# Patient Record
Sex: Male | Born: 1935 | Race: Black or African American | Hispanic: No | Marital: Married | Smoking: Former smoker
Health system: Southern US, Community
[De-identification: ages and names within clinical notes are randomized; demographics above are authoritative.]

## PROBLEM LIST (undated history)

## (undated) DIAGNOSIS — K649 Unspecified hemorrhoids: Secondary | ICD-10-CM

## (undated) DIAGNOSIS — Z8679 Personal history of other diseases of the circulatory system: Secondary | ICD-10-CM

## (undated) DIAGNOSIS — I10 Essential (primary) hypertension: Secondary | ICD-10-CM

## (undated) DIAGNOSIS — I480 Paroxysmal atrial fibrillation: Secondary | ICD-10-CM

## (undated) DIAGNOSIS — I499 Cardiac arrhythmia, unspecified: Secondary | ICD-10-CM

## (undated) DIAGNOSIS — N189 Chronic kidney disease, unspecified: Secondary | ICD-10-CM

## (undated) DIAGNOSIS — K219 Gastro-esophageal reflux disease without esophagitis: Secondary | ICD-10-CM

## (undated) DIAGNOSIS — E785 Hyperlipidemia, unspecified: Secondary | ICD-10-CM

## (undated) DIAGNOSIS — N400 Enlarged prostate without lower urinary tract symptoms: Secondary | ICD-10-CM

## (undated) DIAGNOSIS — F0390 Unspecified dementia without behavioral disturbance: Secondary | ICD-10-CM

## (undated) HISTORY — DX: Unspecified dementia, unspecified severity, without behavioral disturbance, psychotic disturbance, mood disturbance, and anxiety: F03.90

## (undated) HISTORY — DX: Paroxysmal atrial fibrillation: I48.0

## (undated) HISTORY — PX: SPINE SURGERY: SHX786

## (undated) HISTORY — PX: CERVICAL SPINE SURGERY: SHX589

## (undated) HISTORY — DX: Gastro-esophageal reflux disease without esophagitis: K21.9

## (undated) HISTORY — PX: BACK SURGERY: SHX140

## (undated) HISTORY — DX: Unspecified hemorrhoids: K64.9

## (undated) HISTORY — DX: Benign prostatic hyperplasia without lower urinary tract symptoms: N40.0

## (undated) HISTORY — DX: Hyperlipidemia, unspecified: E78.5

---

## 2001-09-17 ENCOUNTER — Encounter: Admission: RE | Admit: 2001-09-17 | Discharge: 2001-09-17 | Payer: Self-pay | Admitting: Neurosurgery

## 2001-09-17 ENCOUNTER — Encounter: Payer: Self-pay | Admitting: Neurosurgery

## 2008-02-25 ENCOUNTER — Ambulatory Visit (HOSPITAL_BASED_OUTPATIENT_CLINIC_OR_DEPARTMENT_OTHER): Admission: RE | Admit: 2008-02-25 | Discharge: 2008-02-25 | Payer: Self-pay | Admitting: Internal Medicine

## 2009-04-14 ENCOUNTER — Encounter: Payer: Self-pay | Admitting: Internal Medicine

## 2009-04-29 ENCOUNTER — Ambulatory Visit: Payer: Self-pay | Admitting: Internal Medicine

## 2009-04-29 DIAGNOSIS — I10 Essential (primary) hypertension: Secondary | ICD-10-CM | POA: Insufficient documentation

## 2009-04-29 DIAGNOSIS — R071 Chest pain on breathing: Secondary | ICD-10-CM | POA: Insufficient documentation

## 2009-04-29 DIAGNOSIS — D72819 Decreased white blood cell count, unspecified: Secondary | ICD-10-CM | POA: Insufficient documentation

## 2009-04-29 DIAGNOSIS — I2510 Atherosclerotic heart disease of native coronary artery without angina pectoris: Secondary | ICD-10-CM | POA: Insufficient documentation

## 2009-04-29 DIAGNOSIS — Z87891 Personal history of nicotine dependence: Secondary | ICD-10-CM | POA: Insufficient documentation

## 2009-04-29 LAB — CONVERTED CEMR LAB
BUN: 10 mg/dL (ref 6–23)
Basophils Absolute: 0 10*3/uL (ref 0.0–0.1)
Basophils Relative: 0.9 % (ref 0.0–3.0)
CO2: 30 meq/L (ref 19–32)
Calcium: 9 mg/dL (ref 8.4–10.5)
Chloride: 106 meq/L (ref 96–112)
Creatinine, Ser: 1.3 mg/dL (ref 0.4–1.5)
Eosinophils Absolute: 0 10*3/uL (ref 0.0–0.7)
Eosinophils Relative: 0.7 % (ref 0.0–5.0)
GFR calc non Af Amer: 67.73 mL/min (ref >60)
Glucose, Bld: 99 mg/dL (ref 70–99)
HCT: 38.9 % — ABNORMAL LOW (ref 39.0–52.0)
Hemoglobin: 13.5 g/dL (ref 13.0–17.0)
Lymphocytes Relative: 31.6 % (ref 12.0–46.0)
Lymphs Abs: 1.2 10*3/uL (ref 0.7–4.0)
MCHC: 34.7 g/dL (ref 30.0–36.0)
MCV: 93.4 fL (ref 78.0–100.0)
Monocytes Absolute: 0.3 10*3/uL (ref 0.1–1.0)
Monocytes Relative: 7.3 % (ref 3.0–12.0)
Neutro Abs: 2.2 10*3/uL (ref 1.4–7.7)
Neutrophils Relative %: 59.5 % (ref 43.0–77.0)
Platelets: 186 10*3/uL (ref 150.0–400.0)
Potassium: 4.1 meq/L (ref 3.5–5.1)
RBC: 4.17 M/uL — ABNORMAL LOW (ref 4.22–5.81)
RDW: 13.5 % (ref 11.5–14.6)
Sodium: 143 meq/L (ref 135–145)
WBC: 3.7 10*3/uL — ABNORMAL LOW (ref 4.5–10.5)

## 2009-04-30 ENCOUNTER — Ambulatory Visit: Payer: Self-pay | Admitting: Radiology

## 2009-04-30 ENCOUNTER — Ambulatory Visit (HOSPITAL_BASED_OUTPATIENT_CLINIC_OR_DEPARTMENT_OTHER): Admission: RE | Admit: 2009-04-30 | Discharge: 2009-04-30 | Payer: Self-pay | Admitting: Internal Medicine

## 2009-05-04 ENCOUNTER — Telehealth: Payer: Self-pay | Admitting: Internal Medicine

## 2009-05-05 ENCOUNTER — Ambulatory Visit: Payer: Self-pay | Admitting: Pulmonary Disease

## 2009-05-12 ENCOUNTER — Ambulatory Visit: Payer: Self-pay | Admitting: Internal Medicine

## 2009-06-29 ENCOUNTER — Telehealth: Payer: Self-pay | Admitting: Pulmonary Disease

## 2010-02-24 ENCOUNTER — Ambulatory Visit: Payer: Self-pay | Admitting: Radiology

## 2010-02-24 ENCOUNTER — Ambulatory Visit (HOSPITAL_BASED_OUTPATIENT_CLINIC_OR_DEPARTMENT_OTHER)
Admission: RE | Admit: 2010-02-24 | Discharge: 2010-02-24 | Payer: Self-pay | Source: Home / Self Care | Admitting: Internal Medicine

## 2010-06-22 NOTE — Miscellaneous (Signed)
Summary: Orders Update pft charges  Clinical Lists Changes  Orders: Added new Service order of Carbon Monoxide diffusing w/capacity ([id number]) - Signed Added new Service order of Lung Volumes (94240) - Signed Added new Service order of Spirometry (Pre & Post) (94060) - Signed 

## 2010-06-22 NOTE — Assessment & Plan Note (Signed)
Summary: eval for pleurisy/apc   Visit Type:  Initial Consult Copy to:  Dr. Vista Lawman Primary Provider/Referring Provider:  Dr. Benito Mccreedy  CC:  Pulmonary Consult for pleurisy.  Pt c/o pain on left side of chest and in center of chest x 3 weeks. Wayne Fuller  History of Present Illness: IOV 04/29/2009: Ex 50 pack smoker. c/o Chest pain. Started 3 weeks ago. Insidious onset.  New chest pain.  Left side. Infr-axillary, precordial and radiates to sternum and can move to Rt side. On and off pain with frequent 'surges'. At its worst it is is 9 of 10 in severity.  Burning in nature. Insidious onset. Relieved by NSAID etodolac. Worsened by no specific factor. Can occur at rest. Not related to exertion. Reports associated  thick occassional mucus for  1 year. Denies associated nausea, vomit, diarrhea, syncope, hemoptysis, edema, dyspnea. Also, denies cough other than rare occassional cough and wheeze that is present for unspecified time. Saw Dr Bernadette Hoit 04/14/2009 and 04/21/2009. Per review of outside records Troponin normal, patient refussed admission, WC 3.3K but other tests normal. Also, Echart review shows that he had  non contrast CT chest in Oct 2009 for rt pleuritic chest pain; this was normal. Patient does not recollect this.  NOTE: significant discrepancy between his past hx and wife's recollection of his past hx. Unclear who is accurate.   Current Medications (verified): 1)  Metoprolol Tartrate 100 Mg Tabs (Metoprolol Tartrate) .... Take 1 Tablet By Mouth Two Times A Day 2)  Hydrochlorothiazide 25 Mg Tabs (Hydrochlorothiazide) .... Take 1 Tablet By Mouth Once A Day 3)  Gabapentin 100 Mg Caps (Gabapentin) .... Take 1 Tablet By Mouth Once A Day 4)  Flomax 0.4 Mg Caps (Tamsulosin Hcl) .... Take 1 Tablet By Mouth Once A Day 5)  Aspir-Low 81 Mg Tbec (Aspirin) .... Take 1 Tablet By Mouth Once A Day 6)  Prevacid 30 Mg Cpdr (Lansoprazole) .... Take 1 Tablet By Mouth Once A Day 7)  Folic Acid Q000111Q Mcg  Tabs (Folic Acid) .... Take 1 Tablet By Mouth Once A Day 8)  Ketoprofen 75 Mg Caps (Ketoprofen) .... Take 1 Tablet By Mouth Three Times A Day 9)  Etodolac 400 Mg Tabs (Etodolac) .... Take 1 Tablet By Mouth Two Times A Day 10)  Clindamycin Hcl 300 Mg Caps (Clindamycin Hcl) .... Take 2 Caps One Hour Before Dental Appt 11)  Tamsulosin Hcl 0.4 Mg Caps (Tamsulosin Hcl) .... Take 1 Tablet By Mouth Once A Day  Allergies (verified): No Known Drug Allergies  Past History:  Family History: Last updated: 04/29/2009 Motherhypertension, stroke Father-hypertension   Social History: Last updated: 04/29/2009 Starte dsmoking in 1944, quit in 1995 avg 1 ppd.  ETOH, stopped 20 + years ago Married Retired Wife aged 32 years. 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 Wife does not recollect stress test but patient says he has had 3 cath including [facility], [facility] #Hypertension #Degenerative Disc Disease > Chronic low back ache with antalgic gait #Dysphagia #Speech problems after neck surgery x 4 years ago #Cardiac Murmur # Hx of PE per patient at [facility] in 1990s.  Wife does not recollect this #baseline creat 1.26  Labs: 04/20/09 done at Dr. Vista Lawman office: Troponin I 0.03, WBC 3.3, HGB 13.5, MCV 90.1, Platelet 179, creatinine 1.26, total protein 6.8, albumin 4.4, calcium 9.3  Past Surgical History: NECK SURGERY X  4 surgeries on back per patient  Family History: Motherhypertension, stroke Father-hypertension   Social History: Starte dsmoking in 1944, quit in 1995 avg 1 ppd.  ETOH, stopped 20 + years ago Married Retired Wife aged 69 years. 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  The patient denies shortness of breath with activity, shortness of  breath at rest, productive cough, coughing up blood, irregular heartbeats, acid heartburn, indigestion, loss of appetite, weight change, abdominal pain, difficulty swallowing, sore throat, headaches, nasal congestion/difficulty breathing through nose, sneezing, itching, ear ache, anxiety, depression, hand/feet swelling, joint stiffness or pain, rash, change in color of mucus, and fever.         did not desaturate in office with walking  Vital Signs:  Patient profile:   77 year old male Height:      70 inches Weight:      183.50 pounds BMI:     26.42 O2 Sat:      97 % on Room air Temp:     98.8 degrees F oral Pulse rate:   62 / minute BP sitting:   138 / 70  (right arm) Cuff size:   regular  Vitals Entered By: Lyman Bing CMA (April 29, 2009 2:17 PM)  O2 Flow:  Room air  Serial Vital Signs/Assessments:  Comments: Ambulatory Pulse Oximetry  Resting; HR__70___    02 Sat__95___  Lap1 (185 feet)   HR__75__   02 Sat__98___ Lap2 (185 feet)   HR___78__   02 Sat__99___    Lap3 (185 feet)   HR___76_   02 Sat__99___  _x__Test Completed without Difficulty ___Test Stopped due to:   By: Morris Bing CMA   CC: Pulmonary Consult for pleurisy.  Pt c/o pain on left side of chest and in center of chest x 3 weeks.  Comments Medications reviewed with patient [place] Bing CMA  April 29, 2009 2:25 PM    Physical Exam  General:  well developed, well nourished, in no acute distress. thin.   Head:  normocephalic and atraumatic Eyes:  PERRLA/EOM intact; conjunctiva and sclera clear Ears:  TMs intact and clear with normal canals Nose:  no deformity, discharge, inflammation, or lesions Mouth:  nearly edentulous Neck:  no masses, thyromegaly, or abnormal cervical nodes. Scar on back of neck Chest Wall:  no deformities noted no chest wall tenderness Lungs:  clear bilaterally to auscultation and percussion Heart:  regular rate and rhythm, S1, S2 without murmurs, rubs,  gallops, or clicks Abdomen:  bowel sounds positive; abdomen soft and non-tender without masses, or organomegaly Msk:  no deformity or scoliosis noted with normal posture scar present back of neck Pulses:  pulses normal Extremities:  no clubbing, cyanosis, edema, or deformity noted Neurologic:  CN II-XII grossly intact with normal reflexes, coordination, muscle strength and tone Skin:  intact without lesions or rashes Cervical Nodes:  no significant adenopathy Axillary Nodes:  no significant adenopathy Psych:  alert and cooperative; normal mood and affect; normal attention span and concentration   CT of Chest  Procedure date:  02/25/2008  Findings:      Clinical Data: Persistent cough.  Recurrent right-sided pleuritic   pain.    CT CHEST WITHOUT CONTRAST    Technique:  Multidetector CT imaging of the chest was performed   following the standard protocol without IV contrast.    Comparison: None  Findings: Lung windows demonstrate  Minimal scar atelectasis in the   superior segment right lower lobe on image 32.  Similar findings in   the left lower lobe.    Soft tissue windows demonstrate Normal heart size without   pericardial or pleural effusion.  Small mediastinal lymph nodes. No   mediastinal or hilar adenopathy.    Limited abdominal imaging demonstrates scattered hypoattenuating   liver lesions which are likely cysts.  Mildly prominent porta   hepatis lymph nodes which are likely reactive.  lower cervical   spine fixation.    IMPRESSION:    1.  No acute process or explanation for patient's symptoms within   the chest.    Read By:  Areta Haber,  M.D.   Released By:  Areta Haber,  M.D.  Additional Information  Comments:      personally reviewed and agree  MISC. Report  Procedure date:  04/20/2008  Findings:      abs: 04/20/09 done at Dr. Vista Lawman office: Troponin I 0.03, WBC 3.3, HGB 13.5, MCV 90.1, Platelet 179, creatinine 1.26, total protein 6.8,  albumin 4.4, calcium 9.3  Impression & Recommendations:  Problem # 1:  CHEST PAIN, PLEURITIC (ICD-786.52) Assessment New Unclear cause. REports hx of prior PE (? true) in 1990s. Also, hx of Rt pleurisy in 2009 which he does not remember. Also, ex heavy smoker  Did not desaturate with exertion in office which is reassuring  PLAN Best to get CT angio chest to look for recurrent PE and lung mass Followoup at [facility] Dr Elsworth Soho which is closer to his house  His updated medication list for this problem includes:    Aspir-low 81 Mg Tbec (Aspirin) .Wayne Fuller... Take 1 tablet by mouth once a day    Ketoprofen 75 Mg Caps (Ketoprofen) .Wayne Fuller... Take 1 tablet by mouth three times a day    Etodolac 400 Mg Tabs (Etodolac) .Wayne Fuller... Take 1 tablet by mouth two times a day  Orders: Radiology Referral (Radiology) Consultation Level V [phone]) TLB-BMP (Basic Metabolic Panel-BMET) (99991111) TLB-CBC Platelet - w/Differential (85025-CBCD)  Problem # 2:  TOBACCO ABUSE, HX OF (ICD-V15.82) Assessment: New prior 50 pack smoker. Some chronic wheeze and cough. Likely copd  plan full pft followup Dr. Elsworth Soho Orders: Radiology Referral (Radiology) Consultation Level V [phone]) TLB-CBC Platelet - w/Differential (85025-CBCD) Pulmonary Referral (Pulmonary)  Problem # 3:  LEUKOPENIA, MILD (ICD-288.50) Assessment: New WC 3.3k on 04/21/2009 at PMD office  plan repeat  Medications Added to Medication List This Visit: 1)  Metoprolol Tartrate 100 Mg Tabs (Metoprolol tartrate) .... Take 1 tablet by mouth two times a day 2)  Hydrochlorothiazide 25 Mg Tabs (Hydrochlorothiazide) .... Take 1 tablet by mouth once a day 3)  Gabapentin 100 Mg Caps (Gabapentin) .... Take 1 tablet by mouth once a day 4)  Flomax 0.4 Mg Caps (Tamsulosin hcl) .... Take 1 tablet by mouth once a day 5)  Aspir-low 81 Mg Tbec (Aspirin) .... Take 1 tablet by mouth once a day 6)  Prevacid 30 Mg Cpdr (Lansoprazole) .... Take 1 tablet by mouth once  a day 7)  Folic Acid Q000111Q Mcg Tabs (Folic acid) .... Take 1 tablet by mouth once a day 8)  Ketoprofen 75 Mg Caps (Ketoprofen) .... Take 1 tablet by mouth three times a day 9)  Etodolac 400 Mg Tabs (Etodolac) .... Take 1 tablet by mouth two times a day 10)  Clindamycin Hcl 300 Mg Caps (Clindamycin hcl) .... Take 2 caps  one hour before dental appt 11)  Tamsulosin Hcl 0.4 Mg Caps (Tamsulosin hcl) .... Take 1 tablet by mouth once a day  Patient Instructions: 1)  Have CT angiogram chest  2)  Have full PFTs 3)  After this see DR. Elsworth Soho at [facility] - hopefully 05/05/2009

## 2010-06-22 NOTE — Procedures (Signed)
Summary: Spirometry [facility]  Spirometry [facility]   Imported By: Rise Patience 07/06/2009 17:06:34  _____________________________________________________________________  External Attachment:    Type:   Image     Comment:   External Document

## 2010-06-22 NOTE — Progress Notes (Signed)
Summary: PFTs  Phone Note Outgoing Call   Summary of Call: Rakesh, PFTs are normal in my oopiniuon. I am looking at it only now. It is from 12/21.  Iwill leave it in your inbox Initial call taken by: Brand Males MD,  June 29, 2009 4:53 PM

## 2010-06-22 NOTE — Assessment & Plan Note (Signed)
Summary: rov/ok per mr, jc/apc   Visit Type:  Follow-up Copy to:  Dr. Vista Lawman Primary Provider/Referring Provider:  Dr. Benito Mccreedy  CC:  Pt here for follow up and pt of MR. Pt c/o intermittent left side burning pain x 2 weeks.  History of Present Illness: 85/M , Ex 50 pack smoker. with atypical chest pain. Started mid Nov. Insidious onset.  Not related to exertion. Reports associated  thick occassional mucus for  1 year. c/o  rare occassional cough and wheeze that is present for unspecified time. Saw Dr Bernadette Hoit 04/14/2009 and 04/21/2009. Per review of outside records Troponin normal, patient refussed admission, WC 3.3K but other tests normal. Non contrast CT chest in Oct 2009 for rt pleuritic chest pain; this was normal.  May 05, 2009 10:05 PM  pain improved , less often now - non exertional. seems to originate in his back & radiate forward. no rash or vertebral fracture on radiological review CT angio reuslts reviewed -  neg  Current Medications (verified): 1)  Metoprolol Tartrate 100 Mg Tabs (Metoprolol Tartrate) .... Take 1 Tablet By Mouth Two Times A Day 2)  Hydrochlorothiazide 25 Mg Tabs (Hydrochlorothiazide) .... Take 1 Tablet By Mouth Once A Day 3)  Gabapentin 100 Mg Caps (Gabapentin) .... Take 1 Tablet By Mouth Once A Day 4)  Flomax 0.4 Mg Caps (Tamsulosin Hcl) .... Take 1 Tablet By Mouth Once A Day 5)  Aspir-Low 81 Mg Tbec (Aspirin) .... Take 1 Tablet By Mouth Once A Day 6)  Prevacid 30 Mg Cpdr (Lansoprazole) .... Take 1 Tablet By Mouth Once A Day 7)  Folic Acid Q000111Q Mcg Tabs (Folic Acid) .... Take 1 Tablet By Mouth Once A Day 8)  Ketoprofen 75 Mg Caps (Ketoprofen) .... Take 1 Tablet By Mouth Three Times A Day 9)  Etodolac 400 Mg Tabs (Etodolac) .... Take 1 Tablet By Mouth Two Times A Day 10)  Clindamycin Hcl 300 Mg Caps (Clindamycin Hcl) .... Take 2 Caps One Hour Before Dental Appt  Allergies (verified): No Known Drug Allergies  Past History:  Past Medical  History: Last updated: 04/29/2009 #Coronary Heart Disease > Wife says it is angina but never had mi. Wife does not recollect stress test but patient says he has had 3 cath including [facility] #Hypertension #Degenerative Disc Disease > Chronic low back ache with antalgic gait #Dysphagia #Speech problems after neck surgery x 4 years ago #Cardiac Murmur # Hx of PE per patient at [facility] in 1990s.  Wife does not recollect this #baseline creat 1.26  Labs: 04/20/09 done at Dr. Vista Lawman office: Troponin I 0.03, WBC 3.3, HGB 13.5, MCV 90.1, Platelet 179, creatinine 1.26, total protein 6.8, albumin 4.4, calcium 9.3  Social History: Last updated: 04/29/2009 Starte dsmoking in 1944, quit in 1995 avg 1 ppd.  ETOH, stopped 20 + years ago Married Retired Wife aged 57 years. 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    Vital Signs:  Patient profile:   77 year old male Height:      70 inches Weight:      183 pounds O2 Sat:      97 % on Room air Temp:  98.3 degrees F oral Pulse rate:   68 / minute BP sitting:   180 / 72  (left arm) Cuff size:   regular  Vitals Entered By: Iran Planas CMA (May 05, 2009 2:24 PM)  O2 Flow:  Room air CC: Pt here for follow up, pt of MR. Pt c/o intermittent left side burning pain x 2 weeks Comments Medications reviewed with patient Iran Planas [facility]  May 05, 2009 2:24 PM    Physical Exam  Additional Exam:  .   CT of Chest  Procedure date:  04/30/2009  Findings:      IMPRESSION:   1.  No  pulmonary emboli. 2.  The lungs are clear. 3.  No other acute or significant findings.  Impression & Recommendations:  Problem # 1:  CHEST PAIN, PLEURITIC (ICD-786.52)  Non pulmonary, non cardiac cause of chest pain. ?neuropathic  - increase neurontin, no rash Defer need for further cardiac evaluation to PMD His updated medication list for this problem includes:    Aspir-low 81 Mg Tbec (Aspirin) .Marland Kitchen... Take 1 tablet by mouth once a day    Ketoprofen 75 Mg Caps (Ketoprofen) .Marland Kitchen... Take 1 tablet by mouth three times a day    Etodolac 400 Mg Tabs (Etodolac) .Marland Kitchen... Take 1 tablet by mouth two times a day  Orders: Est. Patient Level III [id number]  Patient Instructions: 1)  Please schedule a follow-up appointment in 1 month. 2)  Breathing test on 12/21 has been arranged  3)  Increase neurontin three times a day  4)  Continue pain meds as needed  5)  Call us if rash appears on chest - may indicate shingles. 6)  Copy sent [id number]

## 2010-06-22 NOTE — Letter (Signed)
Summary: Dr.George Osei-Bonsu, M.D.  Colleen Can, M.D.   Imported By: Phillis Knack 05/01/2009 13:53:29  _____________________________________________________________________  External Attachment:    Type:   Image     Comment:   External Document

## 2010-06-22 NOTE — Progress Notes (Signed)
Summary: returned call  Phone Note Call from Patient Call back at Home Phone [phone]   Caller: Patient Call For: ramaswamy Summary of Call: pt returned call from jennifer/ results Initial call taken by: Cooper Render,  May 04, 2009 4:28 PM  Follow-up for Phone Call        pt advised of CT results and reminded of appt with Dr. Elsworth Soho today in [place]. pt aware.[place] Bing CMA  May 05, 2009 9:38 AM

## 2011-09-30 ENCOUNTER — Emergency Department (HOSPITAL_BASED_OUTPATIENT_CLINIC_OR_DEPARTMENT_OTHER)
Admission: EM | Admit: 2011-09-30 | Discharge: 2011-09-30 | Disposition: A | Discharge status: Home / Self Care | Payer: Medicare Other | Attending: Emergency Medicine | Admitting: Emergency Medicine

## 2011-09-30 ENCOUNTER — Emergency Department (INDEPENDENT_AMBULATORY_CARE_PROVIDER_SITE_OTHER): Payer: Medicare Other

## 2011-09-30 ENCOUNTER — Encounter (HOSPITAL_BASED_OUTPATIENT_CLINIC_OR_DEPARTMENT_OTHER): Payer: Self-pay | Admitting: *Deleted

## 2011-09-30 DIAGNOSIS — R410 Disorientation, unspecified: Secondary | ICD-10-CM

## 2011-09-30 DIAGNOSIS — F29 Unspecified psychosis not due to a substance or known physiological condition: Secondary | ICD-10-CM

## 2011-09-30 DIAGNOSIS — R079 Chest pain, unspecified: Secondary | ICD-10-CM | POA: Insufficient documentation

## 2011-09-30 DIAGNOSIS — I10 Essential (primary) hypertension: Secondary | ICD-10-CM | POA: Insufficient documentation

## 2011-09-30 DIAGNOSIS — R9431 Abnormal electrocardiogram [ECG] [EKG]: Secondary | ICD-10-CM

## 2011-09-30 DIAGNOSIS — G319 Degenerative disease of nervous system, unspecified: Secondary | ICD-10-CM | POA: Insufficient documentation

## 2011-09-30 DIAGNOSIS — Z77098 Contact with and (suspected) exposure to other hazardous, chiefly nonmedicinal, chemicals: Secondary | ICD-10-CM | POA: Insufficient documentation

## 2011-09-30 HISTORY — DX: Essential (primary) hypertension: I10

## 2011-09-30 LAB — COMPREHENSIVE METABOLIC PANEL
ALT: 9 U/L (ref 0–53)
AST: 18 U/L (ref 0–37)
Albumin: 3.7 g/dL (ref 3.5–5.2)
Alkaline Phosphatase: 80 U/L (ref 39–117)
BUN: 11 mg/dL (ref 6–23)
CO2: 26 mEq/L (ref 19–32)
Calcium: 8.8 mg/dL (ref 8.4–10.5)
Chloride: 105 mEq/L (ref 96–112)
Creatinine, Ser: 1.1 mg/dL (ref 0.50–1.35)
GFR calc Af Amer: 69 mL/min — ABNORMAL LOW (ref >90)
GFR calc non Af Amer: 59 mL/min — ABNORMAL LOW (ref >90)
Glucose, Bld: 89 mg/dL (ref 70–99)
Potassium: 3.5 mEq/L (ref 3.5–5.1)
Sodium: 141 mEq/L (ref 135–145)
Total Bilirubin: 0.3 mg/dL (ref 0.3–1.2)
Total Protein: 6.8 g/dL (ref 6.0–8.3)

## 2011-09-30 LAB — DIFFERENTIAL
Basophils Absolute: 0 10*3/uL (ref 0.0–0.1)
Basophils Relative: 1 % (ref 0–1)
Eosinophils Absolute: 0 10*3/uL (ref 0.0–0.7)
Eosinophils Relative: 1 % (ref 0–5)
Lymphocytes Relative: 43 % (ref 12–46)
Lymphs Abs: 1.8 10*3/uL (ref 0.7–4.0)
Monocytes Absolute: 0.4 10*3/uL (ref 0.1–1.0)
Monocytes Relative: 10 % (ref 3–12)
Neutro Abs: 1.9 10*3/uL (ref 1.7–7.7)
Neutrophils Relative %: 46 % (ref 43–77)

## 2011-09-30 LAB — URINALYSIS, ROUTINE W REFLEX MICROSCOPIC
Bilirubin Urine: NEGATIVE
Glucose, UA: NEGATIVE mg/dL
Hgb urine dipstick: NEGATIVE
Ketones, ur: NEGATIVE mg/dL
Leukocytes, UA: NEGATIVE
Nitrite: NEGATIVE
Protein, ur: NEGATIVE mg/dL
Specific Gravity, Urine: 1.008 (ref 1.005–1.030)
Urobilinogen, UA: 0.2 mg/dL (ref 0.0–1.0)
pH: 5.5 (ref 5.0–8.0)

## 2011-09-30 LAB — TROPONIN I: Troponin I: <0.3 ng/mL (ref <0.30)

## 2011-09-30 LAB — RAPID URINE DRUG SCREEN, HOSP PERFORMED
Amphetamines: NOT DETECTED
Barbiturates: NOT DETECTED
Benzodiazepines: NOT DETECTED
Cocaine: NOT DETECTED
Opiates: NOT DETECTED
Tetrahydrocannabinol: NOT DETECTED

## 2011-09-30 LAB — CBC
HCT: 38.3 % — ABNORMAL LOW (ref 39.0–52.0)
Hemoglobin: 13.5 g/dL (ref 13.0–17.0)
MCH: 32 pg (ref 26.0–34.0)
MCHC: 35.2 g/dL (ref 30.0–36.0)
MCV: 90.8 fL (ref 78.0–100.0)
Platelets: 184 10*3/uL (ref 150–400)
RBC: 4.22 MIL/uL (ref 4.22–5.81)
RDW: 13.1 % (ref 11.5–15.5)
WBC: 4.2 10*3/uL (ref 4.0–10.5)

## 2011-09-30 LAB — ETHANOL: Alcohol, Ethyl (B): <11 mg/dL (ref 0–11)

## 2011-09-30 NOTE — ED Notes (Signed)
States he thinks he has been inhaling vapors from his heating system for the past 6 months. His lungs hurt. Had a cxr in his MD's office 2 weeks ago but does not know the results. He drove himself here from his home in [place]. 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 Cardiac monitor NSR with PAC's. Pt is confused. He thinks the year is 38 but laughs when told it is 2013. States he knew that but has been in the habit of saying 1900 for more years than 2000.

## 2011-09-30 NOTE — ED Provider Notes (Signed)
History     CSN: [id number]  Arrival date & time 09/30/11  1651   First MD Initiated Contact with Patient 09/30/11 1658      Chief Complaint  Patient presents with  . Chemical Exposure    (Consider location/radiation/quality/duration/timing/severity/associated sxs/prior treatment) HPI Patient states patient states that he has been inhaling papers from his heating system for least 6 months. He states that his lungs have been hurting for this entire 6 months. He had chest x-Caelin Rayl in his 41 office last week but does not have the results. He is unable to say specifically why he came in to the emergency department today. He states he is generally weak. He is unable to specify anything further. He states that his wife is at home and if she is unable to cook for her herself. He states he was out running errands today and decide to come here. He denies any current pain. He denies any head injury or headache, current chest pain, current dyspnea, nausea vomiting or diarrhea.  Past Medical History  Diagnosis Date  . Hypertension     Past Surgical History  Procedure Date  . Back surgery     No family history on file.  History  Substance Use Topics  . Smoking status: Never Smoker   . Smokeless tobacco: Not on file  . Alcohol Use: No      Review of Systems  All other systems reviewed and are negative.    Allergies  Review of patient's allergies indicates no known allergies.  Home Medications   Current Outpatient Rx  Name Route Sig Dispense Refill  . ASPIRIN 81 MG PO TABS Oral Take 81 mg by mouth daily.    Marland Kitchen VITAMIN D 1000 UNITS PO TABS Oral Take 1,000 Units by mouth daily.      BP 150/75  Pulse 74  Temp(Src) 99 F (37.2 C) (Rectal)  SpO2 100%  Physical Exam  Nursing note and vitals reviewed. Constitutional: He appears well-developed and well-nourished.  HENT:  Head: Normocephalic and atraumatic.  Right Ear: External ear normal.  Left Ear: External ear  normal.  Mouth/Throat: Oropharynx is clear and moist.  Eyes: Conjunctivae are normal. Pupils are equal, round, and reactive to light.  Neck: Normal range of motion. Neck supple.  Cardiovascular: Normal rate, regular rhythm and normal heart sounds.   Pulmonary/Chest: Effort normal and breath sounds normal.  Abdominal: Soft. Bowel sounds are normal.  Musculoskeletal: Normal range of motion.  Neurological: He is alert.       Patient oriented to person and place but initially thinks year is 26 - patient seems slightly confused but is correctable.  Skin: Skin is warm and dry.  Psychiatric: He has a normal mood and affect.    ED Course  Procedures (including critical care time)  Labs Reviewed  CBC - Abnormal; Notable for the following:    HCT 38.3 (*)    All other components within normal limits  COMPREHENSIVE METABOLIC PANEL - Abnormal; Notable for the following:    GFR calc non Af Amer 59 (*)    GFR calc Af Amer 69 (*)    All other components within normal limits  DIFFERENTIAL  ETHANOL  URINE RAPID DRUG SCREEN (HOSP PERFORMED)  URINALYSIS, ROUTINE W REFLEX MICROSCOPIC  TROPONIN I   Dg Chest 2 View  09/30/2011  *RADIOLOGY REPORT*  Clinical Data: Chest pain.  CHEST - 2 VIEW  Comparison: CT chest 04/30/2009.  Findings: Lungs are clear.  Heart size is normal.  No pneumothorax or pleural effusion.  Postoperative change cervical spine noted.  IMPRESSION: No acute disease.  Original Report Authenticated By: Arvid Right. Luther Parody, M.D.   Ct Head Wo Contrast  09/30/2011  *RADIOLOGY REPORT*  Clinical Data: Confusion  CT HEAD WITHOUT CONTRAST  Technique:  Contiguous axial images were obtained from the base of the skull through the vertex without contrast.  Comparison: None  Findings: No skull fracture is noted.  Paranasal sinuses and mastoid air cells are unremarkable.  Mild cerebral atrophy.  No intracranial hemorrhage, mass effect or midline shift.  Mild periventricular and subcortical white  matter decreased attenuation probable due to chronic small vessel ischemic changes.  No acute infarction.  No mass lesion is noted on this unenhanced scan.  IMPRESSION: No acute intracranial abnormality.  Mild cerebral atrophy.  Mild periventricular and subcortical white matter decreased attenuation probable due to chronic small vessel ischemic changes.  Original Report Authenticated By: Lahoma Crocker, M.D.     No diagnosis found.  Results for orders placed during the hospital encounter of 09/30/11  CBC      Component Value Range   WBC 4.2  4.0 - 10.5 (K/uL)   RBC 4.22  4.22 - 5.81 (MIL/uL)   Hemoglobin 13.5  13.0 - 17.0 (g/dL)   HCT 38.3 (*) 39.0 - 52.0 (%)   MCV 90.8  78.0 - 100.0 (fL)   MCH 32.0  26.0 - 34.0 (pg)   MCHC 35.2  30.0 - 36.0 (g/dL)   RDW 13.1  11.5 - 15.5 (%)   Platelets 184  150 - 400 (K/uL)  DIFFERENTIAL      Component Value Range   Neutrophils Relative 46  43 - 77 (%)   Neutro Abs 1.9  1.7 - 7.7 (K/uL)   Lymphocytes Relative 43  12 - 46 (%)   Lymphs Abs 1.8  0.7 - 4.0 (K/uL)   Monocytes Relative 10  3 - 12 (%)   Monocytes Absolute 0.4  0.1 - 1.0 (K/uL)   Eosinophils Relative 1  0 - 5 (%)   Eosinophils Absolute 0.0  0.0 - 0.7 (K/uL)   Basophils Relative 1  0 - 1 (%)   Basophils Absolute 0.0  0.0 - 0.1 (K/uL)  COMPREHENSIVE METABOLIC PANEL      Component Value Range   Sodium 141  135 - 145 (mEq/L)   Potassium 3.5  3.5 - 5.1 (mEq/L)   Chloride 105  96 - 112 (mEq/L)   CO2 26  19 - 32 (mEq/L)   Glucose, Bld 89  70 - 99 (mg/dL)   BUN 11  6 - 23 (mg/dL)   Creatinine, Ser 1.10  0.50 - 1.35 (mg/dL)   Calcium 8.8  8.4 - 10.5 (mg/dL)   Total Protein 6.8  6.0 - 8.3 (g/dL)   Albumin 3.7  3.5 - 5.2 (g/dL)   AST 18  0 - 37 (U/L)   ALT 9  0 - 53 (U/L)   Alkaline Phosphatase 80  39 - 117 (U/L)   Total Bilirubin 0.3  0.3 - 1.2 (mg/dL)   GFR calc non Af Amer 59 (*) >90 (mL/min)   GFR calc Af Amer 69 (*) >90 (mL/min)  ETHANOL      Component Value Range   Alcohol, Ethyl  (B) <11  0 - 11 (mg/dL)  URINE RAPID DRUG SCREEN (HOSP PERFORMED)      Component Value Range   Opiates NONE DETECTED  NONE DETECTED    Cocaine NONE DETECTED  NONE DETECTED  Benzodiazepines NONE DETECTED  NONE DETECTED    Amphetamines NONE DETECTED  NONE DETECTED    Tetrahydrocannabinol NONE DETECTED  NONE DETECTED    Barbiturates NONE DETECTED  NONE DETECTED   URINALYSIS, ROUTINE W REFLEX MICROSCOPIC      Component Value Range   Color, Urine YELLOW  YELLOW    APPearance CLEAR  CLEAR    Specific Gravity, Urine 1.008  1.005 - 1.030    pH 5.5  5.0 - 8.0    Glucose, UA NEGATIVE  NEGATIVE (mg/dL)   Hgb urine dipstick NEGATIVE  NEGATIVE    Bilirubin Urine NEGATIVE  NEGATIVE    Ketones, ur NEGATIVE  NEGATIVE (mg/dL)   Protein, ur NEGATIVE  NEGATIVE (mg/dL)   Urobilinogen, UA 0.2  0.0 - 1.0 (mg/dL)   Nitrite NEGATIVE  NEGATIVE    Leukocytes, UA NEGATIVE  NEGATIVE   TROPONIN I      Component Value Range   Troponin I <0.30  <0.30 (ng/mL)     MDM   Date: 09/30/2011  Rate: 80  Rhythm: normal sinus rhythm  QRS Axis: normal  Intervals: normal  ST/T Wave abnormalities: nonspecific ST changes  Conduction Disutrbances:none  Narrative Interpretation:   Old EKG Reviewed: none available     Lab workup obtained shows no acute abnormalities CBC, cemented, a urinalysis, urine drug screen, or troponin. EKG shows some abnormalities but does not have elevated troponin and denies current chest pain. We called the patient's wife to get a next of kin phone number. She gave [place] his grandsons name Rushie Goltz and I discussed with him that we were somewhat concerned about the patient's confusion. He states that he lives close to the patient will be check on him this weekend. The patient is anxious to leave the department. I did not find any acute medical abnormalities that would require hospitalization. He has no focal neurological deficits. He has a primary care physician and is instructed to follow up  with him closely.     Shaune Pollack, MD 09/30/11 Einar Crow

## 2011-09-30 NOTE — ED Notes (Signed)
Dr Jeanell Sparrow spoke to pts grandson, Charlotte Crumb about his present condition. Pt will be discharged home.

## 2011-09-30 NOTE — Discharge Instructions (Signed)
Please followup with your doctor next week so that he can review these findings. Please return to the emergency department if you have any worsening of symptoms especially if you have any chest pain orders of breath.

## 2012-12-11 ENCOUNTER — Emergency Department (HOSPITAL_BASED_OUTPATIENT_CLINIC_OR_DEPARTMENT_OTHER): Payer: Medicare Other

## 2012-12-11 ENCOUNTER — Emergency Department (HOSPITAL_BASED_OUTPATIENT_CLINIC_OR_DEPARTMENT_OTHER)
Admission: EM | Admit: 2012-12-11 | Discharge: 2012-12-11 | Disposition: A | Discharge status: Home / Self Care | Payer: Medicare Other | Attending: Emergency Medicine | Admitting: Emergency Medicine

## 2012-12-11 ENCOUNTER — Encounter (HOSPITAL_BASED_OUTPATIENT_CLINIC_OR_DEPARTMENT_OTHER): Payer: Self-pay

## 2012-12-11 DIAGNOSIS — I10 Essential (primary) hypertension: Secondary | ICD-10-CM | POA: Insufficient documentation

## 2012-12-11 DIAGNOSIS — Y9389 Activity, other specified: Secondary | ICD-10-CM | POA: Insufficient documentation

## 2012-12-11 DIAGNOSIS — S0990XA Unspecified injury of head, initial encounter: Secondary | ICD-10-CM | POA: Insufficient documentation

## 2012-12-11 DIAGNOSIS — Y92009 Unspecified place in unspecified non-institutional (private) residence as the place of occurrence of the external cause: Secondary | ICD-10-CM | POA: Insufficient documentation

## 2012-12-11 DIAGNOSIS — Z79899 Other long term (current) drug therapy: Secondary | ICD-10-CM | POA: Insufficient documentation

## 2012-12-11 DIAGNOSIS — W19XXXA Unspecified fall, initial encounter: Secondary | ICD-10-CM

## 2012-12-11 DIAGNOSIS — S6990XA Unspecified injury of unspecified wrist, hand and finger(s), initial encounter: Secondary | ICD-10-CM | POA: Insufficient documentation

## 2012-12-11 DIAGNOSIS — Z7982 Long term (current) use of aspirin: Secondary | ICD-10-CM | POA: Insufficient documentation

## 2012-12-11 DIAGNOSIS — S59909A Unspecified injury of unspecified elbow, initial encounter: Secondary | ICD-10-CM | POA: Insufficient documentation

## 2012-12-11 DIAGNOSIS — M25521 Pain in right elbow: Secondary | ICD-10-CM

## 2012-12-11 DIAGNOSIS — W010XXA Fall on same level from slipping, tripping and stumbling without subsequent striking against object, initial encounter: Secondary | ICD-10-CM | POA: Insufficient documentation

## 2012-12-11 NOTE — ED Provider Notes (Signed)
History    CSN: [id number] Arrival date & time 12/11/12  1153  First MD Initiated Contact with Patient 12/11/12 1203     Chief Complaint  Patient presents with  . Fall  . Elbow Injury   (Consider location/radiation/quality/duration/timing/severity/associated sxs/prior Treatment) HPI Comments: Patient is an 79 yo M PMHx significant for HTN presenting to the ED for sharp right elbow pain with radiation to forearm that began this morning after patient tripped on his kitchen rug and fell hitting his elbow on the kitchen ground. The fall was unwitnessed. Patient states he bumped his head on the fall, but denies any LOC or headache. He rates his pain 8/10 without alleviating or aggravating factor. Family member endorses that patient is at baseline. Patient normally ambulates with a cane. Patient denies any CP, SOB, headache, abdominal pain prior to fall.   The history is provided by the patient and a relative.     Past Medical History  Diagnosis Date  . Hypertension    Past Surgical History  Procedure Laterality Date  . Back surgery    . Cervical spine surgery     No family history on file. History  Substance Use Topics  . Smoking status: Never Smoker   . Smokeless tobacco: Not on file  . Alcohol Use: No    Review of Systems  Constitutional: Negative for fever, chills and fatigue.  HENT: Negative for facial swelling, neck pain and neck stiffness.   Eyes: Negative.   Respiratory: Negative for cough and shortness of breath.   Cardiovascular: Negative for chest pain, palpitations and leg swelling.  Gastrointestinal: Negative for nausea, vomiting, abdominal pain and diarrhea.  Genitourinary: Negative.   Musculoskeletal: Positive for myalgias and arthralgias. Negative for joint swelling.  Skin: Negative.   Neurological: Negative for dizziness, syncope, light-headedness and headaches.    Allergies  Review of patient's allergies indicates no known allergies.  Home Medications    Current Outpatient Rx  Name  Route  Sig  Dispense  Refill  . aspirin 81 MG tablet   Oral   Take 81 mg by mouth daily.         . cholecalciferol (VITAMIN D) 1000 UNITS tablet   Oral   Take 1,000 Units by mouth daily.         . hydrochlorothiazide (HYDRODIURIL) 25 MG tablet   Oral   Take 25 mg by mouth daily.         . lansoprazole (PREVACID) 30 MG capsule   Oral   Take 30 mg by mouth daily.         . metoprolol (LOPRESSOR) 100 MG tablet   Oral   Take 100 mg by mouth 2 (two) times daily.         . Tamsulosin HCl (FLOMAX) 0.4 MG CAPS   Oral   Take 0.4 mg by mouth daily after supper.          BP 182/73  Pulse 72  Temp(Src) 97.9 F (36.6 C) (Oral)  Resp 22  Ht 5\' 10"  (1.778 m)  Wt 170 lb (77.111 kg)  BMI 24.39 kg/m2  SpO2 100% Physical Exam  Constitutional: He is oriented to person, place, and time. He appears well-developed and well-nourished. No distress.  HENT:  Head: Normocephalic and atraumatic.  Mouth/Throat: Oropharynx is clear and moist.  Eyes: Conjunctivae and EOM are normal. Pupils are equal, round, and reactive to light.  Neck: Normal range of motion. Neck supple.  Cardiovascular: Normal rate, regular rhythm, normal heart  sounds and intact distal pulses.   Pulmonary/Chest: Effort normal and breath sounds normal. No respiratory distress.  Abdominal: Soft. Bowel sounds are normal. There is no tenderness.  Musculoskeletal:       Right elbow: He exhibits normal range of motion, no deformity and no laceration. Tenderness found.       Right wrist: Normal.       Right hand: Normal.  Neurological: He is alert and oriented to person, place, and time. He has normal strength. No cranial nerve deficit or sensory deficit. Gait normal. GCS eye subscore is 4. GCS verbal subscore is 5. GCS motor subscore is 6.  Skin: Skin is warm and dry. He is not diaphoretic.  Psychiatric: He has a normal mood and affect.    ED Course  Procedures (including critical  care time) Labs Reviewed - No data to display Dg Elbow Complete Right  12/11/2012   *RADIOLOGY REPORT*  Clinical Data: History of recent fall complaining of posterior right-sided elbow pain.  RIGHT ELBOW - COMPLETE 3+ VIEW  Comparison: No priors.  Findings: Five views of the right elbow demonstrate soft tissue swelling posterior to the distal humerus.  No underlying acute displaced fracture is identified.  The elbow joint is located. There is extensive joint space narrowing, subchondral sclerosis, subchondral cyst formation and osteophyte formation, compatible with osteoarthritis.  A small spur extends off the posterior aspect of the olecranon process.  IMPRESSION: 1.  No acute radiographic abnormality of the bones of the right elbow. 2.  Soft tissue swelling posterior to the elbow. 3.  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   Original Report Authenticated By: Vinnie Langton, M.D.   Ct Head Wo Contrast  12/11/2012   *RADIOLOGY REPORT*  Clinical Data: Loss of balance and fall.  CT HEAD WITHOUT CONTRAST  Technique:  Contiguous axial images were obtained from the base of the skull through the vertex without contrast.  Comparison: 09/30/2011  Findings: No evidence for acute hemorrhage, mass lesion, midline shift, hydrocephalus or large infarct.  Again noted is low density in the periventricular and subcortical white matter, particularly in the parietal lobes.  There may be a focal area of encephalomalacia in the right occipital-parietal lobe which is unchanged.  There is mild atrophy.  No acute bony abnormality. Visualized sinuses are clear.  IMPRESSION: Stable head CT.  No acute intracranial abnormality.  Atrophy and evidence for chronic small vessel ischemic changes.   Original Report Authenticated By: Markus Daft, M.D.   1. Fall at home, initial encounter   2. Elbow pain, right     MDM  Pt presenting after mechanical fall. No neurofocal deficits on examination. CT  and X-ray reviewed. Imaging shows no fracture. Directed pt to ice injury, take acetaminophen or ibuprofen for pain, and to elevate and rest the injury when possible. Advised to follow up with his PCP in 1-2 days. Advised to f/u with orthopedist if elbow pain persists in 1 week. Patient d/w with Dr. Stark Jock, agrees with plan. Patient is agreeable to plan. Patient is stable at time of discharge.     [place], PA-C 12/11/12 1640

## 2012-12-11 NOTE — ED Notes (Signed)
Pt reports he lost his balance and fell.  Reports right elbow pain.

## 2012-12-12 NOTE — ED Provider Notes (Signed)
Medical screening examination/treatment/procedure(s) were performed by non-physician practitioner and as supervising physician I was immediately available for consultation/collaboration.  Veryl Speak, MD 12/12/12 [phone]

## 2013-07-25 ENCOUNTER — Emergency Department (HOSPITAL_BASED_OUTPATIENT_CLINIC_OR_DEPARTMENT_OTHER): Payer: Medicare Other

## 2013-07-25 ENCOUNTER — Emergency Department (HOSPITAL_BASED_OUTPATIENT_CLINIC_OR_DEPARTMENT_OTHER)
Admission: EM | Admit: 2013-07-25 | Discharge: 2013-07-25 | Disposition: A | Discharge status: Home / Self Care | Payer: Medicare Other | Attending: Emergency Medicine | Admitting: Emergency Medicine

## 2013-07-25 ENCOUNTER — Encounter (HOSPITAL_BASED_OUTPATIENT_CLINIC_OR_DEPARTMENT_OTHER): Payer: Self-pay | Admitting: Emergency Medicine

## 2013-07-25 DIAGNOSIS — G8929 Other chronic pain: Secondary | ICD-10-CM | POA: Insufficient documentation

## 2013-07-25 DIAGNOSIS — Z7982 Long term (current) use of aspirin: Secondary | ICD-10-CM | POA: Insufficient documentation

## 2013-07-25 DIAGNOSIS — Z9889 Other specified postprocedural states: Secondary | ICD-10-CM | POA: Insufficient documentation

## 2013-07-25 DIAGNOSIS — M545 Low back pain, unspecified: Secondary | ICD-10-CM | POA: Insufficient documentation

## 2013-07-25 DIAGNOSIS — M549 Dorsalgia, unspecified: Secondary | ICD-10-CM

## 2013-07-25 DIAGNOSIS — I10 Essential (primary) hypertension: Secondary | ICD-10-CM | POA: Insufficient documentation

## 2013-07-25 DIAGNOSIS — Z79899 Other long term (current) drug therapy: Secondary | ICD-10-CM | POA: Insufficient documentation

## 2013-07-25 MED ORDER — ACETAMINOPHEN 500 MG PO TABS
1000.0000 mg | ORAL_TABLET | Freq: Once | ORAL | Status: AC
  Administered 2013-07-25: 1000 mg via ORAL
  Filled 2013-07-25: qty 2

## 2013-07-25 MED ORDER — METOPROLOL TARTRATE 50 MG PO TABS
ORAL_TABLET | ORAL | Status: DC
Start: 2013-07-25 — End: 2013-07-25
  Filled 2013-07-25: qty 1

## 2013-07-25 MED ORDER — HYDROCHLOROTHIAZIDE 25 MG PO TABS
25.0000 mg | ORAL_TABLET | Freq: Once | ORAL | Status: AC
  Administered 2013-07-25: 25 mg via ORAL
  Filled 2013-07-25: qty 1

## 2013-07-25 MED ORDER — IBUPROFEN 400 MG PO TABS: 600.0000 mg | ORAL_TABLET | Freq: Once | ORAL | Status: DC

## 2013-07-25 MED ORDER — METOPROLOL TARTRATE 50 MG PO TABS
100.0000 mg | ORAL_TABLET | Freq: Once | ORAL | Status: AC
  Administered 2013-07-25: 100 mg via ORAL
  Filled 2013-07-25: qty 2

## 2013-07-25 NOTE — ED Notes (Signed)
Chronic back pain. Pt states has had this episode for three days. Denies injury.

## 2013-07-25 NOTE — ED Provider Notes (Signed)
CSN: [id number]     Arrival date & time 07/25/13  1539 History   First MD Initiated Contact with Patient 07/25/13 1546     Chief Complaint  Patient presents with  . Back Pain     (Consider location/radiation/quality/duration/timing/severity/associated sxs/prior Treatment) The history is provided by the patient.  Tiyon Roorda is a 80 y.o. male hx of HTN, previous back surgeries here with back pain. He has chronic back pain but has been off his meds for a long time because he states that they make him feel worse. States about 3 days ago he noticed possible slipped disc in his back and since then he had more pain. Denies any fall or trauma. Denies any numbness or tingling or trouble urinating. Denies any control walking or weakness. He also has not been taking his blood pressure medications but denies any headache. Also denies any abdominal pain or vomiting.     Past Medical History  Diagnosis Date  . Hypertension    Past Surgical History  Procedure Laterality Date  . Back surgery    . Cervical spine surgery     History reviewed. No pertinent family history. History  Substance Use Topics  . Smoking status: Never Smoker   . Smokeless tobacco: Not on file  . Alcohol Use: No    Review of Systems  Musculoskeletal: Positive for back pain.  All other systems reviewed and are negative.      Allergies  Review of patient's allergies indicates no known allergies.  Home Medications   Current Outpatient Rx  Name  Route  Sig  Dispense  Refill  . aspirin 81 MG tablet   Oral   Take 81 mg by mouth daily.         . cholecalciferol (VITAMIN D) 1000 UNITS tablet   Oral   Take 1,000 Units by mouth daily.         . hydrochlorothiazide (HYDRODIURIL) 25 MG tablet   Oral   Take 25 mg by mouth daily.         . lansoprazole (PREVACID) 30 MG capsule   Oral   Take 30 mg by mouth daily.         . metoprolol (LOPRESSOR) 100 MG tablet   Oral   Take 100 mg by mouth 2 (two) times  daily.         . Tamsulosin HCl (FLOMAX) 0.4 MG CAPS   Oral   Take 0.4 mg by mouth daily after supper.          BP 170/57  Pulse 64  Temp(Src) 98.2 F (36.8 C) (Oral)  Resp 20  Wt 160 lb (72.576 kg)  SpO2 99% Physical Exam  Nursing note and vitals reviewed. Constitutional: He is oriented to person, place, and time. He appears well-developed.  Slightly uncomfortable   HENT:  Head: Normocephalic.  Mouth/Throat: Oropharynx is clear and moist.  Eyes: Conjunctivae are normal. Pupils are equal, round, and reactive to light.  Neck: Normal range of motion. Neck supple.  Cardiovascular: Normal rate, regular rhythm and normal heart sounds.   Pulmonary/Chest: Effort normal and breath sounds normal. No respiratory distress. He has no wheezes. He has no rales.  Abdominal: Soft. Bowel sounds are normal. He exhibits no distension. There is no tenderness. There is no rebound and no guarding.  Musculoskeletal: Normal range of motion.  ? Lower lumbar tenderness, no obvious stepoff   Neurological: He is alert and oriented to person, place, and time. No cranial nerve deficit.  No saddle anesthesia. Nl gait, nl lower extremity strength   Skin: Skin is warm and dry.  Psychiatric: He has a normal mood and affect. His behavior is normal. Judgment and thought content normal.    ED Course  Procedures (including critical care time) Labs Review Labs Reviewed - No data to display Imaging Review Dg Lumbar Spine Complete  07/25/2013   CLINICAL DATA:  Chronic back pain.  EXAM: LUMBAR SPINE - COMPLETE 4+ VIEW  COMPARISON:  None.  FINDINGS: Mild dextroconvex lumbar scoliosis is present with the apex at L3. Multilevel degenerative disc disease is present. Laminectomy has been performed at L5. Partially visualized right dynamic hip screw. Lumbosacral transitional anatomy. L4-L5 predominant lumbar spondylosis. Vertebral body height is preserved. 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  IMPRESSION: No  acute osseous abnormality. Postoperative changes at L4-L5. Lumbosacral transitional anatomy.   Electronically Signed   By: Dereck Ligas M.D.   On: 07/25/2013 16:28     EKG Interpretation None      MDM   Final diagnoses:  None   Rickye Chai is a 80 y.o. male here with back pain, hypertension. I think likely worsening chronic pain. I doubt dissection or AAA. He refused narcotics and wants tylenol. Will also give his home BP meds and reassess.   5:17 PM Xray showed no fracture. Patient feels better. BP improved with BP meds. No abdominal tenderness or bruit. I think HTN is from uncompliance.     Wandra Arthurs, MD 07/25/13 [phone]

## 2013-07-25 NOTE — Discharge Instructions (Signed)
Take tylenol for pain.   You need to take your blood pressure medicines as prescribed.   Follow up with your doctor regarding your blood pressure and back pain.   Return to ER if you have severe back pain, weakness, numbness, headaches, vomiting.

## 2014-04-03 ENCOUNTER — Emergency Department (HOSPITAL_BASED_OUTPATIENT_CLINIC_OR_DEPARTMENT_OTHER): Payer: Medicare Other

## 2014-04-03 ENCOUNTER — Encounter (HOSPITAL_BASED_OUTPATIENT_CLINIC_OR_DEPARTMENT_OTHER): Payer: Self-pay | Admitting: *Deleted

## 2014-04-03 ENCOUNTER — Observation Stay (HOSPITAL_BASED_OUTPATIENT_CLINIC_OR_DEPARTMENT_OTHER)
Admission: EM | Admit: 2014-04-03 | Discharge: 2014-04-05 | Disposition: A | Discharge status: Home / Self Care | Payer: Medicare Other | Attending: Internal Medicine | Admitting: Internal Medicine

## 2014-04-03 DIAGNOSIS — I10 Essential (primary) hypertension: Secondary | ICD-10-CM | POA: Diagnosis present

## 2014-04-03 DIAGNOSIS — Z7982 Long term (current) use of aspirin: Secondary | ICD-10-CM | POA: Diagnosis not present

## 2014-04-03 DIAGNOSIS — J449 Chronic obstructive pulmonary disease, unspecified: Secondary | ICD-10-CM | POA: Diagnosis not present

## 2014-04-03 DIAGNOSIS — R079 Chest pain, unspecified: Secondary | ICD-10-CM | POA: Diagnosis not present

## 2014-04-03 DIAGNOSIS — R0602 Shortness of breath: Secondary | ICD-10-CM | POA: Insufficient documentation

## 2014-04-03 DIAGNOSIS — Z87891 Personal history of nicotine dependence: Secondary | ICD-10-CM | POA: Diagnosis not present

## 2014-04-03 DIAGNOSIS — N179 Acute kidney failure, unspecified: Secondary | ICD-10-CM | POA: Insufficient documentation

## 2014-04-03 DIAGNOSIS — J209 Acute bronchitis, unspecified: Secondary | ICD-10-CM | POA: Insufficient documentation

## 2014-04-03 DIAGNOSIS — Z79899 Other long term (current) drug therapy: Secondary | ICD-10-CM | POA: Diagnosis not present

## 2014-04-03 LAB — COMPREHENSIVE METABOLIC PANEL
ALT: 12 U/L (ref 0–53)
AST: 23 U/L (ref 0–37)
Albumin: 3.5 g/dL (ref 3.5–5.2)
Alkaline Phosphatase: 84 U/L (ref 39–117)
Anion gap: 11 (ref 5–15)
BUN: 9 mg/dL (ref 6–23)
CO2: 26 mEq/L (ref 19–32)
Calcium: 8.8 mg/dL (ref 8.4–10.5)
Chloride: 107 mEq/L (ref 96–112)
Creatinine, Ser: 1.4 mg/dL — ABNORMAL HIGH (ref 0.50–1.35)
GFR calc Af Amer: 50 mL/min — ABNORMAL LOW (ref >90)
GFR calc non Af Amer: 43 mL/min — ABNORMAL LOW (ref >90)
Glucose, Bld: 96 mg/dL (ref 70–99)
Potassium: 3.9 mEq/L (ref 3.7–5.3)
Sodium: 144 mEq/L (ref 137–147)
Total Bilirubin: 0.4 mg/dL (ref 0.3–1.2)
Total Protein: 6.6 g/dL (ref 6.0–8.3)

## 2014-04-03 LAB — CBC
HCT: 36.1 % — ABNORMAL LOW (ref 39.0–52.0)
Hemoglobin: 12.1 g/dL — ABNORMAL LOW (ref 13.0–17.0)
MCH: 31.6 pg (ref 26.0–34.0)
MCHC: 33.5 g/dL (ref 30.0–36.0)
MCV: 94.3 fL (ref 78.0–100.0)
Platelets: 196 10*3/uL (ref 150–400)
RBC: 3.83 MIL/uL — ABNORMAL LOW (ref 4.22–5.81)
RDW: 13.1 % (ref 11.5–15.5)
WBC: 4 10*3/uL (ref 4.0–10.5)

## 2014-04-03 LAB — TROPONIN I
Troponin I: <0.3 ng/mL (ref <0.30)
Troponin I: <0.3 ng/mL (ref <0.30)

## 2014-04-03 MED ORDER — NITROGLYCERIN 2 % TD OINT
1.0000 [in_us] | TOPICAL_OINTMENT | Freq: Four times a day (QID) | TRANSDERMAL | Status: DC
  Administered 2014-04-03 – 2014-04-05 (×7): 1 [in_us] via TOPICAL
  Filled 2014-04-03: qty 30
  Filled 2014-04-03: qty 1

## 2014-04-03 MED ORDER — ASPIRIN 81 MG PO CHEW
324.0000 mg | CHEWABLE_TABLET | Freq: Once | ORAL | Status: AC
  Administered 2014-04-03: 324 mg via ORAL
  Filled 2014-04-03: qty 4

## 2014-04-03 NOTE — ED Notes (Signed)
Pt c/o CP lasting 15 mins with SOB and dizziness while raking leaves today

## 2014-04-03 NOTE — ED Provider Notes (Signed)
CSN: [id number]     Arrival date & time 04/03/14  1641 History   First MD Initiated Contact with Patient 04/03/14 1846     This chart was scribed for Dorie Rank, MD by Forrestine Him, ED Scribe. This patient was seen in room MH05/MH05 and the patient's care was started 7:04 PM.   Chief Complaint  Patient presents with  . Chest Pain   Patient is a 80 y.o. male presenting with chest pain. The history is provided by the patient and a relative. No language interpreter was used.  Chest Pain Pain quality: pressure   Pain radiates to:  Does not radiate Pain radiates to the back: no   Pain severity:  Moderate Onset quality:  Sudden Duration:  15 minutes Timing:  Intermittent Progression:  Resolved Relieved by:  None tried Worsened by:  Deep breathing Ineffective treatments:  None tried Associated symptoms: shortness of breath   Associated symptoms: no cough and no fever     HPI Comments: Wayne Fuller is a 80 y.o. male with a PMHx of HTN who presents to the Emergency Department complaining of intermittent, moderate chest pain x 15 minutes prior to arrival while working with a leaf blower today. He describes pain as pressure and is exacerbated with deep breathing. He denies any radiating pain at time of discomfort. Pt also reports HA, SOB, and dizziness at onset of chest pain. However, Wayne Fuller states symptoms have now subsided. He denies any fever, chills, or cough. Pt admits to previous symptoms several years ago. No history of cardiology work up or related studies. No known allergies to medications. No other concerns this visit.  Past Medical History  Diagnosis Date  . Hypertension    Past Surgical History  Procedure Laterality Date  . Back surgery    . Cervical spine surgery     History reviewed. No pertinent family history. History  Substance Use Topics  . Smoking status: Never Smoker   . Smokeless tobacco: Not on file  . Alcohol Use: No    Review of Systems  Constitutional:  Negative for fever and chills.  Respiratory: Positive for shortness of breath. Negative for cough.   Cardiovascular: Positive for chest pain.  All other systems reviewed and are negative.     Allergies  Review of patient's allergies indicates no known allergies.  Home Medications   Prior to Admission medications   Medication Sig Start Date End Date Taking? Authorizing Provider  aspirin 81 MG tablet Take 81 mg by mouth daily.    Historical Provider, MD  cholecalciferol (VITAMIN D) 1000 UNITS tablet Take 1,000 Units by mouth daily.    Historical Provider, MD  hydrochlorothiazide (HYDRODIURIL) 25 MG tablet Take 25 mg by mouth daily.    Historical Provider, MD  lansoprazole (PREVACID) 30 MG capsule Take 30 mg by mouth daily.    Historical Provider, MD  metoprolol (LOPRESSOR) 100 MG tablet Take 100 mg by mouth 2 (two) times daily.    Historical Provider, MD  Tamsulosin HCl (FLOMAX) 0.4 MG CAPS Take 0.4 mg by mouth daily after supper.    Historical Provider, MD   Triage Vitals: BP 167/93 mmHg  Pulse 83  Temp(Src) 99.1 F (37.3 C) (Oral)  Resp 16  Wt 160 lb (72.576 kg)  SpO2 96%   Physical Exam  Constitutional: No distress.  Elderly  HENT:  Head: Normocephalic and atraumatic.  Right Ear: External ear normal.  Left Ear: External ear normal.  Eyes: Conjunctivae are normal. Right eye exhibits  no discharge. Left eye exhibits no discharge. No scleral icterus.  Neck: Neck supple. No tracheal deviation present.  Cardiovascular: Normal rate and intact distal pulses.  An irregular rhythm present.  Pulmonary/Chest: Effort normal and breath sounds normal. No stridor. No respiratory distress. He has no wheezes. He has no rales.  Abdominal: Soft. Bowel sounds are normal. He exhibits no distension. There is no tenderness. There is no rebound and no guarding.  Musculoskeletal: He exhibits no edema or tenderness.  Neurological: He is alert. He has normal strength. No cranial nerve deficit (no  facial droop, extraocular movements intact, no slurred speech) or sensory deficit. He exhibits normal muscle tone. He displays no seizure activity. Coordination normal.  Skin: Skin is warm and dry. No rash noted.  Psychiatric: He has a normal mood and affect.  Nursing note and vitals reviewed.   ED Course  Procedures (including critical care time)  DIAGNOSTIC STUDIES: Oxygen Saturation is 96% on RA, Adequate by my interpretation.    COORDINATION OF CARE: 7:09 PM- Will order troponin I, CBC, DG chest 2 view, CMP, and EKG. Will give dose of Nitroglycerin and Aspirin while in ED. Discussed treatment plan with pt at bedside and pt agreed to plan.     Labs Review Labs Reviewed  CBC - Abnormal; Notable for the following:    RBC 3.83 (*)    Hemoglobin 12.1 (*)    HCT 36.1 (*)    All other components within normal limits  COMPREHENSIVE METABOLIC PANEL - Abnormal; Notable for the following:    Creatinine, Ser 1.40 (*)    GFR calc non Af Amer 43 (*)    GFR calc Af Amer 50 (*)    All other components within normal limits  TROPONIN I    Imaging Review Dg Chest 2 View  04/03/2014   CLINICAL DATA:  Chest pain lasting 15 min with shortness of breath and dizziness.  EXAM: CHEST  2 VIEW  COMPARISON:  09/30/2011.  FINDINGS: Trachea is midline. Heart size normal. Lungs are hyperinflated but clear. No pleural fluid. Degenerative changes are seen in the spine.  IMPRESSION: Hyperinflation without acute finding.   Electronically Signed   By: Lorin Picket M.D.   On: 04/03/2014 17:40     EKG Interpretation   Date/Time:  Thursday April 03 2014 16:53:28 EST Ventricular Rate:  74 PR Interval:  200 QRS Duration: 102 QT Interval:  396 QTC Calculation: 439 R Axis:   -16 Text Interpretation:  Sinus rhythm with occasional Premature ventricular  complexes and Premature atrial complexes Incomplete right bundle branch  block Moderate voltage criteria for LVH, may be normal variant Nonspecific  T  wave abnormality , atnerior changes compared to previous tracing  Abnormal ECG Confirmed by KNAPP  MD-J, JON [id number]) on 04/03/2014 7:15:38  PM      MDM   Final diagnoses:  Chest pain, unspecified chest pain type    The patient presented to the emergency room with complaints of substernal chest pain after exerting himself while raking leaves today. Concerning for cardiac chest pain.   Initial enzymes are negative. There are some nonspecific EKG changes.  He is symptom-free in the emergency department. I recommend admission to the hospital for further treatment and evaluation.  I personally performed the services described in this documentation, which was scribed in my presence. The recorded information has been reviewed and is accurate.    Dorie Rank, MD 04/03/14 [phone]

## 2014-04-04 ENCOUNTER — Observation Stay (HOSPITAL_COMMUNITY): Payer: Medicare Other

## 2014-04-04 ENCOUNTER — Other Ambulatory Visit: Payer: Self-pay

## 2014-04-04 DIAGNOSIS — R079 Chest pain, unspecified: Secondary | ICD-10-CM

## 2014-04-04 DIAGNOSIS — I10 Essential (primary) hypertension: Secondary | ICD-10-CM | POA: Diagnosis not present

## 2014-04-04 DIAGNOSIS — N179 Acute kidney failure, unspecified: Secondary | ICD-10-CM | POA: Diagnosis not present

## 2014-04-04 DIAGNOSIS — R0602 Shortness of breath: Secondary | ICD-10-CM | POA: Diagnosis not present

## 2014-04-04 DIAGNOSIS — I359 Nonrheumatic aortic valve disorder, unspecified: Secondary | ICD-10-CM

## 2014-04-04 LAB — TROPONIN I
Troponin I: <0.3 ng/mL (ref <0.30)
Troponin I: <0.3 ng/mL (ref <0.30)
Troponin I: <0.3 ng/mL (ref <0.30)

## 2014-04-04 LAB — CBC
HCT: 34.1 % — ABNORMAL LOW (ref 39.0–52.0)
HCT: 33.7 % — ABNORMAL LOW (ref 39.0–52.0)
Hemoglobin: 11.6 g/dL — ABNORMAL LOW (ref 13.0–17.0)
Hemoglobin: 11.3 g/dL — ABNORMAL LOW (ref 13.0–17.0)
MCH: 31.1 pg (ref 26.0–34.0)
MCH: 30.8 pg (ref 26.0–34.0)
MCHC: 34 g/dL (ref 30.0–36.0)
MCHC: 33.5 g/dL (ref 30.0–36.0)
MCV: 91.4 fL (ref 78.0–100.0)
MCV: 91.8 fL (ref 78.0–100.0)
Platelets: 186 10*3/uL (ref 150–400)
Platelets: 192 10*3/uL (ref 150–400)
RBC: 3.73 MIL/uL — ABNORMAL LOW (ref 4.22–5.81)
RBC: 3.67 MIL/uL — ABNORMAL LOW (ref 4.22–5.81)
RDW: 13.4 % (ref 11.5–15.5)
RDW: 13.4 % (ref 11.5–15.5)
WBC: 3.9 10*3/uL — ABNORMAL LOW (ref 4.0–10.5)
WBC: 4.1 10*3/uL (ref 4.0–10.5)

## 2014-04-04 LAB — COMPREHENSIVE METABOLIC PANEL
ALT: 10 U/L (ref 0–53)
AST: 20 U/L (ref 0–37)
Albumin: 3.1 g/dL — ABNORMAL LOW (ref 3.5–5.2)
Alkaline Phosphatase: 77 U/L (ref 39–117)
Anion gap: 12 (ref 5–15)
BUN: 8 mg/dL (ref 6–23)
CO2: 26 mEq/L (ref 19–32)
Calcium: 8.5 mg/dL (ref 8.4–10.5)
Chloride: 107 mEq/L (ref 96–112)
Creatinine, Ser: 1.24 mg/dL (ref 0.50–1.35)
GFR calc Af Amer: 58 mL/min — ABNORMAL LOW (ref >90)
GFR calc non Af Amer: 50 mL/min — ABNORMAL LOW (ref >90)
Glucose, Bld: 114 mg/dL — ABNORMAL HIGH (ref 70–99)
Potassium: 3.4 mEq/L — ABNORMAL LOW (ref 3.7–5.3)
Sodium: 145 mEq/L (ref 137–147)
Total Bilirubin: 0.4 mg/dL (ref 0.3–1.2)
Total Protein: 6 g/dL (ref 6.0–8.3)

## 2014-04-04 LAB — CREATININE, SERUM
Creatinine, Ser: 1.22 mg/dL (ref 0.50–1.35)
GFR calc Af Amer: 59 mL/min — ABNORMAL LOW (ref >90)
GFR calc non Af Amer: 51 mL/min — ABNORMAL LOW (ref >90)

## 2014-04-04 LAB — D-DIMER, QUANTITATIVE: D-Dimer, Quant: 0.47 ug/mL-FEU (ref 0.00–0.48)

## 2014-04-04 MED ORDER — GUAIFENESIN ER 600 MG PO TB12
600.0000 mg | ORAL_TABLET | Freq: Two times a day (BID) | ORAL | Status: DC
  Administered 2014-04-04 – 2014-04-05 (×3): 600 mg via ORAL
  Filled 2014-04-04 (×4): qty 1

## 2014-04-04 MED ORDER — VITAMIN D3 25 MCG (1000 UNIT) PO TABS
1000.0000 [IU] | ORAL_TABLET | Freq: Every day | ORAL | Status: DC
  Administered 2014-04-04 – 2014-04-05 (×2): 1000 [IU] via ORAL
  Filled 2014-04-04 (×2): qty 1

## 2014-04-04 MED ORDER — PANTOPRAZOLE SODIUM 40 MG PO TBEC
40.0000 mg | DELAYED_RELEASE_TABLET | Freq: Every day | ORAL | Status: DC
  Administered 2014-04-05: 40 mg via ORAL
  Filled 2014-04-04: qty 1

## 2014-04-04 MED ORDER — MORPHINE SULFATE 2 MG/ML IJ SOLN: 2.0000 mg | INTRAMUSCULAR | Status: DC | PRN

## 2014-04-04 MED ORDER — ASPIRIN EC 325 MG PO TBEC
325.0000 mg | DELAYED_RELEASE_TABLET | Freq: Every day | ORAL | Status: DC
  Administered 2014-04-04 – 2014-04-05 (×2): 325 mg via ORAL
  Filled 2014-04-04 (×2): qty 1

## 2014-04-04 MED ORDER — INFLUENZA VAC SPLIT QUAD 0.5 ML IM SUSY
0.5000 mL | PREFILLED_SYRINGE | INTRAMUSCULAR | Status: DC
  Filled 2014-04-04: qty 0.5

## 2014-04-04 MED ORDER — HYDROCHLOROTHIAZIDE 25 MG PO TABS
25.0000 mg | ORAL_TABLET | Freq: Every day | ORAL | Status: DC
  Administered 2014-04-04 – 2014-04-05 (×2): 25 mg via ORAL
  Filled 2014-04-04 (×2): qty 1

## 2014-04-04 MED ORDER — TECHNETIUM TC 99M SESTAMIBI GENERIC - CARDIOLITE
10.0000 | Freq: Once | INTRAVENOUS | Status: AC | PRN
  Administered 2014-04-04: 10 via INTRAVENOUS

## 2014-04-04 MED ORDER — HEPARIN SODIUM (PORCINE) 5000 UNIT/ML IJ SOLN
5000.0000 [IU] | Freq: Three times a day (TID) | INTRAMUSCULAR | Status: DC
  Administered 2014-04-04 – 2014-04-05 (×4): 5000 [IU] via SUBCUTANEOUS
  Filled 2014-04-04 (×7): qty 1

## 2014-04-04 MED ORDER — TAMSULOSIN HCL 0.4 MG PO CAPS
0.4000 mg | ORAL_CAPSULE | Freq: Every day | ORAL | Status: DC
  Administered 2014-04-04: 0.4 mg via ORAL
  Filled 2014-04-04 (×2): qty 1

## 2014-04-04 MED ORDER — SODIUM CHLORIDE 0.9 % IV SOLN
INTRAVENOUS | Status: DC
  Administered 2014-04-04 (×2): via INTRAVENOUS

## 2014-04-04 MED ORDER — LEVOFLOXACIN IN D5W 500 MG/100ML IV SOLN
500.0000 mg | INTRAVENOUS | Status: DC
Start: 2014-04-04 — End: 2014-04-05
  Administered 2014-04-04 – 2014-04-05 (×2): 500 mg via INTRAVENOUS
  Filled 2014-04-04 (×2): qty 100

## 2014-04-04 MED ORDER — ACETAMINOPHEN 325 MG PO TABS
650.0000 mg | ORAL_TABLET | ORAL | Status: DC | PRN
  Administered 2014-04-04: 650 mg via ORAL
  Filled 2014-04-04: qty 2

## 2014-04-04 MED ORDER — REGADENOSON 0.4 MG/5ML IV SOLN
INTRAVENOUS | Status: AC
  Filled 2014-04-04: qty 5

## 2014-04-04 MED ORDER — ZOLPIDEM TARTRATE 5 MG PO TABS: 5.0000 mg | ORAL_TABLET | Freq: Every evening | ORAL | Status: DC | PRN

## 2014-04-04 MED ORDER — AMLODIPINE BESYLATE 10 MG PO TABS
10.0000 mg | ORAL_TABLET | Freq: Every day | ORAL | Status: DC
  Administered 2014-04-04 – 2014-04-05 (×2): 10 mg via ORAL
  Filled 2014-04-04 (×2): qty 1

## 2014-04-04 MED ORDER — HYDRALAZINE HCL 20 MG/ML IJ SOLN: 10.0000 mg | Freq: Four times a day (QID) | INTRAMUSCULAR | Status: DC | PRN

## 2014-04-04 MED ORDER — GI COCKTAIL ~~LOC~~
30.0000 mL | Freq: Four times a day (QID) | ORAL | Status: DC | PRN
  Filled 2014-04-04: qty 30

## 2014-04-04 MED ORDER — METOPROLOL TARTRATE 100 MG PO TABS
100.0000 mg | ORAL_TABLET | Freq: Two times a day (BID) | ORAL | Status: DC
  Administered 2014-04-04 – 2014-04-05 (×4): 100 mg via ORAL
  Filled 2014-04-04 (×6): qty 1

## 2014-04-04 MED ORDER — ONDANSETRON HCL 4 MG/2ML IJ SOLN: 4.0000 mg | Freq: Four times a day (QID) | INTRAMUSCULAR | Status: DC | PRN

## 2014-04-04 MED ORDER — TECHNETIUM TC 99M SESTAMIBI - CARDIOLITE
30.0000 | Freq: Once | INTRAVENOUS | Status: AC | PRN
  Administered 2014-04-04: 30 via INTRAVENOUS

## 2014-04-04 MED ORDER — REGADENOSON 0.4 MG/5ML IV SOLN
0.4000 mg | Freq: Once | INTRAVENOUS | Status: AC
  Administered 2014-04-04: 0.4 mg via INTRAVENOUS
  Filled 2014-04-04: qty 5

## 2014-04-04 NOTE — Progress Notes (Signed)
UR completed 

## 2014-04-04 NOTE — Progress Notes (Signed)
Patient Demographics  Wayne Fuller, is a 80 y.o. male, DOB - 1923/09/17, [id number]  Admit date - 04/03/2014   Admitting Physician Rise Patience, MD  Outpatient Primary MD for the patient is OSEI-BONSU,GEORGE, MD  LOS - 1   Chief Complaint  Patient presents with  . Chest Pain      Admission history of present illness/brief narrative: Wayne Fuller is a 80 y.o. male presents with chest pain. The patient states that he was out raking some leaves and using a leaf blower. He states that he started to experience chest pain of sudden onset. On questioning further he states that pain was associated with respiration but there was no radiation. Patient states that he had the pain for about 15 minutes. He states that it is now gone. Patient denies any prior history of cardiac disease but he does apparently have a history of hypertension. Also carries a diagnosis of COPD. Patient troponins were negative 3, denies any chest pain, reports his most likely related to taking deep breath and coughing, patient d-dimer is within normal limit, had T-wave inversion in lateral leads, discussed with cardiology, will get me review stress test today.    Subjective:   Wayne Fuller today has, No headache, No chest pain, No abdominal pain - No Nausea, No new weakness tingling or numbness, No Cough - SOB.  Assessment & Plan    Principal Problem:   Chest pain Active Problems:   Essential hypertension   TOBACCO ABUSE, HX OF  Chest pain -currently resolved, has negative cardiac enzymes, has abnormal EKG significant for T-wave inversion in anterolateral leads, as with cardiology, will schedule for Myoview stress test today, meanwhile continue with aspirin, sublingual nitroglycerin. -D-dimer is within normal limits. -2-D echo still pending  Hypertension -Uncontrolled, will  continue with hydrochlorothiazide and metoprolol,will add Norvasc.   Acute renal failure -started on IV fluids,improving.  Acute bronchitis -Start on levofloxacin, Mucinex.  Code Status: Full Family Communication: patient is alert and oriented, discussed with son.  Disposition Plan: home when stable   Procedures  None ( 2-D echo and stress test pending)   Consults   none   Medications  Scheduled Meds: . aspirin EC  325 mg Oral Daily  . cholecalciferol  1,000 Units Oral Daily  . guaiFENesin  600 mg Oral BID  . heparin  5,000 Units Subcutaneous 3 times per day  . hydrochlorothiazide  25 mg Oral Daily  . [START ON 04/05/2014] Influenza vac split quadrivalent PF  0.5 mL Intramuscular Tomorrow-1000  . levofloxacin (LEVAQUIN) IV  500 mg Intravenous Q24H  . metoprolol  100 mg Oral BID  . nitroGLYCERIN  1 inch Topical 4 times per day  . pantoprazole  40 mg Oral Daily  . regadenoson      . tamsulosin  0.4 mg Oral QPC supper   Continuous Infusions: . sodium chloride 50 mL/hr at 04/04/14 0126   PRN Meds:.acetaminophen, gi cocktail, morphine injection, ondansetron (ZOFRAN) IV, zolpidem  DVT Prophylaxis  Heparin -  Lab Results  Component Value Date   PLT 192 04/04/2014    Antibiotics    Anti-infectives    Start     Dose/Rate Route Frequency Ordered Stop   04/04/14 1200  levofloxacin (LEVAQUIN) IVPB 500  mg     500 mg100 mL/hr over 60 Minutes Intravenous Every 24 hours 04/04/14 1054            Objective:   Filed Vitals:   04/04/14 1307 04/04/14 1309 04/04/14 1311 04/04/14 1313  BP: 214/104 168/84 169/87 187/93  Pulse:      Temp:      TempSrc:      Resp:      Height:      Weight:      SpO2:        Wt Readings from Last 3 Encounters:  04/04/14 71.532 kg (157 lb 11.2 oz)  07/25/13 72.576 kg (160 lb)  12/11/12 77.111 kg (170 lb)     Intake/Output Summary (Last 24 hours) at 04/04/14 1330 Last data filed at 04/04/14 0900  Gross per 24 hour  Intake  688.33 ml  Output    625 ml  Net  63.33 ml     Physical Exam  Awake Alert, Oriented X 3, No new F.N deficits, Normal affect [place].AT,PERRAL Supple Neck,No JVD, No cervical lymphadenopathy appriciated.  Symmetrical Chest wall movement, Good air movement bilaterally, CTAB RRR,No Gallops,Rubs or new Murmurs, No Parasternal Heave +ve B.Sounds, Abd Soft, No tenderness, No organomegaly appriciated, No rebound - guarding or rigidity. No Cyanosis, Clubbing or edema, No new Rash or bruise     Data Review   Micro Results No results found for this or any previous visit (from the past 240 hour(s)).  Radiology Reports Dg Chest 2 View  04/03/2014   CLINICAL DATA:  Chest pain lasting 15 min with shortness of breath and dizziness.  EXAM: CHEST  2 VIEW  COMPARISON:  09/30/2011.  FINDINGS: Trachea is midline. Heart size normal. Lungs are hyperinflated but clear. No pleural fluid. Degenerative changes are seen in the spine.  IMPRESSION: Hyperinflation without acute finding.   Electronically Signed   By: Lorin Picket M.D.   On: 04/03/2014 17:40    CBC  Recent Labs Lab 04/03/14 1725 04/04/14 0057 04/04/14 0335  WBC 4.0 3.9* 4.1  HGB 12.1* 11.6* 11.3*  HCT 36.1* 34.1* 33.7*  PLT 196 186 192  MCV 94.3 91.4 91.8  MCH 31.6 31.1 30.8  MCHC 33.5 34.0 33.5  RDW 13.1 13.4 13.4    Chemistries   Recent Labs Lab 04/03/14 1725 04/04/14 0057 04/04/14 0335  NA 144  --  145  K 3.9  --  3.4*  CL 107  --  107  CO2 26  --  26  GLUCOSE 96  --  114*  BUN 9  --  8  CREATININE 1.40* 1.22 1.24  CALCIUM 8.8  --  8.5  AST 23  --  20  ALT 12  --  10  ALKPHOS 84  --  77  BILITOT 0.4  --  0.4   ------------------------------------------------------------------------------------------------------------------ estimated creatinine clearance is 41.6 mL/min (by C-G formula based on Cr of  1.24). ------------------------------------------------------------------------------------------------------------------ No results for input(s): HGBA1C in the last 72 hours. ------------------------------------------------------------------------------------------------------------------ No results for input(s): CHOL, HDL, LDLCALC, TRIG, CHOLHDL, LDLDIRECT in the last 72 hours. ------------------------------------------------------------------------------------------------------------------ No results for input(s): TSH, T4TOTAL, T3FREE, THYROIDAB in the last 72 hours.  Invalid input(s): FREET3 ------------------------------------------------------------------------------------------------------------------ No results for input(s): VITAMINB12, FOLATE, FERRITIN, TIBC, IRON, RETICCTPCT in the last 72 hours.  Coagulation profile No results for input(s): INR, PROTIME in the last 168 hours.   Recent Labs  04/04/14 0914  DDIMER 0.47    Cardiac Enzymes  Recent Labs Lab 04/04/14 0057 04/04/14 0335  04/04/14 0650  TROPONINI <0.30 <0.30 <0.30   ------------------------------------------------------------------------------------------------------------------ Invalid input(s): POCBNP     Time Spent in minutes   30 minutes   ELGERGAWY, DAWOOD M.D on 04/04/2014 at 1:30 PM  Between 7am to 7pm - Pager - [phone]  After 7pm go to [email or web address] - password [email or web address]  And look for the night coverage person covering for me after hours  [facility] Office  [phone]   **Disclaimer: This note may have been dictated with voice recognition software. Similar sounding words can inadvertently be transcribed and this note may contain transcription errors which may not have been corrected upon publication of note.**

## 2014-04-04 NOTE — Progress Notes (Signed)
Echocardiogram 2D Echocardiogram has been performed.  Joelene Millin 04/04/2014, 9:50 AM

## 2014-04-04 NOTE — H&P (Signed)
[facility] History and Physical  Wayne Fuller [id number] DOB: [birth year] DOA: 04/03/2014  Referring physician: Dorie Rank MD PCP: Benito Mccreedy, MD   Chief Complaint: Chest pain  HPI: Wayne Fuller is a 80 y.o. male presents with chest pain. The patient states that he was out raking some leaves and using a leaf blower. He states that he started to experience chest pain of sudden onset. On questioning further he states that pain was associated with respiration but there was no radiation. Patient states that he had the pain for about 15 minutes. He states that it is now gone. Patient denies any prior history of cardiac disease but he does apparently have a history of hypertension. Also carries a diagnosis of COPD.   Review of Systems:  Constitutional:  No weight loss, night sweats, Fevers, chills, fatigue.  HEENT:  No headaches, Difficulty swallowing,Tooth/dental problems,Sore throat,  No sneezing Cardio-vascular:  ++chest pain, no Orthopnea, PND, swelling in lower extremities, anasarca, dizziness GI:  No heartburn, indigestion, abdominal pain, nausea, vomiting, diarrhea  Resp:  No shortness of breath with exertion or at rest. No excess mucus, no productive cough, No coughing up of blood.  Skin:  no rash or lesions GU:  no dysuria, change in color of urine.  Musculoskeletal:  No joint pain or swelling. No decreased range of motion.  Psych:  No change in mood or affect. No depression or anxiety.   Past Medical History  Diagnosis Date  . Hypertension    Past Surgical History  Procedure Laterality Date  . Back surgery    . Cervical spine surgery     Social History:  reports that he has never smoked. He does not have any smokeless tobacco history on file. He reports that he does not drink alcohol or use illicit drugs.  No Known Allergies  History reviewed. No pertinent family history.   Prior to Admission medications   Medication Sig Start Date End Date  Taking? Authorizing Provider  aspirin 81 MG tablet Take 81 mg by mouth daily.    Historical Provider, MD  cholecalciferol (VITAMIN D) 1000 UNITS tablet Take 1,000 Units by mouth daily.    Historical Provider, MD  hydrochlorothiazide (HYDRODIURIL) 25 MG tablet Take 25 mg by mouth daily.    Historical Provider, MD  lansoprazole (PREVACID) 30 MG capsule Take 30 mg by mouth daily.    Historical Provider, MD  metoprolol (LOPRESSOR) 100 MG tablet Take 100 mg by mouth 2 (two) times daily.    Historical Provider, MD  Tamsulosin HCl (FLOMAX) 0.4 MG CAPS Take 0.4 mg by mouth daily after supper.    Historical Provider, MD   Physical Exam: Filed Vitals:   04/03/14 2005 04/03/14 2203 04/03/14 2324 04/03/14 2344  BP: 186/65 188/71 161/66   Pulse: 66 64 71   Temp:  97.9 F (36.6 C) 98.2 F (36.8 C)   TempSrc:   Oral   Resp: 16 14 16    Height:    5\' 11"  (1.803 m)  Weight:   71.895 kg (158 lb 8 oz)   SpO2: 100% 98% 100%     Wt Readings from Last 3 Encounters:  04/03/14 71.895 kg (158 lb 8 oz)  07/25/13 72.576 kg (160 lb)  12/11/12 77.111 kg (170 lb)    General:  Appears calm and comfortable Eyes: PERRL, normal lids, irises & conjunctiva ENT: grossly normal hearing, lips & tongue Neck: no LAD, masses or thyromegaly Cardiovascular: RRR, no m/r/g. No LE edema. Respiratory: CTA bilaterally, no w/r/r. Normal  respiratory effort. Abdomen: soft, ntnd Skin: no rash or induration seen on limited exam Musculoskeletal: grossly normal tone BUE/BLE Psychiatric: grossly normal mood and affect, speech fluent and appropriate Neurologic: grossly non-focal.          Labs on Admission:  Basic Metabolic Panel:  Recent Labs Lab 04/03/14 1725  NA 144  K 3.9  CL 107  CO2 26  GLUCOSE 96  BUN 9  CREATININE 1.40*  CALCIUM 8.8   Liver Function Tests:  Recent Labs Lab 04/03/14 1725  AST 23  ALT 12  ALKPHOS 84  BILITOT 0.4  PROT 6.6  ALBUMIN 3.5   No results for input(s): LIPASE, AMYLASE in  the last 168 hours. No results for input(s): AMMONIA in the last 168 hours. CBC:  Recent Labs Lab 04/03/14 1725  WBC 4.0  HGB 12.1*  HCT 36.1*  MCV 94.3  PLT 196   Cardiac Enzymes:  Recent Labs Lab 04/03/14 1725 04/03/14 1930  TROPONINI <0.30 <0.30    BNP (last 3 results) No results for input(s): PROBNP in the last 8760 hours. CBG: No results for input(s): GLUCAP in the last 168 hours.  Radiological Exams on Admission: Dg Chest 2 View  04/03/2014   CLINICAL DATA:  Chest pain lasting 15 min with shortness of breath and dizziness.  EXAM: CHEST  2 VIEW  COMPARISON:  09/30/2011.  FINDINGS: Trachea is midline. Heart size normal. Lungs are hyperinflated but clear. No pleural fluid. Degenerative changes are seen in the spine.  IMPRESSION: Hyperinflation without acute finding.   Electronically Signed   By: Lorin Picket M.D.   On: 04/03/2014 17:40    EKG: Bigeminy  Assessment/Plan Principal Problem:   Chest pain Active Problems:   Essential hypertension   TOBACCO ABUSE, HX OF   1. Chest pain -will admit for observation -NTG prn for pain -check serial enzymes -echo in am -may need stress test evaluation  2. Hypertension -will continue with home medications -monitor pressures -will get an echo in am  3. Elevated Creatinine -started on IV fluids -repeat labs -baseline creatinine is around 1.1 now is 1.4  4. Abnormal ECG -?related to ischemia -will follow serial ECG     Code Status: FUll Code (must indicate code status--if unknown or must be presumed, indicate so) DVT Prophylaxis:heparin Family Communication: Son (indicate person spoken with, if applicable, with phone number if by telephone) Disposition Plan: Home (indicate anticipated LOS)  Time spent: 35min  KHAN,SAADAT A [facility] Pager [phone]

## 2014-04-04 NOTE — Progress Notes (Signed)
Pt.A/Ox3 and is ambulatory with 1 person assist. Pt.has stress test done today, am medications were on hold until stress test was completed and then were given. Pt.had no c/o pain during the shift.

## 2014-04-04 NOTE — Plan of Care (Signed)
Problem: Phase I Progression Outcomes Goal: Aspirin unless contraindicated Outcome: Completed/Met Date Met:  04/04/14 Goal: MD aware of Cardiac Marker results Outcome: Completed/Met Date Met:  04/04/14 Goal: Voiding-avoid urinary catheter unless indicated Outcome: Completed/Met Date Met:  04/04/14

## 2014-04-05 LAB — BASIC METABOLIC PANEL
Anion gap: 16 — ABNORMAL HIGH (ref 5–15)
BUN: 9 mg/dL (ref 6–23)
CO2: 21 mEq/L (ref 19–32)
Calcium: 8.9 mg/dL (ref 8.4–10.5)
Chloride: 106 mEq/L (ref 96–112)
Creatinine, Ser: 1.17 mg/dL (ref 0.50–1.35)
GFR calc Af Amer: 62 mL/min — ABNORMAL LOW (ref >90)
GFR calc non Af Amer: 54 mL/min — ABNORMAL LOW (ref >90)
Glucose, Bld: 76 mg/dL (ref 70–99)
Potassium: 3.6 mEq/L — ABNORMAL LOW (ref 3.7–5.3)
Sodium: 143 mEq/L (ref 137–147)

## 2014-04-05 LAB — CBC
HCT: 36.8 % — ABNORMAL LOW (ref 39.0–52.0)
Hemoglobin: 12.4 g/dL — ABNORMAL LOW (ref 13.0–17.0)
MCH: 31.8 pg (ref 26.0–34.0)
MCHC: 33.7 g/dL (ref 30.0–36.0)
MCV: 94.4 fL (ref 78.0–100.0)
Platelets: 181 10*3/uL (ref 150–400)
RBC: 3.9 MIL/uL — ABNORMAL LOW (ref 4.22–5.81)
RDW: 13.5 % (ref 11.5–15.5)
WBC: 4 10*3/uL (ref 4.0–10.5)

## 2014-04-05 MED ORDER — HYDROCHLOROTHIAZIDE 25 MG PO TABS: 25.0000 mg | ORAL_TABLET | Freq: Every day | ORAL | Status: DC

## 2014-04-05 MED ORDER — LEVOFLOXACIN 500 MG PO TABS: 500.0000 mg | ORAL_TABLET | Freq: Every day | ORAL | Status: AC

## 2014-04-05 MED ORDER — METOPROLOL TARTRATE 100 MG PO TABS
100.0000 mg | ORAL_TABLET | Freq: Two times a day (BID) | ORAL | Status: DC
Start: 2014-04-05 — End: 2014-08-11

## 2014-04-05 MED ORDER — POTASSIUM CHLORIDE CRYS ER 20 MEQ PO TBCR: 40.0000 meq | EXTENDED_RELEASE_TABLET | Freq: Two times a day (BID) | ORAL | Status: DC

## 2014-04-05 MED ORDER — VITAMIN D 1000 UNITS PO TABS: 1000.0000 [IU] | ORAL_TABLET | Freq: Every day | ORAL | Status: DC

## 2014-04-05 NOTE — Discharge Instructions (Signed)
Follow with Primary Wayne Fuller OSEI-BONSU,GEORGE, Wayne Fuller in 7 days   Get CBC, CMP, 2 view Chest X ray checked  by Primary Wayne Fuller next visit.    Activity: As tolerated with Full fall precautions use walker/cane & assistance as needed   Disposition Home    Diet: Heart Healthy  , with feeding assistance and aspiration precautions as needed.  For Heart failure patients - Check your Weight same time everyday, if you gain over 2 pounds, or you develop in leg swelling, experience more shortness of breath or chest pain, call your Primary Wayne Fuller immediately. Follow Cardiac Low Salt Diet and 1.8 lit/day fluid restriction.   On your next visit with your primary care physician please Get Medicines reviewed and adjusted.   Please request your Prim.Wayne Fuller to go over all Hospital Tests and Procedure/Radiological results at the follow up, please get all Hospital records sent to your Prim Wayne Fuller by signing hospital release before you go home.   If you experience worsening of your admission symptoms, develop shortness of breath, life threatening emergency, suicidal or homicidal thoughts you must seek medical attention immediately by calling 911 or calling your Wayne Fuller immediately  if symptoms less severe.  You Must read complete instructions/literature along with all the possible adverse reactions/side effects for all the Medicines you take and that have been prescribed to you. Take any new Medicines after you have completely understood and accpet all the possible adverse reactions/side effects.   Do not drive, operating heavy machinery, perform activities at heights, swimming or participation in water activities or provide baby sitting services if your were admitted for syncope or siezures until you have seen by Primary Wayne Fuller or a Neurologist and advised to do so again.  Do not drive when taking Pain medications.    Do not take more than prescribed Pain, Sleep and Anxiety Medications  Special Instructions: If you have smoked or chewed  Tobacco  in the last 2 yrs please stop smoking, stop any regular Alcohol  and or any Recreational drug use.  Wear Seat belts while driving.   Please note  You were cared for by a hospitalist during your hospital stay. If you have any questions about your discharge medications or the care you received while you were in the hospital after you are discharged, you can call the unit and asked to speak with the hospitalist on call if the hospitalist that took care of you is not available. Once you are discharged, your primary care physician will handle any further medical issues. Please note that NO REFILLS for any discharge medications will be authorized once you are discharged, as it is imperative that you return to your primary care physician (or establish a relationship with a primary care physician if you do not have one) for your aftercare needs so that they can reassess your need for medications and monitor your lab values.

## 2014-04-05 NOTE — Progress Notes (Signed)
Flu vaccine given. Syringe disposed prior to retrieving Lot information. Pt counseled and advised on side effects and reactions.

## 2014-04-05 NOTE — Progress Notes (Signed)
Went to patient room to d/c iv, patient stated he had nose bleed, bleed has stop before get to the room. Denies pain or dizziness, assist patient dressed to be d/c, instruction given to family, family verbalized understanding. Pt. D/c per order. MD notified of nose bleed.

## 2014-04-05 NOTE — Discharge Summary (Signed)
Wayne Fuller, 80 y.o., DOB 21-Aug-1923, MRN [id number]. Admission date: 04/03/2014 Discharge Date 04/05/2014 Primary MD Benito Mccreedy, MD Admitting Physician Rise Patience, MD  Admission Diagnosis  Chest pain [R07.9] Chest pain, unspecified chest pain type [R07.9]  Discharge Diagnosis   Principal Problem:   Chest pain Active Problems:   Essential hypertension   TOBACCO ABUSE, HX OF      Past Medical History  Diagnosis Date  . Hypertension     Past Surgical History  Procedure Laterality Date  . Back surgery    . Cervical spine surgery     Admission history of present illness/brief narrative: Wayne Fuller is a 80 y.o. male presents with chest pain. The patient states that he was out raking some leaves and using a leaf blower. He states that he started to experience chest pain of sudden onset. On questioning further he states that pain was associated with respiration but there was no radiation. Patient states that he had the pain for about 15 minutes. He states that it is now gone. Patient denies any prior history of cardiac disease but he does apparently have a history of hypertension. Also carries a diagnosis of COPD Patient cardiac enzymes were negative, EKG did show T-wave inversion on the lateral leads, but unclear if this is all new as no EKG to compare, patient underwent Myoview stress test which was low risk stress test,no evidence of reversible ischemia on infarction, 2-D echo showing normal ejection fraction, and mild wall thickening. Patient was noticed to have cough, congestion, x-ray does not show any opacity or infiltrate, patient was started on levofloxacin for acute bronchitis.  Hospital Course See H&P, Labs, Consult and Test reports for all details in brief, patient was admitted for **  Principal Problem:   Chest pain Active Problems:   Essential hypertension   TOBACCO ABUSE, HX OF  Chest pain - resolved, has negative cardiac enzymes, no events on  telemetry. -D-dimer is within normal limits. -2-D echo EF 55-60%, mild LVH -nuclear stress test showing no reversible ischemia or infarction, normal ejection fraction,low risk stress test findings.  Hypertension -we'll discharge on his home medication includinghydrochlorothiazide and metoprolol,and Cardizem.   Acute renal failure -resolved, back to baseline, no further NSAID on discharge.  Acute bronchitis -to finish total of 5 days of levofloxacin.    Significant Tests:  See full reports for all details    Dg Chest 2 View  04/03/2014   CLINICAL DATA:  Chest pain lasting 15 min with shortness of breath and dizziness.  EXAM: CHEST  2 VIEW  COMPARISON:  09/30/2011.  FINDINGS: Trachea is midline. Heart size normal. Lungs are hyperinflated but clear. No pleural fluid. Degenerative changes are seen in the spine.  IMPRESSION: Hyperinflation without acute finding.   Electronically Signed   By: Lorin Picket M.D.   On: 04/03/2014 17:40   Nm Myocar Multi W/spect W/wall Motion / Ef  04/04/2014   CLINICAL DATA:  80 year old man with chest pain during light exertion and abnormal ECG  EXAM: MYOCARDIAL IMAGING WITH SPECT (REST AND PHARMACOLOGIC-STRESS)  GATED LEFT VENTRICULAR WALL MOTION STUDY  LEFT VENTRICULAR EJECTION FRACTION  TECHNIQUE: Standard myocardial SPECT imaging was performed after resting intravenous injection of 10 mCi Tc-23m sestamibi. Subsequently, intravenous infusion of Lexiscan was performed under the supervision of the Cardiology staff. At peak effect of the drug, 30 mCi Tc-40m sestamibi was injected intravenously and standard myocardial SPECT imaging was performed. Quantitative gated imaging was also performed to evaluate left ventricular wall  motion, and estimate left ventricular ejection fraction.  COMPARISON:  None.  FINDINGS: Perfusion: There is mildly reduced tracer uptake in the basal and mid inferior wall on both stress and rest images, without reversibility. Wall motion is  normal in this territory. This is probably due to diaphragmatic attenuation artifact. Other walls appear to have normal perfusion.  Wall Motion: Normal left ventricular wall motion. No left ventricular dilation.  Left Ventricular Ejection Fraction: 58 %  End diastolic volume 0000000 ml  End systolic volume 53 ml  IMPRESSION: 1. No reversible ischemia or infarction. Probable inferior wall diaphragmatic attenuation artifact.  2. Normal left ventricular wall motion.  3. Left ventricular ejection fraction 58%  4. Low-risk stress test findings*.  *2012 Appropriate Use Criteria for Coronary Revascularization Focused Update: J Am Coll Cardiol. [id number]. http://content.airportbarriers.com.aspx?articleid=1201161   Electronically Signed   By: Sanda Klein   On: 04/04/2014 15:49     Today   Subjective:   Wayne Fuller today has no headache,no chest or  abdominal pain,no new weakness tingling or numbness, feels much better wants to go home today.   Objective:   Blood pressure 174/79, pulse 69, temperature 97.9 F (36.6 C), temperature source Oral, resp. rate 20, height 5\' 11"  (1.803 m), weight 69.7 kg (153 lb 10.6 oz), SpO2 95 %.  Intake/Output Summary (Last 24 hours) at 04/05/14 1128 Last data filed at 04/05/14 0948  Gross per 24 hour  Intake   1130 ml  Output   1926 ml  Net   -796 ml    Exam Awake Alert, Oriented *3, No new F.N deficits, Normal affect [place].AT,PERRAL Supple Neck,No JVD, No cervical lymphadenopathy appriciated.  Symmetrical Chest wall movement, Good air movement bilaterally, CTAB RRR,No Gallops,Rubs or new Murmurs, No Parasternal Heave +ve B.Sounds, Abd Soft, Non tender, No organomegaly appriciated, No rebound -guarding or rigidity. No Cyanosis, Clubbing or edema, No new Rash or bruise  Data Review    CBC w Diff:  Lab Results  Component Value Date   WBC 4.0 04/05/2014   HGB 12.4* 04/05/2014   HCT 36.8* 04/05/2014   PLT 181 04/05/2014   LYMPHOPCT 43 09/30/2011    MONOPCT 10 09/30/2011   EOSPCT 1 09/30/2011   BASOPCT 1 09/30/2011   CMP:  Lab Results  Component Value Date   NA 143 04/05/2014   K 3.6* 04/05/2014   CL 106 04/05/2014   CO2 21 04/05/2014   BUN 9 04/05/2014   CREATININE 1.17 04/05/2014   PROT 6.0 04/04/2014   ALBUMIN 3.1* 04/04/2014   BILITOT 0.4 04/04/2014   ALKPHOS 77 04/04/2014   AST 20 04/04/2014   ALT 10 04/04/2014  .  Micro Results No results found for this or any previous visit (from the past 240 hour(s)).   Discharge Instructions          Follow-up Information    Follow up with OSEI-BONSU,GEORGE, MD. Schedule an appointment as soon as possible for a visit in 1 week.   Specialty:  Internal Medicine   Contact information:   [id number] [place] [address] [phone]       Discharge Medications     Medication List    STOP taking these medications        DILT-XR 240 MG 24 hr capsule  Generic drug:  diltiazem     etodolac 400 MG 24 hr tablet  Commonly known as:  LODINE XL      TAKE these medications  aspirin EC 81 MG tablet  Take 81 mg by mouth daily.     cholecalciferol 1000 UNITS tablet  Commonly known as:  VITAMIN D  Take 1 tablet (1,000 Units total) by mouth daily.     diltiazem 240 MG 24 hr capsule  Commonly known as:  CARDIZEM CD  Take 240 mg by mouth daily.     doxazosin 2 MG tablet  Commonly known as:  CARDURA  Take 2 mg by mouth daily.     folic acid 1 MG tablet  Commonly known as:  FOLVITE  Take 1 mg by mouth daily.     hydrochlorothiazide 25 MG tablet  Commonly known as:  HYDRODIURIL  Take 1 tablet (25 mg total) by mouth daily.     lansoprazole 30 MG capsule  Commonly known as:  PREVACID  Take 30 mg by mouth daily.     levofloxacin 500 MG tablet  Commonly known as:  LEVAQUIN  Take 1 tablet (500 mg total) by mouth daily.  Start taking on:  04/06/2014     metoprolol 100 MG tablet  Commonly known as:  LOPRESSOR  Take 1 tablet (100 mg  total) by mouth 2 (two) times daily.     multivitamin with minerals Tabs tablet  Take 1 tablet by mouth daily.     tamsulosin 0.4 MG Caps capsule  Commonly known as:  FLOMAX  Take 0.4 mg by mouth daily.         Total Time in preparing paper work, data evaluation and todays exam - 35 minutes  ELGERGAWY, DAWOOD M.D on 04/05/2014 at 11:28 AM  [facility] Office  [phone]

## 2014-06-06 DIAGNOSIS — K219 Gastro-esophageal reflux disease without esophagitis: Secondary | ICD-10-CM | POA: Diagnosis not present

## 2014-06-06 DIAGNOSIS — I119 Hypertensive heart disease without heart failure: Secondary | ICD-10-CM | POA: Diagnosis not present

## 2014-06-06 DIAGNOSIS — I2510 Atherosclerotic heart disease of native coronary artery without angina pectoris: Secondary | ICD-10-CM | POA: Diagnosis not present

## 2014-06-06 DIAGNOSIS — N400 Enlarged prostate without lower urinary tract symptoms: Secondary | ICD-10-CM | POA: Diagnosis not present

## 2014-06-06 DIAGNOSIS — I10 Essential (primary) hypertension: Secondary | ICD-10-CM | POA: Diagnosis not present

## 2014-06-26 DIAGNOSIS — Z1389 Encounter for screening for other disorder: Secondary | ICD-10-CM | POA: Diagnosis not present

## 2014-06-26 DIAGNOSIS — I119 Hypertensive heart disease without heart failure: Secondary | ICD-10-CM | POA: Diagnosis not present

## 2014-06-26 DIAGNOSIS — Z136 Encounter for screening for cardiovascular disorders: Secondary | ICD-10-CM | POA: Diagnosis not present

## 2014-06-26 DIAGNOSIS — I10 Essential (primary) hypertension: Secondary | ICD-10-CM | POA: Diagnosis not present

## 2014-06-26 DIAGNOSIS — N400 Enlarged prostate without lower urinary tract symptoms: Secondary | ICD-10-CM | POA: Diagnosis not present

## 2014-06-26 DIAGNOSIS — I739 Peripheral vascular disease, unspecified: Secondary | ICD-10-CM | POA: Diagnosis not present

## 2014-06-26 DIAGNOSIS — Z0000 Encounter for general adult medical examination without abnormal findings: Secondary | ICD-10-CM | POA: Diagnosis not present

## 2014-06-26 DIAGNOSIS — Z01118 Encounter for examination of ears and hearing with other abnormal findings: Secondary | ICD-10-CM | POA: Diagnosis not present

## 2014-06-27 LAB — LIPID PANEL
Cholesterol: 199 mg/dL (ref 0–200)
HDL: 98 mg/dL — AB (ref 35–70)
LDL Cholesterol: 79 mg/dL
Triglycerides: 111 mg/dL (ref 40–160)

## 2014-07-08 DIAGNOSIS — I119 Hypertensive heart disease without heart failure: Secondary | ICD-10-CM | POA: Diagnosis not present

## 2014-07-08 DIAGNOSIS — R42 Dizziness and giddiness: Secondary | ICD-10-CM | POA: Diagnosis not present

## 2014-07-08 DIAGNOSIS — N400 Enlarged prostate without lower urinary tract symptoms: Secondary | ICD-10-CM | POA: Diagnosis not present

## 2014-07-08 DIAGNOSIS — I739 Peripheral vascular disease, unspecified: Secondary | ICD-10-CM | POA: Diagnosis not present

## 2014-07-08 DIAGNOSIS — I10 Essential (primary) hypertension: Secondary | ICD-10-CM | POA: Diagnosis not present

## 2014-07-15 DIAGNOSIS — R300 Dysuria: Secondary | ICD-10-CM | POA: Diagnosis not present

## 2014-07-15 DIAGNOSIS — N401 Enlarged prostate with lower urinary tract symptoms: Secondary | ICD-10-CM | POA: Diagnosis not present

## 2014-07-15 DIAGNOSIS — R3912 Poor urinary stream: Secondary | ICD-10-CM | POA: Diagnosis not present

## 2014-07-15 DIAGNOSIS — R351 Nocturia: Secondary | ICD-10-CM | POA: Diagnosis not present

## 2014-08-11 ENCOUNTER — Emergency Department (HOSPITAL_COMMUNITY): Payer: Medicare Other

## 2014-08-11 ENCOUNTER — Encounter (HOSPITAL_COMMUNITY): Payer: Self-pay | Admitting: Emergency Medicine

## 2014-08-11 ENCOUNTER — Observation Stay (HOSPITAL_COMMUNITY)
Admission: EM | Admit: 2014-08-11 | Discharge: 2014-08-12 | Disposition: A | Discharge status: Home / Self Care | Payer: Medicare Other | Attending: Internal Medicine | Admitting: Internal Medicine

## 2014-08-11 DIAGNOSIS — R079 Chest pain, unspecified: Principal | ICD-10-CM | POA: Insufficient documentation

## 2014-08-11 DIAGNOSIS — N183 Chronic kidney disease, stage 3 (moderate): Secondary | ICD-10-CM | POA: Insufficient documentation

## 2014-08-11 DIAGNOSIS — I25119 Atherosclerotic heart disease of native coronary artery with unspecified angina pectoris: Secondary | ICD-10-CM

## 2014-08-11 DIAGNOSIS — R0789 Other chest pain: Secondary | ICD-10-CM

## 2014-08-11 DIAGNOSIS — I2510 Atherosclerotic heart disease of native coronary artery without angina pectoris: Secondary | ICD-10-CM | POA: Diagnosis not present

## 2014-08-11 DIAGNOSIS — I119 Hypertensive heart disease without heart failure: Secondary | ICD-10-CM | POA: Diagnosis not present

## 2014-08-11 DIAGNOSIS — I129 Hypertensive chronic kidney disease with stage 1 through stage 4 chronic kidney disease, or unspecified chronic kidney disease: Secondary | ICD-10-CM | POA: Insufficient documentation

## 2014-08-11 DIAGNOSIS — K219 Gastro-esophageal reflux disease without esophagitis: Secondary | ICD-10-CM | POA: Diagnosis not present

## 2014-08-11 DIAGNOSIS — Z7982 Long term (current) use of aspirin: Secondary | ICD-10-CM | POA: Insufficient documentation

## 2014-08-11 DIAGNOSIS — Z79899 Other long term (current) drug therapy: Secondary | ICD-10-CM | POA: Diagnosis not present

## 2014-08-11 DIAGNOSIS — I10 Essential (primary) hypertension: Secondary | ICD-10-CM | POA: Diagnosis not present

## 2014-08-11 DIAGNOSIS — N400 Enlarged prostate without lower urinary tract symptoms: Secondary | ICD-10-CM | POA: Insufficient documentation

## 2014-08-11 DIAGNOSIS — N184 Chronic kidney disease, stage 4 (severe): Secondary | ICD-10-CM | POA: Diagnosis present

## 2014-08-11 LAB — I-STAT TROPONIN, ED: Troponin i, poc: 0.02 ng/mL (ref 0.00–0.08)

## 2014-08-11 LAB — BRAIN NATRIURETIC PEPTIDE: B Natriuretic Peptide: 53.1 pg/mL (ref 0.0–100.0)

## 2014-08-11 LAB — BASIC METABOLIC PANEL
Anion gap: 7 (ref 5–15)
BUN: 16 mg/dL (ref 6–23)
CO2: 23 mmol/L (ref 19–32)
Calcium: 8.3 mg/dL — ABNORMAL LOW (ref 8.4–10.5)
Chloride: 107 mmol/L (ref 96–112)
Creatinine, Ser: 1.55 mg/dL — ABNORMAL HIGH (ref 0.50–1.35)
GFR calc Af Amer: 44 mL/min — ABNORMAL LOW (ref >90)
GFR calc non Af Amer: 38 mL/min — ABNORMAL LOW (ref >90)
Glucose, Bld: 144 mg/dL — ABNORMAL HIGH (ref 70–99)
Potassium: 3.5 mmol/L (ref 3.5–5.1)
Sodium: 137 mmol/L (ref 135–145)

## 2014-08-11 LAB — CBC
HCT: 38.1 % — ABNORMAL LOW (ref 39.0–52.0)
Hemoglobin: 12.5 g/dL — ABNORMAL LOW (ref 13.0–17.0)
MCH: 31.3 pg (ref 26.0–34.0)
MCHC: 32.8 g/dL (ref 30.0–36.0)
MCV: 95.3 fL (ref 78.0–100.0)
Platelets: 191 10*3/uL (ref 150–400)
RBC: 4 MIL/uL — ABNORMAL LOW (ref 4.22–5.81)
RDW: 13.1 % (ref 11.5–15.5)
WBC: 3.7 10*3/uL — ABNORMAL LOW (ref 4.0–10.5)

## 2014-08-11 MED ORDER — NITROGLYCERIN 2 % TD OINT
0.5000 [in_us] | TOPICAL_OINTMENT | Freq: Four times a day (QID) | TRANSDERMAL | Status: DC
  Administered 2014-08-11 – 2014-08-12 (×2): 0.5 [in_us] via TOPICAL
  Filled 2014-08-11: qty 30

## 2014-08-11 MED ORDER — ASPIRIN EC 81 MG PO TBEC
81.0000 mg | DELAYED_RELEASE_TABLET | Freq: Every day | ORAL | Status: DC
  Administered 2014-08-11 – 2014-08-12 (×2): 81 mg via ORAL
  Filled 2014-08-11 (×2): qty 1

## 2014-08-11 MED ORDER — HYDRALAZINE HCL 20 MG/ML IJ SOLN
10.0000 mg | INTRAMUSCULAR | Status: DC | PRN
  Administered 2014-08-11: 10 mg via INTRAVENOUS
  Filled 2014-08-11: qty 1

## 2014-08-11 MED ORDER — PANTOPRAZOLE SODIUM 40 MG PO TBEC
40.0000 mg | DELAYED_RELEASE_TABLET | Freq: Every day | ORAL | Status: DC
  Administered 2014-08-11 – 2014-08-12 (×2): 40 mg via ORAL
  Filled 2014-08-11 (×2): qty 1

## 2014-08-11 MED ORDER — ACETAMINOPHEN 325 MG PO TABS
650.0000 mg | ORAL_TABLET | ORAL | Status: DC | PRN
  Filled 2014-08-11: qty 2

## 2014-08-11 MED ORDER — DILTIAZEM HCL ER COATED BEADS 240 MG PO CP24
240.0000 mg | ORAL_CAPSULE | Freq: Every day | ORAL | Status: DC
  Administered 2014-08-12: 240 mg via ORAL
  Filled 2014-08-11: qty 1

## 2014-08-11 MED ORDER — ONDANSETRON HCL 4 MG/2ML IJ SOLN: 4.0000 mg | Freq: Four times a day (QID) | INTRAMUSCULAR | Status: DC | PRN

## 2014-08-11 MED ORDER — TAMSULOSIN HCL 0.4 MG PO CAPS
0.4000 mg | ORAL_CAPSULE | Freq: Every day | ORAL | Status: DC
  Administered 2014-08-11 – 2014-08-12 (×2): 0.4 mg via ORAL
  Filled 2014-08-11 (×2): qty 1

## 2014-08-11 MED ORDER — MORPHINE SULFATE 2 MG/ML IJ SOLN: 2.0000 mg | INTRAMUSCULAR | Status: DC | PRN

## 2014-08-11 MED ORDER — ENSURE ENLIVE PO LIQD
237.0000 mL | Freq: Two times a day (BID) | ORAL | Status: DC
  Filled 2014-08-11 (×2): qty 237

## 2014-08-11 MED ORDER — HEPARIN SODIUM (PORCINE) 5000 UNIT/ML IJ SOLN
5000.0000 [IU] | Freq: Three times a day (TID) | INTRAMUSCULAR | Status: DC
  Administered 2014-08-11 – 2014-08-12 (×2): 5000 [IU] via SUBCUTANEOUS
  Filled 2014-08-11 (×4): qty 1

## 2014-08-11 MED ORDER — DOXAZOSIN MESYLATE 2 MG PO TABS: 2.0000 mg | ORAL_TABLET | Freq: Every day | ORAL | Status: DC

## 2014-08-11 MED ORDER — HYDROCHLOROTHIAZIDE 25 MG PO TABS
25.0000 mg | ORAL_TABLET | Freq: Every day | ORAL | Status: DC
Start: 2014-08-11 — End: 2014-08-11

## 2014-08-11 MED ORDER — METOPROLOL TARTRATE 50 MG PO TABS
100.0000 mg | ORAL_TABLET | Freq: Two times a day (BID) | ORAL | Status: DC
  Administered 2014-08-11 – 2014-08-12 (×2): 100 mg via ORAL
  Filled 2014-08-11: qty 4
  Filled 2014-08-11: qty 2

## 2014-08-11 NOTE — ED Provider Notes (Signed)
CSN: [id number]     Arrival date & time 08/11/14  1625 History   First MD Initiated Contact with Patient 08/11/14 1945     Chief Complaint  Patient presents with  . Chest Pain     (Consider location/radiation/quality/duration/timing/severity/associated sxs/prior Treatment) HPI Comments: Patient here complaining of intermittent chest pain times several days worse over the past week. Pain is localized to left side of his chest and associated palpitations and dyspnea. Pain last for minutes to hours. Sometimes exertional and sometimes not. No fever or chills. No cough congestion. No leg pain or swelling. Saw his doctor today and had EKG which showed ectopy and was sent here for further checkup. Denies any syncope or near-syncope. No recent changes to his medications. Nothing improves his symptoms  Patient is a 81 y.o. male presenting with chest pain. The history is provided by the patient.  Chest Pain   Past Medical History  Diagnosis Date  . Hypertension    Past Surgical History  Procedure Laterality Date  . Back surgery    . Cervical spine surgery     No family history on file. History  Substance Use Topics  . Smoking status: Never Smoker   . Smokeless tobacco: Not on file  . Alcohol Use: No    Review of Systems  Cardiovascular: Positive for chest pain.  All other systems reviewed and are negative.     Allergies  Review of patient's allergies indicates no known allergies.  Home Medications   Prior to Admission medications   Medication Sig Start Date End Date Taking? Authorizing Provider  aspirin EC 81 MG tablet Take 81 mg by mouth daily.    Historical Provider, MD  cholecalciferol (VITAMIN D) 1000 UNITS tablet Take 1 tablet (1,000 Units total) by mouth daily. 04/05/14   Silver Huguenin Elgergawy, MD  diltiazem (CARDIZEM CD) 240 MG 24 hr capsule Take 240 mg by mouth daily.    Historical Provider, MD  doxazosin (CARDURA) 2 MG tablet Take 2 mg by mouth daily.  03/12/14    Historical Provider, MD  folic acid (FOLVITE) 1 MG tablet Take 1 mg by mouth daily.  03/10/14   Historical Provider, MD  hydrochlorothiazide (HYDRODIURIL) 25 MG tablet Take 1 tablet (25 mg total) by mouth daily. 04/05/14   Silver Huguenin Elgergawy, MD  lansoprazole (PREVACID) 30 MG capsule Take 30 mg by mouth daily.    Historical Provider, MD  metoprolol (LOPRESSOR) 100 MG tablet Take 1 tablet (100 mg total) by mouth 2 (two) times daily. 04/05/14   Albertine Patricia, MD  Multiple Vitamin (MULTIVITAMIN WITH MINERALS) TABS tablet Take 1 tablet by mouth daily.    Historical Provider, MD  Tamsulosin HCl (FLOMAX) 0.4 MG CAPS Take 0.4 mg by mouth daily.     Historical Provider, MD   BP 171/67 mmHg  Pulse 97  Temp(Src) 98.2 F (36.8 C) (Oral)  Resp 23  SpO2 99% Physical Exam  Constitutional: He is oriented to person, place, and time. He appears well-developed and well-nourished.  Non-toxic appearance. No distress.  HENT:  Head: Normocephalic and atraumatic.  Eyes: Conjunctivae, EOM and lids are normal. Pupils are equal, round, and reactive to light.  Neck: Normal range of motion. Neck supple. No tracheal deviation present. No thyroid mass present.  Cardiovascular: Normal rate, regular rhythm and normal heart sounds.  Exam reveals no gallop.   No murmur heard. Pulmonary/Chest: Effort normal and breath sounds normal. No stridor. No respiratory distress. He has no decreased breath sounds. He  has no wheezes. He has no rhonchi. He has no rales.  Abdominal: Soft. Normal appearance and bowel sounds are normal. He exhibits no distension. There is no tenderness. There is no rebound and no CVA tenderness.  Musculoskeletal: Normal range of motion. He exhibits no edema or tenderness.  Neurological: He is alert and oriented to person, place, and time. He has normal strength. No cranial nerve deficit or sensory deficit. GCS eye subscore is 4. GCS verbal subscore is 5. GCS motor subscore is 6.  Skin: Skin is warm and  dry. No abrasion and no rash noted.  Psychiatric: He has a normal mood and affect. His speech is normal and behavior is normal.  Nursing note and vitals reviewed.   ED Course  Procedures (including critical care time) Labs Review Labs Reviewed  CBC - Abnormal; Notable for the following:    WBC 3.7 (*)    RBC 4.00 (*)    Hemoglobin 12.5 (*)    HCT 38.1 (*)    All other components within normal limits  BASIC METABOLIC PANEL - Abnormal; Notable for the following:    Glucose, Bld 144 (*)    Creatinine, Ser 1.55 (*)    Calcium 8.3 (*)    GFR calc non Af Amer 38 (*)    GFR calc Af Amer 44 (*)    All other components within normal limits  BRAIN NATRIURETIC PEPTIDE  I-STAT TROPOININ, ED    Imaging Review Dg Chest 2 View (if Patient Has Fever And/or Copd)  08/11/2014   CLINICAL DATA:  Chest pain for 1 week  EXAM: CHEST  2 VIEW  COMPARISON:  April 03, 2014, Sep 30, 2011  FINDINGS: The heart size and mediastinal contours are within normal limits. There is no focal infiltrate, pulmonary edema, or pleural effusion. Bilateral symmetrical nodule irides are identified in lung bases probably due to overlying nipple shadow. Confirmation with PA chest x-ray with nipple marker in place is recommended. Degenerative joint changes of the spine are noted.  IMPRESSION: No active cardiopulmonary disease.  Bilateral symmetrical nodule irides are identified in lung bases probably due to overlying nipple shadow. Confirmation with PA chest x-ray with nipple marker in place is recommended.   Electronically Signed   By: Abelardo Diesel M.D.   On: 08/11/2014 19:16     EKG Interpretation   Date/Time:  Monday August 11 2014 16:34:43 EDT Ventricular Rate:  96 PR Interval:  211 QRS Duration: 103 QT Interval:  369 QTC Calculation: 466 R Axis:   -34 Text Interpretation:  Sinus arrhythmia Borderline prolonged PR interval  Abnormal R-wave progression, early transition LVH with secondary  repolarization abnormality  No significant change since last tracing  Confirmed by ALLEN  MD, ANTHONY ([id number]) on 08/11/2014 7:45:58 PM      MDM   Final diagnoses:  None    Will consult try hospitals for medical admission for evaluation of his cardiac complaint    Lacretia Leigh, MD 08/11/14 2006

## 2014-08-11 NOTE — H&P (Signed)
[facility] History and Physical  Patient: Wayne Fuller  MRN: [id number]  DOB: 18-Dec-1923  DOS: the patient was seen and examined on 08/11/2014 PCP: Benito Mccreedy, MD  Chief Complaint: Chest pain  HPI: Wayne Fuller is a 81 y.o. male with Past medical history of hypertension. The patient is presenting with complaints of chest pain. The pain is located centrally as well as on the left side. The pain feels like a sharp pain. Pain has been ongoing chronically since last one year but occurring on and off. This particular episode has been occurring since yesterday. He was working outside in his yard and started having complaints of the central chest pain. Pain gets worse with deep breath. Pain and feels like his prior admission. Patient denies any complaints of recent travel, cough, fever, chills, nausea, vomiting, abdominal pain, diarrhea, burning urination, leg swelling, leg tenderness. He mentions he is taking all his medications regularly.  The patient is coming from home. And at his baseline independent for most of his ADL.  Review of Systems: as mentioned in the history of present illness.  A Comprehensive review of the other systems is negative.  Past Medical History  Diagnosis Date  . Hypertension    Past Surgical History  Procedure Laterality Date  . Back surgery    . Cervical spine surgery     Social History:  reports that he has never smoked. He does not have any smokeless tobacco history on file. He reports that he does not drink alcohol or use illicit drugs.  No Known Allergies  No family history on file.  Prior to Admission medications   Medication Sig Start Date End Date Taking? Authorizing Provider  aspirin EC 81 MG tablet Take 81 mg by mouth daily.    Historical Provider, MD  cholecalciferol (VITAMIN D) 1000 UNITS tablet Take 1 tablet (1,000 Units total) by mouth daily. 04/05/14   Silver Huguenin Elgergawy, MD  diltiazem (CARDIZEM CD) 240 MG 24 hr capsule  Take 240 mg by mouth daily.    Historical Provider, MD  doxazosin (CARDURA) 2 MG tablet Take 2 mg by mouth daily.  03/12/14   Historical Provider, MD  folic acid (FOLVITE) 1 MG tablet Take 1 mg by mouth daily.  03/10/14   Historical Provider, MD  hydrochlorothiazide (HYDRODIURIL) 25 MG tablet Take 1 tablet (25 mg total) by mouth daily. 04/05/14   Silver Huguenin Elgergawy, MD  lansoprazole (PREVACID) 30 MG capsule Take 30 mg by mouth daily.    Historical Provider, MD  metoprolol (LOPRESSOR) 100 MG tablet Take 1 tablet (100 mg total) by mouth 2 (two) times daily. 04/05/14   Albertine Patricia, MD  Multiple Vitamin (MULTIVITAMIN WITH MINERALS) TABS tablet Take 1 tablet by mouth daily.    Historical Provider, MD  Tamsulosin HCl (FLOMAX) 0.4 MG CAPS Take 0.4 mg by mouth daily.     Historical Provider, MD    Physical Exam: Filed Vitals:   08/11/14 1636  BP: 171/67  Pulse: 97  Temp: 98.2 F (36.8 C)  TempSrc: Oral  Resp: 23  SpO2: 99%    General: Alert, Awake and Oriented to Time, Place and Person. Appear in mild distress Eyes: PERRL ENT: Oral Mucosa clear moist. Neck: no JVD Cardiovascular: S1 and S2 Present, no Murmur, Peripheral Pulses Present Respiratory: Bilateral Air entry equal and Decreased, Clear to Auscultation, noCrackles, no wheezes Abdomen: Bowel Sound present, Soft and non tender Skin: no Rash Extremities: no Pedal edema, no calf tenderness Neurologic: Grossly no focal neuro  deficit.  Labs on Admission:  CBC:  Recent Labs Lab 08/11/14 1647  WBC 3.7*  HGB 12.5*  HCT 38.1*  MCV 95.3  PLT 191    CMP     Component Value Date/Time   NA 137 08/11/2014 1647   K 3.5 08/11/2014 1647   CL 107 08/11/2014 1647   CO2 23 08/11/2014 1647   GLUCOSE 144* 08/11/2014 1647   BUN 16 08/11/2014 1647   CREATININE 1.55* 08/11/2014 1647   CALCIUM 8.3* 08/11/2014 1647   PROT 6.0 04/04/2014 0335   ALBUMIN 3.1* 04/04/2014 0335   AST 20 04/04/2014 0335   ALT 10 04/04/2014 0335    ALKPHOS 77 04/04/2014 0335   BILITOT 0.4 04/04/2014 0335   GFRNONAA 38* 08/11/2014 1647   GFRAA 44* 08/11/2014 1647    No results for input(s): LIPASE, AMYLASE in the last 168 hours.  No results for input(s): CKTOTAL, CKMB, CKMBINDEX, TROPONINI in the last 168 hours. BNP (last 3 results)  Recent Labs  08/11/14 1647  BNP 53.1    ProBNP (last 3 results) No results for input(s): PROBNP in the last 8760 hours.   Radiological Exams on Admission: Dg Chest 2 View (if Patient Has Fever And/or Copd)  08/11/2014   CLINICAL DATA:  Chest pain for 1 week  EXAM: CHEST  2 VIEW  COMPARISON:  April 03, 2014, Sep 30, 2011  FINDINGS: The heart size and mediastinal contours are within normal limits. There is no focal infiltrate, pulmonary edema, or pleural effusion. Bilateral symmetrical nodule irides are identified in lung bases probably due to overlying nipple shadow. Confirmation with PA chest x-ray with nipple marker in place is recommended. Degenerative joint changes of the spine are noted.  IMPRESSION: No active cardiopulmonary disease.  Bilateral symmetrical nodule irides are identified in lung bases probably due to overlying nipple shadow. Confirmation with PA chest x-ray with nipple marker in place is recommended.   Electronically Signed   By: Abelardo Diesel M.D.   On: 08/11/2014 19:16    EKG: Independently reviewed. normal sinus rhythm, nonspecific ST and T waves changes.  Assessment/Plan Principal Problem:   Chest pain Active Problems:   Essential hypertension   CAD (coronary artery disease)   CKD (chronic kidney disease) stage 3, GFR 30-59 ml/min   GERD (gastroesophageal reflux disease)   1. Chest pain The patient is presenting with complaints of chest pain ongoing on and off since last 2 days and chronic anemia occurring since last one year. Patient was admitted in November 15 with similar complaint and stress test was negative. Currently workup including chest x-ray, troponin,  BNP, EKG does not show any evidence of acute ischemia. We will be admitted in telemetry. Continue close monitoring of troponin  2. Accelerated hypertension. Mild renal insufficiency. Patient will be continued on his home medications. We'll also use nitroglycerin as well as a hydralazine for blood pressure control. Monitor BMP again in the morning. Daily weight.  3. GERD. Continue PPI.  4. BPH. Continue Flomax.  Advance goals of care discussion: Full code  DVT Prophylaxis: subcutaneous Heparin. Nutrition: Nothing by mouth except medications after midnight  Family Communication: Family was present at bedside, opportunity was given to ask question and all questions were answered satisfactorily at the time of interview. Disposition: Admitted to observation in telemetry unit.  Author: Berle Mull, MD [facility] Pager: [phone] 08/11/2014, 8:39 PM    If 7PM-7AM, please contact night-coverage [email or web address] Password [email or web address]

## 2014-08-11 NOTE — ED Notes (Signed)
Pt sent over by PCP for irregular EKG, pt c/o chest pain for a couple days states it has gotten worse today. States he has felt SOB but not at the moment.

## 2014-08-12 DIAGNOSIS — R0789 Other chest pain: Secondary | ICD-10-CM | POA: Diagnosis not present

## 2014-08-12 DIAGNOSIS — I10 Essential (primary) hypertension: Secondary | ICD-10-CM | POA: Diagnosis not present

## 2014-08-12 DIAGNOSIS — N183 Chronic kidney disease, stage 3 (moderate): Secondary | ICD-10-CM | POA: Diagnosis not present

## 2014-08-12 DIAGNOSIS — K219 Gastro-esophageal reflux disease without esophagitis: Secondary | ICD-10-CM | POA: Diagnosis not present

## 2014-08-12 LAB — COMPREHENSIVE METABOLIC PANEL
ALT: 10 U/L (ref 0–53)
AST: 17 U/L (ref 0–37)
Albumin: 3.5 g/dL (ref 3.5–5.2)
Alkaline Phosphatase: 66 U/L (ref 39–117)
Anion gap: 6 (ref 5–15)
BUN: 14 mg/dL (ref 6–23)
CO2: 25 mmol/L (ref 19–32)
Calcium: 8.5 mg/dL (ref 8.4–10.5)
Chloride: 111 mmol/L (ref 96–112)
Creatinine, Ser: 1.39 mg/dL — ABNORMAL HIGH (ref 0.50–1.35)
GFR calc Af Amer: 51 mL/min — ABNORMAL LOW (ref >90)
GFR calc non Af Amer: 44 mL/min — ABNORMAL LOW (ref >90)
Glucose, Bld: 99 mg/dL (ref 70–99)
Potassium: 3.7 mmol/L (ref 3.5–5.1)
Sodium: 142 mmol/L (ref 135–145)
Total Bilirubin: 0.6 mg/dL (ref 0.3–1.2)
Total Protein: 6.1 g/dL (ref 6.0–8.3)

## 2014-08-12 LAB — CBC WITH DIFFERENTIAL/PLATELET
Basophils Absolute: 0 10*3/uL (ref 0.0–0.1)
Basophils Relative: 0 % (ref 0–1)
Eosinophils Absolute: 0 10*3/uL (ref 0.0–0.7)
Eosinophils Relative: 1 % (ref 0–5)
HCT: 34.1 % — ABNORMAL LOW (ref 39.0–52.0)
Hemoglobin: 11.3 g/dL — ABNORMAL LOW (ref 13.0–17.0)
Lymphocytes Relative: 33 % (ref 12–46)
Lymphs Abs: 1 10*3/uL (ref 0.7–4.0)
MCH: 31.4 pg (ref 26.0–34.0)
MCHC: 33.1 g/dL (ref 30.0–36.0)
MCV: 94.7 fL (ref 78.0–100.0)
Monocytes Absolute: 0.3 10*3/uL (ref 0.1–1.0)
Monocytes Relative: 9 % (ref 3–12)
Neutro Abs: 1.7 10*3/uL (ref 1.7–7.7)
Neutrophils Relative %: 57 % (ref 43–77)
Platelets: 189 10*3/uL (ref 150–400)
RBC: 3.6 MIL/uL — ABNORMAL LOW (ref 4.22–5.81)
RDW: 13.2 % (ref 11.5–15.5)
WBC: 3 10*3/uL — ABNORMAL LOW (ref 4.0–10.5)

## 2014-08-12 LAB — TROPONIN I: Troponin I: 0.03 ng/mL (ref <0.031)

## 2014-08-12 LAB — PROTIME-INR
INR: 1.11 (ref 0.00–1.49)
Prothrombin Time: 14.4 seconds (ref 11.6–15.2)

## 2014-08-12 LAB — D-DIMER, QUANTITATIVE: D-Dimer, Quant: 0.42 ug/mL-FEU (ref 0.00–0.48)

## 2014-08-12 MED ORDER — ENSURE ENLIVE PO LIQD: 237.0000 mL | Freq: Two times a day (BID) | ORAL | Status: DC

## 2014-08-12 NOTE — Discharge Instructions (Signed)

## 2014-08-12 NOTE — Progress Notes (Signed)
UR completed 

## 2014-08-12 NOTE — Discharge Summary (Signed)
Physician Discharge Summary  Wayne Fuller [id number] DOB: [birth year] DOA: 08/11/2014  PCP: Benito Mccreedy, MD  Admit date: 08/11/2014 Discharge date: 08/12/2014  Recommendations for Outpatient Follow-up:  1. Follow up with PCP in 1 week to make sure symptoms are stable.  2. No new changes in medications.   Discharge Diagnoses:  Principal Problem:   Chest pain Active Problems:   Essential hypertension   CAD (coronary artery disease)   CKD (chronic kidney disease) stage 3, GFR 30-59 ml/min   GERD (gastroesophageal reflux disease)   Chest pain at rest    Discharge Condition: stable   Diet recommendation: as tolerated   History of present illness:  81 y.o. male with past medical history of hypertension who presented to [facility] ED with complaints of chest pain for past 2 days PTA, sharp, constant on left side, non radiating, worse with movement. No associated shortness of breath, no palpitations. No lightheadedness or loss of consciousness.  In ED, pt was hemodynamically stable. Troponin level WNL.  Hospital Course:   Chest pain - Troponin WNL - No acute ischemic changes on 12 lead EKG - Pain resolved - Likely musculoskeletal - stable for discharge   Accelerated hypertension - BP 136/63 now - Continue diltiazem, hydralazine    GERD - Continue PPI.  BPH - Continue Flomax.   SignedLeisa Lenz, MD  [facility] 08/12/2014, 10:36 AM  Pager #: [phone]  Procedures:  None   Consultations:  None   Discharge Exam: Filed Vitals:   08/12/14 0453  BP: 136/63  Pulse: 83  Temp: 98.4 F (36.9 C)  Resp: 20   Filed Vitals:   08/11/14 2130 08/11/14 2240 08/12/14 0009 08/12/14 0453  BP: 190/83 190/76 144/63 136/63  Pulse: 68 68  83  Temp:  98.9 F (37.2 C)  98.4 F (36.9 C)  TempSrc:  Oral  Oral  Resp: 21 18  20   Height:  5\' 10"  (1.778 m)    Weight:  72.1 kg (158 lb 15.2 oz)    SpO2: 98% 100%  96%    General: Pt is alert, follows  commands appropriately, not in acute distress Cardiovascular: Regular rate and rhythm, S1/S2 +, no murmurs Respiratory: Clear to auscultation bilaterally, no wheezing, no crackles, no rhonchi Abdominal: Soft, non tender, non distended, bowel sounds +, no guarding Extremities: no edema, no cyanosis, pulses palpable bilaterally DP and PT Neuro: Grossly nonfocal  Discharge Instructions  Discharge Instructions    Call MD for:  difficulty breathing, headache or visual disturbances    Complete by:  As directed      Call MD for:  persistant nausea and vomiting    Complete by:  As directed      Call MD for:  severe uncontrolled pain    Complete by:  As directed      Diet - low sodium heart healthy    Complete by:  As directed      Increase activity slowly    Complete by:  As directed             Medication List    TAKE these medications        aspirin EC 81 MG tablet  Take 81 mg by mouth daily.     diltiazem 240 MG 24 hr capsule  Commonly known as:  CARDIZEM CD  Take 240 mg by mouth daily.     feeding supplement (ENSURE ENLIVE) Liqd  Take 237 mLs by mouth 2 (two) times daily between meals.  hydrALAZINE 50 MG tablet  Commonly known as:  APRESOLINE  Take 50 mg by mouth 2 (two) times daily.     lansoprazole 30 MG capsule  Commonly known as:  PREVACID  Take 30 mg by mouth daily.     orphenadrine 100 MG tablet  Commonly known as:  NORFLEX  Take 100 mg by mouth 2 (two) times daily as needed for muscle spasms.     tamsulosin 0.4 MG Caps capsule  Commonly known as:  FLOMAX  Take 0.4 mg by mouth daily.           Follow-up Information    Follow up with OSEI-BONSU,GEORGE, MD. Schedule an appointment as soon as possible for a visit in 1 week.   Specialty:  Internal Medicine   Why:  Follow up appt after recent hospitalization   Contact information:   [id number] [place] [address] [phone]        The results of significant diagnostics from  this hospitalization (including imaging, microbiology, ancillary and laboratory) are listed below for reference.    Significant Diagnostic Studies: Dg Chest 2 View (if Patient Has Fever And/or Copd)  08/11/2014   CLINICAL DATA:  Chest pain for 1 week  EXAM: CHEST  2 VIEW  COMPARISON:  April 03, 2014, Sep 30, 2011  FINDINGS: The heart size and mediastinal contours are within normal limits. There is no focal infiltrate, pulmonary edema, or pleural effusion. Bilateral symmetrical nodule irides are identified in lung bases probably due to overlying nipple shadow. Confirmation with PA chest x-ray with nipple marker in place is recommended. Degenerative joint changes of the spine are noted.  IMPRESSION: No active cardiopulmonary disease.  Bilateral symmetrical nodule irides are identified in lung bases probably due to overlying nipple shadow. Confirmation with PA chest x-ray with nipple marker in place is recommended.   Electronically Signed   By: Abelardo Diesel M.D.   On: 08/11/2014 19:16    Microbiology: No results found for this or any previous visit (from the past 240 hour(s)).   Labs: Basic Metabolic Panel:  Recent Labs Lab 08/11/14 1647 08/12/14 0715  NA 137 142  K 3.5 3.7  CL 107 111  CO2 23 25  GLUCOSE 144* 99  BUN 16 14  CREATININE 1.55* 1.39*  CALCIUM 8.3* 8.5   Liver Function Tests:  Recent Labs Lab 08/12/14 0715  AST 17  ALT 10  ALKPHOS 66  BILITOT 0.6  PROT 6.1  ALBUMIN 3.5   No results for input(s): LIPASE, AMYLASE in the last 168 hours. No results for input(s): AMMONIA in the last 168 hours. CBC:  Recent Labs Lab 08/11/14 1647 08/12/14 0715  WBC 3.7* 3.0*  NEUTROABS  --  1.7  HGB 12.5* 11.3*  HCT 38.1* 34.1*  MCV 95.3 94.7  PLT 191 189   Cardiac Enzymes:  Recent Labs Lab 08/12/14 0715  TROPONINI 0.03   BNP: BNP (last 3 results)  Recent Labs  08/11/14 1647  BNP 53.1    ProBNP (last 3 results) No results for input(s): PROBNP in the last  8760 hours.  CBG: No results for input(s): GLUCAP in the last 168 hours.  Time coordinating discharge: Over 30 minutes

## 2014-08-12 NOTE — Progress Notes (Signed)
INITIAL NUTRITION ASSESSMENT  DOCUMENTATION CODES Per approved criteria  -Not Applicable   INTERVENTION: Continue Ensure Enlive po BID, each supplement provides 350 kcal and 20 grams of protein RD assisted pt with ordering lunch Encourage PO intake RD to continue to monitor  NUTRITION DIAGNOSIS: Inadequate oral intake related to poor appetite as evidenced by pt refusing to order meals.   Goal: Pt to meet >/= 90% of their estimated nutrition needs   Monitor:  Diet advancement weight, labs, I/O's  Reason for Assessment: Pt identified as at nutrition risk on the Malnutrition Screen Tool  Admitting Dx: Chest pain  ASSESSMENT: 81 y.o. male presenting with chest pain with Past medical history of hypertension.  Pt in room with no family present. Pt confused at times when answering questions. When asked UBW, pt replied with his diet history.   Pt states he in uninterested in ordering lunch at this time. Pt reports he was asked what he wanted to eat and he declined d/t not feeling hungry.  RD encouraged pt to try something for lunch. RD was able to get a lunch order. RD to place order for pt. RD to place pt on meal order with assist.  Pt unsure of nutritional supplements but states he will try a vanilla Ensure. RD to monitor PO and supplemental intake.   Pt with stable weight per weight history documentation.  Pt with moderate depletion of clavicles and mild signs of depletion in temporal region and hands.  Labs reviewed: Elevated Creatinine  Height: Ht Readings from Last 1 Encounters:  08/11/14 5\' 10"  (1.778 m)    Weight: Wt Readings from Last 1 Encounters:  08/11/14 158 lb 15.2 oz (72.1 kg)    Ideal Body Weight: 166 lb  % Ideal Body Weight: 95%  Wt Readings from Last 10 Encounters:  08/11/14 158 lb 15.2 oz (72.1 kg)  04/05/14 153 lb 10.6 oz (69.7 kg)  07/25/13 160 lb (72.576 kg)  12/11/12 170 lb (77.111 kg)  05/05/09 183 lb (83.008 kg)    Usual Body Weight:  unable to obtain from pt. Per weight history ~183 lb  % Usual Body Weight: 86%  BMI:  Body mass index is 22.81 kg/(m^2).  Estimated Nutritional Needs: Kcal: 1800-2000 Protein: 90-100g Fluid: 1.8L/day  Skin: intact  Diet Order: Diet - low sodium heart healthy Diet regular  EDUCATION NEEDS: -No education needs identified at this time   Intake/Output Summary (Last 24 hours) at 08/12/14 1116 Last data filed at 08/12/14 1038  Gross per 24 hour  Intake    120 ml  Output      3 ml  Net    117 ml    Last BM: 3/20  Labs:   Recent Labs Lab 08/11/14 1647 08/12/14 0715  NA 137 142  K 3.5 3.7  CL 107 111  CO2 23 25  BUN 16 14  CREATININE 1.55* 1.39*  CALCIUM 8.3* 8.5  GLUCOSE 144* 99    CBG (last 3)  No results for input(s): GLUCAP in the last 72 hours.  Scheduled Meds: . aspirin EC  81 mg Oral Daily  . diltiazem  240 mg Oral Daily  . feeding supplement (ENSURE ENLIVE)  237 mL Oral BID BM  . heparin  5,000 Units Subcutaneous 3 times per day  . metoprolol  100 mg Oral BID  . nitroGLYCERIN  0.5 inch Topical 4 times per day  . pantoprazole  40 mg Oral Daily  . tamsulosin  0.4 mg Oral Daily  Continuous Infusions:   Past Medical History  Diagnosis Date  . Hypertension     Past Surgical History  Procedure Laterality Date  . Back surgery    . Cervical spine surgery      Clayton Bibles, MS, RD, LDN Pager: [phone] After Hours Pager: [phone]

## 2014-08-12 NOTE — Progress Notes (Cosign Needed)
enterred erroneously

## 2014-08-22 DIAGNOSIS — I119 Hypertensive heart disease without heart failure: Secondary | ICD-10-CM | POA: Diagnosis not present

## 2014-08-22 DIAGNOSIS — K219 Gastro-esophageal reflux disease without esophagitis: Secondary | ICD-10-CM | POA: Diagnosis not present

## 2014-08-22 DIAGNOSIS — I2510 Atherosclerotic heart disease of native coronary artery without angina pectoris: Secondary | ICD-10-CM | POA: Diagnosis not present

## 2014-08-22 DIAGNOSIS — M5135 Other intervertebral disc degeneration, thoracolumbar region: Secondary | ICD-10-CM | POA: Diagnosis not present

## 2014-08-22 DIAGNOSIS — I10 Essential (primary) hypertension: Secondary | ICD-10-CM | POA: Diagnosis not present

## 2014-09-01 DIAGNOSIS — Z9181 History of falling: Secondary | ICD-10-CM | POA: Diagnosis not present

## 2014-09-01 DIAGNOSIS — N400 Enlarged prostate without lower urinary tract symptoms: Secondary | ICD-10-CM | POA: Diagnosis not present

## 2014-09-01 DIAGNOSIS — Z7982 Long term (current) use of aspirin: Secondary | ICD-10-CM | POA: Diagnosis not present

## 2014-09-01 DIAGNOSIS — I739 Peripheral vascular disease, unspecified: Secondary | ICD-10-CM | POA: Diagnosis not present

## 2014-09-01 DIAGNOSIS — N183 Chronic kidney disease, stage 3 (moderate): Secondary | ICD-10-CM | POA: Diagnosis not present

## 2014-09-01 DIAGNOSIS — M5135 Other intervertebral disc degeneration, thoracolumbar region: Secondary | ICD-10-CM | POA: Diagnosis not present

## 2014-09-01 DIAGNOSIS — K219 Gastro-esophageal reflux disease without esophagitis: Secondary | ICD-10-CM | POA: Diagnosis not present

## 2014-09-01 DIAGNOSIS — I1310 Hypertensive heart and chronic kidney disease without heart failure, with stage 1 through stage 4 chronic kidney disease, or unspecified chronic kidney disease: Secondary | ICD-10-CM | POA: Diagnosis not present

## 2014-09-01 DIAGNOSIS — I2510 Atherosclerotic heart disease of native coronary artery without angina pectoris: Secondary | ICD-10-CM | POA: Diagnosis not present

## 2014-09-02 DIAGNOSIS — K219 Gastro-esophageal reflux disease without esophagitis: Secondary | ICD-10-CM | POA: Diagnosis not present

## 2014-09-02 DIAGNOSIS — Z7982 Long term (current) use of aspirin: Secondary | ICD-10-CM | POA: Diagnosis not present

## 2014-09-02 DIAGNOSIS — I2510 Atherosclerotic heart disease of native coronary artery without angina pectoris: Secondary | ICD-10-CM | POA: Diagnosis not present

## 2014-09-02 DIAGNOSIS — M5135 Other intervertebral disc degeneration, thoracolumbar region: Secondary | ICD-10-CM | POA: Diagnosis not present

## 2014-09-02 DIAGNOSIS — I1310 Hypertensive heart and chronic kidney disease without heart failure, with stage 1 through stage 4 chronic kidney disease, or unspecified chronic kidney disease: Secondary | ICD-10-CM | POA: Diagnosis not present

## 2014-09-02 DIAGNOSIS — N400 Enlarged prostate without lower urinary tract symptoms: Secondary | ICD-10-CM | POA: Diagnosis not present

## 2014-09-02 DIAGNOSIS — N183 Chronic kidney disease, stage 3 (moderate): Secondary | ICD-10-CM | POA: Diagnosis not present

## 2014-09-02 DIAGNOSIS — Z9181 History of falling: Secondary | ICD-10-CM | POA: Diagnosis not present

## 2014-09-02 DIAGNOSIS — I739 Peripheral vascular disease, unspecified: Secondary | ICD-10-CM | POA: Diagnosis not present

## 2014-09-04 DIAGNOSIS — Z9181 History of falling: Secondary | ICD-10-CM | POA: Diagnosis not present

## 2014-09-04 DIAGNOSIS — Z7982 Long term (current) use of aspirin: Secondary | ICD-10-CM | POA: Diagnosis not present

## 2014-09-04 DIAGNOSIS — I1310 Hypertensive heart and chronic kidney disease without heart failure, with stage 1 through stage 4 chronic kidney disease, or unspecified chronic kidney disease: Secondary | ICD-10-CM | POA: Diagnosis not present

## 2014-09-04 DIAGNOSIS — K219 Gastro-esophageal reflux disease without esophagitis: Secondary | ICD-10-CM | POA: Diagnosis not present

## 2014-09-04 DIAGNOSIS — N183 Chronic kidney disease, stage 3 (moderate): Secondary | ICD-10-CM | POA: Diagnosis not present

## 2014-09-04 DIAGNOSIS — M5135 Other intervertebral disc degeneration, thoracolumbar region: Secondary | ICD-10-CM | POA: Diagnosis not present

## 2014-09-04 DIAGNOSIS — I2510 Atherosclerotic heart disease of native coronary artery without angina pectoris: Secondary | ICD-10-CM | POA: Diagnosis not present

## 2014-09-04 DIAGNOSIS — N400 Enlarged prostate without lower urinary tract symptoms: Secondary | ICD-10-CM | POA: Diagnosis not present

## 2014-09-04 DIAGNOSIS — I739 Peripheral vascular disease, unspecified: Secondary | ICD-10-CM | POA: Diagnosis not present

## 2014-09-18 DIAGNOSIS — I2510 Atherosclerotic heart disease of native coronary artery without angina pectoris: Secondary | ICD-10-CM | POA: Diagnosis not present

## 2014-09-18 DIAGNOSIS — R079 Chest pain, unspecified: Secondary | ICD-10-CM | POA: Diagnosis not present

## 2014-09-18 DIAGNOSIS — R0602 Shortness of breath: Secondary | ICD-10-CM | POA: Diagnosis not present

## 2014-09-22 DIAGNOSIS — R002 Palpitations: Secondary | ICD-10-CM | POA: Diagnosis not present

## 2014-09-22 DIAGNOSIS — I351 Nonrheumatic aortic (valve) insufficiency: Secondary | ICD-10-CM | POA: Diagnosis not present

## 2014-09-22 DIAGNOSIS — I340 Nonrheumatic mitral (valve) insufficiency: Secondary | ICD-10-CM | POA: Diagnosis not present

## 2014-11-07 DIAGNOSIS — R079 Chest pain, unspecified: Secondary | ICD-10-CM | POA: Diagnosis not present

## 2014-11-07 DIAGNOSIS — I119 Hypertensive heart disease without heart failure: Secondary | ICD-10-CM | POA: Diagnosis not present

## 2014-11-07 DIAGNOSIS — I2510 Atherosclerotic heart disease of native coronary artery without angina pectoris: Secondary | ICD-10-CM | POA: Diagnosis not present

## 2014-11-07 DIAGNOSIS — I10 Essential (primary) hypertension: Secondary | ICD-10-CM | POA: Diagnosis not present

## 2014-11-07 DIAGNOSIS — I25119 Atherosclerotic heart disease of native coronary artery with unspecified angina pectoris: Secondary | ICD-10-CM | POA: Diagnosis not present

## 2014-11-07 DIAGNOSIS — M5135 Other intervertebral disc degeneration, thoracolumbar region: Secondary | ICD-10-CM | POA: Diagnosis not present

## 2014-11-07 DIAGNOSIS — K219 Gastro-esophageal reflux disease without esophagitis: Secondary | ICD-10-CM | POA: Diagnosis not present

## 2014-12-22 DIAGNOSIS — I2510 Atherosclerotic heart disease of native coronary artery without angina pectoris: Secondary | ICD-10-CM | POA: Diagnosis not present

## 2014-12-22 DIAGNOSIS — M5135 Other intervertebral disc degeneration, thoracolumbar region: Secondary | ICD-10-CM | POA: Diagnosis not present

## 2014-12-22 DIAGNOSIS — I10 Essential (primary) hypertension: Secondary | ICD-10-CM | POA: Diagnosis not present

## 2014-12-22 DIAGNOSIS — K219 Gastro-esophageal reflux disease without esophagitis: Secondary | ICD-10-CM | POA: Diagnosis not present

## 2014-12-22 DIAGNOSIS — I119 Hypertensive heart disease without heart failure: Secondary | ICD-10-CM | POA: Diagnosis not present

## 2014-12-30 DIAGNOSIS — I2510 Atherosclerotic heart disease of native coronary artery without angina pectoris: Secondary | ICD-10-CM | POA: Diagnosis not present

## 2015-01-15 DIAGNOSIS — R351 Nocturia: Secondary | ICD-10-CM | POA: Diagnosis not present

## 2015-01-15 DIAGNOSIS — N401 Enlarged prostate with lower urinary tract symptoms: Secondary | ICD-10-CM | POA: Diagnosis not present

## 2015-01-27 DIAGNOSIS — M5135 Other intervertebral disc degeneration, thoracolumbar region: Secondary | ICD-10-CM | POA: Diagnosis not present

## 2015-01-27 DIAGNOSIS — K219 Gastro-esophageal reflux disease without esophagitis: Secondary | ICD-10-CM | POA: Diagnosis not present

## 2015-01-27 DIAGNOSIS — I10 Essential (primary) hypertension: Secondary | ICD-10-CM | POA: Diagnosis not present

## 2015-01-27 DIAGNOSIS — I2510 Atherosclerotic heart disease of native coronary artery without angina pectoris: Secondary | ICD-10-CM | POA: Diagnosis not present

## 2015-01-27 DIAGNOSIS — I119 Hypertensive heart disease without heart failure: Secondary | ICD-10-CM | POA: Diagnosis not present

## 2015-02-18 DIAGNOSIS — I498 Other specified cardiac arrhythmias: Secondary | ICD-10-CM | POA: Diagnosis not present

## 2015-02-18 DIAGNOSIS — R0789 Other chest pain: Secondary | ICD-10-CM | POA: Diagnosis not present

## 2015-02-18 DIAGNOSIS — R002 Palpitations: Secondary | ICD-10-CM | POA: Diagnosis not present

## 2015-02-18 DIAGNOSIS — I119 Hypertensive heart disease without heart failure: Secondary | ICD-10-CM | POA: Diagnosis not present

## 2015-02-20 DIAGNOSIS — R9431 Abnormal electrocardiogram [ECG] [EKG]: Secondary | ICD-10-CM | POA: Diagnosis not present

## 2015-02-20 DIAGNOSIS — R0789 Other chest pain: Secondary | ICD-10-CM | POA: Diagnosis not present

## 2015-02-25 DIAGNOSIS — R1900 Intra-abdominal and pelvic swelling, mass and lump, unspecified site: Secondary | ICD-10-CM | POA: Diagnosis not present

## 2015-03-05 DIAGNOSIS — I119 Hypertensive heart disease without heart failure: Secondary | ICD-10-CM | POA: Diagnosis not present

## 2015-03-05 DIAGNOSIS — I498 Other specified cardiac arrhythmias: Secondary | ICD-10-CM | POA: Diagnosis not present

## 2015-03-05 DIAGNOSIS — R0789 Other chest pain: Secondary | ICD-10-CM | POA: Diagnosis not present

## 2015-03-05 DIAGNOSIS — R002 Palpitations: Secondary | ICD-10-CM | POA: Diagnosis not present

## 2015-04-02 ENCOUNTER — Encounter (HOSPITAL_COMMUNITY): Payer: Self-pay | Admitting: Emergency Medicine

## 2015-04-02 ENCOUNTER — Observation Stay (HOSPITAL_COMMUNITY)
Admission: EM | Admit: 2015-04-02 | Discharge: 2015-04-03 | Disposition: A | Discharge status: Home / Self Care | Payer: Medicare Other | Attending: Cardiology | Admitting: Cardiology

## 2015-04-02 ENCOUNTER — Emergency Department (HOSPITAL_COMMUNITY): Payer: Medicare Other

## 2015-04-02 DIAGNOSIS — R0789 Other chest pain: Secondary | ICD-10-CM | POA: Diagnosis not present

## 2015-04-02 DIAGNOSIS — I10 Essential (primary) hypertension: Secondary | ICD-10-CM | POA: Diagnosis not present

## 2015-04-02 DIAGNOSIS — R002 Palpitations: Secondary | ICD-10-CM | POA: Diagnosis not present

## 2015-04-02 DIAGNOSIS — I480 Paroxysmal atrial fibrillation: Secondary | ICD-10-CM

## 2015-04-02 DIAGNOSIS — I119 Hypertensive heart disease without heart failure: Secondary | ICD-10-CM | POA: Diagnosis not present

## 2015-04-02 DIAGNOSIS — R000 Tachycardia, unspecified: Secondary | ICD-10-CM | POA: Diagnosis present

## 2015-04-02 DIAGNOSIS — R7989 Other specified abnormal findings of blood chemistry: Secondary | ICD-10-CM | POA: Diagnosis not present

## 2015-04-02 DIAGNOSIS — R778 Other specified abnormalities of plasma proteins: Secondary | ICD-10-CM | POA: Diagnosis present

## 2015-04-02 DIAGNOSIS — I4891 Unspecified atrial fibrillation: Secondary | ICD-10-CM | POA: Diagnosis not present

## 2015-04-02 DIAGNOSIS — R079 Chest pain, unspecified: Secondary | ICD-10-CM | POA: Diagnosis not present

## 2015-04-02 DIAGNOSIS — Z7982 Long term (current) use of aspirin: Secondary | ICD-10-CM | POA: Diagnosis not present

## 2015-04-02 DIAGNOSIS — I499 Cardiac arrhythmia, unspecified: Secondary | ICD-10-CM | POA: Diagnosis not present

## 2015-04-02 HISTORY — DX: Paroxysmal atrial fibrillation: I48.0

## 2015-04-02 HISTORY — DX: Chronic kidney disease, unspecified: N18.9

## 2015-04-02 LAB — CBC WITH DIFFERENTIAL/PLATELET
Basophils Absolute: 0 10*3/uL (ref 0.0–0.1)
Basophils Relative: 0 %
Eosinophils Absolute: 0 10*3/uL (ref 0.0–0.7)
Eosinophils Relative: 0 %
HCT: 37.5 % — ABNORMAL LOW (ref 39.0–52.0)
Hemoglobin: 12.2 g/dL — ABNORMAL LOW (ref 13.0–17.0)
Lymphocytes Relative: 16 %
Lymphs Abs: 0.6 10*3/uL — ABNORMAL LOW (ref 0.7–4.0)
MCH: 31.1 pg (ref 26.0–34.0)
MCHC: 32.5 g/dL (ref 30.0–36.0)
MCV: 95.7 fL (ref 78.0–100.0)
Monocytes Absolute: 0.3 10*3/uL (ref 0.1–1.0)
Monocytes Relative: 9 %
Neutro Abs: 2.8 10*3/uL (ref 1.7–7.7)
Neutrophils Relative %: 75 %
Platelets: 157 10*3/uL (ref 150–400)
RBC: 3.92 MIL/uL — ABNORMAL LOW (ref 4.22–5.81)
RDW: 13.5 % (ref 11.5–15.5)
WBC: 3.8 10*3/uL — ABNORMAL LOW (ref 4.0–10.5)

## 2015-04-02 LAB — URINALYSIS, ROUTINE W REFLEX MICROSCOPIC
Bilirubin Urine: NEGATIVE
Glucose, UA: NEGATIVE mg/dL
Hgb urine dipstick: NEGATIVE
Ketones, ur: NEGATIVE mg/dL
Leukocytes, UA: NEGATIVE
Nitrite: NEGATIVE
Protein, ur: NEGATIVE mg/dL
Specific Gravity, Urine: 1.005 (ref 1.005–1.030)
Urobilinogen, UA: 0.2 mg/dL (ref 0.0–1.0)
pH: 6.5 (ref 5.0–8.0)

## 2015-04-02 LAB — PROTIME-INR
INR: 1.06 (ref 0.00–1.49)
Prothrombin Time: 14 seconds (ref 11.6–15.2)

## 2015-04-02 LAB — COMPREHENSIVE METABOLIC PANEL
ALT: 10 U/L — ABNORMAL LOW (ref 17–63)
AST: 6 U/L — ABNORMAL LOW (ref 15–41)
Albumin: 3.5 g/dL (ref 3.5–5.0)
Alkaline Phosphatase: 65 U/L (ref 38–126)
Anion gap: 7 (ref 5–15)
BUN: 12 mg/dL (ref 6–20)
CO2: 25 mmol/L (ref 22–32)
Calcium: 8.9 mg/dL (ref 8.9–10.3)
Chloride: 109 mmol/L (ref 101–111)
Creatinine, Ser: 1.78 mg/dL — ABNORMAL HIGH (ref 0.61–1.24)
GFR calc Af Amer: 37 mL/min — ABNORMAL LOW (ref >60)
GFR calc non Af Amer: 32 mL/min — ABNORMAL LOW (ref >60)
Glucose, Bld: 101 mg/dL — ABNORMAL HIGH (ref 65–99)
Potassium: 4.3 mmol/L (ref 3.5–5.1)
Sodium: 141 mmol/L (ref 135–145)
Total Bilirubin: 0.6 mg/dL (ref 0.3–1.2)
Total Protein: 6.2 g/dL — ABNORMAL LOW (ref 6.5–8.1)

## 2015-04-02 LAB — I-STAT TROPONIN, ED: Troponin i, poc: 0.13 ng/mL (ref 0.00–0.08)

## 2015-04-02 LAB — TROPONIN I: Troponin I: 0.45 ng/mL — ABNORMAL HIGH (ref <0.031)

## 2015-04-02 LAB — BRAIN NATRIURETIC PEPTIDE: B Natriuretic Peptide: 129.3 pg/mL — ABNORMAL HIGH (ref 0.0–100.0)

## 2015-04-02 MED ORDER — HEPARIN BOLUS VIA INFUSION
4000.0000 [IU] | Freq: Once | INTRAVENOUS | Status: AC
  Administered 2015-04-02: 4000 [IU] via INTRAVENOUS
  Filled 2015-04-02: qty 4000

## 2015-04-02 MED ORDER — ACETAMINOPHEN 325 MG PO TABS
650.0000 mg | ORAL_TABLET | ORAL | Status: DC | PRN
  Administered 2015-04-02: 650 mg via ORAL
  Filled 2015-04-02: qty 2

## 2015-04-02 MED ORDER — PANTOPRAZOLE SODIUM 40 MG PO TBEC
40.0000 mg | DELAYED_RELEASE_TABLET | Freq: Every day | ORAL | Status: DC
  Administered 2015-04-03: 40 mg via ORAL
  Filled 2015-04-02: qty 1

## 2015-04-02 MED ORDER — DILTIAZEM HCL ER COATED BEADS 180 MG PO CP24
180.0000 mg | ORAL_CAPSULE | Freq: Once | ORAL | Status: AC
  Administered 2015-04-02: 180 mg via ORAL
  Filled 2015-04-02: qty 1

## 2015-04-02 MED ORDER — METOPROLOL SUCCINATE ER 50 MG PO TB24
50.0000 mg | ORAL_TABLET | Freq: Every day | ORAL | Status: DC
  Administered 2015-04-03: 50 mg via ORAL
  Filled 2015-04-02: qty 1

## 2015-04-02 MED ORDER — FOLIC ACID 1 MG PO TABS
1.0000 mg | ORAL_TABLET | Freq: Every day | ORAL | Status: DC
  Administered 2015-04-03: 1 mg via ORAL
  Filled 2015-04-02: qty 1

## 2015-04-02 MED ORDER — AMIODARONE HCL 200 MG PO TABS
400.0000 mg | ORAL_TABLET | Freq: Three times a day (TID) | ORAL | Status: DC
  Administered 2015-04-02 – 2015-04-03 (×2): 400 mg via ORAL
  Filled 2015-04-02 (×2): qty 2

## 2015-04-02 MED ORDER — NITROGLYCERIN 0.4 MG SL SUBL: 0.4000 mg | SUBLINGUAL_TABLET | SUBLINGUAL | Status: DC | PRN

## 2015-04-02 MED ORDER — ONDANSETRON HCL 4 MG/2ML IJ SOLN: 4.0000 mg | Freq: Four times a day (QID) | INTRAMUSCULAR | Status: DC | PRN

## 2015-04-02 MED ORDER — TAMSULOSIN HCL 0.4 MG PO CAPS: 0.4000 mg | ORAL_CAPSULE | Freq: Every day | ORAL | Status: DC

## 2015-04-02 MED ORDER — HEPARIN (PORCINE) IN NACL 100-0.45 UNIT/ML-% IJ SOLN
1000.0000 [IU]/h | INTRAMUSCULAR | Status: DC
  Administered 2015-04-02: 1000 [IU]/h via INTRAVENOUS

## 2015-04-02 MED ORDER — AMIODARONE HCL 200 MG PO TABS
400.0000 mg | ORAL_TABLET | Freq: Three times a day (TID) | ORAL | Status: DC
  Administered 2015-04-02: 400 mg via ORAL
  Filled 2015-04-02: qty 2

## 2015-04-02 MED ORDER — AMIODARONE HCL 200 MG PO TABS: 400.0000 mg | ORAL_TABLET | Freq: Two times a day (BID) | ORAL | Status: DC

## 2015-04-02 MED ORDER — ASPIRIN 81 MG PO CHEW
324.0000 mg | CHEWABLE_TABLET | Freq: Once | ORAL | Status: AC
  Administered 2015-04-02: 324 mg via ORAL
  Filled 2015-04-02: qty 4

## 2015-04-02 MED ORDER — DOXAZOSIN MESYLATE 2 MG PO TABS
2.0000 mg | ORAL_TABLET | Freq: Every day | ORAL | Status: DC
  Administered 2015-04-03: 2 mg via ORAL
  Filled 2015-04-02 (×2): qty 1

## 2015-04-02 MED ORDER — DILTIAZEM HCL ER COATED BEADS 240 MG PO CP24
240.0000 mg | ORAL_CAPSULE | Freq: Every day | ORAL | Status: DC
  Administered 2015-04-03: 240 mg via ORAL
  Filled 2015-04-02: qty 1

## 2015-04-02 MED ORDER — HYDRALAZINE HCL 50 MG PO TABS
50.0000 mg | ORAL_TABLET | Freq: Two times a day (BID) | ORAL | Status: DC
  Administered 2015-04-02 – 2015-04-03 (×2): 50 mg via ORAL
  Filled 2015-04-02 (×2): qty 1

## 2015-04-02 NOTE — ED Provider Notes (Addendum)
CSN: [id number]     Arrival date & time 04/02/15  1347 History   First MD Initiated Contact with Patient 04/02/15 1357     Chief Complaint  Patient presents with  . Tachycardia     (Consider location/radiation/quality/duration/timing/severity/associated sxs/prior Treatment) HPI   Patient is a 81 year old male presenting with chest pain. Patient was being seen at [facility] cardiology today for feeling unwell. They found him to be in new onset A. fib. While at the appointment patient developed jaw pain, chest pain, pain with swallowing. EMS was called and brought him here to the emergency department.  Patient is difficult to understand secondary to dysarthria however his son is helpful in providing story. Patient has been feeling unwell yesterday and today. No chest pain until the appointment.  No shortness of breath. No recent plane flights or prolonged immobilization.  Past Medical History  Diagnosis Date  . Hypertension    Past Surgical History  Procedure Laterality Date  . Back surgery    . Cervical spine surgery     No family history on file. Social History  Substance Use Topics  . Smoking status: Never Smoker   . Smokeless tobacco: None  . Alcohol Use: No    Review of Systems  Constitutional: Positive for fatigue. Negative for activity change.  Respiratory: Positive for chest tightness. Negative for cough and shortness of breath.   Cardiovascular: Positive for chest pain.  Gastrointestinal: Negative for abdominal pain.  Genitourinary: Negative for dysuria and urgency.  Musculoskeletal: Negative for arthralgias.  Allergic/Immunologic: Negative for immunocompromised state.  Psychiatric/Behavioral: Negative for behavioral problems.  All other systems reviewed and are negative.     Allergies  Review of patient's allergies indicates no known allergies.  Home Medications   Prior to Admission medications   Medication Sig Start Date End Date Taking? Authorizing  Provider  aspirin EC 81 MG tablet Take 81 mg by mouth daily.    Historical Provider, MD  diltiazem (CARDIZEM CD) 240 MG 24 hr capsule Take 240 mg by mouth daily.    Historical Provider, MD  feeding supplement, ENSURE ENLIVE, (ENSURE ENLIVE) LIQD Take 237 mLs by mouth 2 (two) times daily between meals. 08/12/14   Robbie Lis, MD  hydrALAZINE (APRESOLINE) 50 MG tablet Take 50 mg by mouth 2 (two) times daily.    Historical Provider, MD  lansoprazole (PREVACID) 30 MG capsule Take 30 mg by mouth daily.    Historical Provider, MD  orphenadrine (NORFLEX) 100 MG tablet Take 100 mg by mouth 2 (two) times daily as needed for muscle spasms.    Historical Provider, MD  Tamsulosin HCl (FLOMAX) 0.4 MG CAPS Take 0.4 mg by mouth daily.     Historical Provider, MD   BP 161/72 mmHg  Pulse 117  Temp(Src) 97.8 F (36.6 C) (Oral)  Resp 21  SpO2 100% Physical Exam  Constitutional: He is oriented to person, place, and time. He appears well-nourished.  Thin frail elderly gentleman.  HENT:  Head: Normocephalic.  Mouth/Throat: Oropharynx is clear and moist.  Eyes: Conjunctivae are normal.  Neck: No tracheal deviation present.  Cardiovascular: Exam reveals no friction rub.   No murmur heard. Irregularly irregular  Pulmonary/Chest: Effort normal. No stridor. No respiratory distress.  Abdominal: Soft. There is no tenderness. There is no guarding.  Musculoskeletal: Normal range of motion. He exhibits no edema.  Neurological: He is oriented to person, place, and time. No cranial nerve deficit.  Difficult to understand due to dysarthria/edentulous  Skin: Skin is warm  and dry. No rash noted. He is not diaphoretic.  Psychiatric: He has a normal mood and affect.  Nursing note and vitals reviewed.   ED Course  Procedures (including critical care time) Labs Review Labs Reviewed  CBC WITH DIFFERENTIAL/PLATELET - Abnormal; Notable for the following:    WBC 3.8 (*)    RBC 3.92 (*)    Hemoglobin 12.2 (*)    HCT  37.5 (*)    Lymphs Abs 0.6 (*)    All other components within normal limits  URINE CULTURE  PROTIME-INR  URINALYSIS, ROUTINE W REFLEX MICROSCOPIC (NOT AT [facility])  COMPREHENSIVE METABOLIC PANEL  BRAIN NATRIURETIC PEPTIDE  I-STAT TROPOININ, ED    Imaging Review Dg Chest 2 View  04/02/2015  CLINICAL DATA:  Chest pain EXAM: CHEST  2 VIEW COMPARISON:  08/11/2014 chest radiograph. FINDINGS: Surgical hardware is partially visualized overlying the lower cervical spine. Stable cardiomediastinal silhouette with normal heart size. No pneumothorax. No pleural effusion. Clear lungs, with no focal lung consolidation and no pulmonary edema. IMPRESSION: No active cardiopulmonary disease. Electronically Signed   By: Ilona Sorrel M.D.   On: 04/02/2015 15:00   I have personally reviewed and evaluated these images and lab results as part of my medical decision-making.   EKG Interpretation   Date/Time:  Thursday April 02 2015 13:55:36 EST Ventricular Rate:  114 PR Interval:    QRS Duration: 93 QT Interval:  319 QTC Calculation: 439 R Axis:   -20 Text Interpretation:  Atrial fibrillation Borderline left axis deviation  Anteroseptal infarct, old Nonspecific T abnormalities, lateral leads  Atrial fibrillation new afib since last tracing Confirmed by Gerald Leitz ([id number]) on 04/02/2015 2:34:16 PM      MDM   Final diagnoses:  None    Patient is a 81 year old male presenting with new onset A. fib and chest pain. Patient sent here by [facility] cardiology for workup. We will rule out any source of infection that could be contributing to his new-onset A. fib. Otherwise, given his new-onset A. fib, CAD, and hypertension he likely will need admission for serial troponins and EKGs.   3:21 PM  Patient has elevated troponin. In the setting of new onset A. fib and chest pain concerning for ischemia. Aspirin given. Cardiology consult called. Patient saw [place] cardiology earlier today and was sent  here for workup.  EKG done just now show conversion to sinus rhythm.   Courteney Julio Alm, MD 04/02/15 1522  3:47 PM Discussed with Dr., Einar Gip, will admit for observation.  Will not anticoagulate at the moment because he is out of a fib.   Courteney Julio Alm, MD 04/02/15 1547

## 2015-04-02 NOTE — ED Notes (Signed)
Son reports that the cardiologist said that they would need to place the pt on a blood thinner and then perform a cardioversion at a later date.

## 2015-04-02 NOTE — H&P (Signed)
Wayne Fuller is an 81 y.o. male.   Chief Complaint: Chest pain HPI: Wayne Fuller  is a 81 y.o. male with a history of hypertension who was referred to me for evaluation of atypical chest pain. He underwent stress test which was negative for evidence of ischemia and was scheduled for follow up tomorrow. His son called the office today stating that patient had again developed chest pain and shortness of breath and appointment was moved up to today. On his initial office visit, he had been started on metoprolol due to multi-focal atrial ectopy, wandering atrial pacemaker, in addition to Cardizem that he was already on for HTN. Hydralazine was also increased at his last visit due to hypertension.  Pt noted to be in a.fib with RVR on exam. Pt had been prescribed Eliquis and amiodarone and was to be scheduled for outpatient cardioversion. However, just prior to leaving the office, he developed sudden onset headache and weakness. EMS was called to transport patient to the ED for further evaluation and rule out CVA.   Past Medical History  Diagnosis Date  . Hypertension     Past Surgical History  Procedure Laterality Date  . Back surgery    . Cervical spine surgery      No family history on file. Social History:  reports that he has never smoked. He does not have any smokeless tobacco history on file. He reports that he does not drink alcohol or use illicit drugs.  Allergies: No Known Allergies  Review of Systems - History obtained from the patient and his son General ROS: positive for  - fatigue negative for - chills or fever Hematological and Lymphatic ROS: negative for - bleeding problems or blood clots Respiratory ROS: positive for - shortness of breath negative for - cough or orthopnea Cardiovascular ROS: positive for - chest pain, dyspnea on exertion, irregular heartbeat and palpitations negative for - loss of consciousness Neurological ROS: positive for - dizziness, headaches and  weakness  Blood pressure 133/83, pulse 90, temperature 98.9 F (37.2 C), temperature source Oral, resp. rate 20, height 5' 9"  (1.753 m), weight 75.751 kg (167 lb), SpO2 98 %.  General appearance: alert, cooperative and mild distress Eyes: negative Neck: bilateral carotid bruit Resp: clear to auscultation bilaterally Chest wall: no tenderness Cardio: irregularly irregular rhythm, S1: variable, S2: normal, no S3 or S4 and systolic murmur: early systolic 2/6,   at 2nd right intercostal space, at apex GI: soft, non-tender; bowel sounds normal; no masses,  no organomegaly Extremities: edema 1+ bilaterally Pulses: 2+ and symmetric Skin: Skin color, texture, turgor normal. No rashes or lesions Neurologic: Grossly normal  Results for orders placed or performed during the hospital encounter of 04/02/15 (from the past 48 hour(s))  Urinalysis, Routine w reflex microscopic (not at [facility])     Status: None   Collection Time: 04/02/15  2:30 PM  Result Value Ref Range   Color, Urine YELLOW YELLOW   APPearance CLEAR CLEAR   Specific Gravity, Urine 1.005 1.005 - 1.030   pH 6.5 5.0 - 8.0   Glucose, UA NEGATIVE NEGATIVE mg/dL   Hgb urine dipstick NEGATIVE NEGATIVE   Bilirubin Urine NEGATIVE NEGATIVE   Ketones, ur NEGATIVE NEGATIVE mg/dL   Protein, ur NEGATIVE NEGATIVE mg/dL   Urobilinogen, UA 0.2 0.0 - 1.0 mg/dL   Nitrite NEGATIVE NEGATIVE   Leukocytes, UA NEGATIVE NEGATIVE    Comment: MICROSCOPIC NOT DONE ON URINES WITH NEGATIVE PROTEIN, BLOOD, LEUKOCYTES, NITRITE, OR GLUCOSE <1000 mg/dL.  Comprehensive metabolic  panel     Status: Abnormal   Collection Time: 04/02/15  2:40 PM  Result Value Ref Range   Sodium 141 135 - 145 mmol/L   Potassium 4.3 3.5 - 5.1 mmol/L   Chloride 109 101 - 111 mmol/L   CO2 25 22 - 32 mmol/L   Glucose, Bld 101 (H) 65 - 99 mg/dL   BUN 12 6 - 20 mg/dL   Creatinine, Ser 1.78 (H) 0.61 - 1.24 mg/dL   Calcium 8.9 8.9 - 10.3 mg/dL   Total Protein 6.2 (L) 6.5 - 8.1 g/dL    Albumin 3.5 3.5 - 5.0 g/dL   AST 6 (L) 15 - 41 U/L   ALT 10 (L) 17 - 63 U/L   Alkaline Phosphatase 65 38 - 126 U/L   Total Bilirubin 0.6 0.3 - 1.2 mg/dL   GFR calc non Af Amer 32 (L) >60 mL/min   GFR calc Af Amer 37 (L) >60 mL/min    Comment: (NOTE) The eGFR has been calculated using the CKD EPI equation. This calculation has not been validated in all clinical situations. eGFR's persistently <60 mL/min signify possible Chronic Kidney Disease.    Anion gap 7 5 - 15  CBC with Differential/Platelet     Status: Abnormal   Collection Time: 04/02/15  2:40 PM  Result Value Ref Range   WBC 3.8 (L) 4.0 - 10.5 K/uL   RBC 3.92 (L) 4.22 - 5.81 MIL/uL   Hemoglobin 12.2 (L) 13.0 - 17.0 g/dL   HCT 37.5 (L) 39.0 - 52.0 %   MCV 95.7 78.0 - 100.0 fL   MCH 31.1 26.0 - 34.0 pg   MCHC 32.5 30.0 - 36.0 g/dL   RDW 13.5 11.5 - 15.5 %   Platelets 157 150 - 400 K/uL   Neutrophils Relative % 75 %   Neutro Abs 2.8 1.7 - 7.7 K/uL   Lymphocytes Relative 16 %   Lymphs Abs 0.6 (L) 0.7 - 4.0 K/uL   Monocytes Relative 9 %   Monocytes Absolute 0.3 0.1 - 1.0 K/uL   Eosinophils Relative 0 %   Eosinophils Absolute 0.0 0.0 - 0.7 K/uL   Basophils Relative 0 %   Basophils Absolute 0.0 0.0 - 0.1 K/uL  Brain natriuretic peptide     Status: Abnormal   Collection Time: 04/02/15  2:40 PM  Result Value Ref Range   B Natriuretic Peptide 129.3 (H) 0.0 - 100.0 pg/mL  Protime-INR     Status: None   Collection Time: 04/02/15  2:40 PM  Result Value Ref Range   Prothrombin Time 14.0 11.6 - 15.2 seconds   INR 1.06 0.00 - 1.49  I-stat troponin, ED     Status: Abnormal   Collection Time: 04/02/15  3:00 PM  Result Value Ref Range   Troponin i, poc 0.13 (HH) 0.00 - 0.08 ng/mL   Comment NOTIFIED PHYSICIAN    Comment 3            Comment: Due to the release kinetics of cTnI, a negative result within the first hours of the onset of symptoms does not rule out myocardial infarction with certainty. If myocardial infarction is  still suspected, repeat the test at appropriate intervals.    Dg Chest 2 View  04/02/2015  CLINICAL DATA:  Chest pain EXAM: CHEST  2 VIEW COMPARISON:  08/11/2014 chest radiograph. FINDINGS: Surgical hardware is partially visualized overlying the lower cervical spine. Stable cardiomediastinal silhouette with normal heart size. No pneumothorax. No pleural effusion. Clear lungs,  with no focal lung consolidation and no pulmonary edema. IMPRESSION: No active cardiopulmonary disease. Electronically Signed   By: Ilona Sorrel M.D.   On: 04/02/2015 15:00    Labs:   Lab Results  Component Value Date   WBC 3.8* 04/02/2015   HGB 12.2* 04/02/2015   HCT 37.5* 04/02/2015   MCV 95.7 04/02/2015   PLT 157 04/02/2015    Recent Labs Lab 04/02/15 1440  NA 141  K 4.3  CL 109  CO2 25  BUN 12  CREATININE 1.78*  CALCIUM 8.9  PROT 6.2*  BILITOT 0.6  ALKPHOS 65  ALT 10*  AST 6*  GLUCOSE 101*    Lipid Panel  No results found for: CHOL, TRIG, HDL, CHOLHDL, VLDL, LDLCALC  BNP (last 3 results)  Recent Labs  08/11/14 1647 04/02/15 1440  BNP 53.1 129.3*    ProBNP (last 3 results) No results for input(s): PROBNP in the last 8760 hours.  HEMOGLOBIN A1C No results found for: HGBA1C, MPG  Cardiac Panel (last 3 results)  Recent Labs  04/04/14 0335 04/04/14 0650 08/12/14 0715  TROPONINI <0.30 <0.30 0.03    Lab Results  Component Value Date   TROPONINI 0.03 08/12/2014     TSH No results for input(s): TSH in the last 8760 hours.  EKG 04/02/2015 at 1355: atrial fibrillation with RVR at a rate of 114 bpm, leftward axis, LVH with repolarization abnormality, cannot exclude lateral ischemia.   EKG 04/02/2015 at 1504: Wandering atrial pacemaker, ventricular rate of 76 bpm, leftward axis, LVH with repolarization abnormality, cannot exclude lateral ischemia.   Outpatient Echocardiogram 11/07/2014:  Moderate concentric LVH with normal LV size and wall motion.  LVEF 61%.  Grade 1 diastolic  dysfunction.  Aortic and mitral valves are minimally thickened.  Mild MR, TR, TR, and AI.  Estimated RVSP 48 mmHg, moderate pulmonary hypertension.  IVC is normal with normal respiratory variation.  Outpatient Lexiscan myoview stress test 02/20/2015: 1. Resting EKG demonstrates normal sinus rhythm, ST segment depression and T-wave inversion in inferior and lateral leads. Stress EKG is nondiagnostic for ischemia as it's a pharmacologic stress test using Lexiscan. Stress symptoms included dyspnea. 2. Myocardial perfusion imaging is normal. Overall left ventricular systolic function was normal without regional wall motion abnormalities. The left ventricular ejection fraction was 61%.  Medications Prior to Admission  Medication Sig Dispense Refill  . amiodarone (PACERONE) 200 MG tablet Take 400 mg by mouth 3 (three) times daily.    Marland Kitchen apixaban (ELIQUIS) 5 MG TABS tablet Take 5 mg by mouth 2 (two) times daily.    . Cholecalciferol (VITAMIN D3) 5000 UNITS CAPS Take 5,000 Units by mouth daily.    Marland Kitchen diltiazem (CARDIZEM CD) 240 MG 24 hr capsule Take 240 mg by mouth daily.    Marland Kitchen doxazosin (CARDURA) 2 MG tablet Take 2 mg by mouth daily.  0  . folic acid (FOLVITE) 1 MG tablet Take 1 mg by mouth daily.    . hydrALAZINE (APRESOLINE) 50 MG tablet Take 50 mg by mouth 2 (two) times daily.    . metoprolol succinate (TOPROL-XL) 50 MG 24 hr tablet Take 50 mg by mouth daily.  1  . pantoprazole (PROTONIX) 40 MG tablet Take 40 mg by mouth daily.    . Tamsulosin HCl (FLOMAX) 0.4 MG CAPS Take 0.4 mg by mouth daily after supper.     . feeding supplement, ENSURE ENLIVE, (ENSURE ENLIVE) LIQD Take 237 mLs by mouth 2 (two) times daily between meals. (Patient not taking: Reported  on 04/02/2015) 237 mL 12      Current facility-administered medications:  .  acetaminophen (TYLENOL) tablet 650 mg, 650 mg, Oral, Q4H PRN, Neldon Labella, NP .  amiodarone (PACERONE) tablet 400 mg, 400 mg, Oral, TID, Neldon Labella, NP .   diltiazem (CARDIZEM CD) 24 hr capsule 240 mg, 240 mg, Oral, Daily, Neldon Labella, NP .  doxazosin (CARDURA) tablet 2 mg, 2 mg, Oral, Daily, Neldon Labella, NP .  folic acid (FOLVITE) tablet 1 mg, 1 mg, Oral, Daily, Neldon Labella, NP .  hydrALAZINE (APRESOLINE) tablet 50 mg, 50 mg, Oral, BID, Neldon Labella, NP .  metoprolol succinate (TOPROL-XL) 24 hr tablet 50 mg, 50 mg, Oral, Daily, Neldon Labella, NP .  nitroGLYCERIN (NITROSTAT) SL tablet 0.4 mg, 0.4 mg, Sublingual, Q5 Min x 3 PRN, Neldon Labella, NP .  ondansetron (ZOFRAN) injection 4 mg, 4 mg, Intravenous, Q6H PRN, Neldon Labella, NP .  pantoprazole (PROTONIX) EC tablet 40 mg, 40 mg, Oral, Daily, Neldon Labella, NP .  tamsulosin (FLOMAX) capsule 0.4 mg, 0.4 mg, Oral, QPC supper, Neldon Labella, NP    Assessment/Plan 1. New onset atrial fibrillation: CHA2DS2-VASc Score is 3 with yearly risk of stroke of 3.2%.  HAS-Bled score is 2 and estimated major bleeding in one year is 1.88-3.2% 2. Elevated troponin 3. Hypertensive heart disease  4. CKD Stage III  Recommendation: Pt admitted with chest pain and new onset atrial fibrillation with RVR.  Troponin mildly elevated, likely due to demand ischemia, however, will continue to cycle troponin to rule out ACS.  Will change heparin to Eliquis if troponin remains flat. He has been started on amiodarone for rate control.     Rachel Bo, NP-C 04/02/2015, 6:13 PM [facility] Pager: [phone] Office: [phone]

## 2015-04-02 NOTE — ED Notes (Addendum)
Per EMS, pt went to his cardiologists due to an elevated HR (110 A-fib) he began having neck pain, jaw pain and a headache while being evaluated at his cardiologist so they sent him over here for further evaluation. Pt alert to self, situation and place. Pt disoriented to time. NAD at this time.

## 2015-04-02 NOTE — ED Notes (Signed)
MD at bedside. 

## 2015-04-02 NOTE — ED Notes (Signed)
Pt converted back NSR, EKG captured and given to MD.

## 2015-04-02 NOTE — Progress Notes (Addendum)
MD, please call Son Burr Clementz with update [phone] if Fritz Pickerel is not available, please call Daughter Miyon Voight [facility] [phone]. Thank you

## 2015-04-02 NOTE — Progress Notes (Signed)
ANTICOAGULATION CONSULT NOTE - Initial Consult  Pharmacy Consult for heparin Indication: chest pain/ACS  No Known Allergies  Patient Measurements: Height: 5\' 9"  (175.3 cm) Weight: 167 lb (75.751 kg) IBW/kg (Calculated) : 70.7 Heparin Dosing Weight: 75kg  Vital Signs: Temp: 98.9 F (37.2 C) (11/10 1748) Temp Source: Oral (11/10 1748) BP: 133/83 mmHg (11/10 1748) Pulse Rate: 90 (11/10 1748)  Labs:  Recent Labs  04/02/15 1440  HGB 12.2*  HCT 37.5*  PLT 157  LABPROT 14.0  INR 1.06  CREATININE 1.78*    Estimated Creatinine Clearance: 28.1 mL/min (by C-G formula based on Cr of 1.78).   Medical History: Past Medical History  Diagnosis Date  . Hypertension      Assessment: 55 YOM here with CP- was seen at outpatient cardiology office and found to have new onset AFib. Developed jaw pain and CP and brought to ED. EKG in ED showed conversion to sinus rhythm.   With slightly positive troponin at 0.13. To start heparin for ACS. Baseline Hgb 12.2, plts 157. No bleeding noted.  He has been prescribed Eliquis, but has NOT yet started taking it.  Goal of Therapy:  Heparin level 0.3-0.7 units/ml Monitor platelets by anticoagulation protocol: Yes   Plan:  -start heparin with bolus of 4000 units IV x1, then begin infusion at rate of 1000 units/hr -heparin level in 8h at 0300 -daily HL and CBC -follow for s/s bleeding and cardiology plans  Lauren D. Bajbus, PharmD, BCPS Clinical Pharmacist Pager: [phone] 04/02/2015 6:39 PM

## 2015-04-02 NOTE — Progress Notes (Signed)
Patient family member states that patient has already taken all of his daily medications "EXCEPT for the empty bottles" at the bedside that the family brought in. Patient family states "he has been out of Cardizem and Lopressor for several days".

## 2015-04-03 ENCOUNTER — Encounter (HOSPITAL_COMMUNITY): Payer: Self-pay | Admitting: General Practice

## 2015-04-03 DIAGNOSIS — R7989 Other specified abnormal findings of blood chemistry: Secondary | ICD-10-CM | POA: Diagnosis not present

## 2015-04-03 DIAGNOSIS — R0789 Other chest pain: Secondary | ICD-10-CM | POA: Diagnosis not present

## 2015-04-03 DIAGNOSIS — I10 Essential (primary) hypertension: Secondary | ICD-10-CM | POA: Diagnosis not present

## 2015-04-03 DIAGNOSIS — Z7982 Long term (current) use of aspirin: Secondary | ICD-10-CM | POA: Diagnosis not present

## 2015-04-03 LAB — BASIC METABOLIC PANEL
Anion gap: 6 (ref 5–15)
BUN: 9 mg/dL (ref 6–20)
CO2: 25 mmol/L (ref 22–32)
Calcium: 8.4 mg/dL — ABNORMAL LOW (ref 8.9–10.3)
Chloride: 110 mmol/L (ref 101–111)
Creatinine, Ser: 1.66 mg/dL — ABNORMAL HIGH (ref 0.61–1.24)
GFR calc Af Amer: 41 mL/min — ABNORMAL LOW (ref >60)
GFR calc non Af Amer: 35 mL/min — ABNORMAL LOW (ref >60)
Glucose, Bld: 94 mg/dL (ref 65–99)
Potassium: 3.5 mmol/L (ref 3.5–5.1)
Sodium: 141 mmol/L (ref 135–145)

## 2015-04-03 LAB — CBC
HCT: 32.1 % — ABNORMAL LOW (ref 39.0–52.0)
Hemoglobin: 10.5 g/dL — ABNORMAL LOW (ref 13.0–17.0)
MCH: 30.7 pg (ref 26.0–34.0)
MCHC: 32.7 g/dL (ref 30.0–36.0)
MCV: 93.9 fL (ref 78.0–100.0)
Platelets: 141 10*3/uL — ABNORMAL LOW (ref 150–400)
RBC: 3.42 MIL/uL — ABNORMAL LOW (ref 4.22–5.81)
RDW: 13.3 % (ref 11.5–15.5)
WBC: 3.1 10*3/uL — ABNORMAL LOW (ref 4.0–10.5)

## 2015-04-03 LAB — HEPARIN LEVEL (UNFRACTIONATED)
Heparin Unfractionated: 0.41 IU/mL (ref 0.30–0.70)
Heparin Unfractionated: <0.1 IU/mL — ABNORMAL LOW (ref 0.30–0.70)

## 2015-04-03 LAB — TROPONIN I
Troponin I: 0.51 ng/mL (ref <0.031)
Troponin I: 0.37 ng/mL — ABNORMAL HIGH (ref <0.031)

## 2015-04-03 LAB — URINE CULTURE: Culture: NO GROWTH

## 2015-04-03 MED ORDER — AMIODARONE HCL 200 MG PO TABS: 200.0000 mg | ORAL_TABLET | Freq: Two times a day (BID) | ORAL | Status: DC

## 2015-04-03 MED ORDER — APIXABAN 2.5 MG PO TABS: 5.0000 mg | ORAL_TABLET | Freq: Two times a day (BID) | ORAL | Status: DC

## 2015-04-03 MED ORDER — INFLUENZA VAC SPLIT QUAD 0.5 ML IM SUSY
0.5000 mL | PREFILLED_SYRINGE | INTRAMUSCULAR | Status: AC
  Administered 2015-04-03: 0.5 mL via INTRAMUSCULAR

## 2015-04-03 NOTE — Discharge Summary (Signed)
Physician Discharge Summary  Patient ID: Wayne Fuller MRN: [id number] DOB/AGE: [birth year] 81 y.o.  Admit date: 04/02/2015 Discharge date: 04/03/2015  Primary Discharge Diagnosis: Paroxysmal atrial fibrillation (CHA2DS2-VASc Score is 3 with yearly risk of stroke of 3.2%. HAS-Bled score is 2 and estimated major bleeding in one year is 1.88-3.2%)  Secondary Discharge Diagnosis 1. Wandering atrial pacemaker 2. Elevated troponin 3. Hypertensive heart disease  4. CKD Stage III  Hospital Course: Wayne Fuller is a 81 y.o. male with a history of hypertension who was referred to me for evaluation of atypical chest pain. He underwent stress test which was negative for evidence of ischemia and was scheduled for follow up tomorrow. His son called the office today stating that patient had again developed chest pain and shortness of breath and appointment was moved up to today. On his initial office visit, he had been started on metoprolol due to multi-focal atrial ectopy, wandering atrial pacemaker, in addition to Cardizem that he was already on for HTN. Hydralazine was also increased at his last visit due to hypertension.  Pt noted to be in a.fib with RVR on exam. Pt had been prescribed Eliquis and amiodarone and was to be scheduled for outpatient cardioversion. However, just prior to leaving the office, he developed sudden onset headache and weakness. EMS was called to transport patient to the ED for further evaluation and rule out CVA.   Troponin elevated but flat, likely due to demand ischemia. Symptoms resolved with conversion to sinus rhythm.  Pt had apparently been out of BB and CCB for several days.  Sitting up eating breakfast this morning. No new symptoms.   Recommendations on discharge: Continue amiodarone at 200mg  bid. Will reduce dose of Eliquis to 2.5mg  daily due increased creatine. Reinforced the importance of compliance with medications. Called son and spoke with him regarding discharge  plans.  Follow up outpatient next week.   Discharge Exam: Blood pressure 147/64, pulse 66, temperature 98.4 F (36.9 C), temperature source Oral, resp. rate 18, height 5\' 9"  (1.753 m), weight 72.666 kg (160 lb 3.2 oz), SpO2 98 %.    General appearance: alert, cooperative and no distress Eyes: negative Neck: bilateral carotid bruit Resp: clear to auscultation bilaterally Chest wall: no tenderness Cardio: S1: normal, S2: normal, no S3 or S4, frequent ectopy, and systolic murmur: early systolic 2/6, at 2nd right intercostal space and apex GI: soft, non-tender; bowel sounds normal; no masses, no organomegaly Extremities: edema 1+ bilaterally Pulses: 2+ and symmetric Skin: Skin color, texture, turgor normal. No rashes or lesions Neurologic: Grossly normal  Labs:   Lab Results  Component Value Date   WBC 3.1* 04/03/2015   HGB 10.5* 04/03/2015   HCT 32.1* 04/03/2015   MCV 93.9 04/03/2015   PLT 141* 04/03/2015    Recent Labs Lab 04/02/15 1440 04/03/15 0625  NA 141 141  K 4.3 3.5  CL 109 110  CO2 25 25  BUN 12 9  CREATININE 1.78* 1.66*  CALCIUM 8.9 8.4*  PROT 6.2*  --   BILITOT 0.6  --   ALKPHOS 65  --   ALT 10*  --   AST 6*  --   GLUCOSE 101* 94    Lipid Panel  No results found for: CHOL, TRIG, HDL, CHOLHDL, VLDL, LDLCALC  BNP (last 3 results)  Recent Labs  08/11/14 1647 04/02/15 1440  BNP 53.1 129.3*    ProBNP (last 3 results) No results for input(s): PROBNP in the last 8760 hours.  HEMOGLOBIN A1C No results  found for: HGBA1C, MPG  Cardiac Panel (last 3 results)  Recent Labs  04/02/15 1905 04/03/15 0045 04/03/15 0625  TROPONINI 0.45* 0.51* 0.37*    Lab Results  Component Value Date   TROPONINI 0.37* 04/03/2015     TSH No results for input(s): TSH in the last 8760 hours.  EKG 04/02/2015 at 1355: atrial fibrillation with RVR at a rate of 114 bpm, leftward axis, LVH with repolarization abnormality, cannot exclude lateral ischemia.   EKG  04/03/2015: Wandering atrial pacemaker, ventricular rate of 66 bpm, leftward axis, LVH with repolarization abnormality, cannot exclude lateral ischemia.    Radiology: Dg Chest 2 View  04/02/2015  CLINICAL DATA:  Chest pain EXAM: CHEST  2 VIEW COMPARISON:  08/11/2014 chest radiograph. FINDINGS: Surgical hardware is partially visualized overlying the lower cervical spine. Stable cardiomediastinal silhouette with normal heart size. No pneumothorax. No pleural effusion. Clear lungs, with no focal lung consolidation and no pulmonary edema. IMPRESSION: No active cardiopulmonary disease. Electronically Signed   By: Ilona Sorrel M.D.   On: 04/02/2015 15:00    FOLLOW UP PLANS AND APPOINTMENTS  Follow-up Information    Follow up with Adrian Prows, MD In 1 week.   Specialty:  Cardiology   Why:  call for appt   Contact information:   [address] [place] [address] [phone]         Medication List    TAKE these medications        amiodarone 200 MG tablet  Commonly known as:  PACERONE  Take 1 tablet (200 mg total) by mouth 2 (two) times daily.     apixaban 2.5 MG Tabs tablet  Commonly known as:  ELIQUIS  Take 2 tablets (5 mg total) by mouth 2 (two) times daily.     diltiazem 240 MG 24 hr capsule  Commonly known as:  CARDIZEM CD  Take 240 mg by mouth daily.     doxazosin 2 MG tablet  Commonly known as:  CARDURA  Take 2 mg by mouth daily.     feeding supplement (ENSURE ENLIVE) Liqd  Take 237 mLs by mouth 2 (two) times daily between meals.     folic acid 1 MG tablet  Commonly known as:  FOLVITE  Take 1 mg by mouth daily.     hydrALAZINE 50 MG tablet  Commonly known as:  APRESOLINE  Take 50 mg by mouth 2 (two) times daily.     metoprolol succinate 50 MG 24 hr tablet  Commonly known as:  TOPROL-XL  Take 50 mg by mouth daily.     pantoprazole 40 MG tablet  Commonly known as:  PROTONIX  Take 40 mg by mouth daily.     tamsulosin 0.4 MG Caps capsule   Commonly known as:  FLOMAX  Take 0.4 mg by mouth daily after supper.     Vitamin D3 5000 UNITS Caps  Take 5,000 Units by mouth daily.         Rachel Bo, NP-C 04/03/2015, 7:48 AM [facility] Pager: [phone] Office: [phone]

## 2015-04-03 NOTE — Progress Notes (Signed)
ANTICOAGULATION CONSULT NOTE - Follow Up Consult  Pharmacy Consult for Heparin  Indication: chest pain/ACS, new onset afib  No Known Allergies  Patient Measurements: Height: 5\' 9"  (175.3 cm) Weight: 167 lb (75.751 kg) IBW/kg (Calculated) : 70.7  Vital Signs: Temp: 98.6 F (37 C) (11/10 2055) Temp Source: Oral (11/10 2055) BP: 170/70 mmHg (11/10 2212) Pulse Rate: 88 (11/10 2212)  Labs:  Recent Labs  04/02/15 1440 04/02/15 1905 04/03/15 0045 04/03/15 0304  HGB 12.2*  --   --  10.5*  HCT 37.5*  --   --  32.1*  PLT 157  --   --  141*  LABPROT 14.0  --   --   --   INR 1.06  --   --   --   HEPARINUNFRC  --   --   --  0.41  CREATININE 1.78*  --   --   --   TROPONINI  --  0.45* 0.51*  --     Estimated Creatinine Clearance: 28.1 mL/min (by C-G formula based on Cr of 1.78).   Assessment: Heparin for CP/new onset afib, initial heparin level is therapeutic   Goal of Therapy:  Heparin level 0.3-0.7 units/ml Monitor platelets by anticoagulation protocol: Yes   Plan:  -Continue heparin at 1000 units/hr -1200 HL  Ledford, James 04/03/2015,3:49 AM

## 2015-04-03 NOTE — Progress Notes (Signed)
CRITICAL VALUE ALERT  Critical value received:  Troponin 0.51  Date of notification:  04/03/2015   Time of notification:  0148  Critical value read back:yes  Nurse who received alert:  Kerrie Buffalo  MD notified (1st page):  Dr. Einar Gip  Time of first page:  0150  Responding MD:  Dr. Einar Gip  Time MD responded:  [phone]

## 2015-04-10 DIAGNOSIS — I119 Hypertensive heart disease without heart failure: Secondary | ICD-10-CM | POA: Diagnosis not present

## 2015-04-10 DIAGNOSIS — Z7901 Long term (current) use of anticoagulants: Secondary | ICD-10-CM | POA: Diagnosis not present

## 2015-04-10 DIAGNOSIS — I4891 Unspecified atrial fibrillation: Secondary | ICD-10-CM | POA: Diagnosis not present

## 2015-04-10 DIAGNOSIS — R0789 Other chest pain: Secondary | ICD-10-CM | POA: Diagnosis not present

## 2015-04-14 DIAGNOSIS — H5203 Hypermetropia, bilateral: Secondary | ICD-10-CM | POA: Diagnosis not present

## 2015-04-14 DIAGNOSIS — H52223 Regular astigmatism, bilateral: Secondary | ICD-10-CM | POA: Diagnosis not present

## 2015-04-14 DIAGNOSIS — Z961 Presence of intraocular lens: Secondary | ICD-10-CM | POA: Diagnosis not present

## 2015-07-02 DIAGNOSIS — Z79899 Other long term (current) drug therapy: Secondary | ICD-10-CM | POA: Diagnosis not present

## 2015-07-02 DIAGNOSIS — I119 Hypertensive heart disease without heart failure: Secondary | ICD-10-CM | POA: Diagnosis not present

## 2015-07-02 DIAGNOSIS — I4891 Unspecified atrial fibrillation: Secondary | ICD-10-CM | POA: Diagnosis not present

## 2015-07-02 DIAGNOSIS — Z7901 Long term (current) use of anticoagulants: Secondary | ICD-10-CM | POA: Diagnosis not present

## 2015-07-02 DIAGNOSIS — I480 Paroxysmal atrial fibrillation: Secondary | ICD-10-CM | POA: Diagnosis not present

## 2015-07-02 LAB — CBC AND DIFFERENTIAL
HCT: 35 % — AB (ref 41–53)
Hemoglobin: 11.6 g/dL — AB (ref 13.5–17.5)
Platelets: 207 10*3/uL (ref 150–399)
WBC: 3.1 10^3/mL

## 2015-07-02 LAB — BASIC METABOLIC PANEL
BUN: 19 mg/dL (ref 4–21)
Creatinine: 1.7 mg/dL — AB (ref 0.6–1.3)
Glucose: 104 mg/dL
Potassium: 3.4 mmol/L (ref 3.4–5.3)
Sodium: 143 mmol/L (ref 137–147)

## 2015-07-02 LAB — TSH: TSH: 3.03 u[IU]/mL (ref 0.41–5.90)

## 2015-07-10 DIAGNOSIS — I4891 Unspecified atrial fibrillation: Secondary | ICD-10-CM | POA: Diagnosis not present

## 2015-07-14 ENCOUNTER — Encounter (HOSPITAL_COMMUNITY): Payer: Self-pay | Admitting: Emergency Medicine

## 2015-07-14 ENCOUNTER — Emergency Department (HOSPITAL_COMMUNITY)
Admission: EM | Admit: 2015-07-14 | Discharge: 2015-07-14 | Disposition: A | Discharge status: Home / Self Care | Payer: Medicare Other | Attending: Emergency Medicine | Admitting: Emergency Medicine

## 2015-07-14 ENCOUNTER — Emergency Department (HOSPITAL_COMMUNITY): Payer: Medicare Other

## 2015-07-14 DIAGNOSIS — I129 Hypertensive chronic kidney disease with stage 1 through stage 4 chronic kidney disease, or unspecified chronic kidney disease: Secondary | ICD-10-CM | POA: Diagnosis not present

## 2015-07-14 DIAGNOSIS — R4182 Altered mental status, unspecified: Secondary | ICD-10-CM | POA: Diagnosis not present

## 2015-07-14 DIAGNOSIS — N189 Chronic kidney disease, unspecified: Secondary | ICD-10-CM | POA: Insufficient documentation

## 2015-07-14 DIAGNOSIS — R079 Chest pain, unspecified: Secondary | ICD-10-CM | POA: Diagnosis not present

## 2015-07-14 DIAGNOSIS — R410 Disorientation, unspecified: Secondary | ICD-10-CM | POA: Diagnosis not present

## 2015-07-14 DIAGNOSIS — R42 Dizziness and giddiness: Secondary | ICD-10-CM | POA: Diagnosis not present

## 2015-07-14 DIAGNOSIS — R531 Weakness: Secondary | ICD-10-CM | POA: Insufficient documentation

## 2015-07-14 DIAGNOSIS — R05 Cough: Secondary | ICD-10-CM | POA: Diagnosis not present

## 2015-07-14 LAB — I-STAT TROPONIN, ED: Troponin i, poc: 0.03 ng/mL (ref 0.00–0.08)

## 2015-07-14 LAB — CBC
HCT: 36.2 % — ABNORMAL LOW (ref 39.0–52.0)
Hemoglobin: 11.9 g/dL — ABNORMAL LOW (ref 13.0–17.0)
MCH: 31.8 pg (ref 26.0–34.0)
MCHC: 32.9 g/dL (ref 30.0–36.0)
MCV: 96.8 fL (ref 78.0–100.0)
Platelets: 227 10*3/uL (ref 150–400)
RBC: 3.74 MIL/uL — ABNORMAL LOW (ref 4.22–5.81)
RDW: 13.8 % (ref 11.5–15.5)
WBC: 3.5 10*3/uL — ABNORMAL LOW (ref 4.0–10.5)

## 2015-07-14 LAB — COMPREHENSIVE METABOLIC PANEL
ALT: 13 U/L — ABNORMAL LOW (ref 17–63)
AST: 24 U/L (ref 15–41)
Albumin: 3.6 g/dL (ref 3.5–5.0)
Alkaline Phosphatase: 64 U/L (ref 38–126)
Anion gap: 9 (ref 5–15)
BUN: 16 mg/dL (ref 6–20)
CO2: 22 mmol/L (ref 22–32)
Calcium: 8.3 mg/dL — ABNORMAL LOW (ref 8.9–10.3)
Chloride: 111 mmol/L (ref 101–111)
Creatinine, Ser: 1.7 mg/dL — ABNORMAL HIGH (ref 0.61–1.24)
GFR calc Af Amer: 39 mL/min — ABNORMAL LOW (ref >60)
GFR calc non Af Amer: 34 mL/min — ABNORMAL LOW (ref >60)
Glucose, Bld: 132 mg/dL — ABNORMAL HIGH (ref 65–99)
Potassium: 3.5 mmol/L (ref 3.5–5.1)
Sodium: 142 mmol/L (ref 135–145)
Total Bilirubin: 0.6 mg/dL (ref 0.3–1.2)
Total Protein: 6.3 g/dL — ABNORMAL LOW (ref 6.5–8.1)

## 2015-07-14 LAB — CBG MONITORING, ED: Glucose-Capillary: 128 mg/dL — ABNORMAL HIGH (ref 65–99)

## 2015-07-14 NOTE — ED Notes (Addendum)
Informed EDP of patient status, requested CT scan, EDP stated he would have to evaluate him first.    Update: on re-eval of results EDP requested head CT, placed verbal order at this time.

## 2015-07-14 NOTE — ED Notes (Signed)
No answer in WR

## 2015-07-14 NOTE — ED Notes (Signed)
Patient states he drove himself here and that he lives alone. Patient unable to answer some questions out of confusion. Confused to time, place, and situation. Told me he was feeling weak and dizzy, that he had some chest pain earlier today that his doctor sent him here for.

## 2015-07-16 DIAGNOSIS — I480 Paroxysmal atrial fibrillation: Secondary | ICD-10-CM | POA: Diagnosis not present

## 2015-07-21 DIAGNOSIS — N183 Chronic kidney disease, stage 3 (moderate): Secondary | ICD-10-CM | POA: Diagnosis not present

## 2015-07-21 DIAGNOSIS — I10 Essential (primary) hypertension: Secondary | ICD-10-CM | POA: Diagnosis not present

## 2015-07-23 DIAGNOSIS — I10 Essential (primary) hypertension: Secondary | ICD-10-CM | POA: Diagnosis not present

## 2015-07-23 DIAGNOSIS — N183 Chronic kidney disease, stage 3 (moderate): Secondary | ICD-10-CM | POA: Diagnosis not present

## 2015-09-10 DIAGNOSIS — N183 Chronic kidney disease, stage 3 (moderate): Secondary | ICD-10-CM | POA: Diagnosis not present

## 2015-09-10 DIAGNOSIS — I10 Essential (primary) hypertension: Secondary | ICD-10-CM | POA: Diagnosis not present

## 2015-09-29 DIAGNOSIS — Z7901 Long term (current) use of anticoagulants: Secondary | ICD-10-CM | POA: Diagnosis not present

## 2015-09-29 DIAGNOSIS — I480 Paroxysmal atrial fibrillation: Secondary | ICD-10-CM | POA: Diagnosis not present

## 2015-09-29 DIAGNOSIS — N183 Chronic kidney disease, stage 3 (moderate): Secondary | ICD-10-CM | POA: Diagnosis not present

## 2015-09-29 DIAGNOSIS — I119 Hypertensive heart disease without heart failure: Secondary | ICD-10-CM | POA: Diagnosis not present

## 2015-10-06 DIAGNOSIS — N183 Chronic kidney disease, stage 3 (moderate): Secondary | ICD-10-CM | POA: Diagnosis not present

## 2015-10-06 DIAGNOSIS — I10 Essential (primary) hypertension: Secondary | ICD-10-CM | POA: Diagnosis not present

## 2015-10-27 DIAGNOSIS — I480 Paroxysmal atrial fibrillation: Secondary | ICD-10-CM | POA: Diagnosis not present

## 2015-10-27 DIAGNOSIS — I119 Hypertensive heart disease without heart failure: Secondary | ICD-10-CM | POA: Diagnosis not present

## 2015-10-27 DIAGNOSIS — N183 Chronic kidney disease, stage 3 (moderate): Secondary | ICD-10-CM | POA: Diagnosis not present

## 2015-10-27 DIAGNOSIS — Z7901 Long term (current) use of anticoagulants: Secondary | ICD-10-CM | POA: Diagnosis not present

## 2015-11-03 DIAGNOSIS — I10 Essential (primary) hypertension: Secondary | ICD-10-CM | POA: Diagnosis not present

## 2015-11-03 DIAGNOSIS — N183 Chronic kidney disease, stage 3 (moderate): Secondary | ICD-10-CM | POA: Diagnosis not present

## 2015-11-10 DIAGNOSIS — I119 Hypertensive heart disease without heart failure: Secondary | ICD-10-CM | POA: Diagnosis not present

## 2015-12-08 DIAGNOSIS — I119 Hypertensive heart disease without heart failure: Secondary | ICD-10-CM | POA: Diagnosis not present

## 2015-12-08 DIAGNOSIS — I480 Paroxysmal atrial fibrillation: Secondary | ICD-10-CM | POA: Diagnosis not present

## 2015-12-14 DIAGNOSIS — N183 Chronic kidney disease, stage 3 (moderate): Secondary | ICD-10-CM | POA: Diagnosis not present

## 2015-12-14 DIAGNOSIS — I10 Essential (primary) hypertension: Secondary | ICD-10-CM | POA: Diagnosis not present

## 2016-01-05 DIAGNOSIS — I10 Essential (primary) hypertension: Secondary | ICD-10-CM | POA: Diagnosis not present

## 2016-01-05 DIAGNOSIS — N183 Chronic kidney disease, stage 3 (moderate): Secondary | ICD-10-CM | POA: Diagnosis not present

## 2016-01-06 DIAGNOSIS — I480 Paroxysmal atrial fibrillation: Secondary | ICD-10-CM | POA: Diagnosis not present

## 2016-01-06 DIAGNOSIS — I119 Hypertensive heart disease without heart failure: Secondary | ICD-10-CM | POA: Diagnosis not present

## 2016-01-11 ENCOUNTER — Emergency Department (HOSPITAL_BASED_OUTPATIENT_CLINIC_OR_DEPARTMENT_OTHER): Payer: Medicare Other

## 2016-01-11 ENCOUNTER — Emergency Department (HOSPITAL_BASED_OUTPATIENT_CLINIC_OR_DEPARTMENT_OTHER)
Admission: EM | Admit: 2016-01-11 | Discharge: 2016-01-11 | Disposition: A | Discharge status: Home / Self Care | Payer: Medicare Other | Attending: Emergency Medicine | Admitting: Emergency Medicine

## 2016-01-11 ENCOUNTER — Encounter (HOSPITAL_BASED_OUTPATIENT_CLINIC_OR_DEPARTMENT_OTHER): Payer: Self-pay

## 2016-01-11 DIAGNOSIS — Z87891 Personal history of nicotine dependence: Secondary | ICD-10-CM | POA: Insufficient documentation

## 2016-01-11 DIAGNOSIS — Z79899 Other long term (current) drug therapy: Secondary | ICD-10-CM | POA: Diagnosis not present

## 2016-01-11 DIAGNOSIS — I129 Hypertensive chronic kidney disease with stage 1 through stage 4 chronic kidney disease, or unspecified chronic kidney disease: Secondary | ICD-10-CM | POA: Diagnosis not present

## 2016-01-11 DIAGNOSIS — N183 Chronic kidney disease, stage 3 (moderate): Secondary | ICD-10-CM | POA: Diagnosis not present

## 2016-01-11 DIAGNOSIS — R296 Repeated falls: Secondary | ICD-10-CM | POA: Diagnosis not present

## 2016-01-11 DIAGNOSIS — R109 Unspecified abdominal pain: Secondary | ICD-10-CM

## 2016-01-11 DIAGNOSIS — R4182 Altered mental status, unspecified: Secondary | ICD-10-CM | POA: Diagnosis not present

## 2016-01-11 DIAGNOSIS — I2510 Atherosclerotic heart disease of native coronary artery without angina pectoris: Secondary | ICD-10-CM | POA: Insufficient documentation

## 2016-01-11 DIAGNOSIS — R410 Disorientation, unspecified: Secondary | ICD-10-CM | POA: Diagnosis not present

## 2016-01-11 DIAGNOSIS — W19XXXA Unspecified fall, initial encounter: Secondary | ICD-10-CM

## 2016-01-11 HISTORY — DX: Cardiac arrhythmia, unspecified: I49.9

## 2016-01-11 HISTORY — DX: Personal history of other diseases of the circulatory system: Z86.79

## 2016-01-11 LAB — COMPREHENSIVE METABOLIC PANEL
ALT: 10 U/L — ABNORMAL LOW (ref 17–63)
AST: 20 U/L (ref 15–41)
Albumin: 4 g/dL (ref 3.5–5.0)
Alkaline Phosphatase: 51 U/L (ref 38–126)
Anion gap: 8 (ref 5–15)
BUN: 26 mg/dL — ABNORMAL HIGH (ref 6–20)
CO2: 21 mmol/L — ABNORMAL LOW (ref 22–32)
Calcium: 9 mg/dL (ref 8.9–10.3)
Chloride: 111 mmol/L (ref 101–111)
Creatinine, Ser: 2.49 mg/dL — ABNORMAL HIGH (ref 0.61–1.24)
GFR calc Af Amer: 25 mL/min — ABNORMAL LOW (ref >60)
GFR calc non Af Amer: 21 mL/min — ABNORMAL LOW (ref >60)
Glucose, Bld: 89 mg/dL (ref 65–99)
Potassium: 3.9 mmol/L (ref 3.5–5.1)
Sodium: 140 mmol/L (ref 135–145)
Total Bilirubin: 0.6 mg/dL (ref 0.3–1.2)
Total Protein: 6.8 g/dL (ref 6.5–8.1)

## 2016-01-11 LAB — URINE MICROSCOPIC-ADD ON

## 2016-01-11 LAB — URINALYSIS, ROUTINE W REFLEX MICROSCOPIC
Bilirubin Urine: NEGATIVE
Glucose, UA: NEGATIVE mg/dL
Hgb urine dipstick: NEGATIVE
Ketones, ur: NEGATIVE mg/dL
Leukocytes, UA: NEGATIVE
Nitrite: NEGATIVE
Protein, ur: 30 mg/dL — AB
Specific Gravity, Urine: 1.014 (ref 1.005–1.030)
pH: 5.5 (ref 5.0–8.0)

## 2016-01-11 LAB — CBC WITH DIFFERENTIAL/PLATELET
Basophils Absolute: 0 10*3/uL (ref 0.0–0.1)
Basophils Relative: 0 %
Eosinophils Absolute: 0 10*3/uL (ref 0.0–0.7)
Eosinophils Relative: 1 %
HCT: 32.3 % — ABNORMAL LOW (ref 39.0–52.0)
Hemoglobin: 10.7 g/dL — ABNORMAL LOW (ref 13.0–17.0)
Lymphocytes Relative: 34 %
Lymphs Abs: 1 10*3/uL (ref 0.7–4.0)
MCH: 31.8 pg (ref 26.0–34.0)
MCHC: 33.1 g/dL (ref 30.0–36.0)
MCV: 95.8 fL (ref 78.0–100.0)
Monocytes Absolute: 0.2 10*3/uL (ref 0.1–1.0)
Monocytes Relative: 8 %
Neutro Abs: 1.6 10*3/uL — ABNORMAL LOW (ref 1.7–7.7)
Neutrophils Relative %: 57 %
Platelets: 164 10*3/uL (ref 150–400)
RBC: 3.37 MIL/uL — ABNORMAL LOW (ref 4.22–5.81)
RDW: 13.6 % (ref 11.5–15.5)
WBC: 2.8 10*3/uL — ABNORMAL LOW (ref 4.0–10.5)

## 2016-01-11 LAB — RAPID URINE DRUG SCREEN, HOSP PERFORMED
Amphetamines: NOT DETECTED
Barbiturates: NOT DETECTED
Benzodiazepines: NOT DETECTED
Cocaine: NOT DETECTED
Opiates: NOT DETECTED
Tetrahydrocannabinol: NOT DETECTED

## 2016-01-11 LAB — LIPASE, BLOOD: Lipase: 36 U/L (ref 11–51)

## 2016-01-11 LAB — AMMONIA: Ammonia: 13 umol/L (ref 9–35)

## 2016-01-11 LAB — ETHANOL: Alcohol, Ethyl (B): <5 mg/dL (ref <5)

## 2016-01-11 MED ORDER — SODIUM CHLORIDE 0.9 % IV SOLN
1000.0000 mL | Freq: Once | INTRAVENOUS | Status: AC
  Administered 2016-01-11: 1000 mL via INTRAVENOUS

## 2016-01-11 MED ORDER — SODIUM CHLORIDE 0.9 % IV SOLN
1000.0000 mL | INTRAVENOUS | Status: DC
  Administered 2016-01-11: 1000 mL via INTRAVENOUS

## 2016-01-11 NOTE — Care Management Note (Signed)
Case Management Note  Patient Details  Name: Lorik Boyea MRN: [id number] Date of Birth: Jun 17, 1923  Subjective/Objective:  Patient presents to ED [facility] with increased weakness, falls at home, confusion                 Action/Plan: Patient lives at home alone.  EDCM spoke to patient's daughter Claudine Mouton who reports she will be staying with patient at home, she took some time off work.  EDCM offered choice of home health agencies.  [facility] was chosen. EDCM informed Avis that [place] will have 24-48 hours to contact her.  EDCM explained that SW will be added to assist with placement if needed.  EDCM discussed [facility], Avis agreed.  [facility] discussed Choice connections to assist with placement if needed.  Avis agreed.  [facility] discussed private duty nursing agencies and explained it will be an out of pocket expense.  Avis reports patient does not have a neurologist.  Has upcoming appointment with pcp.  [facility] faxed: contact information for [facility] for rolator and HH, as Avis has used this agency for equipment in the past, list of private duty nursing agencies, contact information for choice connections and [facility] to [phone] with confirmation of receipt.  Avis is aware to pick up rolator at [facility] store.  Discussed patient with EDP.  No further EDCM needs at this time.  [facility] consult placed   Expected Discharge Date:                  Expected Discharge Plan:  [place]  In-House Referral:     Discharge planning Services  CM Consult  Post Acute Care Choice:  Home Health, Durable Medical Equipment Choice offered to:  Patient, patient's daughter  DME Arranged:  [place] rolling with seat Copy) DME Agency:  [place]:  RN, OT, PT, Nurse's Aide, Social Work [employer] Agency:  [place]  Status of Service:  Completed, signed off  If discussed at [employer], dates discussed:    Additional CommentsLivia Snellen, RN 01/11/2016, 6:29 PM

## 2016-01-11 NOTE — ED Provider Notes (Signed)
[facility] DEPT [facility] Provider Note   CSN: [id number] Arrival date & time: 01/11/16  1429    By signing my name below, I, Dolores Hoose, attest that this documentation has been prepared under the direction and in the presence of Jola Schmidt, MD . Electronically Signed: Dolores Hoose, Scribe. 01/11/2016. 3:35 PM.   History   Chief Complaint Chief Complaint  Patient presents with  . Fall   The history is provided by the patient and a relative. No language interpreter was used.    LEVEL 5 CAVEAT DUE TO ALTERED MENTAL STATUS   HPI Comments:  Wayne Fuller is a 82 y.o. male with PMHx of femur fracture who presents with his daughter to the Emergency Department complaining of worsening constant left-sided abdomen pain s/p several falls over the past 8 months. His daughter reports he has been getting weaker, falling more and more confused over the past 8 months. She denies he is taking any new medications. No modifying factors indicated. She states the Pt is in transition between a PCP and a Senior Care specialist, but has been compliant with seeing his Cardiologist.    Past Medical History:  Diagnosis Date  . Chronic kidney disease    ckd stage 3  . History of angina   . Hypertension   . Irregular heart rhythm     Patient Active Problem List   Diagnosis Date Noted  . Elevated troponin 04/02/2015  . Atrial fibrillation ([place]) 04/02/2015  . CKD (chronic kidney disease) stage 3, GFR 30-59 ml/min 08/11/2014  . GERD (gastroesophageal reflux disease) 08/11/2014  . Chest pain at rest 08/11/2014  . Chest pain 04/03/2014  . LEUKOPENIA, MILD 04/29/2009  . Essential hypertension 04/29/2009  . CAD (coronary artery disease) 04/29/2009  . CHEST PAIN, PLEURITIC 04/29/2009  . TOBACCO ABUSE, HX OF 04/29/2009    Past Surgical History:  Procedure Laterality Date  . BACK SURGERY    . CERVICAL SPINE SURGERY         Home Medications    Prior to Admission medications   Medication Sig  Start Date End Date Taking? Authorizing Provider  amiodarone (PACERONE) 200 MG tablet Take 1 tablet (200 mg total) by mouth 2 (two) times daily. 04/03/15   Neldon Labella, NP  Cholecalciferol (VITAMIN D3) 5000 UNITS CAPS Take 5,000 Units by mouth daily.    Historical Provider, MD  diltiazem (CARDIZEM CD) 240 MG 24 hr capsule Take 240 mg by mouth daily.    Historical Provider, MD  doxazosin (CARDURA) 2 MG tablet Take 2 mg by mouth daily. 02/04/15   Historical Provider, MD  folic acid (FOLVITE) 1 MG tablet Take 1 mg by mouth daily.    Historical Provider, MD  hydrALAZINE (APRESOLINE) 50 MG tablet Take 50 mg by mouth 2 (two) times daily.    Historical Provider, MD  metoprolol succinate (TOPROL-XL) 50 MG 24 hr tablet Take 50 mg by mouth daily. 02/18/15   Historical Provider, MD  pantoprazole (PROTONIX) 40 MG tablet Take 40 mg by mouth daily.    Historical Provider, MD  Tamsulosin HCl (FLOMAX) 0.4 MG CAPS Take 0.4 mg by mouth daily after supper.     Historical Provider, MD    Family History No family history on file.  Social History Social History  Substance Use Topics  . Smoking status: Former Research scientist (life sciences)  . Smokeless tobacco: Never Used     Comment: quit smoking  around 1996  . Alcohol use No     Allergies   Review of  patient's allergies indicates no known allergies.   Review of Systems Review of Systems  Unable to perform ROS: Mental status change     Physical Exam Updated Vital Signs BP (!) 204/100 (BP Location: Right Arm)   Pulse 61   Temp 98.2 F (36.8 C) (Oral)   Resp 20   Ht 5\' 11"  (1.803 m)   Wt 150 lb (68 kg)   SpO2 100%   BMI 20.92 kg/m   Physical Exam  Constitutional: He appears well-developed and well-nourished. No distress.  HENT:  Head: Normocephalic.  Eyes: EOM are normal.  Neck: Normal range of motion. Neck supple.  Cardiovascular: Normal rate and regular rhythm.   Pulmonary/Chest: Effort normal and breath sounds normal.  Abdominal: Soft. He exhibits no  distension.  Mild  left-sided abdominal tenderness without rash  Musculoskeletal: Normal range of motion.  Neurological: He is alert.  Skin: Skin is warm. No rash noted.  Psychiatric: He has a normal mood and affect.  Nursing note and vitals reviewed.    ED Treatments / Results  DIAGNOSTIC STUDIES:  Oxygen Saturation is 100% on RA, normal by my interpretation.    COORDINATION OF CARE:  3:37 PM Discussed treatment plan with pt at bedside which included blood work, urinalysis, CTA, CT Head, and CT chest and pt agreed to plan.  Labs (all labs ordered are listed, but only abnormal results are displayed) Labs Reviewed - No data to display  EKG  EKG Interpretation None       Radiology No results found.  Procedures Procedures (including critical care time)  Medications Ordered in ED Medications - No data to display   Initial Impression / Assessment and Plan / ED Course  I have reviewed the triage vital signs and the nursing notes.  Pertinent labs & imaging results that were available during my care of the patient were reviewed by me and considered in my medical decision making (see chart for details).  Clinical Course    Overall the patient is well-appearing.  His had multiple falls over the past several months.  He's had some increasing confusion over the past 5-6 months.  Large workup completed here in emergency department without acute pathology that requires emergent admission.  He does have mild worsening renal insufficiency and thus he'll need follow-up with his primary care physician.  I spoke with case management who will help set up home health RN, PT, age, social worker.  He likely will need placement at some point.  His family is providing care at this time.  Final Clinical Impressions(s) / ED Diagnoses   Final diagnoses:  Fall, initial encounter  Confusion    New Prescriptions New Prescriptions   No medications on file    I personally performed the  services described in this documentation, which was scribed in my presence. The recorded information has been reviewed and is accurate.        Jola Schmidt, MD 01/11/16 [phone]

## 2016-01-11 NOTE — ED Notes (Signed)
MD made aware of pts BP 

## 2016-01-11 NOTE — ED Triage Notes (Signed)
Per daughter pt with recent fall, off balance, lower back pain x 2-3 months-presents to triage in w/c-NAD

## 2016-01-11 NOTE — ED Notes (Signed)
Daughter reports multiple falls-states that patient is taking eloquis but last dose was 1 week ago because he has since ran out.  Daughter is concerned about possible UTI.   Patient denies.

## 2016-01-12 ENCOUNTER — Other Ambulatory Visit: Payer: Self-pay | Admitting: *Deleted

## 2016-01-12 NOTE — Patient Outreach (Signed)
[facility]) Care Management  01/12/2016  Allante Mousel [birth year] [id number]   RN spoke with pt's son and introduced [facility] and available referral and services. Son Fritz Pickerel) indicates pt in need of services resources in the community but was very admant about seeking a new PCP and pt will no longer be services under the current primary care provider. Pt has an appointment already scheduled with the new provider. Son verify other sibling involved with this pt [facility]) but she is not currently available at this time. Caregiver had several inquires concerning available resources in the community. Discussed adult daycare services (PACE), Aide services, mobile meals and other forms of available transportation. Caregiver informed that [facility] not responsible for these programs and available agency as they are a separate entity and/or program for the pt. Caregiver aware each agency has their own criteria and qualification in order to receive available services. Caregiver very interested in available programs and services. Caregiver son took RN case manager's contact number for his sister Avis who will contact RN for additional information for this pt.  Will await call back from pt's daughter (primary caregiver).   Raina Mina, RN Care Management Coordinator [place] Office [phone]

## 2016-01-13 ENCOUNTER — Other Ambulatory Visit: Payer: Self-pay | Admitting: *Deleted

## 2016-01-13 NOTE — Patient Outreach (Signed)
[facility]) Care Management  01/13/2016  Koston Maione [birth year] [id number]  RN second attempt to reach pt's daughter Claudine Mouton however only able to leave a HIPPA approved voice message requesting a call back. Will continue outreach attempts to reach primary caregiver for this pt on possible needs and resources.  Raina Mina, RN Care Management Coordinator [place] Office [phone]

## 2016-01-13 NOTE — Patient Outreach (Signed)
[facility]) Care Management  01/12/2016  Evart Ki 06/03/1923 [id number]  RN received two calls from pt's daughter inquiring on the information that has been discussed with the other sibling (her brother).  Note RN explained to the pt's son earlier today that RN would not be available due to a sheduled conference. Will return the call tomorrow and inquire further on caregivers request for available resources.  Raina Mina, RN Care Management Coordinator [place] Office [phone]

## 2016-01-14 ENCOUNTER — Encounter: Payer: Self-pay | Admitting: *Deleted

## 2016-01-14 ENCOUNTER — Other Ambulatory Visit: Payer: Self-pay | Admitting: *Deleted

## 2016-01-14 DIAGNOSIS — I10 Essential (primary) hypertension: Secondary | ICD-10-CM

## 2016-01-14 DIAGNOSIS — R2689 Other abnormalities of gait and mobility: Secondary | ICD-10-CM | POA: Diagnosis not present

## 2016-01-14 NOTE — Patient Outreach (Signed)
[facility]) Care Management  01/14/2016  Wayne Fuller [birth year] [id number]   RN spoke in detail with pt's daughter Wayne Fuller who is the primary caregiver for this pt. RN introduced the [facility] program and available services. Caregiver is seeking community resources to assist with her father due to his early stages of Dementia. States currently pt is receiving his daily assistance from family however states ppt needs more activities and services to accommodate his ongoing care. Much discussed on the adult daycare services, possible eligibility for MCD reports income at approximately $1000 could not be exact, aide services such as Nature conservation officer or Scientist, water quality for [employer] if eligible.  Other area discussed due to pt's limited finances and ongoing assistance within his home was level of care issues related to possible placement (memory unit). Further discussion on pt's medical issues however managed very well with no reported issues. Caregiver states pt "stubborn" at times and mows the lawn when the temperatures are high and unable to control some of his habits but has supervisor with rotating family members. Encouraged caregiver to obtain Senior Living booklet and obtain additional placement facilities when to social worker contacts her with community resources if she decides this is an option. Inquired if there are any other issues that needs to be addressed at this time related to pt's medical issues and/or medications. States no medical problems however mentioned 3 medication pt did not have that her family will be picking up from the pharmacy today but not able to verify which medications. RN attempted to complete the initial assessment however unsuccessful as daughter did not have all the information but was able to supply some. Daughter aware that this case will be assigned to [facility] social worker for the above needed resources and RN will follow up next week to inquired  on progress. Daughter willing to accept the needed referral and resources and will seek the needed services her father needs at this time. This plan of care was discussed with goals. Daughter very appreciative and grateful for the resources/referral and information provided today. Note pt will be changing primary provider to [facility] with the pending initial office visit on 8/29. No other inquires or request at this time.

## 2016-01-16 DIAGNOSIS — I129 Hypertensive chronic kidney disease with stage 1 through stage 4 chronic kidney disease, or unspecified chronic kidney disease: Secondary | ICD-10-CM | POA: Diagnosis not present

## 2016-01-16 DIAGNOSIS — Z87891 Personal history of nicotine dependence: Secondary | ICD-10-CM | POA: Diagnosis not present

## 2016-01-16 DIAGNOSIS — K219 Gastro-esophageal reflux disease without esophagitis: Secondary | ICD-10-CM | POA: Diagnosis not present

## 2016-01-16 DIAGNOSIS — M549 Dorsalgia, unspecified: Secondary | ICD-10-CM | POA: Diagnosis not present

## 2016-01-16 DIAGNOSIS — N183 Chronic kidney disease, stage 3 (moderate): Secondary | ICD-10-CM | POA: Diagnosis not present

## 2016-01-16 DIAGNOSIS — Z7901 Long term (current) use of anticoagulants: Secondary | ICD-10-CM | POA: Diagnosis not present

## 2016-01-16 DIAGNOSIS — I2510 Atherosclerotic heart disease of native coronary artery without angina pectoris: Secondary | ICD-10-CM | POA: Diagnosis not present

## 2016-01-16 DIAGNOSIS — I4891 Unspecified atrial fibrillation: Secondary | ICD-10-CM | POA: Diagnosis not present

## 2016-01-16 DIAGNOSIS — R296 Repeated falls: Secondary | ICD-10-CM | POA: Diagnosis not present

## 2016-01-16 DIAGNOSIS — Z9181 History of falling: Secondary | ICD-10-CM | POA: Diagnosis not present

## 2016-01-17 DIAGNOSIS — R296 Repeated falls: Secondary | ICD-10-CM | POA: Diagnosis not present

## 2016-01-17 DIAGNOSIS — M549 Dorsalgia, unspecified: Secondary | ICD-10-CM | POA: Diagnosis not present

## 2016-01-17 DIAGNOSIS — Z9181 History of falling: Secondary | ICD-10-CM | POA: Diagnosis not present

## 2016-01-17 DIAGNOSIS — I129 Hypertensive chronic kidney disease with stage 1 through stage 4 chronic kidney disease, or unspecified chronic kidney disease: Secondary | ICD-10-CM | POA: Diagnosis not present

## 2016-01-17 DIAGNOSIS — I2510 Atherosclerotic heart disease of native coronary artery without angina pectoris: Secondary | ICD-10-CM | POA: Diagnosis not present

## 2016-01-17 DIAGNOSIS — I4891 Unspecified atrial fibrillation: Secondary | ICD-10-CM | POA: Diagnosis not present

## 2016-01-17 DIAGNOSIS — N183 Chronic kidney disease, stage 3 (moderate): Secondary | ICD-10-CM | POA: Diagnosis not present

## 2016-01-17 DIAGNOSIS — Z87891 Personal history of nicotine dependence: Secondary | ICD-10-CM | POA: Diagnosis not present

## 2016-01-17 DIAGNOSIS — K219 Gastro-esophageal reflux disease without esophagitis: Secondary | ICD-10-CM | POA: Diagnosis not present

## 2016-01-17 DIAGNOSIS — Z7901 Long term (current) use of anticoagulants: Secondary | ICD-10-CM | POA: Diagnosis not present

## 2016-01-18 DIAGNOSIS — I4891 Unspecified atrial fibrillation: Secondary | ICD-10-CM | POA: Diagnosis not present

## 2016-01-18 DIAGNOSIS — Z87891 Personal history of nicotine dependence: Secondary | ICD-10-CM | POA: Diagnosis not present

## 2016-01-18 DIAGNOSIS — I2510 Atherosclerotic heart disease of native coronary artery without angina pectoris: Secondary | ICD-10-CM | POA: Diagnosis not present

## 2016-01-18 DIAGNOSIS — Z7901 Long term (current) use of anticoagulants: Secondary | ICD-10-CM | POA: Diagnosis not present

## 2016-01-18 DIAGNOSIS — Z9181 History of falling: Secondary | ICD-10-CM | POA: Diagnosis not present

## 2016-01-18 DIAGNOSIS — K219 Gastro-esophageal reflux disease without esophagitis: Secondary | ICD-10-CM | POA: Diagnosis not present

## 2016-01-18 DIAGNOSIS — M549 Dorsalgia, unspecified: Secondary | ICD-10-CM | POA: Diagnosis not present

## 2016-01-18 DIAGNOSIS — I129 Hypertensive chronic kidney disease with stage 1 through stage 4 chronic kidney disease, or unspecified chronic kidney disease: Secondary | ICD-10-CM | POA: Diagnosis not present

## 2016-01-18 DIAGNOSIS — R296 Repeated falls: Secondary | ICD-10-CM | POA: Diagnosis not present

## 2016-01-18 DIAGNOSIS — N183 Chronic kidney disease, stage 3 (moderate): Secondary | ICD-10-CM | POA: Diagnosis not present

## 2016-01-19 ENCOUNTER — Ambulatory Visit (INDEPENDENT_AMBULATORY_CARE_PROVIDER_SITE_OTHER): Payer: Medicare Other | Admitting: Nurse Practitioner

## 2016-01-19 ENCOUNTER — Ambulatory Visit: Payer: Self-pay | Admitting: Nurse Practitioner

## 2016-01-19 ENCOUNTER — Encounter: Payer: Self-pay | Admitting: Nurse Practitioner

## 2016-01-19 VITALS — BP 144/62 | HR 52 | Temp 97.8°F | Ht 70.0 in | Wt 156.6 lb

## 2016-01-19 DIAGNOSIS — K219 Gastro-esophageal reflux disease without esophagitis: Secondary | ICD-10-CM | POA: Diagnosis not present

## 2016-01-19 DIAGNOSIS — Z7901 Long term (current) use of anticoagulants: Secondary | ICD-10-CM | POA: Diagnosis not present

## 2016-01-19 DIAGNOSIS — I480 Paroxysmal atrial fibrillation: Secondary | ICD-10-CM | POA: Diagnosis not present

## 2016-01-19 DIAGNOSIS — R296 Repeated falls: Secondary | ICD-10-CM | POA: Diagnosis not present

## 2016-01-19 DIAGNOSIS — Z87891 Personal history of nicotine dependence: Secondary | ICD-10-CM | POA: Diagnosis not present

## 2016-01-19 DIAGNOSIS — N183 Chronic kidney disease, stage 3 unspecified: Secondary | ICD-10-CM

## 2016-01-19 DIAGNOSIS — N400 Enlarged prostate without lower urinary tract symptoms: Secondary | ICD-10-CM

## 2016-01-19 DIAGNOSIS — I4891 Unspecified atrial fibrillation: Secondary | ICD-10-CM | POA: Diagnosis not present

## 2016-01-19 DIAGNOSIS — M549 Dorsalgia, unspecified: Secondary | ICD-10-CM | POA: Diagnosis not present

## 2016-01-19 DIAGNOSIS — F0391 Unspecified dementia with behavioral disturbance: Secondary | ICD-10-CM

## 2016-01-19 DIAGNOSIS — Z23 Encounter for immunization: Secondary | ICD-10-CM

## 2016-01-19 DIAGNOSIS — I2510 Atherosclerotic heart disease of native coronary artery without angina pectoris: Secondary | ICD-10-CM | POA: Diagnosis not present

## 2016-01-19 DIAGNOSIS — I10 Essential (primary) hypertension: Secondary | ICD-10-CM | POA: Diagnosis not present

## 2016-01-19 DIAGNOSIS — Z9181 History of falling: Secondary | ICD-10-CM | POA: Diagnosis not present

## 2016-01-19 DIAGNOSIS — F03918 Unspecified dementia, unspecified severity, with other behavioral disturbance: Secondary | ICD-10-CM

## 2016-01-19 DIAGNOSIS — I129 Hypertensive chronic kidney disease with stage 1 through stage 4 chronic kidney disease, or unspecified chronic kidney disease: Secondary | ICD-10-CM | POA: Diagnosis not present

## 2016-01-19 MED ORDER — TAMSULOSIN HCL 0.4 MG PO CAPS
0.4000 mg | ORAL_CAPSULE | Freq: Every day | ORAL | 3 refills | Status: DC
Start: 2016-01-19 — End: 2016-02-29

## 2016-01-19 MED ORDER — VITAMIN D3 125 MCG (5000 UT) PO CAPS: 5000.0000 [IU] | ORAL_CAPSULE | Freq: Every day | ORAL | 3 refills | Status: DC

## 2016-01-19 NOTE — Progress Notes (Signed)
Patient failed clock drawing test given by Rafael Bihari, CMA

## 2016-01-19 NOTE — Progress Notes (Signed)
Careteam: Patient Care Team: Benito Mccreedy, MD as PCP - General (Internal Medicine) Tobi Bastos, RN as [place], LCSW as [facility] Neldon Labella, NP as Nurse Practitioner (Cardiology)  Advanced Directive information Does patient have an advance directive?: Yes  No Known Allergies  Chief Complaint  Patient presents with  . Medical Management of Chronic Issues    NP to establish. Avis, daughter and Paulo Fruit, son and Fritz Pickerel, son in room with patient.     HPI: Patient is a 82 y.o. male seen in the office today to establish care. Pt previously following with Dr Einar Gip cardiologist who recommended he get a PCP Needing refills on medications today. Out of flomax and vit d Hx of chronic back problems- daughter reports herniated disc on lumbar and cervical spine and has had 2 surgeries due to this.   Pt current lives alone, family does medication  Pt with memory loss. Daughter reports in January they started noticing memory loss and it has gradually gotten worse since then.  Been having episodes where he is looking for his wife who had passed away last year, also looking for other deceased relatives. Lately he has been okay but has had episode where they can not get him to go to the doctors.  Lives alone, getting help with resources. More help needed because it is dangerous for him to be alone. Daughter has taken time off work so they can get things taken care of. SW visited with them yesterday.  No longer driving.   Looking into adult day care or long term care due to progressive decline of memory. Pt was sent to ED after fall and was found to be dehydrated.  Dementia work up was done and resources given to daughter. She is working with SW .  Review of Systems:  Review of Systems  Constitutional: Positive for unexpected weight change (weight loss). Negative for activity change, appetite change  (eating better) and fatigue.  HENT: Negative for congestion and hearing loss.   Eyes: Negative.   Respiratory: Negative for cough and shortness of breath.   Cardiovascular: Positive for palpitations (hx of palpitations ). Negative for chest pain and leg swelling.  Gastrointestinal: Negative for abdominal pain, constipation and diarrhea.  Genitourinary: Negative for difficulty urinating and dysuria.  Musculoskeletal: Positive for back pain. Negative for arthralgias and myalgias.  Skin: Negative for color change and wound.  Neurological: Positive for dizziness and weakness.  Psychiatric/Behavioral: Positive for confusion. Negative for agitation and behavioral problems.    Past Medical History:  Diagnosis Date  . BPH (benign prostatic hyperplasia)   . Chronic kidney disease    ckd stage 3  . Dementia   . GERD (gastroesophageal reflux disease)   . Hemorrhoids   . History of angina   . Hyperlipemia   . Hypertension   . Irregular heart rhythm   . Paroxysmal a-fib ([place]) 04/02/2015   Past Surgical History:  Procedure Laterality Date  . BACK SURGERY    . CERVICAL SPINE SURGERY    . SPINE SURGERY     Social History:   reports that he has quit smoking. His smoking use included Cigarettes. He has never used smokeless tobacco. He reports that he does not drink alcohol or use drugs.  Family History  Problem Relation Age of Onset  . Hypertension Sister   . Other Son     Homicide, stabbed  . Stroke Son   . Hypertension  Son   . Diabetes Son   . Hypertension Son     Medications: Patient's Medications  New Prescriptions   No medications on file  Previous Medications   AMIODARONE (PACERONE) 200 MG TABLET    Take 200 mg by mouth daily.   APIXABAN (ELIQUIS) 2.5 MG TABS TABLET    Take 2.5 mg by mouth 2 (two) times daily.   CHOLECALCIFEROL (VITAMIN D3) 5000 UNITS CAPS    Take 5,000 Units by mouth daily.   DILTIAZEM (CARDIZEM CD) 240 MG 24 HR CAPSULE    Take 240 mg by mouth daily. Will  obtain today from pharmacy   HYDRALAZINE (APRESOLINE) 50 MG TABLET    Take 50 mg by mouth 3 (three) times daily.    METOPROLOL SUCCINATE (TOPROL-XL) 50 MG 24 HR TABLET    Take 50 mg by mouth daily.   TAMSULOSIN HCL (FLOMAX) 0.4 MG CAPS    Take 0.4 mg by mouth daily after supper.   Modified Medications   No medications on file  Discontinued Medications   No medications on file     Physical Exam:  Vitals:   01/19/16 1253  BP: (!) 144/62  Pulse: (!) 52  Temp: 97.8 F (36.6 C)  TempSrc: Oral  Weight: 156 lb 9.6 oz (71 kg)  Height: 5\' 10"  (1.778 m)   Body mass index is 22.47 kg/m.  Physical Exam  Constitutional: He is oriented to person, place, and time. No distress.  Frail thin black male NAD  HENT:  Head: Normocephalic and atraumatic.  Mouth/Throat: Oropharynx is clear and moist. No oropharyngeal exudate.  Eyes: Conjunctivae and EOM are normal. Pupils are equal, round, and reactive to light.  Neck: Normal range of motion. Neck supple.  Cardiovascular: Normal rate, regular rhythm and normal heart sounds.   Pulmonary/Chest: Effort normal and breath sounds normal.  Abdominal: Soft. Bowel sounds are normal.  Musculoskeletal: He exhibits edema (1+ edema bilaterally). He exhibits no tenderness.  Neurological: He is alert and oriented to person, place, and time.  Skin: Skin is warm and dry. He is not diaphoretic.  Psychiatric: He has a normal mood and affect. Cognition and memory are impaired. He exhibits abnormal recent memory.    Labs reviewed: Basic Metabolic Panel:  Recent Labs  04/03/15 0625 07/02/15 07/14/15 1450 01/11/16 1600  NA 141 143 142 140  K 3.5 3.4 3.5 3.9  CL 110  --  111 111  CO2 25  --  22 21*  GLUCOSE 94  --  132* 89  BUN 9 19 16  26*  CREATININE 1.66* 1.7* 1.70* 2.49*  CALCIUM 8.4*  --  8.3* 9.0  TSH  --  3.03  --   --    Liver Function Tests:  Recent Labs  04/02/15 1440 07/14/15 1450 01/11/16 1600  AST 6* 24 20  ALT 10* 13* 10*  ALKPHOS  65 64 51  BILITOT 0.6 0.6 0.6  PROT 6.2* 6.3* 6.8  ALBUMIN 3.5 3.6 4.0    Recent Labs  01/11/16 1600  LIPASE 36    Recent Labs  01/11/16 1600  AMMONIA 13   CBC:  Recent Labs  04/02/15 1440 04/03/15 0304 07/02/15 07/14/15 1450 01/11/16 1600  WBC 3.8* 3.1* 3.1 3.5* 2.8*  NEUTROABS 2.8  --   --   --  1.6*  HGB 12.2* 10.5* 11.6* 11.9* 10.7*  HCT 37.5* 32.1* 35* 36.2* 32.3*  MCV 95.7 93.9  --  96.8 95.8  PLT 157 141* 207 227 164  Lipid Panel: No results for input(s): CHOL, HDL, LDLCALC, TRIG, CHOLHDL, LDLDIRECT in the last 8760 hours. TSH:  Recent Labs  07/02/15  TSH 3.03   A1C: No results found for: HGBA1C mini-mental status exam MMSE - Mini Mental State Exam 01/19/2016  Orientation to time 2  Orientation to Place 5  Registration 3  Attention/ Calculation 0  Recall 0  Language- name 2 objects 2  Language- repeat 0  Language- follow 3 step command 3  Language- read & follow direction 1  Write a sentence 0  Copy design 0  Total score 16     Assessment/Plan 1. Dementia with behavioral disturbance Major decline since January of this year. Daughter has taken some time off work in hopes to get resources together to meet Mr Tromp need. Pt went to the ED earlier this month due to FTT to dehydration. Now has been doing much better with increase support from family and friends.  Daughter still working on placement for pt. She is unsure if he will be moving to assisted living vs SNF vs day program. Now having a lot of family and friends come in to help him but she is aware this is a short term solation.   2. Back pain, unspecified location Stable, occasionally will complain of low back pain. May use tylenol 650 mg q 6 hours PRN for this   3. Essential hypertension Blood pressure stable. Pt following with cardiology due to htn, chest pain and a fib. conts on metoprolol, hydralazine and cardizem for htn  4. CKD (chronic kidney disease) stage 3, GFR 30-59  ml/min Worsen renal function noted from last labs when pt went to the ED and found to be dehydration. Family working with pt to help with nutritional status and medication management   5. Gastroesophageal reflux disease without esophagitis Stable, not currently needing medications  6. Paroxysmal atrial fibrillation [facility]) Following with cardiology, remains in [place]. Rate controlled on cradizem, metoprolol and amiodarone.   7. BPH (benign prostatic hyperplasia) conts on flomax   8 Debility -recently with frequent falls. Now currently working with home health services, no falls in the last few weeks  Information given on dementia. priority is keeping the patient safe. Pt at risk for wandering and family aware. Daughter has been very active in trying to find care for her father Follow up in 6 weeks on dementia and chronic conditions with Dr Mariea Clonts Will also request records from previous PCP at this time   [name]. Harle Battiest  [facility] [phone] 8 am - 5 pm) [phone] (after hours)

## 2016-01-19 NOTE — Patient Instructions (Signed)
Follow up in 3 months for physical with Janett Billow

## 2016-01-20 ENCOUNTER — Other Ambulatory Visit: Payer: Self-pay | Admitting: *Deleted

## 2016-01-20 NOTE — Patient Outreach (Signed)
[facility]) Care Management  01/20/2016  Wayne Fuller 10-Oct-1923 [id number]  CSW received a new referral on patient from patient's RNCM with [place] [facility], Imelda Pillow indicating that patient would benefit from social work services and resources to assist with referrals to various community agencies and resources.  More specifically, Ms. Zigmund Daniel reported that patient is interested in obtaining information and resources for possible long-term care placement into an assisted living facility that accommodates Dementia patients and has a memory guard system.  Patient is also interested in receiving information, and a possible referral, to adult day care programs, such as PACE (Program of [place] for the Elderly) and ACE ([place] for Enrichment).  Ms. Zigmund Daniel would like for CSW to assist patient with completion of an Adult Medicaid application through the [place], so that patient would be eligible to [employer] Company secretary), [employer] ([place]), and [facility]RP ([place]), in the event that patient denies placement or attendance at an adult day care program.  Patient currently has a caregiver providing assistance to patient in the home, as well as family members, who are all providing patient with 24 hour care and supervision.   CSW made an initial attempt to try and contact patient today to perform phone assessment, as well as assess and assist with social needs and services, without success.  A HIPAA complaint message was left for patient on voicemail.  CSW is currently awaiting a return call.  CSW will attempt to contact patient again in one week to complete the initial assessment, as well as assist with referrals to various community agencies and resources. Nat Christen, BSW, MSW, LCSW  Licensed Brewing technologist [facility]  Mailing [place] [address] [place], [place] [address] Physical Address-[address], [place], [place] [address] Toll Free Main # [phone] Fax # [phone] Cell # [phone]  Fax # [phone]  [email or web address]

## 2016-01-21 ENCOUNTER — Encounter: Payer: Self-pay | Admitting: *Deleted

## 2016-01-21 ENCOUNTER — Other Ambulatory Visit: Payer: Self-pay | Admitting: *Deleted

## 2016-01-21 ENCOUNTER — Other Ambulatory Visit: Payer: Medicare Other | Admitting: *Deleted

## 2016-01-21 DIAGNOSIS — R296 Repeated falls: Secondary | ICD-10-CM | POA: Diagnosis not present

## 2016-01-21 DIAGNOSIS — N183 Chronic kidney disease, stage 3 (moderate): Secondary | ICD-10-CM | POA: Diagnosis not present

## 2016-01-21 DIAGNOSIS — M549 Dorsalgia, unspecified: Secondary | ICD-10-CM | POA: Diagnosis not present

## 2016-01-21 DIAGNOSIS — I129 Hypertensive chronic kidney disease with stage 1 through stage 4 chronic kidney disease, or unspecified chronic kidney disease: Secondary | ICD-10-CM | POA: Diagnosis not present

## 2016-01-21 DIAGNOSIS — I2510 Atherosclerotic heart disease of native coronary artery without angina pectoris: Secondary | ICD-10-CM | POA: Diagnosis not present

## 2016-01-21 DIAGNOSIS — K219 Gastro-esophageal reflux disease without esophagitis: Secondary | ICD-10-CM | POA: Diagnosis not present

## 2016-01-21 DIAGNOSIS — Z9181 History of falling: Secondary | ICD-10-CM | POA: Diagnosis not present

## 2016-01-21 DIAGNOSIS — I4891 Unspecified atrial fibrillation: Secondary | ICD-10-CM | POA: Diagnosis not present

## 2016-01-21 DIAGNOSIS — Z87891 Personal history of nicotine dependence: Secondary | ICD-10-CM | POA: Diagnosis not present

## 2016-01-21 DIAGNOSIS — Z7901 Long term (current) use of anticoagulants: Secondary | ICD-10-CM | POA: Diagnosis not present

## 2016-01-21 NOTE — Patient Outreach (Signed)
[facility]) Care Management  01/21/2016  Cordarrell Olascoaga 01/03/1924 [id number]   RN followed up with pt's daughter Allena Earing) who reports she has had a visit from a Education officer, museum via [employer] care [place]) and received a lot of the information this RN discussed with the pt last week. Repots information received about the Aide & Enrichment programs, Veteran Services, [place], PACE and [employer] through [place].  Daughter states there was a discussion also on memory units at the local long term placement facilities.  No additional resources needed at this time as the daughter indicates sheis taking time off from work to inquire further on some of these programs. Daughter feels she no longer needs consultation with the [facility] social worker Mechele Claude S.) and at this time there are no nursing needs or needed resources to arrange for this pt. RN will close this case and notify the new provider Dr. Dewaine Oats. Daughter aware if further assistance is needed to contact the [facility] office directly.   Raina Mina, RN Care Management Coordinator [employer] Office [phone]

## 2016-01-21 NOTE — Patient Outreach (Signed)
[facility]) Care Management  01/21/2016  Ariano Inda 1924/01/07 [id number]   CSW received an email, voicemail and an [employer] message from Raina Mina, [facility] with [employer], indicating that patient is no longer interested in receiving social work services through [facility].  Ms. Zigmund Daniel went on to say that patient is already receiving home health services through [facility], including a home health social worker.  The social worker is in the process of assisting patient with all of the needed services that were requested of this CSW.  No additional social work needs identified at this time.  Case closure performed. Nat Christen, BSW, MSW, LCSW  Licensed Education officer, [facility]  Mailing [place] [address] [place], [place] [address] Physical Address-[address], [place], [place] [address] Toll Free Main # [phone] Fax # [phone] Cell # [phone]  Fax # [phone]  [email or web address]

## 2016-01-27 ENCOUNTER — Ambulatory Visit: Payer: Self-pay | Admitting: *Deleted

## 2016-01-28 ENCOUNTER — Telehealth: Payer: Self-pay | Admitting: Nurse Practitioner

## 2016-01-28 NOTE — Telephone Encounter (Signed)
ok 

## 2016-01-28 NOTE — Telephone Encounter (Signed)
Christine aware verbal order approved

## 2016-01-29 ENCOUNTER — Emergency Department (HOSPITAL_BASED_OUTPATIENT_CLINIC_OR_DEPARTMENT_OTHER): Payer: Medicare Other

## 2016-01-29 ENCOUNTER — Telehealth: Payer: Self-pay | Admitting: *Deleted

## 2016-01-29 ENCOUNTER — Encounter (HOSPITAL_BASED_OUTPATIENT_CLINIC_OR_DEPARTMENT_OTHER): Payer: Self-pay | Admitting: Emergency Medicine

## 2016-01-29 ENCOUNTER — Inpatient Hospital Stay (HOSPITAL_BASED_OUTPATIENT_CLINIC_OR_DEPARTMENT_OTHER)
Admission: EM | Admit: 2016-01-29 | Discharge: 2016-02-02 | DRG: 682 | Disposition: A | Discharge status: Skilled Nursing Facility | Payer: Medicare Other | Attending: Internal Medicine | Admitting: Internal Medicine

## 2016-01-29 DIAGNOSIS — D649 Anemia, unspecified: Secondary | ICD-10-CM

## 2016-01-29 DIAGNOSIS — Z823 Family history of stroke: Secondary | ICD-10-CM

## 2016-01-29 DIAGNOSIS — R296 Repeated falls: Secondary | ICD-10-CM | POA: Diagnosis present

## 2016-01-29 DIAGNOSIS — K219 Gastro-esophageal reflux disease without esophagitis: Secondary | ICD-10-CM | POA: Diagnosis present

## 2016-01-29 DIAGNOSIS — N179 Acute kidney failure, unspecified: Secondary | ICD-10-CM | POA: Diagnosis not present

## 2016-01-29 DIAGNOSIS — N19 Unspecified kidney failure: Secondary | ICD-10-CM

## 2016-01-29 DIAGNOSIS — I480 Paroxysmal atrial fibrillation: Secondary | ICD-10-CM | POA: Diagnosis present

## 2016-01-29 DIAGNOSIS — Z79899 Other long term (current) drug therapy: Secondary | ICD-10-CM

## 2016-01-29 DIAGNOSIS — R05 Cough: Secondary | ICD-10-CM

## 2016-01-29 DIAGNOSIS — Z66 Do not resuscitate: Secondary | ICD-10-CM | POA: Diagnosis present

## 2016-01-29 DIAGNOSIS — I10 Essential (primary) hypertension: Secondary | ICD-10-CM | POA: Diagnosis present

## 2016-01-29 DIAGNOSIS — R410 Disorientation, unspecified: Secondary | ICD-10-CM | POA: Diagnosis not present

## 2016-01-29 DIAGNOSIS — Z8249 Family history of ischemic heart disease and other diseases of the circulatory system: Secondary | ICD-10-CM

## 2016-01-29 DIAGNOSIS — Z87891 Personal history of nicotine dependence: Secondary | ICD-10-CM

## 2016-01-29 DIAGNOSIS — F03918 Unspecified dementia, unspecified severity, with other behavioral disturbance: Secondary | ICD-10-CM | POA: Diagnosis present

## 2016-01-29 DIAGNOSIS — D72819 Decreased white blood cell count, unspecified: Secondary | ICD-10-CM | POA: Diagnosis present

## 2016-01-29 DIAGNOSIS — N184 Chronic kidney disease, stage 4 (severe): Secondary | ICD-10-CM | POA: Diagnosis present

## 2016-01-29 DIAGNOSIS — H109 Unspecified conjunctivitis: Secondary | ICD-10-CM

## 2016-01-29 DIAGNOSIS — N400 Enlarged prostate without lower urinary tract symptoms: Secondary | ICD-10-CM | POA: Diagnosis present

## 2016-01-29 DIAGNOSIS — R627 Adult failure to thrive: Secondary | ICD-10-CM | POA: Diagnosis present

## 2016-01-29 DIAGNOSIS — Z7901 Long term (current) use of anticoagulants: Secondary | ICD-10-CM

## 2016-01-29 DIAGNOSIS — N189 Chronic kidney disease, unspecified: Secondary | ICD-10-CM

## 2016-01-29 DIAGNOSIS — I2510 Atherosclerotic heart disease of native coronary artery without angina pectoris: Secondary | ICD-10-CM | POA: Diagnosis present

## 2016-01-29 DIAGNOSIS — E860 Dehydration: Secondary | ICD-10-CM | POA: Diagnosis present

## 2016-01-29 DIAGNOSIS — N183 Chronic kidney disease, stage 3 (moderate): Secondary | ICD-10-CM | POA: Diagnosis present

## 2016-01-29 DIAGNOSIS — F0391 Unspecified dementia with behavioral disturbance: Secondary | ICD-10-CM | POA: Diagnosis present

## 2016-01-29 DIAGNOSIS — I129 Hypertensive chronic kidney disease with stage 1 through stage 4 chronic kidney disease, or unspecified chronic kidney disease: Secondary | ICD-10-CM | POA: Diagnosis present

## 2016-01-29 DIAGNOSIS — R059 Cough, unspecified: Secondary | ICD-10-CM

## 2016-01-29 DIAGNOSIS — H5712 Ocular pain, left eye: Secondary | ICD-10-CM

## 2016-01-29 DIAGNOSIS — E785 Hyperlipidemia, unspecified: Secondary | ICD-10-CM | POA: Diagnosis present

## 2016-01-29 DIAGNOSIS — D638 Anemia in other chronic diseases classified elsewhere: Secondary | ICD-10-CM

## 2016-01-29 DIAGNOSIS — G9341 Metabolic encephalopathy: Secondary | ICD-10-CM | POA: Diagnosis present

## 2016-01-29 DIAGNOSIS — G9340 Encephalopathy, unspecified: Secondary | ICD-10-CM | POA: Diagnosis present

## 2016-01-29 LAB — URINALYSIS, ROUTINE W REFLEX MICROSCOPIC
Bilirubin Urine: NEGATIVE
Glucose, UA: NEGATIVE mg/dL
Hgb urine dipstick: NEGATIVE
Ketones, ur: NEGATIVE mg/dL
Leukocytes, UA: NEGATIVE
Nitrite: NEGATIVE
Protein, ur: NEGATIVE mg/dL
Specific Gravity, Urine: 1.01 (ref 1.005–1.030)
pH: 5.5 (ref 5.0–8.0)

## 2016-01-29 LAB — CBC WITH DIFFERENTIAL/PLATELET
Basophils Absolute: 0 10*3/uL (ref 0.0–0.1)
Basophils Relative: 0 %
Eosinophils Absolute: 0 10*3/uL (ref 0.0–0.7)
Eosinophils Relative: 1 %
HCT: 30.6 % — ABNORMAL LOW (ref 39.0–52.0)
Hemoglobin: 9.8 g/dL — ABNORMAL LOW (ref 13.0–17.0)
Lymphocytes Relative: 25 %
Lymphs Abs: 0.7 10*3/uL (ref 0.7–4.0)
MCH: 31.9 pg (ref 26.0–34.0)
MCHC: 32 g/dL (ref 30.0–36.0)
MCV: 99.7 fL (ref 78.0–100.0)
Monocytes Absolute: 0.3 10*3/uL (ref 0.1–1.0)
Monocytes Relative: 10 %
Neutro Abs: 1.9 10*3/uL (ref 1.7–7.7)
Neutrophils Relative %: 64 %
Platelets: 168 10*3/uL (ref 150–400)
RBC: 3.07 MIL/uL — ABNORMAL LOW (ref 4.22–5.81)
RDW: 13.8 % (ref 11.5–15.5)
WBC: 2.9 10*3/uL — ABNORMAL LOW (ref 4.0–10.5)

## 2016-01-29 LAB — BASIC METABOLIC PANEL
Anion gap: 6 (ref 5–15)
BUN: 26 mg/dL — ABNORMAL HIGH (ref 6–20)
CO2: 20 mmol/L — ABNORMAL LOW (ref 22–32)
Calcium: 8.2 mg/dL — ABNORMAL LOW (ref 8.9–10.3)
Chloride: 112 mmol/L — ABNORMAL HIGH (ref 101–111)
Creatinine, Ser: 3.22 mg/dL — ABNORMAL HIGH (ref 0.61–1.24)
GFR calc Af Amer: 18 mL/min — ABNORMAL LOW (ref >60)
GFR calc non Af Amer: 16 mL/min — ABNORMAL LOW (ref >60)
Glucose, Bld: 92 mg/dL (ref 65–99)
Potassium: 4.4 mmol/L (ref 3.5–5.1)
Sodium: 138 mmol/L (ref 135–145)

## 2016-01-29 LAB — OCCULT BLOOD X 1 CARD TO LAB, STOOL: Fecal Occult Bld: NEGATIVE

## 2016-01-29 MED ORDER — HYDRALAZINE HCL 25 MG PO TABS
50.0000 mg | ORAL_TABLET | Freq: Once | ORAL | Status: AC
  Administered 2016-01-29: 50 mg via ORAL
  Filled 2016-01-29: qty 2

## 2016-01-29 MED ORDER — BACITRACIN-POLYMYXIN B 500-10000 UNIT/GM OP OINT
TOPICAL_OINTMENT | Freq: Four times a day (QID) | OPHTHALMIC | Status: DC
  Administered 2016-01-30 (×5): via OPHTHALMIC
  Administered 2016-01-31: 1 via OPHTHALMIC
  Administered 2016-02-01 (×3): via OPHTHALMIC
  Administered 2016-02-01: 1 via OPHTHALMIC
  Administered 2016-02-02 (×3): via OPHTHALMIC
  Filled 2016-01-29 (×2): qty 3.5

## 2016-01-29 MED ORDER — FLUORESCEIN SODIUM 1 MG OP STRP
1.0000 | ORAL_STRIP | Freq: Once | OPHTHALMIC | Status: AC
  Administered 2016-01-29: 1 via OPHTHALMIC
  Filled 2016-01-29: qty 1

## 2016-01-29 MED ORDER — SODIUM CHLORIDE 0.9 % IV BOLUS (SEPSIS)
500.0000 mL | Freq: Once | INTRAVENOUS | Status: AC
  Administered 2016-01-29: 500 mL via INTRAVENOUS

## 2016-01-29 MED ORDER — TETRACAINE HCL 0.5 % OP SOLN
2.0000 [drp] | Freq: Once | OPHTHALMIC | Status: AC
  Administered 2016-01-29: 2 [drp] via OPHTHALMIC
  Filled 2016-01-29: qty 4

## 2016-01-29 NOTE — Plan of Care (Signed)
Patient with dementia.  In to ED today with Moderate L eye pain, redness, eyelid swelling.  Pupil is 85mm non-reactive and cloudy.  When patient arrives call Dr. Schuyler Amor to let him know patient is here (apparently Dr. Schuyler Amor will see patient in hospital tonight per EDP).  For right now just topical ABx for eye, no orders from Dr. Schuyler Amor for acetazolamide etc.  Pressure was 28 mmHg.  Eye pain worse with movement of eye.  Also has BP that was initially in the 200s, has come down to 160s with hydralazine.  Is confused and demented, wont do finger to nose testing, no other focal neurologic deficits to suggest a stroke.  Family currently looking for NH placement for patient due to chronic progressive cognitives decline.  Patient accepted to tele bed.  Call Dr. Schuyler Amor when patient arrives to let him know patient is here.

## 2016-01-29 NOTE — ED Notes (Signed)
Patient being transferred to [facility]. Daughter no longer at bedside. She is aware that the patient is going to be D/c'd and is ok with the transfer to [facility]. Opal Sidles, RN at bedside to witness the ok to transfer

## 2016-01-29 NOTE — ED Notes (Signed)
Patient is unable to do visual Acuity. Does not understand the instructions and can not follow through appropriate

## 2016-01-29 NOTE — Telephone Encounter (Signed)
Sharyn Lull with [facility] called in home with patient and stated that patient is complaining of severe left eye pain, edema to upper and lower eyelid, eye red and reduced pupil response. And headache. Cognitive issues. Showing severe signs of confusion. Cannot follow instructions.  Complaining also with pain and burning with urination. Family expresses concern about not being able to care for the patient.  Vitals stable and does appear to be dehydrated and pulse elevated.   I instructed nurse to go ahead and send patient to Hospital to be evaluated. Nurse agreed.

## 2016-01-29 NOTE — ED Triage Notes (Signed)
Daughter states that pt has had eye pain and more confusion that normal for about 2 weeks.  Home health nurse told them to come to ED

## 2016-01-29 NOTE — ED Provider Notes (Signed)
[facility] DEPT [facility] Provider Note   CSN: [id number] Arrival date & time: 01/29/16  1710  By signing my name below, I, Wayne Fuller, attest that this documentation has been prepared under the direction and in the presence of Wayne Johns, MD. Electronically Signed: Hansel Fuller, ED Scribe. 01/29/16. 6:29 PM.     History   Chief Complaint Chief Complaint  Patient presents with  . Headache  . Eye Pain    HPI Wayne Fuller is a 82 y.o. male with h/o dementia who presents to the Emergency Department complaining of moderate left eye pain, redness and eyelid swelling onset today. Per daughter, the pts home care nurse reported to her that he was complaining of more eye pain and HA than usual earlier in the day. Home nurse also reports he seemed more confused than normal and also complained of dysuria. Pt denies injury or trauma to the eye, recent falls. Pt states his eye pain is worsened with ocular movement. Daughter reports the pt has had similar symptoms in the left eye last month, but not as severe. No h/o left eye blindness or cataracts. Daughter reports the pt has not been seen by an ophthalmologist in at least a year. Daughter also notes that the pt has recently had a mild cough. Pt lives by himself and was independent prior to this year. Daughter states the family is seeking placement for the patient, but is currently rotating care among siblings. Pt denies visual disturbance. Daughter denies emesis.    The history is provided by the patient, a relative and a caregiver. No language interpreter was used.    Past Medical History:  Diagnosis Date  . BPH (benign prostatic hyperplasia)   . Chronic kidney disease    ckd stage 3  . Dementia   . GERD (gastroesophageal reflux disease)   . Hemorrhoids   . History of angina   . Hyperlipemia   . Hypertension   . Irregular heart rhythm   . Paroxysmal a-fib ([place]) 04/02/2015    Patient Active Problem List   Diagnosis Date Noted  .  Confusion 01/29/2016  . Dementia with behavioral disturbance 01/19/2016  . Back pain 01/19/2016  . Elevated troponin 04/02/2015  . Atrial fibrillation ([place]) 04/02/2015  . CKD (chronic kidney disease) stage 3, GFR 30-59 ml/min 08/11/2014  . GERD (gastroesophageal reflux disease) 08/11/2014  . Chest pain at rest 08/11/2014  . Chest pain 04/03/2014  . LEUKOPENIA, MILD 04/29/2009  . Essential hypertension 04/29/2009  . CAD (coronary artery disease) 04/29/2009  . CHEST PAIN, PLEURITIC 04/29/2009  . TOBACCO ABUSE, HX OF 04/29/2009    Past Surgical History:  Procedure Laterality Date  . BACK SURGERY    . CERVICAL SPINE SURGERY    . SPINE SURGERY         Home Medications    Prior to Admission medications   Medication Sig Start Date End Date Taking? Authorizing Provider  amiodarone (PACERONE) 200 MG tablet Take 200 mg by mouth daily.    Historical Provider, MD  apixaban (ELIQUIS) 2.5 MG TABS tablet Take 2.5 mg by mouth 2 (two) times daily.    Historical Provider, MD  Cholecalciferol (VITAMIN D3) 5000 units CAPS Take 1 capsule (5,000 Units total) by mouth daily. 01/19/16   [name], NP  diltiazem (CARDIZEM CD) 240 MG 24 hr capsule Take 240 mg by mouth daily. Will obtain today from pharmacy    Historical Provider, MD  hydrALAZINE (APRESOLINE) 50 MG tablet Take 50 mg by mouth 3 (  three) times daily.     Historical Provider, MD  metoprolol succinate (TOPROL-XL) 50 MG 24 hr tablet Take 50 mg by mouth daily. 02/18/15   Historical Provider, MD  tamsulosin (FLOMAX) 0.4 MG CAPS capsule Take 1 capsule (0.4 mg total) by mouth daily after supper. 01/19/16   [name], NP    Family History Family History  Problem Relation Age of Onset  . Stroke Mother   . Hypertension Sister   . Other Son     Homicide, stabbed  . Stroke Son   . Hypertension Son   . Diabetes Son   . Hypertension Son     Social History Social History  Substance Use Topics  . Smoking status: Former Smoker      Packs/day: 0.50    Years: 10.00    Types: Cigarettes  . Smokeless tobacco: Never Used     Comment: quit smoking  around 1996  . Alcohol use No     Comment: hx of occasional ETOH use     Allergies   Review of patient's allergies indicates no known allergies.   Review of Systems Review of Systems  Constitutional: Negative for chills, diaphoresis, fatigue and fever.  HENT: Negative for congestion, rhinorrhea and sneezing.   Eyes: Positive for pain and redness. Negative for visual disturbance.  Respiratory: Positive for cough. Negative for chest tightness and shortness of breath.   Cardiovascular: Negative for chest pain and leg swelling.  Gastrointestinal: Negative for abdominal pain, blood in stool, diarrhea, nausea and vomiting.  Genitourinary: Positive for dysuria. Negative for difficulty urinating, flank pain, frequency and hematuria.  Musculoskeletal: Negative for arthralgias and back pain.  Skin: Negative for rash.  Neurological: Negative for dizziness, speech difficulty, weakness and numbness.  Psychiatric/Behavioral: Positive for confusion.     Physical Exam Updated Vital Signs BP 162/57   Pulse 71   Temp 98.3 F (36.8 C) (Oral)   Resp 19   Ht 5\' 10"  (1.778 m)   Wt 156 lb (70.8 kg)   SpO2 100%   BMI 22.38 kg/m   Physical Exam  Constitutional: He appears well-developed and well-nourished.  HENT:  Head: Normocephalic and atraumatic.  Eyes:  Left eye conjunctiva is injected with watery drainage.  There is cloudiness to the cornea.  Pupil is 4 mm and nonreactive. The right pupil is 2 mm. EOM are intact. Pt is able to see my fingers and tell me the number that I am holding up. No rashes.   Pressure is measuring 28.  No staining areas on Wood's lamp exam  Neck: Normal range of motion. Neck supple.  Cardiovascular: Normal rate and regular rhythm.   Murmur heard. Pulmonary/Chest: Effort normal and breath sounds normal. No respiratory distress. He has no wheezes.  He has no rales. He exhibits no tenderness.  Abdominal: Soft. Bowel sounds are normal. There is no tenderness. There is no rebound and no guarding.  Musculoskeletal: Normal range of motion. He exhibits no edema.  Lymphadenopathy:    He has no cervical adenopathy.  Neurological: He is alert.  Oriented to person only  Skin: Skin is warm and dry. No rash noted.  Psychiatric: He has a normal mood and affect.  Nursing note and vitals reviewed.  ED ECG REPORT   Date: 01/29/2016  Rate: 63  Rhythm: normal sinus rhythm  QRS Axis: normal  Intervals: normal  ST/T Wave abnormalities: nonspecific ST/T changes  Conduction Disutrbances:none  Narrative Interpretation:   Old EKG Reviewed: unchanged  I have personally  reviewed the EKG tracing and agree with the computerized printout as noted.   ED Treatments / Results   DIAGNOSTIC STUDIES: Oxygen Saturation is 100% on RA, normal by my interpretation.    COORDINATION OF CARE: 6:27 PM Discussed treatment plan with pt at bedside which includes lab work and pt agreed to plan.    Labs (all labs ordered are listed, but only abnormal results are displayed) Labs Reviewed  BASIC METABOLIC PANEL - Abnormal; Notable for the following:       Result Value   Chloride 112 (*)    CO2 20 (*)    BUN 26 (*)    Creatinine, Ser 3.22 (*)    Calcium 8.2 (*)    GFR calc non Af Amer 16 (*)    GFR calc Af Amer 18 (*)    All other components within normal limits  CBC WITH DIFFERENTIAL/PLATELET - Abnormal; Notable for the following:    WBC 2.9 (*)    RBC 3.07 (*)    Hemoglobin 9.8 (*)    HCT 30.6 (*)    All other components within normal limits  URINALYSIS, ROUTINE W REFLEX MICROSCOPIC (NOT AT [facility])  OCCULT BLOOD X 1 CARD TO LAB, STOOL    EKG  EKG Interpretation None       Radiology Ct Head Wo Contrast  Result Date: 01/29/2016 CLINICAL DATA:  Fall, weakness, confusion EXAM: CT HEAD WITHOUT CONTRAST TECHNIQUE: Contiguous axial images were  obtained from the base of the skull through the vertex without intravenous contrast. COMPARISON:  01/11/2016 FINDINGS: Brain: No intracranial hemorrhage, mass effect or midline shift. Mild cerebral atrophy again noted. Stable chronic white matter disease. No definite acute cortical infarction. No mass lesion is noted on this unenhanced scan. Vascular: No hyperdense vessel or unexpected calcification. Skull: Normal. Negative for fracture or focal lesion. Sinuses/Orbits: No acute finding. Other: None. IMPRESSION: No acute intracranial abnormality. Stable atrophy and chronic white matter disease. No definite acute cortical infarction. Electronically Signed   By: Lahoma Crocker M.D.   On: 01/29/2016 18:58    Procedures Procedures (including critical care time)  Medications Ordered in ED Medications  bacitracin-polymyxin b (POLYSPORIN) ophthalmic ointment (not administered)  fluorescein ophthalmic strip 1 strip (1 strip Left Eye Given 01/29/16 1915)  tetracaine (PONTOCAINE) 0.5 % ophthalmic solution 2 drop (2 drops Left Eye Given 01/29/16 1914)  sodium chloride 0.9 % bolus 500 mL (500 mLs Intravenous New Bag/Given 01/29/16 2013)  hydrALAZINE (APRESOLINE) tablet 50 mg (50 mg Oral Given 01/29/16 2048)     Initial Impression / Assessment and Plan / ED Course  I have reviewed the triage vital signs and the nursing notes.  Pertinent labs & imaging results that were available during my care of the patient were reviewed by me and considered in my medical decision making (see chart for details).  Clinical Course    Patient is a 82 year old male patient with history of dementia he's had recent onset of worsening confusion and increase in falls.  He is on Eliquis. His head CT doesn't show any acute abnormalities. He has swelling around his left eye and injection of the conjunctiva. With watery drainage. There is evidence of a conjunctivitis. He does have a nonreactive pupil. I'm not sure if this is chronic or acute but  is not documented on any recent H&P's. He has increase in generalized weakness with urinary symptoms. His urine does not appear to be infected. He has a slight drop in his hemoglobin at 9.8. His creatinine  is bumped from his prior values as well. He may be partially dehydrated. He does live alone and does have family members that come in and out but is by himself as well. It's unclear what his normal oral intake is. I spoke with Dr. Schuyler Amor with ophthalmology who advises to start the patient on antibacterial ointment and he will see the patient in consult. I feel given the patient's acute worsening and probable dehydration, he should be admitted for some hydration and observation. I also should be decided if he needs to continue Eliquis given his ongoing falls. I will consult the hospitalist for admission for observation and further treatment.  I spoke with Dr. Alcario Drought who has accepted pt for transfer to [facility] tele/obs.  Final Clinical Impressions(s) / ED Diagnoses   Final diagnoses:  Conjunctivitis of left eye  Eye pain, left  Essential hypertension  Anemia, unspecified anemia type  Renal failure    New Prescriptions New Prescriptions   No medications on file    I personally performed the services described in this documentation, which was scribed in my presence.  The recorded information has been reviewed and considered.    Wayne Johns, MD 01/29/16 2224

## 2016-01-29 NOTE — ED Notes (Signed)
Patient urinated in Urinal, without assistance. The patient appears to be more and more confused. Per family member at the bedside this is the patients "norm"

## 2016-01-29 NOTE — Progress Notes (Signed)
Hydrologist at [facility].  Spoke to nurse Claiborne Billings, who gave me report on patient.  Hx of dementia, which appears to worsen at night.  Presented with L eye pain and periorbital swelling, daughter stated he was becoming more and more confused.  Awaiting patients arrival.

## 2016-01-30 ENCOUNTER — Inpatient Hospital Stay (HOSPITAL_COMMUNITY): Payer: Medicare Other

## 2016-01-30 ENCOUNTER — Encounter (HOSPITAL_COMMUNITY): Payer: Self-pay | Admitting: Internal Medicine

## 2016-01-30 DIAGNOSIS — R296 Repeated falls: Secondary | ICD-10-CM | POA: Diagnosis present

## 2016-01-30 DIAGNOSIS — D638 Anemia in other chronic diseases classified elsewhere: Secondary | ICD-10-CM

## 2016-01-30 DIAGNOSIS — F0391 Unspecified dementia with behavioral disturbance: Secondary | ICD-10-CM | POA: Diagnosis present

## 2016-01-30 DIAGNOSIS — Z66 Do not resuscitate: Secondary | ICD-10-CM | POA: Diagnosis present

## 2016-01-30 DIAGNOSIS — H109 Unspecified conjunctivitis: Secondary | ICD-10-CM | POA: Diagnosis present

## 2016-01-30 DIAGNOSIS — E785 Hyperlipidemia, unspecified: Secondary | ICD-10-CM | POA: Diagnosis present

## 2016-01-30 DIAGNOSIS — Z87891 Personal history of nicotine dependence: Secondary | ICD-10-CM | POA: Diagnosis not present

## 2016-01-30 DIAGNOSIS — Z8249 Family history of ischemic heart disease and other diseases of the circulatory system: Secondary | ICD-10-CM | POA: Diagnosis not present

## 2016-01-30 DIAGNOSIS — E860 Dehydration: Secondary | ICD-10-CM | POA: Diagnosis present

## 2016-01-30 DIAGNOSIS — I129 Hypertensive chronic kidney disease with stage 1 through stage 4 chronic kidney disease, or unspecified chronic kidney disease: Secondary | ICD-10-CM | POA: Diagnosis present

## 2016-01-30 DIAGNOSIS — K219 Gastro-esophageal reflux disease without esophagitis: Secondary | ICD-10-CM | POA: Diagnosis present

## 2016-01-30 DIAGNOSIS — N19 Unspecified kidney failure: Secondary | ICD-10-CM | POA: Diagnosis present

## 2016-01-30 DIAGNOSIS — I10 Essential (primary) hypertension: Secondary | ICD-10-CM

## 2016-01-30 DIAGNOSIS — D649 Anemia, unspecified: Secondary | ICD-10-CM

## 2016-01-30 DIAGNOSIS — N183 Chronic kidney disease, stage 3 (moderate): Secondary | ICD-10-CM

## 2016-01-30 DIAGNOSIS — G9340 Encephalopathy, unspecified: Secondary | ICD-10-CM

## 2016-01-30 DIAGNOSIS — R627 Adult failure to thrive: Secondary | ICD-10-CM | POA: Diagnosis present

## 2016-01-30 DIAGNOSIS — Z823 Family history of stroke: Secondary | ICD-10-CM | POA: Diagnosis not present

## 2016-01-30 DIAGNOSIS — Z7901 Long term (current) use of anticoagulants: Secondary | ICD-10-CM | POA: Diagnosis not present

## 2016-01-30 DIAGNOSIS — N189 Chronic kidney disease, unspecified: Secondary | ICD-10-CM | POA: Diagnosis not present

## 2016-01-30 DIAGNOSIS — I2510 Atherosclerotic heart disease of native coronary artery without angina pectoris: Secondary | ICD-10-CM | POA: Diagnosis present

## 2016-01-30 DIAGNOSIS — R410 Disorientation, unspecified: Secondary | ICD-10-CM | POA: Diagnosis present

## 2016-01-30 DIAGNOSIS — D72819 Decreased white blood cell count, unspecified: Secondary | ICD-10-CM | POA: Diagnosis present

## 2016-01-30 DIAGNOSIS — N400 Enlarged prostate without lower urinary tract symptoms: Secondary | ICD-10-CM | POA: Diagnosis present

## 2016-01-30 DIAGNOSIS — N179 Acute kidney failure, unspecified: Secondary | ICD-10-CM | POA: Diagnosis present

## 2016-01-30 DIAGNOSIS — G9341 Metabolic encephalopathy: Secondary | ICD-10-CM | POA: Diagnosis present

## 2016-01-30 DIAGNOSIS — I25119 Atherosclerotic heart disease of native coronary artery with unspecified angina pectoris: Secondary | ICD-10-CM

## 2016-01-30 DIAGNOSIS — Z79899 Other long term (current) drug therapy: Secondary | ICD-10-CM | POA: Diagnosis not present

## 2016-01-30 DIAGNOSIS — I480 Paroxysmal atrial fibrillation: Secondary | ICD-10-CM | POA: Diagnosis present

## 2016-01-30 DIAGNOSIS — H5712 Ocular pain, left eye: Secondary | ICD-10-CM | POA: Diagnosis present

## 2016-01-30 LAB — COMPREHENSIVE METABOLIC PANEL
ALT: 12 U/L — ABNORMAL LOW (ref 17–63)
AST: 16 U/L (ref 15–41)
Albumin: 3.1 g/dL — ABNORMAL LOW (ref 3.5–5.0)
Alkaline Phosphatase: 57 U/L (ref 38–126)
Anion gap: 8 (ref 5–15)
BUN: 24 mg/dL — ABNORMAL HIGH (ref 6–20)
CO2: 21 mmol/L — ABNORMAL LOW (ref 22–32)
Calcium: 8.3 mg/dL — ABNORMAL LOW (ref 8.9–10.3)
Chloride: 113 mmol/L — ABNORMAL HIGH (ref 101–111)
Creatinine, Ser: 2.97 mg/dL — ABNORMAL HIGH (ref 0.61–1.24)
GFR calc Af Amer: 20 mL/min — ABNORMAL LOW (ref >60)
GFR calc non Af Amer: 17 mL/min — ABNORMAL LOW (ref >60)
Glucose, Bld: 88 mg/dL (ref 65–99)
Potassium: 4.4 mmol/L (ref 3.5–5.1)
Sodium: 142 mmol/L (ref 135–145)
Total Bilirubin: 0.3 mg/dL (ref 0.3–1.2)
Total Protein: 5.7 g/dL — ABNORMAL LOW (ref 6.5–8.1)

## 2016-01-30 LAB — VITAMIN B12: Vitamin B-12: 208 pg/mL (ref 180–914)

## 2016-01-30 LAB — CREATININE, URINE, RANDOM: Creatinine, Urine: 75.23 mg/dL

## 2016-01-30 LAB — IRON AND TIBC
Iron: 55 ug/dL (ref 45–182)
Saturation Ratios: 22 % (ref 17.9–39.5)
TIBC: 246 ug/dL — ABNORMAL LOW (ref 250–450)
UIBC: 191 ug/dL

## 2016-01-30 LAB — RETICULOCYTES
RBC.: 3.09 MIL/uL — ABNORMAL LOW (ref 4.22–5.81)
Retic Count, Absolute: 34 10*3/uL (ref 19.0–186.0)
Retic Ct Pct: 1.1 % (ref 0.4–3.1)

## 2016-01-30 LAB — MAGNESIUM: Magnesium: 2 mg/dL (ref 1.7–2.4)

## 2016-01-30 LAB — TSH: TSH: 2.28 u[IU]/mL (ref 0.350–4.500)

## 2016-01-30 LAB — FOLATE: Folate: 6.5 ng/mL (ref >5.9)

## 2016-01-30 LAB — CBC
HCT: 30.2 % — ABNORMAL LOW (ref 39.0–52.0)
Hemoglobin: 9.5 g/dL — ABNORMAL LOW (ref 13.0–17.0)
MCH: 30.7 pg (ref 26.0–34.0)
MCHC: 31.5 g/dL (ref 30.0–36.0)
MCV: 97.7 fL (ref 78.0–100.0)
Platelets: 163 10*3/uL (ref 150–400)
RBC: 3.09 MIL/uL — ABNORMAL LOW (ref 4.22–5.81)
RDW: 14.3 % (ref 11.5–15.5)
WBC: 3.1 10*3/uL — ABNORMAL LOW (ref 4.0–10.5)

## 2016-01-30 LAB — FERRITIN: Ferritin: 65 ng/mL (ref 24–336)

## 2016-01-30 LAB — PHOSPHORUS: Phosphorus: 3.9 mg/dL (ref 2.5–4.6)

## 2016-01-30 LAB — SODIUM, URINE, RANDOM: Sodium, Ur: 114 mmol/L

## 2016-01-30 MED ORDER — ONDANSETRON HCL 4 MG/2ML IJ SOLN: 4.0000 mg | Freq: Four times a day (QID) | INTRAMUSCULAR | Status: DC | PRN

## 2016-01-30 MED ORDER — AMLODIPINE BESYLATE 10 MG PO TABS
10.0000 mg | ORAL_TABLET | Freq: Every day | ORAL | Status: DC
  Administered 2016-01-30 – 2016-02-02 (×3): 10 mg via ORAL
  Filled 2016-01-30 (×3): qty 1

## 2016-01-30 MED ORDER — HYDRALAZINE HCL 50 MG PO TABS: 50.0000 mg | ORAL_TABLET | Freq: Three times a day (TID) | ORAL | Status: DC

## 2016-01-30 MED ORDER — HEPARIN SODIUM (PORCINE) 5000 UNIT/ML IJ SOLN
5000.0000 [IU] | Freq: Three times a day (TID) | INTRAMUSCULAR | Status: DC
  Administered 2016-01-30 – 2016-02-02 (×9): 5000 [IU] via SUBCUTANEOUS
  Filled 2016-01-30 (×9): qty 1

## 2016-01-30 MED ORDER — SODIUM CHLORIDE 0.9 % IV SOLN
INTRAVENOUS | Status: DC
  Administered 2016-01-30: 15:00:00 via INTRAVENOUS
  Administered 2016-01-31: 1000 mL via INTRAVENOUS

## 2016-01-30 MED ORDER — ACETAMINOPHEN 650 MG RE SUPP: 650.0000 mg | Freq: Four times a day (QID) | RECTAL | Status: DC | PRN

## 2016-01-30 MED ORDER — ACETAMINOPHEN 325 MG PO TABS: 650.0000 mg | ORAL_TABLET | Freq: Four times a day (QID) | ORAL | Status: DC | PRN

## 2016-01-30 MED ORDER — ENSURE ENLIVE PO LIQD
237.0000 mL | Freq: Two times a day (BID) | ORAL | Status: DC
  Administered 2016-01-30 – 2016-02-02 (×4): 237 mL via ORAL

## 2016-01-30 MED ORDER — METOPROLOL TARTRATE 25 MG PO TABS
25.0000 mg | ORAL_TABLET | Freq: Two times a day (BID) | ORAL | Status: DC
Start: 2016-01-30 — End: 2016-02-02
  Administered 2016-01-30 – 2016-02-02 (×5): 25 mg via ORAL
  Filled 2016-01-30 (×5): qty 1

## 2016-01-30 MED ORDER — HYDRALAZINE HCL 50 MG PO TABS
75.0000 mg | ORAL_TABLET | Freq: Three times a day (TID) | ORAL | Status: DC
  Administered 2016-01-30 – 2016-01-31 (×5): 75 mg via ORAL
  Filled 2016-01-30 (×5): qty 1

## 2016-01-30 MED ORDER — POLYETHYLENE GLYCOL 3350 17 G PO PACK: 17.0000 g | PACK | Freq: Every day | ORAL | Status: DC | PRN

## 2016-01-30 MED ORDER — HYDROCODONE-ACETAMINOPHEN 5-325 MG PO TABS: 1.0000 | ORAL_TABLET | ORAL | Status: DC | PRN

## 2016-01-30 MED ORDER — ONDANSETRON HCL 4 MG PO TABS: 4.0000 mg | ORAL_TABLET | Freq: Four times a day (QID) | ORAL | Status: DC | PRN

## 2016-01-30 MED ORDER — SODIUM CHLORIDE 0.9% FLUSH
3.0000 mL | Freq: Two times a day (BID) | INTRAVENOUS | Status: DC
  Administered 2016-01-30 – 2016-02-02 (×4): 3 mL via INTRAVENOUS

## 2016-01-30 MED ORDER — AMIODARONE HCL 200 MG PO TABS
200.0000 mg | ORAL_TABLET | Freq: Every day | ORAL | Status: DC
  Administered 2016-01-30 – 2016-02-02 (×3): 200 mg via ORAL
  Filled 2016-01-30 (×3): qty 1

## 2016-01-30 MED ORDER — SODIUM CHLORIDE 0.9 % IV SOLN
INTRAVENOUS | Status: DC
  Administered 2016-01-30: 1000 mL via INTRAVENOUS

## 2016-01-30 MED ORDER — METOPROLOL TARTRATE 25 MG PO TABS: 25.0000 mg | ORAL_TABLET | Freq: Two times a day (BID) | ORAL | Status: DC

## 2016-01-30 MED ORDER — SENNA 8.6 MG PO TABS
1.0000 | ORAL_TABLET | Freq: Two times a day (BID) | ORAL | Status: DC
  Administered 2016-01-30 – 2016-02-02 (×5): 8.6 mg via ORAL
  Filled 2016-01-30 (×6): qty 1

## 2016-01-30 MED ORDER — DILTIAZEM HCL ER COATED BEADS 240 MG PO CP24
240.0000 mg | ORAL_CAPSULE | Freq: Every day | ORAL | Status: DC
  Administered 2016-01-30 – 2016-02-02 (×3): 240 mg via ORAL
  Filled 2016-01-30 (×3): qty 1

## 2016-01-30 MED ORDER — TAMSULOSIN HCL 0.4 MG PO CAPS
0.4000 mg | ORAL_CAPSULE | Freq: Every day | ORAL | Status: DC
  Administered 2016-01-30 – 2016-02-02 (×4): 0.4 mg via ORAL
  Filled 2016-01-30 (×4): qty 1

## 2016-01-30 NOTE — Progress Notes (Signed)
SLP Cancellation Note  Patient Details Name: Azaria Bartell MRN: [id number] DOB: July 05, 1923   Cancelled treatment:       Reason Eval/Treat Not Completed: Patient at procedure or test/unavailable. Will reattempt as time allows.   Kern Reap, MA, CCC-SLP 01/30/2016, 10:46 AM

## 2016-01-30 NOTE — Progress Notes (Signed)
PROGRESS NOTE    Wayne Fuller  KPT:[id number] DOB: 01/06/1924 DOA: 01/29/2016 PCP: [name], NP  Brief Narrative: Wayne Fuller is a 82 y.o. male with medical history significant ofherniated disc on lumbar and cervical spine, dementia,   A. Fib, HTN, CKD Presented with Confusion frequent falls, eye swelling confusion has been ongoing and gradually worsening eye pain. He was seen by home health nurse was concerned family brought [facility] to [facility]. Patient has been feeling progressively worsening memory loss many months now ever since January. He lives alone since his wife passed away and family was concerned for his care. [facility] had been [facility] to dehydration resulting in falls  Assessment & Plan:  1. Metabolic encephalopathy -due to dehydration and worsening renal failure, in background of worsening dementia -continue IVF, monitor -avoid sedating meds -CT head unremarkable -also check CXR, diminished BS at bases  2. Advanced Dementia -check SLP eval, PT/OT consult -needs to live in a supervised environment, not safe to live alone  3. AKi on CKD3 -baseline creatinine 2.4 -hydrate and monitor, improving  4. P.Atrial fibrillation (HCC) -due to advanced age, progressive dementia and frequent falls, Dr.Doutova discussed risks/benefits of anticoagulation with family and decision was made to STop this in agreement with family - rate controlled, continue amiodarone, metoprolol and diltiazem  5. CAD (coronary artery disease)  -stable continue home medications  6.  Essential hypertension  -BP trending up, increase hydralazine, continue metoprolol, add norvasc  7.  Leukopenia -chronic, for > 1 year, defer to PCP  8. Left eye conjunctivitis -  -continue abx eye drops as recommended by Dr.Groat yesterday  DVT prophylaxis:  add heparin SQ      Code Status:    DNR/DNI Family Communication:  No family at bedside    Disposition Plan:    will need placement     Consultants:     Procedures:    Subjective: no complaints, pleasantly confused   Objective: Vitals:   01/29/16 2230 01/29/16 2303 01/30/16 0341 01/30/16 0815  BP: 187/68 166/78 130/74 (!) 203/68  Pulse: (!) 123 70 64 66  Resp: 19 18 20 16   Temp:   98.2 F (36.8 C) 98.5 F (36.9 C)  TempSrc:    Oral  SpO2: 100% 100% 96% 98%  Weight:      Height:        Intake/Output Summary (Last 24 hours) at 01/30/16 1152 Last data filed at 01/30/16 1000  Gross per 24 hour  Intake           131.25 ml  Output              500 ml  Net          -368.75 ml   Filed Weights   01/29/16 1716 01/29/16 2200  Weight: 70.8 kg (156 lb) 70.8 kg (156 lb)    Examination:  General exam: Appears calm and comfortable but pleasantly confused Respiratory system: diminished bs at bases Cardiovascular system: S1 & S2 heard, RRR. No JVD, murmurs, rubs, gallops or clicks. No pedal edema. Gastrointestinal system: Abdomen is nondistended, soft and nontender.  Normal bowel sounds heard. Central nervous system: Alert, confused, No gross  focal neurological deficits. Extremities: Symmetric 5 x 5 power. Skin: No rashes, lesions or ulcers Psychiatry: confused   Data Reviewed: I have personally reviewed following labs and imaging studies  CBC:  Recent Labs Lab 01/29/16 1916 01/30/16 0540  WBC 2.9* 3.1*  NEUTROABS  1.9  --   HGB 9.8* 9.5*  HCT 30.6* 30.2*  MCV 99.7 97.7  PLT 168 614   Basic Metabolic Panel:  Recent Labs Lab 01/29/16 1916 01/30/16 0540  NA 138 142  K 4.4 4.4  CL 112* 113*  CO2 20* 21*  GLUCOSE 92 88  BUN 26* 24*  CREATININE 3.22* 2.97*  CALCIUM 8.2* 8.3*  MG  --  2.0  PHOS  --  3.9   GFR: Estimated Creatinine Clearance: 16.6 mL/min (by C-G formula based on SCr of 2.97 mg/dL). Liver Function Tests:  Recent Labs Lab 01/30/16 0540  AST 16  ALT 12*  ALKPHOS 57  BILITOT 0.3  PROT 5.7*  ALBUMIN 3.1*   No results  for input(s): LIPASE, AMYLASE in the last 168 hours. No results for input(s): AMMONIA in the last 168 hours. Coagulation Profile: No results for input(s): INR, PROTIME in the last 168 hours. Cardiac Enzymes: No results for input(s): CKTOTAL, CKMB, CKMBINDEX, TROPONINI in the last 168 hours. BNP (last 3 results) No results for input(s): PROBNP in the last 8760 hours. HbA1C: No results for input(s): HGBA1C in the last 72 hours. CBG: No results for input(s): GLUCAP in the last 168 hours. Lipid Profile: No results for input(s): CHOL, HDL, LDLCALC, TRIG, CHOLHDL, LDLDIRECT in the last 72 hours. Thyroid Function Tests:  Recent Labs  01/30/16 0540  TSH 2.280   Anemia Panel:  Recent Labs  01/30/16 0540  VITAMINB12 208  FOLATE 6.5  FERRITIN 65  TIBC 246*  IRON 55  RETICCTPCT 1.1   Urine analysis:    Component Value Date/Time   COLORURINE YELLOW 01/29/2016 2040   APPEARANCEUR CLEAR 01/29/2016 2040   LABSPEC 1.010 01/29/2016 2040   PHURINE 5.5 01/29/2016 2040   GLUCOSEU NEGATIVE 01/29/2016 2040   HGBUR NEGATIVE 01/29/2016 2040   BILIRUBINUR NEGATIVE 01/29/2016 2040   KETONESUR NEGATIVE 01/29/2016 2040   PROTEINUR NEGATIVE 01/29/2016 2040   UROBILINOGEN 0.2 04/02/2015 1430   NITRITE NEGATIVE 01/29/2016 2040   LEUKOCYTESUR NEGATIVE 01/29/2016 2040   Sepsis Labs: @LABRCNTIP (procalcitonin:4,lacticidven:4)  )No results found for this or any previous visit (from the past 240 hour(s)).       Radiology Studies: Ct Head Wo Contrast  Result Date: 01/29/2016 CLINICAL DATA:  Fall, weakness, confusion EXAM: CT HEAD WITHOUT CONTRAST TECHNIQUE: Contiguous axial images were obtained from the base of the skull through the vertex without intravenous contrast. COMPARISON:  01/11/2016 FINDINGS: Brain: No intracranial hemorrhage, mass effect or midline shift. Mild cerebral atrophy again noted. Stable chronic white matter disease. No definite acute cortical infarction. No mass lesion is  noted on this unenhanced scan. Vascular: No hyperdense vessel or unexpected calcification. Skull: Normal. Negative for fracture or focal lesion. Sinuses/Orbits: No acute finding. Other: None. IMPRESSION: No acute intracranial abnormality. Stable atrophy and chronic white matter disease. No definite acute cortical infarction. Electronically Signed   By: Lahoma Crocker M.D.   On: 01/29/2016 18:58   US Renal  Result Date: 01/30/2016 CLINICAL DATA:  Acute on chronic renal failure EXAM: RENAL / URINARY TRACT ULTRASOUND COMPLETE COMPARISON:  CT scan 01/11/2016 FINDINGS: Right Kidney: Length: 8.9 cm in length. No hydronephrosis. There is diffuse increased echogenicity probable due to chronic medical renal disease. A cyst in midpole measures 1.3 x 1.4 cm. Cyst in lower pole measures 1.4 cm. Left Kidney: Length: 9.7 cm in length. There is diffuse increased echogenicity probable chronic medical renal disease. There is a cyst in midpole measures to by 2.3 cm. A cyst in  lower pole measures 2 x 1.8 cm. A second cyst in midpole measures 1.8 cm. Bladder: Appears normal for degree of bladder distention. IMPRESSION: 1. No hydronephrosis. Bilateral renal cortical increased echogenicity probable medical renal disease. Bilateral renal cysts. Unremarkable urinary bladder. Electronically Signed   By: Lahoma Crocker M.D.   On: 01/30/2016 11:28        Scheduled Meds: . amiodarone  200 mg Oral Daily  . amLODipine  10 mg Oral Daily  . bacitracin-polymyxin b   Left Eye QID  . diltiazem  240 mg Oral Daily  . feeding supplement (ENSURE ENLIVE)  237 mL Oral BID BM  . hydrALAZINE  75 mg Oral TID  . metoprolol tartrate  25 mg Oral BID  . senna  1 tablet Oral BID  . sodium chloride flush  3 mL Intravenous Q12H  . tamsulosin  0.4 mg Oral QPC supper   Continuous Infusions: . sodium chloride 75 mL/hr at 01/30/16 0815     LOS: 0 days    Time spent: 62min    Domenic Polite, MD [facility] Pager [phone]  If  7PM-7AM, please contact night-coverage [email or web address] Password [email or web address] 01/30/2016, 11:52 AM

## 2016-01-30 NOTE — Progress Notes (Signed)
Initial Nutrition Assessment  DOCUMENTATION CODES:  Not applicable (But at high risk for developing malnutrition)  INTERVENTION:  Ensure Enlive po BID, each supplement provides 350 kcal and 20 grams of protein  Do not foresee pt being able to maintain nourishment/hydration at home with current level of cofusion  NUTRITION DIAGNOSIS:  Predicted suboptimal nutrient intake related to lethargy/confusion (dementia) as evidenced by Natural disease course of dementia.  GOAL:  Patient will meet greater than or equal to 90% of their needs  MONITOR:  PO intake, Supplement acceptance, Labs  REASON FOR ASSESSMENT:  Consult "malnutrition"  ASSESSMENT:  82 y/o male PMHx HTN, HLD, GERD, CKD 3, A fib, dementia w/ frequent falls. Has had increasing confusion last 5-6 months. Presented to ED with eye swelling, confusion, abdominal pain, dysuria, headache. Worked up for acute on chronic renal failure, dehydration, failure to thrive.   RD woke pt up on arrival. His breakfast tray next to him is seen ~75% consumed. He says he is eating well. RD specifically asked about fluids, replies "yah I drink water, juice tea etc'. RD asked what month it was and correctly stated "September", but when asked what year he just said "90".   There are no other historians presents. RD asked if there were any foods he would like to eat.   Many of his responses to RD questions were garbled and incomprehensible. He kept stating he is healthy and is eager to go home. As already noted by other disciplines, do not expect he can maintain his nourishment/hydration at home with current confusion.   RD asked his UBW and he said 157 lbs. Per chart review, his recent wt hx appears largely stable.  NFPE: has moderate wasting of musculature in clavicle region and mild-moderate fat wasting of triceps, but overall WDL. He may simply have more pronounced collar bone.   Medications: Several BP meds, senna Labs reviewed: Albumin 3.1,  WBC: 3.1  Recent Labs Lab 01/29/16 1916 01/30/16 0540  NA 138 142  K 4.4 4.4  CL 112* 113*  CO2 20* 21*  BUN 26* 24*  CREATININE 3.22* 2.97*  CALCIUM 8.2* 8.3*  MG  --  2.0  PHOS  --  3.9  GLUCOSE 92 88   Diet Order:  Diet Heart Room service appropriate? Yes; Fluid consistency: Thin Diet Heart Room service appropriate? Yes; Fluid consistency: Thin  Skin:  Reviewed, no issues  Last BM:  Unknown  Height:  Ht Readings from Last 1 Encounters:  01/29/16 5\' 10"  (1.778 m)   Weight:  Wt Readings from Last 1 Encounters:  01/29/16 156 lb (70.8 kg)   Wt Readings from Last 10 Encounters:  01/29/16 156 lb (70.8 kg)  01/19/16 156 lb 9.6 oz (71 kg)  01/11/16 150 lb (68 kg)  04/03/15 160 lb 3.2 oz (72.7 kg)  08/11/14 158 lb 15.2 oz (72.1 kg)  04/05/14 153 lb 10.6 oz (69.7 kg)  07/25/13 160 lb (72.6 kg)  12/11/12 170 lb (77.1 kg)  05/05/09 183 lb (83 kg)  04/29/09 183 lb 8 oz (83.2 kg)   Ideal Body Weight:  75.45 kg  BMI:  Body mass index is 22.38 kg/m.  Estimated Nutritional Needs:  Kcal:  1750-2000 kcals (25-28 kcal/kg bw) Protein:  70-85 g (1-1.2 g/kg BW) Fluid:  >1.8 L (25 ml/kg bw)  EDUCATION NEEDS:  No education needs identified at this time  Burtis Junes RD, LDN, [place] [facility] Pager: [phone] 01/30/2016 10:52 AM

## 2016-01-30 NOTE — Plan of Care (Signed)
Physical Therapy Evaluation Patient Details Name: Wayne Fuller MRN: [id number] DOB: 1923/07/16 Today's Date: 01/30/2016   History of Present Illness  Patient presents with confusion, left eye pain, HTN, and anemia. He had HTN this morning but B/P was measured at 169/59 prior to therapy.   Clinical Impression  Patient presents with decreased balance, endurance, and safety awareness. He is a high fall risk. He reports he lives alone but previous notes state he may have some family support. He would require 24 hour assistance at home. He may benefit from skilled rehab at a SNF with progression to an ALF unless support can be provided. He would benefit from further therapy.     Follow Up Recommendations SNF    Equipment Recommendations       Recommendations for Other Services Other (comment) (rehab at a SNF )     Precautions / Restrictions Precautions Precautions: Fall Restrictions Weight Bearing Restrictions: No      Mobility  Bed Mobility Overal bed mobility: Independent;Needs Assistance Bed Mobility: Supine to Sit     Supine to sit: Supervision     General bed mobility comments: supervision for safety   Transfers Overall transfer level: Needs assistance Equipment used: Rolling walker (2 wheeled) Transfers: Sit to/from Stand Sit to Stand: Min assist         General transfer comment: Min a for balance   Ambulation/Gait Ambulation/Gait assistance: Min assist Ambulation Distance (Feet): 60 Feet Assistive device: Rolling walker (2 wheeled) Gait Pattern/deviations:  (decreased balance with turns. Mina for balance )        Financial trader Rankin (Stroke Patients Only)       Balance Overall balance assessment: Independent         Standing balance support: Bilateral upper extremity supported Standing balance-Leahy Scale: Poor Standing balance comment: Needs assistance to stand and with turns for balance                               Pertinent Vitals/Pain Pain Assessment: No/denies pain    Home Living Family/patient expects to be discharged to:: Private residence                 Additional Comments: Patient is confused at this time. He reports he lives alone. Previous visit reports that he has family and advanced hiome care comes out 3x per week.     Prior Function Level of Independence: Independent         Comments: Patient reports he was independent with a walker. He is confused at this time though      Hand Dominance   Dominant Hand: Right    Extremity/Trunk Assessment   Upper Extremity Assessment: Overall WFL for tasks assessed           Lower Extremity Assessment: Generalized weakness         Communication   Communication: No difficulties  Cognition Arousal/Alertness: Awake/alert                          General Comments      Exercises        Assessment/Plan    PT Assessment Patient needs continued PT services  PT Diagnosis Difficulty walking;Abnormality of gait;Generalized weakness;Acute pain;Altered mental status   PT Problem List Decreased strength;Decreased activity tolerance;Decreased balance;Decreased mobility;Decreased coordination;Decreased cognition;Decreased knowledge of  precautions;Decreased safety awareness  PT Treatment Interventions Gait training;Functional mobility training;Therapeutic activities;Therapeutic exercise;Balance training;Neuromuscular re-education;Patient/family education;Manual techniques   PT Goals (Current goals can be found in the Care Plan section) Acute Rehab PT Goals Patient Stated Goal: to go home  PT Goal Formulation: With patient Time For Goal Achievement: 02/20/16 Potential to Achieve Goals: Fair    Frequency Min 3X/week   Barriers to discharge Decreased caregiver support Patient lives at home. Per patient he lives alone. He is a high fall risk.     Co-evaluation                End of Session Equipment Utilized During Treatment: Gait belt Activity Tolerance: Patient limited by fatigue Patient left: in bed;with bed alarm set;with nursing/sitter in room (chest x-ray taking patient down after therapy ) Nurse Communication: Mobility status;Precautions         Time: 0223-3612 PT Time Calculation (min) (ACUTE ONLY): 25 min   Charges:   PT Evaluation $PT Eval Low Complexity: 1 Procedure     PT G Codes:        Carney Living PT DPT  01/30/2016, 1:43 PM

## 2016-01-30 NOTE — Progress Notes (Signed)
While giving patient his medication, he got choked on the water.  Daughter, who was at bedside stated, "he's been doing that a lot lately."  I did bedside swallow on patient, and he passed with no problem.

## 2016-01-30 NOTE — H&P (Addendum)
[name] Wayne Fuller:[id number] DOB: [birth year] DOA: 01/29/2016     PCP: [name], NP   Outpatient Specialists: Dr Einar Gip cardiologist  Patient coming from:   home Lives alone,     Chief Complaint: Left eye pain and swelling  HPI: Wayne Fuller is a 82 y.o. male with medical history significant of herniated disc on lumbar and cervical spine, dementia,   A. Fib, HTN, CKD    Presented with Confusion frequent falls, eye swelling confusion has been ongoing and gradually worsening eye pain started more recently was severe involving the left eye is swelling to the upper and lower needs associated headache patient also been endorsing of abdominal pain and dysuria. He was seen by home health nurse was concerned family brought patient to [facility]. Patient has been feeling progressively worsening memory loss many months now ever since January. He lives alone since his wife passed away and family was concerned for his care. She had been [facility] secondary to dehydration resulting in falls. He has undergone extensive dementia workup.   Regarding pertinent Chronic problems: Has hx of a. Fib  On amiodarone and Eliquis.  Patient history of dementia with danger to himself with admission to emergency department secondary to falls and failure to thrive associated dehydration M he has been working on placement. History of hypertension followed by cardiology patient has chronic kidney disease if continually worsening renal function in February his cranium was 1.7 in August was 2.5 currently up to 3.2 Vision has leukopenia for the past 1 year as well as anemia which been stable around hemoglobin of 10-11  IN ER:  Temp (24hrs), Avg:98.3 F (36.8 C), Min:98.3 F (36.8 C), Max:98.3 F (36.8 C)  WBC 2.9 absolute neutrophil, 1.9 hemoglobin 9.8  CT head unremarkable  Left eye noted to be morning check that today what I drainage pupils poorly reactive pressure  measures was 28 in the emergency department no staining on  Woods lamp exam noted Following Medications were ordered in ER: Medications  bacitracin-polymyxin b (POLYSPORIN) ophthalmic ointment (not administered)  fluorescein ophthalmic strip 1 strip (1 strip Left Eye Given 01/29/16 1915)  tetracaine (PONTOCAINE) 0.5 % ophthalmic solution 2 drop (2 drops Left Eye Given 01/29/16 1914)  sodium chloride 0.9 % bolus 500 mL (0 mLs Intravenous Stopped 01/29/16 2113)  hydrALAZINE (APRESOLINE) tablet 50 mg (50 mg Oral Given 01/29/16 2048)   ER discuss case with Dr. Schuyler Amor with ophthalmology who advised to start patient on antibacterial ointment and see patient in consult Hospitalist was called for admission for   dehydration and failure to thrive with acute on chronic renal failure  Review of Systems:    Pertinent positives include:  fatigue, weight loss confusion, back pain frequent falls  Constitutional:  No weight loss, night sweats, Fevers, chills, HEENT:  No headaches, Difficulty swallowing,Tooth/dental problems,Sore throat,  No sneezing, itching, ear ache, nasal congestion, post nasal drip,  Cardio-vascular:  No chest pain, Orthopnea, PND, anasarca, dizziness, palpitations.no Bilateral lower extremity swelling  GI:  No heartburn, indigestion, abdominal pain, nausea, vomiting, diarrhea, change in bowel habits, loss of appetite, melena, blood in stool, hematemesis Resp:  no shortness of breath at rest. No dyspnea on exertion, No excess mucus, no productive cough, No non-productive cough, No coughing up of blood.No change in color of mucus.No wheezing. Skin:  no rash or lesions. No jaundice GU:  no dysuria, change in color of urine, no urgency or frequency. No straining to urinate.  No flank pain.  Musculoskeletal:  No joint pain or no joint swelling. No decreased range of motion. No back pain.  Psych:  No change in mood or affect. No depression or anxiety. No memory loss.  Neuro: no localizing  neurological complaints, no tingling, no weakness, no double vision, no gait abnormality, no slurred speech, no   As per HPI otherwise 10 point review of systems negative.   Past Medical History: Past Medical History:  Diagnosis Date  . BPH (benign prostatic hyperplasia)   . Chronic kidney disease    ckd stage 3  . Dementia   . GERD (gastroesophageal reflux disease)   . Hemorrhoids   . History of angina   . Hyperlipemia   . Hypertension   . Irregular heart rhythm   . Paroxysmal a-fib ([place]) 04/02/2015   Past Surgical History:  Procedure Laterality Date  . BACK SURGERY    . CERVICAL SPINE SURGERY    . SPINE SURGERY       Social History:  Ambulatory  independently but with frequent falls     reports that he has quit smoking. His smoking use included Cigarettes. He has a 5.00 pack-year smoking history. He has never used smokeless tobacco. He reports that he does not drink alcohol or use drugs.  Allergies:  No Known Allergies     Family History:   Family History  Problem Relation Age of Onset  . Stroke Mother   . Hypertension Sister   . Other Son     Homicide, stabbed  . Stroke Son   . Hypertension Son   . Diabetes Son   . Hypertension Son     Medications: Prior to Admission medications   Medication Sig Start Date End Date Taking? Authorizing Provider  amiodarone (PACERONE) 200 MG tablet Take 200 mg by mouth daily.    Historical Provider, MD  apixaban (ELIQUIS) 2.5 MG TABS tablet Take 2.5 mg by mouth 2 (two) times daily.    Historical Provider, MD  Cholecalciferol (VITAMIN D3) 5000 units CAPS Take 1 capsule (5,000 Units total) by mouth daily. 01/19/16   [name], NP  diltiazem (CARDIZEM CD) 240 MG 24 hr capsule Take 240 mg by mouth daily. Will obtain today from pharmacy    Historical Provider, MD  hydrALAZINE (APRESOLINE) 50 MG tablet Take 50 mg by mouth 3 (three) times daily.     Historical Provider, MD  metoprolol succinate (TOPROL-XL) 50 MG 24 hr  tablet Take 50 mg by mouth daily. 02/18/15   Historical Provider, MD  tamsulosin (FLOMAX) 0.4 MG CAPS capsule Take 1 capsule (0.4 mg total) by mouth daily after supper. 01/19/16   [name], NP    Physical Exam: Patient Vitals for the past 24 hrs:  BP Temp Temp src Pulse Resp SpO2 Height Weight  01/29/16 2303 166/78 - - 70 18 100 % - -  01/29/16 2230 187/68 - - (!) 123 19 100 % - -  01/29/16 2200 162/57 - - 71 19 100 % - -  01/29/16 2131 179/76 - - 74 26 97 % - -  01/29/16 2030 (!) 206/94 - - 70 15 100 % - -  01/29/16 2014 (!) 222/82 - - 65 21 100 % - -  01/29/16 1934 (!) 226/98 - - 64 18 99 % - -  01/29/16 1800 192/82 - - 60 18 100 % - -  01/29/16 1716 - - - - - - 5\' 10"  (1.778 m) 70.8 kg (156 lb)  01/29/16 1715 187/74 98.3 F (36.8 C) Oral 66 18 100 % - -    1. General:  in No Acute distress 2. Psychological: Alert but not Oriented 3. Head/ENT:     Dry Mucous Membranes                          Head Non traumatic, neck supple                            Poor Dentition                            Eye exam significant for left swollen eyelids with injected conjunctiva 4. SKIN:   decreased Skin turgor,  Skin clean Dry and intact no rash 5. Heart: Regular rate and rhythm no  Murmur, Rub or gallop 6. Lungs:  Clear to auscultation bilaterally, no wheezes or crackles   7. Abdomen: Soft,  non-tender, Non distended 8. Lower extremities: no clubbing, cyanosis, or edema 9. Neurologically Grossly intact, moving all 4 extremities equally  10. MSK: Normal range of motion   body mass index is 22.38 kg/m.  Labs on Admission:   Labs on Admission: I have personally reviewed following labs and imaging studies  CBC:  Recent Labs Lab 01/29/16 1916  WBC 2.9*  NEUTROABS 1.9  HGB 9.8*  HCT 30.6*  MCV 99.7  PLT 696   Basic Metabolic Panel:  Recent Labs Lab 01/29/16 1916  NA 138  K 4.4  CL 112*  CO2 20*  GLUCOSE 92  BUN 26*  CREATININE 3.22*  CALCIUM 8.2*    GFR: Estimated Creatinine Clearance: 15.3 mL/min (by C-G formula based on SCr of 3.22 mg/dL). Liver Function Tests: No results for input(s): AST, ALT, ALKPHOS, BILITOT, PROT, ALBUMIN in the last 168 hours. No results for input(s): LIPASE, AMYLASE in the last 168 hours. No results for input(s): AMMONIA in the last 168 hours. Coagulation Profile: No results for input(s): INR, PROTIME in the last 168 hours. Cardiac Enzymes: No results for input(s): CKTOTAL, CKMB, CKMBINDEX, TROPONINI in the last 168 hours. BNP (last 3 results) No results for input(s): PROBNP in the last 8760 hours. HbA1C: No results for input(s): HGBA1C in the last 72 hours. CBG: No results for input(s): GLUCAP in the last 168 hours. Lipid Profile: No results for input(s): CHOL, HDL, LDLCALC, TRIG, CHOLHDL, LDLDIRECT in the last 72 hours. Thyroid Function Tests: No results for input(s): TSH, T4TOTAL, FREET4, T3FREE, THYROIDAB in the last 72 hours. Anemia Panel: No results for input(s): VITAMINB12, FOLATE, FERRITIN, TIBC, IRON, RETICCTPCT in the last 72 hours. Urine analysis:    Component Value Date/Time   COLORURINE YELLOW 01/29/2016 2040   APPEARANCEUR CLEAR 01/29/2016 2040   LABSPEC 1.010 01/29/2016 2040   PHURINE 5.5 01/29/2016 2040   GLUCOSEU NEGATIVE 01/29/2016 2040   HGBUR NEGATIVE 01/29/2016 2040   BILIRUBINUR NEGATIVE 01/29/2016 2040   KETONESUR NEGATIVE 01/29/2016 2040   PROTEINUR NEGATIVE 01/29/2016 2040   UROBILINOGEN 0.2 04/02/2015 1430   NITRITE NEGATIVE 01/29/2016 2040   LEUKOCYTESUR NEGATIVE 01/29/2016 2040   Sepsis Labs: @LABRCNTIP (procalcitonin:4,lacticidven:4) )No results found for this or any previous visit (from the past 240 hour(s)).     UA   no evidence of UTI   No results found for: HGBA1C  Estimated Creatinine Clearance: 15.3 mL/min (by C-G formula based on SCr of 3.22 mg/dL).  BNP (last  3 results) No results for input(s): PROBNP in the last 8760 hours.   ECG REPORT   Independently reviewed Rate: 63  Rhythm: Sinus rhythm ST&T Change: No acute ischemic changes   QTC 445  Filed Weights   01/29/16 1716  Weight: 70.8 kg (156 lb)     Cultures:    Component Value Date/Time   SDES URINE, RANDOM 04/02/2015 1430   SPECREQUEST NONE 04/02/2015 1430   CULT NO GROWTH 1 DAY 04/02/2015 1430   REPTSTATUS 04/03/2015 FINAL 04/02/2015 1430     Radiological Exams on Admission: Ct Head Wo Contrast  Result Date: 01/29/2016 CLINICAL DATA:  Fall, weakness, confusion EXAM: CT HEAD WITHOUT CONTRAST TECHNIQUE: Contiguous axial images were obtained from the base of the skull through the vertex without intravenous contrast. COMPARISON:  01/11/2016 FINDINGS: Brain: No intracranial hemorrhage, mass effect or midline shift. Mild cerebral atrophy again noted. Stable chronic white matter disease. No definite acute cortical infarction. No mass lesion is noted on this unenhanced scan. Vascular: No hyperdense vessel or unexpected calcification. Skull: Normal. Negative for fracture or focal lesion. Sinuses/Orbits: No acute finding. Other: None. IMPRESSION: No acute intracranial abnormality. Stable atrophy and chronic white matter disease. No definite acute cortical infarction. Electronically Signed   By: Lahoma Crocker M.D.   On: 01/29/2016 18:58    Chart has been reviewed    Assessment/Plan  82 y.o. male with medical history significant of herniated disc on lumbar and cervical spine, dementia,   A. Fib, HTN, CKD , CAD admitted for failure to thrive, dehydration, acute on chronic renal failure and left eye conjunctivitis  Present on Admission: . Acute encephalopathy in the setting of dehydration and worsening renal failure will rehydrate and follow mental status . Atrial fibrillation ([place]) - given patient's frequent falls advanced age and multiple medical problems with severe dementia with discussed with family discontinuation of anticoagulation given risk of bleeding from falls.  Family agrees to stop anticoagulation.   Marland Kitchen CAD (coronary artery disease) stable continue home medications . CKD (chronic kidney disease) stage 3, GFR 30-59 ml/min gradually worsening creatinine will give fluid resuscitation obtain renal ultrasound check urine electrolytes . Dementia with behavioral disturbance progresses expect some degree of sundowning continue to monitor Debility - PT OT eval . Essential hypertension vitals  signs stable continue metoprolol and hydralazine . Leukocytopenia -there's been ongoing for the past 1 year. Defer to outpatient workup if family chooses to do so with oncology hematology Left eye conjunctivitis - will continue antibiotics and defer to opthalmology Anemia -will obtain anemia panel in am  Other plan as per orders.  DVT prophylaxis:  SCD      Code Status:    DNR/DNI as per  family   Family Communication:   Family   at  Bedside  plan of care was discussed with Daughter  Disposition Plan:     likely will need placement for rehabilitation                      Would benefit from PT/OT eval prior to DC ordered                     Nutrition   consulted                          Consults called: ER provided discuss case with ophthalmology    Admission status:   obs   Level of care  tele        I have spent a total of 57 min on this admission  Barrington Worley 01/30/2016, 1:23 AM    [facility]  Pager [phone]   after 2 AM please page floor coverage PA If 7AM-7PM, please contact the day team taking care of the patient  [email or web address]  Password [email or web address]

## 2016-01-31 LAB — BASIC METABOLIC PANEL
Anion gap: 4 — ABNORMAL LOW (ref 5–15)
BUN: 24 mg/dL — ABNORMAL HIGH (ref 6–20)
CO2: 20 mmol/L — ABNORMAL LOW (ref 22–32)
Calcium: 8 mg/dL — ABNORMAL LOW (ref 8.9–10.3)
Chloride: 116 mmol/L — ABNORMAL HIGH (ref 101–111)
Creatinine, Ser: 2.91 mg/dL — ABNORMAL HIGH (ref 0.61–1.24)
GFR calc Af Amer: 20 mL/min — ABNORMAL LOW (ref >60)
GFR calc non Af Amer: 18 mL/min — ABNORMAL LOW (ref >60)
Glucose, Bld: 128 mg/dL — ABNORMAL HIGH (ref 65–99)
Potassium: 3.9 mmol/L (ref 3.5–5.1)
Sodium: 140 mmol/L (ref 135–145)

## 2016-01-31 LAB — CBC
HCT: 27.9 % — ABNORMAL LOW (ref 39.0–52.0)
Hemoglobin: 8.6 g/dL — ABNORMAL LOW (ref 13.0–17.0)
MCH: 30.7 pg (ref 26.0–34.0)
MCHC: 30.8 g/dL (ref 30.0–36.0)
MCV: 99.6 fL (ref 78.0–100.0)
Platelets: 148 10*3/uL — ABNORMAL LOW (ref 150–400)
RBC: 2.8 MIL/uL — ABNORMAL LOW (ref 4.22–5.81)
RDW: 14.3 % (ref 11.5–15.5)
WBC: 2.6 10*3/uL — ABNORMAL LOW (ref 4.0–10.5)

## 2016-01-31 MED ORDER — LORAZEPAM 2 MG/ML IJ SOLN
INTRAMUSCULAR | Status: AC
  Administered 2016-01-31: 1 mg
  Filled 2016-01-31: qty 1

## 2016-01-31 MED ORDER — HYDRALAZINE HCL 20 MG/ML IJ SOLN
10.0000 mg | Freq: Four times a day (QID) | INTRAMUSCULAR | Status: DC | PRN
  Administered 2016-01-31: 10 mg via INTRAVENOUS
  Filled 2016-01-31: qty 1

## 2016-01-31 MED ORDER — LORAZEPAM 2 MG/ML IJ SOLN
0.5000 mg | Freq: Once | INTRAMUSCULAR | Status: AC
  Administered 2016-01-31: 0.5 mg via INTRAVENOUS
  Filled 2016-01-31: qty 1

## 2016-01-31 MED ORDER — LORAZEPAM 2 MG/ML IJ SOLN
1.0000 mg | Freq: Four times a day (QID) | INTRAMUSCULAR | Status: DC | PRN
  Administered 2016-01-31: 1 mg via INTRAVENOUS
  Filled 2016-01-31: qty 1

## 2016-01-31 NOTE — Evaluation (Signed)
Clinical/Bedside Swallow Evaluation Patient Details  Name: Wayne Fuller MRN: [id number] Date of Birth: 01-05-1924  Today's Date: 01/31/2016 Time: SLP Start Time (ACUTE ONLY): 6073 SLP Stop Time (ACUTE ONLY): 0945 SLP Time Calculation (min) (ACUTE ONLY): 20 min  Past Medical History:  Past Medical History:  Diagnosis Date  . BPH (benign prostatic hyperplasia)   . Chronic kidney disease    ckd stage 3  . Dementia   . GERD (gastroesophageal reflux disease)   . Hemorrhoids   . History of angina   . Hyperlipemia   . Hypertension   . Irregular heart rhythm   . Paroxysmal a-fib ([place]) 04/02/2015   Past Surgical History:  Past Surgical History:  Procedure Laterality Date  . BACK SURGERY    . CERVICAL SPINE SURGERY    . SPINE SURGERY     HPI:  Pt is a 82 y.o. 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 Fib, HTN, CKD, admitted to ED 9/8 with gradually worsening confusion, frequent falls, eye swelling and eye pain, dehydration and worsening renal failure. Head CT showed nothing acute. Bedside swallow eval ordered.    Assessment / Plan / Recommendation Clinical Impression  Pt lying in bed sleeping upon entering room. Son was at the bedside and shared patients history of impulsivity, confusion and dementia. Pt was in restraint across his abdomen. Son reported pt dressed himself and wanted to leave the hospital last evening and that was the need for restraints. Pt was woken and given trials of water, applesauce and cracker. Pt did not exhibit any signs or symptoms of aspiration with any consistency or presentation. Pt's respiratory status is stable and no spike in fever. Recommend patient to continue current diet of Heart Healthy with thin liquids. Medication can be given in whole in puree.     Aspiration Risk  No limitations    Diet Recommendation Regular;Thin liquid   Liquid Administration via: Cup;Straw Medication Administration: Whole meds with  puree Supervision: Staff to assist with self feeding Compensations: Minimize environmental distractions Postural Changes: Seated upright at 90 degrees    Other  Recommendations Oral Care Recommendations: Oral care BID   Follow up Recommendations  24 hour supervision/assistance    Frequency and Duration            Prognosis        Swallow Study   General Date of Onset: 01/29/16 HPI: Pt is a 82 y.o. 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 Fib, HTN, CKD, admitted to ED 9/8 with gradually worsening confusion, frequent falls, eye swelling and eye pain, dehydration and worsening renal failure. Head CT showed nothing acute. Bedside swallow eval ordered.  Type of Study: Bedside Swallow Evaluation Previous Swallow Assessment: none in chart Diet Prior to this Study: Regular;Thin liquids Temperature Spikes Noted: No Respiratory Status: Room air History of Recent Intubation: No Behavior/Cognition: Lethargic/Drowsy Oral Cavity Assessment: Within Functional Limits Oral Care Completed by SLP: No Oral Cavity - Dentition: Dentures, top;Dentures, bottom Vision: Functional for self-feeding Self-Feeding Abilities: Able to feed self Patient Positioning: Upright in bed Baseline Vocal Quality: Normal Volitional Cough: Strong Volitional Swallow: Able to elicit    Oral/Motor/Sensory Function Overall Oral Motor/Sensory Function: Within functional limits   Ice Chips Ice chips: Within functional limits Presentation: Spoon   Thin Liquid Thin Liquid: Within functional limits Presentation: Cup;Straw    Nectar Thick Nectar Thick Liquid: Not tested   Honey Thick Honey Thick Liquid: Not tested   Puree  Puree: Within functional limits Presentation: Self Fed   Solid   GO   Solid: Within functional limits Presentation: Self Fed    Functional Assessment Tool Used: Bedside Swallow evaluation Functional Limitations: Swallowing Swallow Current Status (N8177): 0  percent impaired, limited or restricted Swallow Goal Status (N1657): 0 percent impaired, limited or restricted Swallow Discharge Status [phone]): 0 percent impaired, limited or restricted   Charlynne Cousins Ward, MA, CCC-SLP 01/31/2016 10:04 AM

## 2016-01-31 NOTE — Progress Notes (Signed)
PROGRESS NOTE    Wayne Fuller  BJY:[id number] DOB: 1923/08/07 DOA: 01/29/2016 PCP: [name], NP  Brief Narrative: Wayne Fuller is a 82 y.o. male with medical history significant ofherniated disc on lumbar and cervical spine, dementia,   A. Fib, HTN, CKD Presented with Confusion frequent falls, eye swelling confusion has been ongoing and gradually worsening eye pain. He was seen by home health nurse was concerned family brought patient to [facility]. Patient has been feeling progressively worsening memory loss many months now ever since January. He lives alone since his wife passed away and family was concerned for his care. [facility] had [facility] to [facility] resulting in falls  Assessment & Plan:  1. Metabolic encephalopathy -due to dehydration and worsening renal failure, in background of worsening dementia -continue IVF, monitor -avoid sedating meds -CT head unremarkable, UA and CXR ok  2. Advanced Dementia -s/p SLP eval-mild aspiration risk-regular diet recommended for now -PT/OT consulted, SNF recommended -needs to live in a supervised environment, not safe to live alone  3. AKi on CKD3 -baseline creatinine 2.4 -hydrate and monitor, 2.9 today  4. P.Atrial fibrillation (HCC) -due to advanced age, progressive dementia and frequent falls, Dr.Doutova discussed risks/benefits of anticoagulation with family and decision was made to STop this in agreement with family - rate controlled, continue amiodarone, metoprolol and diltiazem  5. CAD (coronary artery disease)  -stable continue home medications  6.  Essential hypertension  -BP trending up, increase hydralazine, continue metoprolol, add norvasc  7.  Leukopenia -chronic, for > 1 year, defer to PCP  8. Left eye conjunctivitis -  -continue abx eye drops as recommended by Dr.Groat yesterday  DVT prophylaxis:  add heparin SQ      Code Status:    DNR/DNI Family  Communication:  No family at bedside, tried to reach son, was unable to leave a msg Disposition Plan:    will need placement     Consultants:     Procedures:    Subjective: desparately trying to leave and go home this am  Objective: Vitals:   01/30/16 1726 01/30/16 2055 01/31/16 0407 01/31/16 1122  BP: (!) 152/59 (!) 155/53 (!) 188/80   Pulse: (!) 50 (!) 59 61   Resp: 16 20 19    Temp: 98.1 F (36.7 C) 97.2 F (36.2 C) 98.2 F (36.8 C)   TempSrc:      SpO2: 100% 99% 100% 96%  Weight:      Height:        Intake/Output Summary (Last 24 hours) at 01/31/16 1138 Last data filed at 01/31/16 0600  Gross per 24 hour  Intake             1500 ml  Output              250 ml  Net             1250 ml   Filed Weights   01/29/16 1716 01/29/16 2200  Weight: 70.8 kg (156 lb) 70.8 kg (156 lb)    Examination:  General exam: Appears calm and comfortable but pleasantly confused Respiratory system: diminished bs at bases Cardiovascular system: S1 & S2 heard, RRR. No JVD, murmurs, rubs, gallops or clicks. No pedal edema. Gastrointestinal system: Abdomen is nondistended, soft and nontender.  Normal bowel sounds heard. Central nervous system: Alert, confused, No gross  focal neurological deficits. Extremities: Symmetric 5 x 5 power. Skin: No rashes, lesions or ulcers Psychiatry: confused  Data Reviewed: I have personally reviewed following labs and imaging studies  CBC:  Recent Labs Lab 01/29/16 1916 01/30/16 0540 01/31/16 0906  WBC 2.9* 3.1* 2.6*  NEUTROABS 1.9  --   --   HGB 9.8* 9.5* 8.6*  HCT 30.6* 30.2* 27.9*  MCV 99.7 97.7 99.6  PLT 168 163 563*   Basic Metabolic Panel:  Recent Labs Lab 01/29/16 1916 01/30/16 0540 01/31/16 0906  NA 138 142 140  K 4.4 4.4 3.9  CL 112* 113* 116*  CO2 20* 21* 20*  GLUCOSE 92 88 128*  BUN 26* 24* 24*  CREATININE 3.22* 2.97* 2.91*  CALCIUM 8.2* 8.3* 8.0*  MG  --  2.0  --   PHOS  --  3.9  --    GFR: Estimated Creatinine  Clearance: 16.9 mL/min (by C-G formula based on SCr of 2.91 mg/dL). Liver Function Tests:  Recent Labs Lab 01/30/16 0540  AST 16  ALT 12*  ALKPHOS 57  BILITOT 0.3  PROT 5.7*  ALBUMIN 3.1*   No results for input(s): LIPASE, AMYLASE in the last 168 hours. No results for input(s): AMMONIA in the last 168 hours. Coagulation Profile: No results for input(s): INR, PROTIME in the last 168 hours. Cardiac Enzymes: No results for input(s): CKTOTAL, CKMB, CKMBINDEX, TROPONINI in the last 168 hours. BNP (last 3 results) No results for input(s): PROBNP in the last 8760 hours. HbA1C: No results for input(s): HGBA1C in the last 72 hours. CBG: No results for input(s): GLUCAP in the last 168 hours. Lipid Profile: No results for input(s): CHOL, HDL, LDLCALC, TRIG, CHOLHDL, LDLDIRECT in the last 72 hours. Thyroid Function Tests:  Recent Labs  01/30/16 0540  TSH 2.280   Anemia Panel:  Recent Labs  01/30/16 0540  VITAMINB12 208  FOLATE 6.5  FERRITIN 65  TIBC 246*  IRON 55  RETICCTPCT 1.1   Urine analysis:    Component Value Date/Time   COLORURINE YELLOW 01/29/2016 2040   APPEARANCEUR CLEAR 01/29/2016 2040   LABSPEC 1.010 01/29/2016 2040   PHURINE 5.5 01/29/2016 2040   GLUCOSEU NEGATIVE 01/29/2016 2040   HGBUR NEGATIVE 01/29/2016 2040   BILIRUBINUR NEGATIVE 01/29/2016 2040   KETONESUR NEGATIVE 01/29/2016 2040   PROTEINUR NEGATIVE 01/29/2016 2040   UROBILINOGEN 0.2 04/02/2015 1430   NITRITE NEGATIVE 01/29/2016 2040   LEUKOCYTESUR NEGATIVE 01/29/2016 2040   Sepsis Labs: @LABRCNTIP (procalcitonin:4,lacticidven:4)  )No results found for this or any previous visit (from the past 240 hour(s)).       Radiology Studies: Dg Chest 2 View  Result Date: 01/30/2016 CLINICAL DATA:  Patient has developed a cough. History of hypertension EXAM: CHEST  2 VIEW COMPARISON:  01/02/2016. FINDINGS: PA and lateral views of the chest demonstrate no focal pulmonary infiltrate, consolidation,  or pleural effusion. Faint nodular opacity in the left mid to lower lung zone similar compared to prior and likely reflects a nipple shadow. Cardiomediastinal silhouette is stable. Mild atherosclerosis of the aorta. No pneumothorax. There are degenerative osteophytes of the spine. There is partially visualized cervical fusion hardware to the level of T1, similar compared to previous exam. There is left AC joint degenerative change. IMPRESSION: No acute infiltrate or edema. Mild atherosclerotic vascular disease of the aorta Electronically Signed   By: Donavan Foil M.D.   On: 01/30/2016 15:54   Ct Head Wo Contrast  Result Date: 01/29/2016 CLINICAL DATA:  Fall, weakness, confusion EXAM: CT HEAD WITHOUT CONTRAST TECHNIQUE: Contiguous axial images were obtained from the base of the skull through the vertex  without intravenous contrast. COMPARISON:  01/11/2016 FINDINGS: Brain: No intracranial hemorrhage, mass effect or midline shift. Mild cerebral atrophy again noted. Stable chronic white matter disease. No definite acute cortical infarction. No mass lesion is noted on this unenhanced scan. Vascular: No hyperdense vessel or unexpected calcification. Skull: Normal. Negative for fracture or focal lesion. Sinuses/Orbits: No acute finding. Other: None. IMPRESSION: No acute intracranial abnormality. Stable atrophy and chronic white matter disease. No definite acute cortical infarction. Electronically Signed   By: Lahoma Crocker M.D.   On: 01/29/2016 18:58   US Renal  Result Date: 01/30/2016 CLINICAL DATA:  Acute on chronic renal failure EXAM: RENAL / URINARY TRACT ULTRASOUND COMPLETE COMPARISON:  CT scan 01/11/2016 FINDINGS: Right Kidney: Length: 8.9 cm in length. No hydronephrosis. There is diffuse increased echogenicity probable due to chronic medical renal disease. A cyst in midpole measures 1.3 x 1.4 cm. Cyst in lower pole measures 1.4 cm. Left Kidney: Length: 9.7 cm in length. There is diffuse increased echogenicity  probable chronic medical renal disease. There is a cyst in midpole measures to by 2.3 cm. A cyst in lower pole measures 2 x 1.8 cm. A second cyst in midpole measures 1.8 cm. Bladder: Appears normal for degree of bladder distention. IMPRESSION: 1. No hydronephrosis. Bilateral renal cortical increased echogenicity probable medical renal disease. Bilateral renal cysts. Unremarkable urinary bladder. Electronically Signed   By: Lahoma Crocker M.D.   On: 01/30/2016 11:28        Scheduled Meds: . amiodarone  200 mg Oral Daily  . amLODipine  10 mg Oral Daily  . bacitracin-polymyxin b   Left Eye QID  . diltiazem  240 mg Oral Daily  . feeding supplement (ENSURE ENLIVE)  237 mL Oral BID BM  . heparin subcutaneous  5,000 Units Subcutaneous Q8H  . hydrALAZINE  75 mg Oral TID  . metoprolol tartrate  25 mg Oral BID  . senna  1 tablet Oral BID  . sodium chloride flush  3 mL Intravenous Q12H  . tamsulosin  0.4 mg Oral QPC supper   Continuous Infusions: . sodium chloride 1,000 mL (01/31/16 0643)     LOS: 1 day    Time spent: 44min    Domenic Polite, MD [facility] Pager [phone]  If 7PM-7AM, please contact night-coverage [email or web address] Password [email or web address] 01/31/2016, 11:38 AM

## 2016-01-31 NOTE — Progress Notes (Addendum)
Pt very confused this am/agitated. Pt putting clothes on and wanting to go home. Pt not cooperating with staff. X1 dose Ativan given. Son Fritz Pickerel) called on phone to talk with pt. Will continue to monitor pt.   1000 pt continuously getting OOB and is very unsteady. Pts safety is the concern. Dr Broadus John ordered restraints to keep pt safe. Will apply and continue to monitor and re-assess pt.

## 2016-01-31 NOTE — Progress Notes (Signed)
Patient has been trying to get up. He is a high fall risk.  Appeared to be more agitated this am. Paged on call and got an order for 0.5mg  IV ativan. Called pharmacy and asked them to verify .  Waiting on verification and will continue to monitor.

## 2016-02-01 LAB — CBC
HCT: 29.1 % — ABNORMAL LOW (ref 39.0–52.0)
Hemoglobin: 9 g/dL — ABNORMAL LOW (ref 13.0–17.0)
MCH: 30.7 pg (ref 26.0–34.0)
MCHC: 30.9 g/dL (ref 30.0–36.0)
MCV: 99.3 fL (ref 78.0–100.0)
Platelets: 162 10*3/uL (ref 150–400)
RBC: 2.93 MIL/uL — ABNORMAL LOW (ref 4.22–5.81)
RDW: 14.3 % (ref 11.5–15.5)
WBC: 3.2 10*3/uL — ABNORMAL LOW (ref 4.0–10.5)

## 2016-02-01 LAB — BASIC METABOLIC PANEL
Anion gap: 6 (ref 5–15)
BUN: 20 mg/dL (ref 6–20)
CO2: 19 mmol/L — ABNORMAL LOW (ref 22–32)
Calcium: 8 mg/dL — ABNORMAL LOW (ref 8.9–10.3)
Chloride: 116 mmol/L — ABNORMAL HIGH (ref 101–111)
Creatinine, Ser: 2.6 mg/dL — ABNORMAL HIGH (ref 0.61–1.24)
GFR calc Af Amer: 23 mL/min — ABNORMAL LOW (ref >60)
GFR calc non Af Amer: 20 mL/min — ABNORMAL LOW (ref >60)
Glucose, Bld: 92 mg/dL (ref 65–99)
Potassium: 4.1 mmol/L (ref 3.5–5.1)
Sodium: 141 mmol/L (ref 135–145)

## 2016-02-01 MED ORDER — LORAZEPAM 2 MG/ML IJ SOLN
2.0000 mg | Freq: Once | INTRAMUSCULAR | Status: AC
  Administered 2016-02-01: 2 mg via INTRAVENOUS
  Filled 2016-02-01: qty 1

## 2016-02-01 MED ORDER — LORAZEPAM 2 MG/ML IJ SOLN
0.5000 mg | Freq: Four times a day (QID) | INTRAMUSCULAR | Status: DC | PRN
Start: 2016-02-01 — End: 2016-02-02

## 2016-02-01 MED ORDER — HYDRALAZINE HCL 50 MG PO TABS
100.0000 mg | ORAL_TABLET | Freq: Three times a day (TID) | ORAL | Status: DC
  Administered 2016-02-01 – 2016-02-02 (×5): 100 mg via ORAL
  Filled 2016-02-01 (×5): qty 2

## 2016-02-01 MED ORDER — QUETIAPINE FUMARATE 25 MG PO TABS
50.0000 mg | ORAL_TABLET | Freq: Every day | ORAL | Status: DC
  Administered 2016-02-01: 50 mg via ORAL
  Filled 2016-02-01: qty 2

## 2016-02-01 NOTE — Clinical Social Work Note (Signed)
Clinical Social Work Assessment  Patient Details  Name: Wayne Fuller MRN: [id number] Date of Birth: [birth year]  Date of referral:  02/01/16               Reason for consult:  Facility Placement                Permission sought to share information with:  Facility Sport and exercise psychologist, Family Supports Permission granted to share information::  No (Patient is disoriented; completed assessment w/ son, Fritz Pickerel)  Name::     Fritz Pickerel  Agency::  SNFs  Relationship::  Son  Contact Information:  [phone]  Housing/Transportation Living arrangements for the past 2 months:  [place] of Information:  Adult Children Patient Interpreter Needed:  None Criminal Activity/Legal Involvement Pertinent to Current Situation/Hospitalization:  No - Comment as needed Significant Relationships:  Adult Children Lives with:  Self Do you feel safe going back to the place where you live?  No Need for family participation in patient care:  Yes (Comment)  Care giving concerns:  CSW received consult for possible SNF placement at time of discharge. Patient is disoriented. CSW spoke with patient's son, Larry,regarding PT recommendation of SNF placement at time of discharge. Per patient's son, patient lives alone and is currently unable to care for patient at home given patient's current physical needs and fall risk. Patient's son expressed understanding of PT recommendation and is agreeable to SNF placement at time of discharge. CSW to continue to follow and assist with discharge planning needs.   Social Worker assessment / plan:  CSW spoke with patient's son concerning possibility of rehab at [facility] before returning home.  Employment status:  Retired Nurse, adult PT Recommendations:  [place] / Referral to community resources:  [place]  Patient/Family's Response to care:  Patient's son recognizes need for rehab before returning  home and is agreeable to a SNF in [place].   Patient/Family's Understanding of and Emotional Response to Diagnosis, Current Treatment, and Prognosis:  Patient/family is realistic regarding therapy needs and expressed being hopeful for SNF placement. Patient's son expressed understanding of CSW role and discharge process. No questions/concerns about plan or treatment.    Emotional Assessment Appearance:  Appears stated age Attitude/Demeanor/Rapport:  Unable to Assess Affect (typically observed):  Unable to Assess Orientation:   (Disoriented) Alcohol / Substance use:  Not Applicable Psych involvement (Current and /or in the community):  No (Comment)  Discharge Needs  Concerns to be addressed:  Care Coordination Readmission within the last 30 days:  No Current discharge risk:  Cognitively Impaired Barriers to Discharge:  Continued Medical Work up   [employer], [place] 02/01/2016, 9:17 AM

## 2016-02-01 NOTE — Progress Notes (Signed)
[facility]  Patient Status: Active (receiving services up to time of hospitalization)  [facility] is providing the following services: RN, PT, OT and MSW  If patient discharges after hours, please call [phone].   Wayne Fuller 02/01/2016, 3:22 PM

## 2016-02-01 NOTE — NC FL2 (Signed)
[place] LEVEL OF CARE SCREENING TOOL     IDENTIFICATION  Patient Name: Wayne Fuller Birthdate: 12/22/1923 Sex: male Admission Date (Current Location): 01/29/2016  [facility] and [place] Number:  Herbalist and Address:  [facility] [place] [address] [place], [place] [address]      Provider Number: [id number]  Attending Physician Name and Address:  Domenic Polite, MD  Relative Name and Phone Number:  Fritz Pickerel, son, [phone]    Current Level of Care: Hospital Recommended Level of Care: [place] Prior Approval Number:    Date Approved/Denied:   PASRR Number: [id number]  Discharge Plan: SNF    Current Diagnoses: Patient Active Problem List   Diagnosis Date Noted  . Acute encephalopathy 01/30/2016  . Conjunctivitis of left eye 01/30/2016  . Absolute anemia 01/30/2016  . Acute on chronic renal failure ([place]) 01/30/2016  . Confusion 01/29/2016  . Dementia with behavioral disturbance 01/19/2016  . Back pain 01/19/2016  . Elevated troponin 04/02/2015  . Atrial fibrillation ([place]) 04/02/2015  . CKD (chronic kidney disease) stage 3, GFR 30-59 ml/min 08/11/2014  . GERD (gastroesophageal reflux disease) 08/11/2014  . Chest pain at rest 08/11/2014  . Chest pain 04/03/2014  . Leukocytopenia 04/29/2009  . Essential hypertension 04/29/2009  . CAD (coronary artery disease) 04/29/2009  . CHEST PAIN, PLEURITIC 04/29/2009  . TOBACCO ABUSE, HX OF 04/29/2009    Orientation RESPIRATION BLADDER Height & Weight      (Disoriented x4)  Normal Continent Weight: 70.8 kg (156 lb) Height:  5\' 10"  (177.8 cm)  BEHAVIORAL SYMPTOMS/MOOD NEUROLOGICAL BOWEL NUTRITION STATUS      Continent Diet (Please see DC Summary)  AMBULATORY STATUS COMMUNICATION OF NEEDS Skin   Limited Assist Verbally Normal                       Personal Care Assistance Level of Assistance  Bathing, Feeding, Dressing Bathing Assistance: Limited  assistance Feeding assistance: Independent Dressing Assistance: Limited assistance     Functional Limitations Info             SPECIAL CARE FACTORS FREQUENCY  PT (By licensed PT)     PT Frequency: 5x/week              Contractures      Additional Factors Info  Code Status, Allergies Code Status Info: DNR Allergies Info: NKA           Current Medications (02/01/2016):  This is the current hospital active medication list Current Facility-Administered Medications  Medication Dose Route Frequency Provider Last Rate Last Dose  . acetaminophen (TYLENOL) tablet 650 mg  650 mg Oral Q6H PRN Toy Baker, MD       Or  . acetaminophen (TYLENOL) suppository 650 mg  650 mg Rectal Q6H PRN Toy Baker, MD      . amiodarone (PACERONE) tablet 200 mg  200 mg Oral Daily Toy Baker, MD   Stopped at 01/31/16 1200  . amLODipine (NORVASC) tablet 10 mg  10 mg Oral Daily Domenic Polite, MD   Stopped at 01/31/16 1200  . bacitracin-polymyxin b (POLYSPORIN) ophthalmic ointment   Left Eye QID Malvin Johns, MD   1 application at 70/62/37 2131  . diltiazem (CARDIZEM CD) 24 hr capsule 240 mg  240 mg Oral Daily Toy Baker, MD   Stopped at 01/31/16 1200  . feeding supplement (ENSURE ENLIVE) (ENSURE ENLIVE) liquid 237 mL  237 mL Oral BID BM Domenic Polite, MD  Stopped at 01/31/16 1000  . heparin injection 5,000 Units  5,000 Units Subcutaneous Q8H Domenic Polite, MD   5,000 Units at 02/01/16 [phone]  . hydrALAZINE (APRESOLINE) injection 10 mg  10 mg Intravenous Q6H PRN Domenic Polite, MD   10 mg at 01/31/16 1434  . hydrALAZINE (APRESOLINE) tablet 100 mg  100 mg Oral TID Domenic Polite, MD   100 mg at 02/01/16 0908  . HYDROcodone-acetaminophen (NORCO/VICODIN) 5-325 MG per tablet 1-2 tablet  1-2 tablet Oral Q4H PRN Toy Baker, MD      . LORazepam (ATIVAN) injection 1 mg  1 mg Intravenous Q6H PRN Domenic Polite, MD   1 mg at 01/31/16 2131  . metoprolol tartrate  (LOPRESSOR) tablet 25 mg  25 mg Oral BID Toy Baker, MD   25 mg at 01/31/16 2132  . ondansetron (ZOFRAN) tablet 4 mg  4 mg Oral Q6H PRN Toy Baker, MD       Or  . ondansetron (ZOFRAN) injection 4 mg  4 mg Intravenous Q6H PRN Toy Baker, MD      . polyethylene glycol (MIRALAX / GLYCOLAX) packet 17 g  17 g Oral Daily PRN Toy Baker, MD      . senna (SENOKOT) tablet 8.6 mg  1 tablet Oral BID Toy Baker, MD   8.6 mg at 01/31/16 2132  . sodium chloride flush (NS) 0.9 % injection 3 mL  3 mL Intravenous Q12H Toy Baker, MD   3 mL at 01/31/16 2133  . tamsulosin (FLOMAX) capsule 0.4 mg  0.4 mg Oral QPC supper Toy Baker, MD   0.4 mg at 01/31/16 1701     Discharge Medications: Please see discharge summary for a list of discharge medications.  Relevant Imaging Results:  Relevant Lab Results:   Additional Information SSN: [place], [place]

## 2016-02-01 NOTE — Progress Notes (Signed)
   02/01/16 1600  Clinical Encounter Type  Visited With Patient  Visit Type Initial  Referral From Patient (Ancillary Consult)  Spiritual Encounters  Spiritual Needs Prayer  Stress Factors  Patient Stress Factors Other (Comment) (Pt. expressed disallusion w/ medical staff; desire to leave.)   -Reflected pt. desire to be home. -Prayed w/ pt.   - Healing  -Rev. [place] MDiv ThM

## 2016-02-01 NOTE — Evaluation (Signed)
Occupational Therapy Evaluation Patient Details Name: Wayne Fuller MRN: [id number] DOB: [birth year] Today's Date: 02/01/2016    History of Present Illness Patient presents with confusion, left eye pain, HTN, and anemia. He had HTN this morning but B/P was measured at 169/59 prior to therapy.    Clinical Impression   This 82 yo male admitted with above presents to acute OT with deficits below (see OT problem list) thus affecting his PLOF of living at home by himself per chart. He will benefit from continued OT to work back towards a more independent level of functioning for basic ADLs    Follow Up Recommendations  SNF    Equipment Recommendations  Other (comment) (TBD at SNF)       Precautions / Restrictions Precautions Precautions: Fall Restrictions Weight Bearing Restrictions: No      Mobility Bed Mobility               General bed mobility comments: Pt up in recliner upon my arriva;  Transfers Overall transfer level: Needs assistance Equipment used: 2 person hand held assist Transfers: Sit to/from Stand Sit to Stand: Mod assist              Balance Overall balance assessment: Needs assistance Sitting-balance support: Bilateral upper extremity supported;Feet supported Sitting balance-Leahy Scale: Poor     Standing balance support: Bilateral upper extremity supported Standing balance-Leahy Scale: Poor                              ADL Overall ADL's : Needs assistance/impaired Eating/Feeding: Minimal assistance;Sitting   Grooming: Minimal assistance;Sitting   Upper Body Bathing: Minimal assitance;Sitting   Lower Body Bathing: Moderate assistance;Sit to/from stand   Upper Body Dressing : Maximal assistance;Sitting   Lower Body Dressing: Maximal assistance (Mod A sit <>stand)   Toilet Transfer: Moderate assistance;Stand-pivot;BSC   Toileting- Clothing Manipulation and Hygiene: Total assistance (Mod A sit<>stand)                          Pertinent Vitals/Pain Pain Assessment: No/denies pain        Extremity/Trunk Assessment Upper Extremity Assessment Upper Extremity Assessment: Overall WFL for tasks assessed              Cognition Arousal/Alertness: Lethargic Behavior During Therapy: Restless;Agitated Overall Cognitive Status: No family/caregiver present to determine baseline cognitive functioning                                Home Living Family/patient expects to be discharged to:: Skilled nursing facility                                             OT Diagnosis: Generalized weakness;Cognitive deficits   OT Problem List: Decreased strength;Decreased activity tolerance;Impaired balance (sitting and/or standing);Decreased cognition   OT Treatment/Interventions: Self-care/ADL training;Balance training;DME and/or AE instruction;Patient/family education    OT Goals(Current goals can be found in the care plan section) Acute Rehab OT Goals Patient Stated Goal: to get his hat (which I found for him) OT Goal Formulation: With patient Time For Goal Achievement: 02/15/16 Potential to Achieve Goals: Fair  OT Frequency: Min 2X/week   Barriers to D/C: Decreased caregiver support  End of Session Equipment Utilized During Treatment: Gait belt  Activity Tolerance: Patient limited by lethargy Patient left: in chair;with call bell/phone within reach;with chair alarm set   Time: 1039-1050 OT Time Calculation (min): 11 min Charges:  OT General Charges $OT Visit: 1 Procedure OT Evaluation $OT Eval Moderate Complexity: 1 Procedure  Almon Register [phone] 02/01/2016, 10:59 AM

## 2016-02-01 NOTE — Clinical Social Work Placement (Signed)
   CLINICAL SOCIAL WORK PLACEMENT  NOTE  Date:  02/01/2016  Patient Details  Name: Wayne Fuller MRN: [id number] Date of Birth: 1923-12-01  Clinical Social Work is seeking post-discharge placement for this patient at the [place] level of care (*CSW will initial, date and re-position this form in  chart as items are completed):      Patient/family provided with [facility] Work Department's list of facilities offering this level of care within the geographic area requested by the patient (or if unable, by the patient's family).      Patient/family informed of their freedom to choose among providers that offer the needed level of care, that participate in Medicare, Medicaid or managed care program needed by the patient, have an available bed and are willing to accept the patient.      Patient/family informed of [facility]'s ownership interest in [facility] and [facility], as well as of the fact that they are under no obligation to receive care at these facilities.  PASRR submitted to EDS on 02/01/16     PASRR number received on 02/01/16     Existing PASRR number confirmed on       FL2 transmitted to all facilities in geographic area requested by pt/family on 02/01/16     FL2 transmitted to all facilities within larger geographic area on       Patient informed that his/her managed care company has contracts with or will negotiate with certain facilities, including the following:            Patient/family informed of bed offers received.  Patient chooses bed at       Physician recommends and patient chooses bed at      Patient to be transferred to   on  .  Patient to be transferred to facility by       Patient family notified on   of transfer.  Name of family member notified:        PHYSICIAN Please sign FL2, Please sign DNR     Additional Comment:    _______________________________________________ Benard Halsted,  [place] 02/01/2016, 9:19 AM

## 2016-02-01 NOTE — Progress Notes (Addendum)
PROGRESS NOTE    Wayne Fuller  DJS:[id number] DOB: 12/11/1923 DOA: 01/29/2016 PCP: [name], NP  Brief Narrative: [name] is a 82 y.o. male with medical history significant ofherniated disc on lumbar and cervical spine, dementia,   A. Fib, HTN, CKD Presented with Confusion frequent falls, eye swelling confusion has been ongoing and gradually worsening eye pain. He was seen by home health nurse was concerned family brought patient to [facility]. Patient has been feeling progressively worsening memory loss many months now ever since January. He lives alone since his wife passed away and family was concerned for his care. [facility] had been [facility] to [facility] resulting in falls  Assessment & Plan:  1. Metabolic encephalopathy -due to dehydration and worsening renal failure, in background of worsening dementia -continue IVF, monitor -avoid sedating meds -CT head unremarkable, UA and CXR ok -on ativan PRN due to increased agitation, will start low dose seroquel QHS due to agitation  2. Advanced Dementia -s/p SLP eval-mild aspiration risk-regular diet recommended for now -PT/OT consulted, SNF recommended -needs to live in a supervised environment, not safe to live alone, CSW d/w son this am, I ws unable to reach him on his cell phone  3. AKi on CKD3 -baseline creatinine 2.4, was 3.2 on admission -hydrated and improved, down to baseline of 2.6 now, stop IVF  4. P.Atrial fibrillation (HCC) -due to advanced age, progressive dementia and frequent falls, Dr.Doutova discussed risks/benefits of anticoagulation with family and decision was made to STop this in agreement with family - rate controlled, continue amiodarone, metoprolol and diltiazem  5. CAD (coronary artery disease)  -stable continue home medications  6.  Essential hypertension /Uncontrolled HTN -BP trending up more, increase hydralazine further, continue metoprolol,  added norvasc  7.  Leukopenia -chronic, for > 1 year, defer to PCP  8. Left eye conjunctivitis -  -continue abx eye drops as recommended by Dr.Groat   DVT prophylaxis:  heparin SQ      Code Status:    DNR/DNI Family Communication:  No family at bedside, tried to reach son was unable to leave a msg, called and d/w daughter and updated her on plan for SNF Disposition Plan:   SNF later today or in am     Consultants:     Procedures:    Subjective: pleasantly confused, sitting in recliner eating breakfast  Objective: Vitals:   01/31/16 2136 02/01/16 0509 02/01/16 1135 02/01/16 1139  BP: (!) 182/56 (!) 214/77 (!) 156/54 (!) 156/54  Pulse: 71 61  (!) 59  Resp: 18 17  19   Temp: 98.1 F (36.7 C) 98 F (36.7 C)  97.8 F (36.6 C)  TempSrc: Oral Oral    SpO2: 99% 96%  100%  Weight:      Height:        Intake/Output Summary (Last 24 hours) at 02/01/16 1154 Last data filed at 02/01/16 1019  Gross per 24 hour  Intake              970 ml  Output              750 ml  Net              220 ml   Filed Weights   01/29/16 1716 01/29/16 2200  Weight: 70.8 kg (156 lb) 70.8 kg (156 lb)    Examination:  General exam: Appears calm and comfortable but pleasantly confused Respiratory system: diminished bs at  bases Cardiovascular system: S1 & S2 heard, RRR. No JVD, murmurs, rubs, gallops or clicks. No pedal edema. Gastrointestinal system: Abdomen is nondistended, soft and nontender.  Normal bowel sounds heard. Central nervous system: Alert, confused, No gross  focal neurological deficits. Extremities: Symmetric 5 x 5 power. Skin: No rashes, lesions or ulcers Psychiatry: confused   Data Reviewed: I have personally reviewed following labs and imaging studies  CBC:  Recent Labs Lab 01/29/16 1916 01/30/16 0540 01/31/16 0906 02/01/16 0429  WBC 2.9* 3.1* 2.6* 3.2*  NEUTROABS 1.9  --   --   --   HGB 9.8* 9.5* 8.6* 9.0*  HCT 30.6* 30.2* 27.9* 29.1*  MCV 99.7 97.7 99.6 99.3   PLT 168 163 148* 440   Basic Metabolic Panel:  Recent Labs Lab 01/29/16 1916 01/30/16 0540 01/31/16 0906 02/01/16 0429  NA 138 142 140 141  K 4.4 4.4 3.9 4.1  CL 112* 113* 116* 116*  CO2 20* 21* 20* 19*  GLUCOSE 92 88 128* 92  BUN 26* 24* 24* 20  CREATININE 3.22* 2.97* 2.91* 2.60*  CALCIUM 8.2* 8.3* 8.0* 8.0*  MG  --  2.0  --   --   PHOS  --  3.9  --   --    GFR: Estimated Creatinine Clearance: 18.9 mL/min (by C-G formula based on SCr of 2.6 mg/dL). Liver Function Tests:  Recent Labs Lab 01/30/16 0540  AST 16  ALT 12*  ALKPHOS 57  BILITOT 0.3  PROT 5.7*  ALBUMIN 3.1*   No results for input(s): LIPASE, AMYLASE in the last 168 hours. No results for input(s): AMMONIA in the last 168 hours. Coagulation Profile: No results for input(s): INR, PROTIME in the last 168 hours. Cardiac Enzymes: No results for input(s): CKTOTAL, CKMB, CKMBINDEX, TROPONINI in the last 168 hours. BNP (last 3 results) No results for input(s): PROBNP in the last 8760 hours. HbA1C: No results for input(s): HGBA1C in the last 72 hours. CBG: No results for input(s): GLUCAP in the last 168 hours. Lipid Profile: No results for input(s): CHOL, HDL, LDLCALC, TRIG, CHOLHDL, LDLDIRECT in the last 72 hours. Thyroid Function Tests:  Recent Labs  01/30/16 0540  TSH 2.280   Anemia Panel:  Recent Labs  01/30/16 0540  VITAMINB12 208  FOLATE 6.5  FERRITIN 65  TIBC 246*  IRON 55  RETICCTPCT 1.1   Urine analysis:    Component Value Date/Time   COLORURINE YELLOW 01/29/2016 2040   APPEARANCEUR CLEAR 01/29/2016 2040   LABSPEC 1.010 01/29/2016 2040   PHURINE 5.5 01/29/2016 2040   GLUCOSEU NEGATIVE 01/29/2016 2040   HGBUR NEGATIVE 01/29/2016 2040   BILIRUBINUR NEGATIVE 01/29/2016 2040   KETONESUR NEGATIVE 01/29/2016 2040   PROTEINUR NEGATIVE 01/29/2016 2040   UROBILINOGEN 0.2 04/02/2015 1430   NITRITE NEGATIVE 01/29/2016 2040   LEUKOCYTESUR NEGATIVE 01/29/2016 2040   Sepsis  Labs: @LABRCNTIP (procalcitonin:4,lacticidven:4)  )No results found for this or any previous visit (from the past 240 hour(s)).       Radiology Studies: Dg Chest 2 View  Result Date: 01/30/2016 CLINICAL DATA:  Patient has developed a cough. History of hypertension EXAM: CHEST  2 VIEW COMPARISON:  01/02/2016. FINDINGS: PA and lateral views of the chest demonstrate no focal pulmonary infiltrate, consolidation, or pleural effusion. Faint nodular opacity in the left mid to lower lung zone similar compared to prior and likely reflects a nipple shadow. Cardiomediastinal silhouette is stable. Mild atherosclerosis of the aorta. No pneumothorax. There are degenerative osteophytes of the spine. There is partially  visualized cervical fusion hardware to the level of T1, similar compared to previous exam. There is left AC joint degenerative change. IMPRESSION: No acute infiltrate or edema. Mild atherosclerotic vascular disease of the aorta Electronically Signed   By: Donavan Foil M.D.   On: 01/30/2016 15:54        Scheduled Meds: . amiodarone  200 mg Oral Daily  . amLODipine  10 mg Oral Daily  . bacitracin-polymyxin b   Left Eye QID  . diltiazem  240 mg Oral Daily  . feeding supplement (ENSURE ENLIVE)  237 mL Oral BID BM  . heparin subcutaneous  5,000 Units Subcutaneous Q8H  . hydrALAZINE  100 mg Oral TID  . metoprolol tartrate  25 mg Oral BID  . senna  1 tablet Oral BID  . sodium chloride flush  3 mL Intravenous Q12H  . tamsulosin  0.4 mg Oral QPC supper   Continuous Infusions:     LOS: 2 days    Time spent: 77min    Domenic Polite, MD [facility] Pager [phone]  If 7PM-7AM, please contact night-coverage [email or web address] Password [facility] 02/01/2016, 11:54 AM

## 2016-02-02 MED ORDER — HYDRALAZINE HCL 100 MG PO TABS: 100.0000 mg | ORAL_TABLET | Freq: Three times a day (TID) | ORAL | 0 refills | Status: DC

## 2016-02-02 MED ORDER — AMLODIPINE BESYLATE 10 MG PO TABS: 10.0000 mg | ORAL_TABLET | Freq: Every day | ORAL | 0 refills | Status: DC

## 2016-02-02 MED ORDER — BACITRACIN-POLYMYXIN B 500-10000 UNIT/GM OP OINT: TOPICAL_OINTMENT | Freq: Three times a day (TID) | OPHTHALMIC | 0 refills | Status: DC

## 2016-02-02 MED ORDER — QUETIAPINE FUMARATE 50 MG PO TABS: 50.0000 mg | ORAL_TABLET | Freq: Every day | ORAL | 0 refills | Status: DC

## 2016-02-02 MED ORDER — POLYETHYLENE GLYCOL 3350 17 G PO PACK: 17.0000 g | PACK | Freq: Every day | ORAL | 0 refills | Status: DC | PRN

## 2016-02-02 NOTE — Progress Notes (Signed)
Physical Therapy Treatment Patient Details Name: Wayne Fuller MRN: [id number] DOB: 01/01/1924 Today's Date: 02/02/2016    History of Present Illness Patient presents with confusion, left eye pain, HTN, and anemia. He had HTN this morning but B/P was measured at 169/59 prior to therapy.     PT Comments    Current plan remains appropriate.   Follow Up Recommendations  SNF     Equipment Recommendations  Other (comment) (TBD next venue)    Recommendations for Other Services  (rehab at a SNF )     Precautions / Restrictions Precautions Precautions: Fall Restrictions Weight Bearing Restrictions: No    Mobility  Bed Mobility Overal bed mobility: Needs Assistance Bed Mobility: Supine to Sit     Supine to sit: Supervision     General bed mobility comments: supervision for safety due to impulsivity and decreased awareness of deficits  Transfers Overall transfer level: Needs assistance Equipment used: Rolling walker (2 wheeled) Transfers: Sit to/from Stand Sit to Stand: Min guard         General transfer comment: min guard for safety; multimodal cues for safe hand placement and use of AD  Ambulation/Gait Ambulation/Gait assistance: Min assist Ambulation Distance (Feet): 150 Feet Assistive device: Rolling walker (2 wheeled) Gait Pattern/deviations: Step-through pattern;Decreased stride length;Trunk flexed     General Gait Details: assist for balance and management of RW as pt tends to ambulate outside of RW at times especially with turns; mod cues for safe use of AD and posture   Stairs            Wheelchair Mobility    Modified Rankin (Stroke Patients Only)       Balance     Sitting balance-Leahy Scale: Fair       Standing balance-Leahy Scale: Poor                      Cognition Arousal/Alertness: Awake/alert Behavior During Therapy: WFL for tasks assessed/performed Overall Cognitive Status: History of cognitive impairments - at  baseline                      Exercises      General Comments General comments (skin integrity, edema, etc.): duaghter present for session      Pertinent Vitals/Pain Pain Assessment: Faces Faces Pain Scale: Hurts little more Pain Location: L foot Pain Descriptors / Indicators: Aching;Sore Pain Intervention(s): Limited activity within patient's tolerance;Monitored during session;Repositioned    Home Living                      Prior Function            PT Goals (current goals can now be found in the care plan section) Acute Rehab PT Goals Patient Stated Goal: go to rehab PT Goal Formulation: With patient Time For Goal Achievement: 02/20/16 Potential to Achieve Goals: Fair Progress towards PT goals: Progressing toward goals    Frequency  Min 3X/week    PT Plan Current plan remains appropriate    Co-evaluation             End of Session Equipment Utilized During Treatment: Gait belt Activity Tolerance: Patient tolerated treatment well Patient left: in chair;with call bell/phone within reach;with chair alarm set;with family/visitor present     Time: 0321-2248 PT Time Calculation (min) (ACUTE ONLY): 23 min  Charges:  $Gait Training: 8-22 mins $Therapeutic Activity: 8-22 mins  G Codes:      Wayne Fuller, PTA Pager: [phone]   02/02/2016, 4:39 PM

## 2016-02-02 NOTE — Care Management Important Message (Signed)
Important Message  Patient Details  Name: Wayne Fuller MRN: [id number] Date of Birth: 08-10-1923   Medicare Important Message Given:  Yes    Iris Montine Circle 02/02/2016, 12:06 PM

## 2016-02-02 NOTE — Discharge Summary (Signed)
Physician Discharge Summary  Wayne Fuller MVH:[id number] DOB: [birth year] DOA: 01/29/2016  PCP: [name], NP  Admit date: 01/29/2016 Discharge date: 02/02/2016  Time spent: 45 minutes  Recommendations for Outpatient Follow-up:  1. PCP Sherrie Mustache in 1 week, please monitor Bmet periodically, please initiate Goals of care discussions with family at Follow up   Discharge Diagnoses:    AKI on CKD 3   Dementia with Behavioral disorder   Metabolic encephalopathy   Leukocytopenia   Essential hypertension   CAD (coronary artery disease)   TOBACCO ABUSE, HX OF   CKD (chronic kidney disease) stage 3, GFR 30-59 ml/min   Atrial fibrillation (HCC)   Dementia with behavioral disturbance   Confusion   Acute encephalopathy   Conjunctivitis of left eye   Absolute anemia   Acute on chronic renal failure [facility])   Discharge Condition:stable  Diet recommendation:heart healthy low sodium  Filed Weights   01/29/16 1716 01/29/16 2200  Weight: 70.8 kg (156 lb) 70.8 kg (156 lb)    History of present illness:   Wayne Fuller a 82 y.o.malewith medical history significant ofherniated disc on lumbar and cervical spine, dementia, A. Fib, HTN, CKD Presented with Confusion frequent falls, eye swellingconfusion has been ongoing and gradually worsening eye pain. He was seen by home health nurse was concerned family brought patient to [facility] [place].Patient has been having progressively worsening memory loss  And confusion for many months now ever since January. He lives alone since his wife passed away and family was concerned for his care. She had been admitted to emergency department secondary to dehydration resulting in falls  Hospital Course:  1. Metabolic encephalopathy -worsening confusion and memory loss for months per daughter  -due to dehydration and worsening renal failure, in background of worsening dementia and behavioral problems -hydrated with saline and creatinine  improved to baseline, at baseline he is alert and awake and partly confused able to answer some questions appropriately -CT head unremarkable, UA and CXR ok, B12 and TSh were in normal range -started low dose seroquel QHS due to agitation -PT/OT eval completed, plan for SNF at discharge   2. Advanced Dementia -s/p SLP eval-mild aspiration risk-regular diet recommended for now -PT/OT consulted, SNF recommended -needs to live in a supervised environment, not safe to live alone,i called daughter and advised family to consider ALF after he is done with rehab at SNF  3. AKi on CKD3 -baseline creatinine 2.4, was 3.2 on admission -hydrated and improved, down to baseline of 2.6 now, stopped IVF  4. P.Atrial fibrillation (HCC) -due to advanced age, progressive dementia and frequent falls, Dr.Doutova discussed risks/benefits of anticoagulation with family at the time of admission and decision was made to Stop Eliquis on admission by Dr.Doutova in agreement with family -rate controlled, continue amiodarone, metoprolol and diltiazem  5.CAD (coronary artery disease) -stable continue home medications  6. Essential hypertension /Uncontrolled HTN -BP was uncontrolled, increased hydralazine, continued metoprolol, added norvasc -stable now  7. Leukopenia -chronic, for > 1 year, defer need for workup to PCP  8. Left eye conjunctivitis -  -continue abx eye drops as recommended by Dr.Groat, improved for 3-4 more days  Discharge Exam: Vitals:   02/02/16 0035 02/02/16 0634  BP: (!) 160/54 (!) 166/55  Pulse: 67 62  Resp:  16  Temp:  98.5 F (36.9 C)    General: AAOx to self only, answers some questions appropriately Cardiovascular:S1S2/RRR Respiratory: CTAB  Discharge Instructions   Discharge Instructions    Diet -  low sodium heart healthy    Complete by:  As directed   Increase activity slowly    Complete by:  As directed     Current Discharge Medication List    START  taking these medications   Details  amLODipine (NORVASC) 10 MG tablet Take 1 tablet (10 mg total) by mouth daily. Qty: 30 tablet, Refills: 0    bacitracin-polymyxin b (POLYSPORIN) ophthalmic ointment Place into the left eye 3 (three) times daily. For 4days Qty: 3.5 g, Refills: 0    polyethylene glycol (MIRALAX / GLYCOLAX) packet Take 17 g by mouth daily as needed for mild constipation. Qty: 14 each, Refills: 0    QUEtiapine (SEROQUEL) 50 MG tablet Take 1 tablet (50 mg total) by mouth at bedtime. Qty: 30 tablet, Refills: 0      CONTINUE these medications which have CHANGED   Details  hydrALAZINE (APRESOLINE) 100 MG tablet Take 1 tablet (100 mg total) by mouth 3 (three) times daily. Qty: 90 tablet, Refills: 0      CONTINUE these medications which have NOT CHANGED   Details  amiodarone (PACERONE) 200 MG tablet Take 200 mg by mouth daily.    Cholecalciferol (VITAMIN D3) 5000 units CAPS Take 1 capsule (5,000 Units total) by mouth daily. Qty: 30 capsule, Refills: 3    diltiazem (CARDIZEM CD) 240 MG 24 hr capsule Take 240 mg by mouth daily. Will obtain today from pharmacy    metoprolol succinate (TOPROL-XL) 50 MG 24 hr tablet Take 50 mg by mouth daily. Refills: 1    tamsulosin (FLOMAX) 0.4 MG CAPS capsule Take 1 capsule (0.4 mg total) by mouth daily after supper. Qty: 30 capsule, Refills: 3      STOP taking these medications     apixaban (ELIQUIS) 2.5 MG TABS tablet        No Known Allergies    The results of significant diagnostics from this hospitalization (including imaging, microbiology, ancillary and laboratory) are listed below for reference.    Significant Diagnostic Studies: Ct Abdomen Pelvis Wo Contrast  Result Date: 01/11/2016 CLINICAL DATA:  Multiple falls in the past few weeks, confusion and altered mental status, left-sided abdominal pain. History of hypertension, chronic kidney disease. EXAM: CT ABDOMEN AND PELVIS WITHOUT CONTRAST TECHNIQUE: Multidetector CT  imaging of the abdomen and pelvis was performed following the standard protocol without IV contrast. COMPARISON:  None. FINDINGS: Lower chest:  No acute findings. Hepatobiliary: No mass visualized within the liver on this un-enhanced exam. Gallbladder is unremarkable. Pancreas: No mass or inflammatory process identified on this un-enhanced exam. Spleen: Within normal limits in size. Adrenals/Urinary Tract: Scattered hypodense lesions are seen within the left renal cortex, largest measurable lesions compatible with benign simple cyst by CT density measurements. No renal stone or hydronephrosis bilaterally. No ureteral or bladder calculi identified. Bladder is unremarkable. Stomach/Bowel: Bowel is normal in caliber. No bowel wall thickening or evidence of bowel wall inflammation seen. Appendix is normal. Stomach appears normal. Vascular/Lymphatic: Scattered atherosclerotic changes of the normal caliber abdominal aorta. No enlarged lymph nodes seen in the abdomen or pelvis. Reproductive: No mass or other significant abnormality. Other: Small free fluid in the lower pelvis. No other free fluid seen. No abscess collection. No free intraperitoneal air. Musculoskeletal: Fixation rod within the right femoral neck appears intact and appropriately positioned. Surgical changes of central canal decompression at the L4-L5 vertebral body levels. Degenerative changes noted throughout the slightly scoliotic thoracolumbar spine. No acute or suspicious osseous finding. No acute osseous fracture or  dislocation. Superficial soft tissues are unremarkable. IMPRESSION: 1. Small amount of free fluid in the lower pelvis, of uncertain chronicity or significance. 2. Otherwise, no acute-appearing abnormality within the abdomen or pelvis. No source for left-sided abdominal pain identified. No bowel obstruction or evidence of bowel wall inflammation. No evidence of acute solid organ abnormality. No osseous fracture or dislocation seen. 3. Aortic  atherosclerosis. 4. Additional chronic/incidental findings detailed above. Electronically Signed   By: Franki Cabot M.D.   On: 01/11/2016 17:09   Dg Chest 2 View  Result Date: 01/30/2016 CLINICAL DATA:  Patient has developed a cough. History of hypertension EXAM: CHEST  2 VIEW COMPARISON:  01/02/2016. FINDINGS: PA and lateral views of the chest demonstrate no focal pulmonary infiltrate, consolidation, or pleural effusion. Faint nodular opacity in the left mid to lower lung zone similar compared to prior and likely reflects a nipple shadow. Cardiomediastinal silhouette is stable. Mild atherosclerosis of the aorta. No pneumothorax. There are degenerative osteophytes of the spine. There is partially visualized cervical fusion hardware to the level of T1, similar compared to previous exam. There is left AC joint degenerative change. IMPRESSION: No acute infiltrate or edema. Mild atherosclerotic vascular disease of the aorta Electronically Signed   By: Donavan Foil M.D.   On: 01/30/2016 15:54   Dg Chest 2 View  Result Date: 01/11/2016 CLINICAL DATA:  Falls over the past few weeks, altered mental status, left-sided abdominal pain. EXAM: CHEST  2 VIEW COMPARISON:  PA and lateral chest x-ray of July 14, 2015 FINDINGS: The lungs are mildly hyperinflated with hemidiaphragm flattening. There is no focal infiltrate. There is no pneumothorax or pleural effusion. Nipple shadows are prominent bilaterally. The heart and pulmonary vascularity are normal. The mediastinum is normal in width. There is mild multilevel degenerative disc disease of the thoracic spine. The observed ribs are intact. The patient has undergone posterior cervical fusion extending to T1. IMPRESSION: COPD. No pneumonia, CHF, nor other acute cardiopulmonary abnormality. Electronically Signed   By: David  Martinique M.D.   On: 01/11/2016 17:00   Ct Head Wo Contrast  Result Date: 01/29/2016 CLINICAL DATA:  Fall, weakness, confusion EXAM: CT HEAD WITHOUT  CONTRAST TECHNIQUE: Contiguous axial images were obtained from the base of the skull through the vertex without intravenous contrast. COMPARISON:  01/11/2016 FINDINGS: Brain: No intracranial hemorrhage, mass effect or midline shift. Mild cerebral atrophy again noted. Stable chronic white matter disease. No definite acute cortical infarction. No mass lesion is noted on this unenhanced scan. Vascular: No hyperdense vessel or unexpected calcification. Skull: Normal. Negative for fracture or focal lesion. Sinuses/Orbits: No acute finding. Other: None. IMPRESSION: No acute intracranial abnormality. Stable atrophy and chronic white matter disease. No definite acute cortical infarction. Electronically Signed   By: Lahoma Crocker M.D.   On: 01/29/2016 18:58   Ct Head Wo Contrast  Result Date: 01/11/2016 CLINICAL DATA:  Multiple falls.  Confusion altered mental status EXAM: CT HEAD WITHOUT CONTRAST TECHNIQUE: Contiguous axial images were obtained from the base of the skull through the vertex without intravenous contrast. COMPARISON:  None. FINDINGS: Brain: There is chronic patchy low-attenuation of the periventricular and subcortical white matter. There is a new focal area within the left frontal lobe periventricular white matter, image number 16 of series 2. Also new is a focal area of subcortical low attenuation within the left, image 20 of series 2. No evidence for acute cortical infarct, intracranial hemorrhage or mass. Vascular: Unremarkable. Skull: The mastoid air cells and paranasal sinuses appear clear.  The calvarium is intact. Sinuses/Orbits: The paranasal sinuses and mastoid air cells are clear. Other: Unremarkable. IMPRESSION: 1. Chronic microvascular disease which appears progressive when compared with 07/14/2015. 2. No acute intracranial abnormalities identified. Electronically Signed   By: Kerby Moors M.D.   On: 01/11/2016 17:12   US Renal  Result Date: 01/30/2016 CLINICAL DATA:  Acute on chronic renal  failure EXAM: RENAL / URINARY TRACT ULTRASOUND COMPLETE COMPARISON:  CT scan 01/11/2016 FINDINGS: Right Kidney: Length: 8.9 cm in length. No hydronephrosis. There is diffuse increased echogenicity probable due to chronic medical renal disease. A cyst in midpole measures 1.3 x 1.4 cm. Cyst in lower pole measures 1.4 cm. Left Kidney: Length: 9.7 cm in length. There is diffuse increased echogenicity probable chronic medical renal disease. There is a cyst in midpole measures to by 2.3 cm. A cyst in lower pole measures 2 x 1.8 cm. A second cyst in midpole measures 1.8 cm. Bladder: Appears normal for degree of bladder distention. IMPRESSION: 1. No hydronephrosis. Bilateral renal cortical increased echogenicity probable medical renal disease. Bilateral renal cysts. Unremarkable urinary bladder. Electronically Signed   By: Lahoma Crocker M.D.   On: 01/30/2016 11:28    Microbiology: No results found for this or any previous visit (from the past 240 hour(s)).   Labs: Basic Metabolic Panel:  Recent Labs Lab 01/29/16 1916 01/30/16 0540 01/31/16 0906 02/01/16 0429  NA 138 142 140 141  K 4.4 4.4 3.9 4.1  CL 112* 113* 116* 116*  CO2 20* 21* 20* 19*  GLUCOSE 92 88 128* 92  BUN 26* 24* 24* 20  CREATININE 3.22* 2.97* 2.91* 2.60*  CALCIUM 8.2* 8.3* 8.0* 8.0*  MG  --  2.0  --   --   PHOS  --  3.9  --   --    Liver Function Tests:  Recent Labs Lab 01/30/16 0540  AST 16  ALT 12*  ALKPHOS 57  BILITOT 0.3  PROT 5.7*  ALBUMIN 3.1*   No results for input(s): LIPASE, AMYLASE in the last 168 hours. No results for input(s): AMMONIA in the last 168 hours. CBC:  Recent Labs Lab 01/29/16 1916 01/30/16 0540 01/31/16 0906 02/01/16 0429  WBC 2.9* 3.1* 2.6* 3.2*  NEUTROABS 1.9  --   --   --   HGB 9.8* 9.5* 8.6* 9.0*  HCT 30.6* 30.2* 27.9* 29.1*  MCV 99.7 97.7 99.6 99.3  PLT 168 163 148* 162   Cardiac Enzymes: No results for input(s): CKTOTAL, CKMB, CKMBINDEX, TROPONINI in the last 168  hours. BNP: BNP (last 3 results)  Recent Labs  04/02/15 1440  BNP 129.3*    ProBNP (last 3 results) No results for input(s): PROBNP in the last 8760 hours.  CBG: No results for input(s): GLUCAP in the last 168 hours.     SignedDomenic Polite MD.  [facility] 02/02/2016, 11:04 AM

## 2016-02-02 NOTE — Progress Notes (Signed)
Nsg Discharge Note  Admit Date:  01/29/2016 Discharge date: 02/02/2016   [name] to be D/C'd [facility] per MD order. Called the facility to give the RN report, waited on hold for 15 minutes. Called facility back and left my name and phone number for that RN to give me a call when she was available. I have still not gotten a call back Discharge Medication:   Medication List    STOP taking these medications   ELIQUIS 2.5 MG Tabs tablet Generic drug:  apixaban     TAKE these medications   amiodarone 200 MG tablet Commonly known as:  PACERONE Take 200 mg by mouth daily.   amLODipine 10 MG tablet Commonly known as:  NORVASC Take 1 tablet (10 mg total) by mouth daily.   bacitracin-polymyxin b ophthalmic ointment Commonly known as:  POLYSPORIN Place into the left eye 3 (three) times daily. For 4days   diltiazem 240 MG 24 hr capsule Commonly known as:  CARDIZEM CD Take 240 mg by mouth daily. Will obtain today from pharmacy   hydrALAZINE 100 MG tablet Commonly known as:  APRESOLINE Take 1 tablet (100 mg total) by mouth 3 (three) times daily. What changed:  medication strength  how much to take   metoprolol succinate 50 MG 24 hr tablet Commonly known as:  TOPROL-XL Take 50 mg by mouth daily.   polyethylene glycol packet Commonly known as:  MIRALAX / GLYCOLAX Take 17 g by mouth daily as needed for mild constipation.   QUEtiapine 50 MG tablet Commonly known as:  SEROQUEL Take 1 tablet (50 mg total) by mouth at bedtime.   tamsulosin 0.4 MG Caps capsule Commonly known as:  FLOMAX Take 1 capsule (0.4 mg total) by mouth daily after supper.   Vitamin D3 5000 units Caps Take 1 capsule (5,000 Units total) by mouth daily.       Discharge Assessment: Vitals:   02/02/16 0634 02/02/16 1444  BP: (!) 166/55 (!) 137/46  Pulse: 62 (!) 50  Resp: 16 18  Temp: 98.5 F (36.9 C) 97.7 F (36.5 C)   Skin clean, dry and intact without  evidence of skin break down, no evidence of skin tears noted. IV catheter discontinued intact. Site without signs and symptoms of complications - no redness or edema noted at insertion site, patient denies c/o pain - only slight tenderness at site.  Dressing with slight pressure applied.  D/c Instructions-Education: Discharge instructions faxed to Facility by Education officer, museum.  Patient instructed to return to ED, call 911, or call MD for any changes in condition.  Patient escorted by stretcher via EMS to [facility]. Dimas Chyle, RN 02/02/2016 3:31 PM

## 2016-02-02 NOTE — Progress Notes (Signed)
Patient will DC to: [facility] Anticipated DC date: 02/02/16 Family notified: Daughter Transport by: Corey Harold   Per MD patient ready for DC to [facility]. RN, patient, patient's family, and facility notified of DC. Discharge Summary sent to facility. RN given number for report. DC packet on chart. Ambulance transport requested for patient.   CSW signing off.  Cedric Fishman, [place] Social Worker [phone]

## 2016-02-02 NOTE — Care Management Note (Signed)
Case Management Note  Patient Details  Name: Wayne Fuller MRN: [id number] Date of Birth: 06-22-1923  Subjective/Objective:   Presents with confusion,frequent falls,  history ofherniated disc on lumbar and cervical spine, dementia, A. Fib, HTN, CKD. From home alone with supportive family.  Karey Stucki (Son) Allena Earing (Daughter)    [phone]       PCP: Sherrie Mustache   Action/Plan: Plan is to d/c to SNF/Rehab. today  Expected Discharge Date:  02/02/2016              Expected Discharge Plan:  Skilled Nursing Facility  In-House Referral:  Clinical Social Work  Discharge planning Services  CM Consult  Post Acute Care Choice:    Choice offered to:     DME Arranged:    DME Agency:     HH Arranged:    [place] Agency:     Status of Service:  Completed, signed off  If discussed at [employer], dates discussed:    Additional Comments:  Sharin Mons, RN 02/02/2016, 11:23 AM

## 2016-02-02 NOTE — Clinical Social Work Placement (Signed)
   CLINICAL SOCIAL WORK PLACEMENT  NOTE  Date:  02/02/2016  Patient Details  Name: Wayne Fuller MRN: [id number] Date of Birth: 27-Mar-1924  Clinical Social Work is seeking post-discharge placement for this patient at the [place] level of care (*CSW will initial, date and re-position this form in  chart as items are completed):  Yes   Patient/family provided with [facility] Work Department's list of facilities offering this level of care within the geographic area requested by the patient (or if unable, by the patient's family).  Yes   Patient/family informed of their freedom to choose among providers that offer the needed level of care, that participate in Medicare, Medicaid or managed care program needed by the patient, have an available bed and are willing to accept the patient.  Yes   Patient/family informed of [facility]'s ownership interest in [facility] and [facility], as well as of the fact that they are under no obligation to receive care at these facilities.  PASRR submitted to EDS on 02/01/16     PASRR number received on 02/01/16     Existing PASRR number confirmed on       FL2 transmitted to all facilities in geographic area requested by pt/family on 02/01/16     FL2 transmitted to all facilities within larger geographic area on       Patient informed that his/her managed care company has contracts with or will negotiate with certain facilities, including the following:        Yes   Patient/family informed of bed offers received.  Patient chooses bed at [facility]     Physician recommends and patient chooses bed at      Patient to be transferred to [facility] on 02/02/16.  Patient to be transferred to facility by [facility]     Patient family notified on 02/02/16 of transfer.  Name of family member notified:  Avis     PHYSICIAN Please sign FL2, Please sign DNR     Additional Comment:     _______________________________________________ Benard Halsted, LCSWA 02/02/2016, 1:11 PM

## 2016-02-10 ENCOUNTER — Telehealth: Payer: Self-pay | Admitting: *Deleted

## 2016-02-10 NOTE — Telephone Encounter (Signed)
Avis called and left message and stated that patient was released from the Hospital to [facility]. Patient was given Seroquel in the hospital and daughter does not want patient taking it and wants an order faxed to facility to discontinue.

## 2016-02-10 NOTE — Telephone Encounter (Signed)
Since we do not see the patient at that facility the orders would have to go through the doctor that is seeing him there.

## 2016-02-10 NOTE — Telephone Encounter (Signed)
Left Wayne Fuller's response on Avis Voicemail.

## 2016-02-16 DIAGNOSIS — I480 Paroxysmal atrial fibrillation: Secondary | ICD-10-CM | POA: Diagnosis not present

## 2016-02-16 DIAGNOSIS — M5020 Other cervical disc displacement, unspecified cervical region: Secondary | ICD-10-CM | POA: Diagnosis not present

## 2016-02-16 DIAGNOSIS — I129 Hypertensive chronic kidney disease with stage 1 through stage 4 chronic kidney disease, or unspecified chronic kidney disease: Secondary | ICD-10-CM | POA: Diagnosis not present

## 2016-02-16 DIAGNOSIS — R296 Repeated falls: Secondary | ICD-10-CM | POA: Diagnosis not present

## 2016-02-16 DIAGNOSIS — N183 Chronic kidney disease, stage 3 (moderate): Secondary | ICD-10-CM | POA: Diagnosis not present

## 2016-02-16 DIAGNOSIS — F0391 Unspecified dementia with behavioral disturbance: Secondary | ICD-10-CM | POA: Diagnosis not present

## 2016-02-16 DIAGNOSIS — R2689 Other abnormalities of gait and mobility: Secondary | ICD-10-CM | POA: Diagnosis not present

## 2016-02-16 DIAGNOSIS — M5126 Other intervertebral disc displacement, lumbar region: Secondary | ICD-10-CM | POA: Diagnosis not present

## 2016-02-26 ENCOUNTER — Telehealth: Payer: Self-pay | Admitting: Nurse Practitioner

## 2016-02-29 ENCOUNTER — Telehealth: Payer: Self-pay

## 2016-02-29 ENCOUNTER — Other Ambulatory Visit: Payer: Self-pay

## 2016-02-29 MED ORDER — TAMSULOSIN HCL 0.4 MG PO CAPS: 0.4000 mg | ORAL_CAPSULE | Freq: Every day | ORAL | 1 refills | Status: DC

## 2016-02-29 MED ORDER — METOPROLOL SUCCINATE ER 50 MG PO TB24: 50.0000 mg | ORAL_TABLET | Freq: Every day | ORAL | 1 refills | Status: DC

## 2016-02-29 MED ORDER — QUETIAPINE FUMARATE 50 MG PO TABS
50.0000 mg | ORAL_TABLET | Freq: Every day | ORAL | 1 refills | Status: DC
Start: 2016-02-29 — End: 2016-02-29

## 2016-02-29 MED ORDER — AMIODARONE HCL 200 MG PO TABS: 200.0000 mg | ORAL_TABLET | Freq: Every day | ORAL | 1 refills | Status: DC

## 2016-02-29 MED ORDER — AMLODIPINE BESYLATE 10 MG PO TABS: 10.0000 mg | ORAL_TABLET | Freq: Every day | ORAL | 1 refills | Status: DC

## 2016-02-29 MED ORDER — HYDRALAZINE HCL 100 MG PO TABS
100.0000 mg | ORAL_TABLET | Freq: Three times a day (TID) | ORAL | 1 refills | Status: DC
Start: 2016-02-29 — End: 2016-10-20

## 2016-02-29 MED ORDER — DILTIAZEM HCL ER COATED BEADS 240 MG PO CP24: 240.0000 mg | ORAL_CAPSULE | Freq: Every day | ORAL | 1 refills | Status: DC

## 2016-02-29 MED ORDER — METOPROLOL SUCCINATE ER 25 MG PO TB24: 25.0000 mg | ORAL_TABLET | Freq: Every day | ORAL | 1 refills | Status: DC

## 2016-02-29 NOTE — Telephone Encounter (Signed)
Patient's son called to go over medication list and confirm that we have the exact same dosing and instructions.  Patient is currently taking metoprolol 25 mg once daily vs 50 mg. I updated medication list.  Patient was no longer on Seroquel, this medication was given as a mis-diagnosis according to the patient's son. Patient was feeling down due to being in a facility vs at home and the medication was prescribed while at the facility. Medication was removed.

## 2016-02-29 NOTE — Telephone Encounter (Signed)
Message left on triage voicemail: Patient needs refills on medications. Patient tried to call the pharmacy and gets no answer.   I left message on patient's son voicemail informing him refills sent to [facility]

## 2016-03-03 ENCOUNTER — Encounter: Payer: Self-pay | Admitting: Internal Medicine

## 2016-03-03 ENCOUNTER — Ambulatory Visit (INDEPENDENT_AMBULATORY_CARE_PROVIDER_SITE_OTHER): Payer: Medicare Other | Admitting: Internal Medicine

## 2016-03-03 VITALS — BP 120/60 | HR 44 | Temp 98.0°F | Wt 152.0 lb

## 2016-03-03 DIAGNOSIS — K219 Gastro-esophageal reflux disease without esophagitis: Secondary | ICD-10-CM | POA: Diagnosis not present

## 2016-03-03 DIAGNOSIS — N183 Chronic kidney disease, stage 3 unspecified: Secondary | ICD-10-CM

## 2016-03-03 DIAGNOSIS — I25119 Atherosclerotic heart disease of native coronary artery with unspecified angina pectoris: Secondary | ICD-10-CM | POA: Diagnosis not present

## 2016-03-03 DIAGNOSIS — I10 Essential (primary) hypertension: Secondary | ICD-10-CM

## 2016-03-03 DIAGNOSIS — I480 Paroxysmal atrial fibrillation: Secondary | ICD-10-CM

## 2016-03-03 DIAGNOSIS — N400 Enlarged prostate without lower urinary tract symptoms: Secondary | ICD-10-CM | POA: Diagnosis not present

## 2016-03-03 DIAGNOSIS — F02818 Dementia in other diseases classified elsewhere, unspecified severity, with other behavioral disturbance: Secondary | ICD-10-CM

## 2016-03-03 DIAGNOSIS — F0281 Dementia in other diseases classified elsewhere with behavioral disturbance: Secondary | ICD-10-CM

## 2016-03-03 DIAGNOSIS — G301 Alzheimer's disease with late onset: Secondary | ICD-10-CM | POA: Diagnosis not present

## 2016-03-03 LAB — COMPLETE METABOLIC PANEL WITH GFR
ALT: 7 U/L — ABNORMAL LOW (ref 9–46)
AST: 15 U/L (ref 10–35)
Albumin: 3.9 g/dL (ref 3.6–5.1)
Alkaline Phosphatase: 45 U/L (ref 40–115)
BUN: 28 mg/dL — ABNORMAL HIGH (ref 7–25)
CO2: 17 mmol/L — ABNORMAL LOW (ref 20–31)
Calcium: 8.6 mg/dL (ref 8.6–10.3)
Chloride: 109 mmol/L (ref 98–110)
Creat: 2.97 mg/dL — ABNORMAL HIGH (ref 0.70–1.11)
GFR, Est African American: 20 mL/min — ABNORMAL LOW (ref >=60)
GFR, Est Non African American: 18 mL/min — ABNORMAL LOW (ref >=60)
Glucose, Bld: 114 mg/dL — ABNORMAL HIGH (ref 65–99)
Potassium: 4.8 mmol/L (ref 3.5–5.3)
Sodium: 137 mmol/L (ref 135–146)
Total Bilirubin: 0.4 mg/dL (ref 0.2–1.2)
Total Protein: 6.4 g/dL (ref 6.1–8.1)

## 2016-03-03 NOTE — Progress Notes (Signed)
Location:  [facility] clinic Provider:  Rhett Najera L. Mariea Clonts, D.O., C.M.D.  Code Status: DNR Goals of Care:  Advanced Directives 01/30/2016  Does patient have an advance directive? Yes  Type of Advance Directive Living will;Healthcare Power of Attorney  Does patient want to make changes to advanced directive? No - Patient declined  Copy of advanced directive(s) in chart? No - copy requested  Would patient like information on creating an advanced directive? No - patient declined information    Chief Complaint  Patient presents with  . Follow-up    6 week follow-up and to complete form    HPI: Patient is a 82 y.o. male seen today for medical management of chronic diseases.    Feeling ok.  Eating ok at home.  Sleeps well at night.  Spirits have been good.    Did have a fall when he was using the rollator as a wheelchair.  PT had recommended the rollator instead of a cane.    Memory is about the same as before he went to the hospital.    He worked in [employer]	[place] and used a saw and had injuries to his fingers.  Quit like 50 years ago from smoking.   MMSE - Mini Mental State Exam 01/19/2016  Orientation to time 2  Orientation to Place 5  Registration 3  Attention/ Calculation 0  Recall 0  Language- name 2 objects 2  Language- repeat 0  Language- follow 3 step command 3  Language- read & follow direction 1  Write a sentence 0  Copy design 0  Total score 16   Past Medical History:  Diagnosis Date  . BPH (benign prostatic hyperplasia)   . Chronic kidney disease    ckd stage 3  . Dementia   . GERD (gastroesophageal reflux disease)   . Hemorrhoids   . History of angina   . Hyperlipemia   . Hypertension   . Irregular heart rhythm   . Paroxysmal a-fib ([place]) 04/02/2015    Past Surgical History:  Procedure Laterality Date  . BACK SURGERY    . CERVICAL SPINE SURGERY    . SPINE SURGERY      No Known Allergies    Medication List       Accurate as of 03/03/16  4:08  PM. Always use your most recent med list.          amiodarone 200 MG tablet Commonly known as:  PACERONE Take 1 tablet (200 mg total) by mouth daily.   amLODipine 10 MG tablet Commonly known as:  NORVASC Take 1 tablet (10 mg total) by mouth daily.   diltiazem 240 MG 24 hr capsule Commonly known as:  CARDIZEM CD Take 1 capsule (240 mg total) by mouth daily. Will obtain today from pharmacy   hydrALAZINE 100 MG tablet Commonly known as:  APRESOLINE Take 1 tablet (100 mg total) by mouth 3 (three) times daily.   metoprolol succinate 25 MG 24 hr tablet Commonly known as:  TOPROL-XL Take 1 tablet (25 mg total) by mouth daily.   polyethylene glycol packet Commonly known as:  MIRALAX / GLYCOLAX Take 17 g by mouth daily as needed for mild constipation.   tamsulosin 0.4 MG Caps capsule Commonly known as:  FLOMAX Take 1 capsule (0.4 mg total) by mouth daily after supper.   Vitamin D3 5000 units Caps Take 1 capsule (5,000 Units total) by mouth daily.       Review of Systems:  Review of Systems  Constitutional: Positive for weight loss. Negative for chills, fever and malaise/fatigue.  HENT: Positive for hearing loss. Negative for congestion.   Eyes: Negative for blurred vision.  Respiratory: Positive for cough. Negative for shortness of breath.   Cardiovascular: Negative for chest pain, palpitations and leg swelling.  Gastrointestinal: Negative for abdominal pain, blood in stool, constipation and melena.  Genitourinary: Negative for dysuria.  Musculoskeletal: Positive for falls.  Skin: Negative for itching and rash.  Neurological: Positive for weakness. Negative for dizziness, loss of consciousness and headaches.  Endo/Heme/Allergies: Does not bruise/bleed easily.  Psychiatric/Behavioral: Positive for memory loss. Negative for depression. The patient is not nervous/anxious and does not have insomnia.     Health Maintenance  Topic Date Due  . TETANUS/TDAP  12/02/1944  .  ZOSTAVAX  12/02/1985  . PNA vac Low Risk Adult (1 of 2 - PCV13) 12/03/1990  . INFLUENZA VACCINE  Completed    Physical Exam: Vitals:   03/03/16 1508  BP: 120/60  Pulse: (!) 44  Temp: 98 F (36.7 C)  TempSrc: Oral  SpO2: 99%  Weight: 152 lb (68.9 kg)   Body mass index is 21.81 kg/m. Physical Exam  Constitutional: No distress.  Frail tall male walks with walker, stooped posture  Cardiovascular: Normal rate, regular rhythm, normal heart sounds and intact distal pulses.   Pulmonary/Chest: Breath sounds normal. No respiratory distress.  Prolonged expiratory phase; clubbing of fingers/nails  Abdominal: Soft. Bowel sounds are normal.  Musculoskeletal: Normal range of motion. He exhibits no tenderness.  Neurological: He is alert. He displays normal reflexes.  Short term memory loss  Skin: Skin is warm and dry. Capillary refill takes less than 2 seconds.  Psychiatric: He has a normal mood and affect.    Labs reviewed: Basic Metabolic Panel:  Recent Labs  07/02/15  01/30/16 0540 01/31/16 0906 02/01/16 0429  NA 143  < > 142 140 141  K 3.4  < > 4.4 3.9 4.1  CL  --   < > 113* 116* 116*  CO2  --   < > 21* 20* 19*  GLUCOSE  --   < > 88 128* 92  BUN 19  < > 24* 24* 20  CREATININE 1.7*  < > 2.97* 2.91* 2.60*  CALCIUM  --   < > 8.3* 8.0* 8.0*  MG  --   --  2.0  --   --   PHOS  --   --  3.9  --   --   TSH 3.03  --  2.280  --   --   < > = values in this interval not displayed. Liver Function Tests:  Recent Labs  07/14/15 1450 01/11/16 1600 01/30/16 0540  AST 24 20 16   ALT 13* 10* 12*  ALKPHOS 64 51 57  BILITOT 0.6 0.6 0.3  PROT 6.3* 6.8 5.7*  ALBUMIN 3.6 4.0 3.1*    Recent Labs  01/11/16 1600  LIPASE 36    Recent Labs  01/11/16 1600  AMMONIA 13   CBC:  Recent Labs  04/02/15 1440  01/11/16 1600 01/29/16 1916 01/30/16 0540 01/31/16 0906 02/01/16 0429  WBC 3.8*  < > 2.8* 2.9* 3.1* 2.6* 3.2*  NEUTROABS 2.8  --  1.6* 1.9  --   --   --   HGB 12.2*  < >  10.7* 9.8* 9.5* 8.6* 9.0*  HCT 37.5*  < > 32.3* 30.6* 30.2* 27.9* 29.1*  MCV 95.7  < > 95.8 99.7 97.7 99.6 99.3  PLT 157  < >  164 168 163 148* 162  < > = values in this interval not displayed.  Assessment/Plan 1. Late onset Alzheimer's disease with behavioral disturbance - MMSE - Mini Mental State Exam 01/19/2016  Orientation to time 2  Orientation to Place 5  Registration 3  Attention/ Calculation 0  Recall 0  Language- name 2 objects 2  Language- repeat 0  Language- follow 3 step command 3  Language- read & follow direction 1  Write a sentence 0  Copy design 0  Total score 16   Moderate dementia and needs assistance with his meds, cooking meals except microwave, does bathe and dress with cues, son drove him today -encouraged him to hydrate better and take glucerna   2. Benign prostatic hyperplasia without lower urinary tract symptoms -cont flomax for this, he had no complaints related to this today  3. CKD (chronic kidney disease) stage 3, GFR 30-59 ml/min - COMPLETE METABOLIC PANEL WITH GFR to reassess--encouraged hydration  4. Gastroesophageal reflux disease without esophagitis -not on meds at this time for this and did not c/o symptoms  5. Paroxysmal atrial fibrillation (HCC) -on amiodarone, diltiazem and not on anticoagulation due to his falls  6. Coronary artery disease involving native coronary artery of native heart with angina pectoris ([place]) -continues on beta blocker, hydralazine, (not on ace/arb with ckd stge) -not on asa due to his falling apparently  7. Essential hypertension -bp controlled with amlodipine, diltiazem, hydralazine, metoprolol succinate  Labs/tests ordered:  Orders Placed This Encounter  Procedures  . COMPLETE METABOLIC PANEL WITH GFR    [facility]   Next appt:  3 months with Janett Billow   Emslee Lopezmartinez L. Dorion Petillo, D.O. [facility] [address], [place] [address] Cell Phone (Mon-Fri 8am-5pm):   [phone] On Call:  [phone] & follow prompts after 5pm & weekends Office Phone:  [phone] Office Fax:  [phone]

## 2016-03-31 NOTE — Telephone Encounter (Signed)
Opened in error

## 2016-06-06 ENCOUNTER — Ambulatory Visit (INDEPENDENT_AMBULATORY_CARE_PROVIDER_SITE_OTHER): Payer: Medicare Other | Admitting: Nurse Practitioner

## 2016-06-06 ENCOUNTER — Ambulatory Visit (INDEPENDENT_AMBULATORY_CARE_PROVIDER_SITE_OTHER): Payer: Medicare Other

## 2016-06-06 ENCOUNTER — Encounter: Payer: Self-pay | Admitting: Nurse Practitioner

## 2016-06-06 VITALS — BP 124/58 | HR 41 | Temp 98.0°F | Ht 70.0 in | Wt 135.2 lb

## 2016-06-06 DIAGNOSIS — I10 Essential (primary) hypertension: Secondary | ICD-10-CM | POA: Diagnosis not present

## 2016-06-06 DIAGNOSIS — N183 Chronic kidney disease, stage 3 unspecified: Secondary | ICD-10-CM

## 2016-06-06 DIAGNOSIS — G301 Alzheimer's disease with late onset: Secondary | ICD-10-CM

## 2016-06-06 DIAGNOSIS — Z23 Encounter for immunization: Secondary | ICD-10-CM | POA: Diagnosis not present

## 2016-06-06 DIAGNOSIS — Z0000 Encounter for general adult medical examination without abnormal findings: Secondary | ICD-10-CM

## 2016-06-06 DIAGNOSIS — I480 Paroxysmal atrial fibrillation: Secondary | ICD-10-CM | POA: Diagnosis not present

## 2016-06-06 DIAGNOSIS — R627 Adult failure to thrive: Secondary | ICD-10-CM | POA: Diagnosis not present

## 2016-06-06 DIAGNOSIS — F0281 Dementia in other diseases classified elsewhere with behavioral disturbance: Secondary | ICD-10-CM | POA: Diagnosis not present

## 2016-06-06 LAB — COMPLETE METABOLIC PANEL WITH GFR
ALT: 8 U/L — ABNORMAL LOW (ref 9–46)
AST: 17 U/L (ref 10–35)
Albumin: 3.8 g/dL (ref 3.6–5.1)
Alkaline Phosphatase: 52 U/L (ref 40–115)
BUN: 37 mg/dL — ABNORMAL HIGH (ref 7–25)
CO2: 21 mmol/L (ref 20–31)
Calcium: 8.7 mg/dL (ref 8.6–10.3)
Chloride: 111 mmol/L — ABNORMAL HIGH (ref 98–110)
Creat: 3.12 mg/dL — ABNORMAL HIGH (ref 0.70–1.11)
GFR, Est African American: 19 mL/min — ABNORMAL LOW (ref >=60)
GFR, Est Non African American: 17 mL/min — ABNORMAL LOW (ref >=60)
Glucose, Bld: 97 mg/dL (ref 65–99)
Potassium: 5.2 mmol/L (ref 3.5–5.3)
Sodium: 141 mmol/L (ref 135–146)
Total Bilirubin: 0.4 mg/dL (ref 0.2–1.2)
Total Protein: 6.3 g/dL (ref 6.1–8.1)

## 2016-06-06 LAB — CBC WITH DIFFERENTIAL/PLATELET
Basophils Absolute: 0 cells/uL (ref 0–200)
Basophils Relative: 0 %
Eosinophils Absolute: 26 cells/uL (ref 15–500)
Eosinophils Relative: 1 %
HCT: 30.5 % — ABNORMAL LOW (ref 38.5–50.0)
Hemoglobin: 9.8 g/dL — ABNORMAL LOW (ref 13.2–17.1)
Lymphocytes Relative: 35 %
Lymphs Abs: 910 cells/uL (ref 850–3900)
MCH: 31.1 pg (ref 27.0–33.0)
MCHC: 32.1 g/dL (ref 32.0–36.0)
MCV: 96.8 fL (ref 80.0–100.0)
MPV: 9.2 fL (ref 7.5–12.5)
Monocytes Absolute: 156 cells/uL — ABNORMAL LOW (ref 200–950)
Monocytes Relative: 6 %
Neutro Abs: 1508 cells/uL (ref 1500–7800)
Neutrophils Relative %: 58 %
Platelets: 196 10*3/uL (ref 140–400)
RBC: 3.15 MIL/uL — ABNORMAL LOW (ref 4.20–5.80)
RDW: 14.7 % (ref 11.0–15.0)
WBC: 2.6 10*3/uL — ABNORMAL LOW (ref 3.8–10.8)

## 2016-06-06 MED ORDER — DILTIAZEM HCL ER COATED BEADS 120 MG PO CP24: 120.0000 mg | ORAL_CAPSULE | Freq: Every day | ORAL | 0 refills | Status: DC

## 2016-06-06 NOTE — Progress Notes (Signed)
Careteam: Patient Care Team: [name], NP as PCP - General (Geriatric Medicine) Neldon Labella, NP as Nurse Practitioner (Cardiology)  Advanced Directive information Does Patient Have a Medical Advance Directive?: Yes, Type of Advance Directive: Healthcare Power of Attorney  No Known Allergies  Chief Complaint  Patient presents with  . Medical Management of Chronic Issues    3 month follow up     HPI: Patient is a 83 y.o. male seen in the office today for routine follow up. Pt here with son.  Had AWV today Memory is worse.  Weight is down from last visit.  Pt was 156 lb in Sept 2017, 152 lbs in Oct 2017 and today weight of 135 lbs.  Pt with advanced dementia. Lives alone. Attempting to get sitter in the house to help with care.  Pt rejected meals on wheels.  Family does medication. Family comes by and gives him his medication.  Son reports he has had quite a few falls. Falls with his walker, also a fall outside.  HR has been 40-50s since hospitalization. Follows up with cardiology PRN. Off eliquis due to multiple falls  Pt denies dizziness, being light headed, headaches, shortness of breath, chest pains however he is a very poor historian due to dementia.  Review of Systems:  Review of Systems  Unable to perform ROS: Dementia   Past Medical History:  Diagnosis Date  . BPH (benign prostatic hyperplasia)   . Chronic kidney disease    ckd stage 3  . Dementia   . GERD (gastroesophageal reflux disease)   . Hemorrhoids   . History of angina   . Hyperlipemia   . Hypertension   . Irregular heart rhythm   . Paroxysmal a-fib ([place]) 04/02/2015   Past Surgical History:  Procedure Laterality Date  . BACK SURGERY    . CERVICAL SPINE SURGERY    . SPINE SURGERY     Social History:   reports that he has quit smoking. His smoking use included Cigarettes. He has a 5.00 pack-year smoking history. He has never used smokeless tobacco. He reports that he does not drink  alcohol or use drugs.  Family History  Problem Relation Age of Onset  . Stroke Mother   . Hypertension Sister   . Other Son     Homicide, stabbed  . Stroke Son   . Hypertension Son   . Diabetes Son   . Hypertension Son     Medications: Patient's Medications  New Prescriptions   No medications on file  Previous Medications   AMIODARONE (PACERONE) 200 MG TABLET    Take 1 tablet (200 mg total) by mouth daily.   AMLODIPINE (NORVASC) 10 MG TABLET    Take 1 tablet (10 mg total) by mouth daily.   CHOLECALCIFEROL (VITAMIN D3) 5000 UNITS CAPS    Take 1 capsule (5,000 Units total) by mouth daily.   DILTIAZEM (CARDIZEM CD) 240 MG 24 HR CAPSULE    Take 1 capsule (240 mg total) by mouth daily. Will obtain today from pharmacy   HYDRALAZINE (APRESOLINE) 100 MG TABLET    Take 1 tablet (100 mg total) by mouth 3 (three) times daily.   METOPROLOL SUCCINATE (TOPROL-XL) 25 MG 24 HR TABLET    Take 1 tablet (25 mg total) by mouth daily.   POLYETHYLENE GLYCOL (MIRALAX / GLYCOLAX) PACKET    Take 17 g by mouth daily as needed for mild constipation.   TAMSULOSIN (FLOMAX) 0.4 MG CAPS CAPSULE  Take 1 capsule (0.4 mg total) by mouth daily after supper.  Modified Medications   No medications on file  Discontinued Medications   No medications on file     Physical Exam:  Vitals:   06/06/16 1033  BP: (!) 124/58  Pulse: (!) 41  Temp: 98 F (36.7 C)  TempSrc: Oral  SpO2: 99%  Weight: 135 lb 3.2 oz (61.3 kg)  Height: 5' 10"  (1.778 m)   Body mass index is 19.4 kg/m.  Physical Exam  Constitutional: No distress.  Frail tall male walks with walker, stooped posture  Cardiovascular: Normal rate, regular rhythm, normal heart sounds and intact distal pulses.   Pulmonary/Chest: Breath sounds normal. No respiratory distress.  Abdominal: Soft. Bowel sounds are normal.  Musculoskeletal: Normal range of motion. He exhibits no tenderness.  Neurological: He is alert.  Short term memory loss  Skin: Skin is  warm and dry.  Psychiatric: He has a normal mood and affect. Cognition and memory are impaired. He exhibits abnormal recent memory and abnormal remote memory.    Labs reviewed: Basic Metabolic Panel:  Recent Labs  07/02/15  01/30/16 0540 01/31/16 0906 02/01/16 0429 03/03/16 1604  NA 143  < > 142 140 141 137  K 3.4  < > 4.4 3.9 4.1 4.8  CL  --   < > 113* 116* 116* 109  CO2  --   < > 21* 20* 19* 17*  GLUCOSE  --   < > 88 128* 92 114*  BUN 19  < > 24* 24* 20 28*  CREATININE 1.7*  < > 2.97* 2.91* 2.60* 2.97*  CALCIUM  --   < > 8.3* 8.0* 8.0* 8.6  MG  --   --  2.0  --   --   --   PHOS  --   --  3.9  --   --   --   TSH 3.03  --  2.280  --   --   --   < > = values in this interval not displayed. Liver Function Tests:  Recent Labs  01/11/16 1600 01/30/16 0540 03/03/16 1604  AST 20 16 15   ALT 10* 12* 7*  ALKPHOS 51 57 45  BILITOT 0.6 0.3 0.4  PROT 6.8 5.7* 6.4  ALBUMIN 4.0 3.1* 3.9    Recent Labs  01/11/16 1600  LIPASE 36    Recent Labs  01/11/16 1600  AMMONIA 13   CBC:  Recent Labs  01/11/16 1600 01/29/16 1916 01/30/16 0540 01/31/16 0906 02/01/16 0429  WBC 2.8* 2.9* 3.1* 2.6* 3.2*  NEUTROABS 1.6* 1.9  --   --   --   HGB 10.7* 9.8* 9.5* 8.6* 9.0*  HCT 32.3* 30.6* 30.2* 27.9* 29.1*  MCV 95.8 99.7 97.7 99.6 99.3  PLT 164 168 163 148* 162   Lipid Panel: No results for input(s): CHOL, HDL, LDLCALC, TRIG, CHOLHDL, LDLDIRECT in the last 8760 hours. TSH:  Recent Labs  07/02/15 01/30/16 0540  TSH 3.03 2.280   A1C: No results found for: HGBA1C   Assessment/Plan 1. Late onset Alzheimer's disease with behavioral disturbance Family checking in with pt but he lives alone. Someone goes by to check on him daily to help him with his medication. Significant weight loss noted.  Spend a lot of time discussing with son how pt needs more assistance and care in the home. Has also had multiple falls as well.  - [facility] referral to [facility] to help with  resources  2. Failure to  thrive in adult Due to advanced dementia, living at home alone.  -encouraged son to make sure pt is eating 3 meals a day -ensure between meals for supplement.  - Ambulatory referral to [facility] with eGFR - CBC with Differential/Platelets  3. CKD (chronic kidney disease) stage 3, GFR 30-59 ml/min -encouraged proper hydration.  - CMP with eGFR - CBC with Differential/Platelets  4. Paroxysmal atrial fibrillation (HCC) -HR has been low since hospitalization. Will decrease cardizem to 120 mg daily at this time -to record HR and BP and bring to follow up visit - diltiazem (CARDIZEM CD) 120 MG 24 hr capsule; Take 1 capsule (120 mg total) by mouth daily.  Dispense: 30 capsule; Refill: 0  5. Essential hypertension Blood pressure stable. Will reduce Cardizem due to bradycardia and monitor BP  Follow up in 1 week on HR    Jessica K. Harle Battiest  [facility] [phone] 8 am - 5 pm) [phone] (after hours)

## 2016-06-06 NOTE — Progress Notes (Signed)
Quick Notes   Health Maintenance:  Pn13 given today; Due for TDAP/Shingles. Pt's son to check with Pharmacy about OOP expense.     Abnormal Screen: Yes; MMSE-14/30; failed clock test    Patient Concerns:  None;     Nurse Concerns:  Pulse is 40 today; Decreased weight. 10/17-152lb; today- 135.0lb

## 2016-06-06 NOTE — Patient Instructions (Addendum)
Will place referral to care team to help with resources Make sure he is eating 3 meals a day To use ensure supplement in between meals.   STOP current diltiazem Start diltiazem by mouth daily  Take blood pressure and heart rate daily and record-- bring to follow up appt Make follow up with Cardiologist

## 2016-06-06 NOTE — Progress Notes (Signed)
Subjective:   Wayne Fuller is a 82 y.o. male who presents for an Initial Medicare Annual Wellness Visit.  Review of Systems  Cardiac Risk Factors include: (P) advanced age (>52men, >10 women);dyslipidemia;male gender;sedentary lifestyle;smoking/ tobacco exposure    Objective:    Today's Vitals   06/06/16 0935  BP: (!) 124/58  Pulse: (!) 41  Temp: 98 F (36.7 C)  TempSrc: Oral  SpO2: 99%  Weight: 135 lb 3.2 oz (61.3 kg)  Height: 5\' 10"  (1.778 m)  PainSc: 0-No pain   Body mass index is 19.4 kg/m.  Current Medications (verified) Outpatient Encounter Prescriptions as of 06/06/2016  Medication Sig  . amiodarone (PACERONE) 200 MG tablet Take 1 tablet (200 mg total) by mouth daily.  Marland Kitchen amLODipine (NORVASC) 10 MG tablet Take 1 tablet (10 mg total) by mouth daily.  . Cholecalciferol (VITAMIN D3) 5000 units CAPS Take 1 capsule (5,000 Units total) by mouth daily.  Marland Kitchen diltiazem (CARDIZEM CD) 240 MG 24 hr capsule Take 1 capsule (240 mg total) by mouth daily. Will obtain today from pharmacy  . hydrALAZINE (APRESOLINE) 100 MG tablet Take 1 tablet (100 mg total) by mouth 3 (three) times daily.  . metoprolol succinate (TOPROL-XL) 25 MG 24 hr tablet Take 1 tablet (25 mg total) by mouth daily.  . polyethylene glycol (MIRALAX / GLYCOLAX) packet Take 17 g by mouth daily as needed for mild constipation.  . tamsulosin (FLOMAX) 0.4 MG CAPS capsule Take 1 capsule (0.4 mg total) by mouth daily after supper.   No facility-administered encounter medications on file as of 06/06/2016.     Allergies (verified) Patient has no known allergies.   History: Past Medical History:  Diagnosis Date  . BPH (benign prostatic hyperplasia)   . Chronic kidney disease    ckd stage 3  . Dementia   . GERD (gastroesophageal reflux disease)   . Hemorrhoids   . History of angina   . Hyperlipemia   . Hypertension   . Irregular heart rhythm   . Paroxysmal a-fib ([place]) 04/02/2015   Past Surgical History:    Procedure Laterality Date  . BACK SURGERY    . CERVICAL SPINE SURGERY    . SPINE SURGERY     Family History  Problem Relation Age of Onset  . Stroke Mother   . Hypertension Sister   . Other Son     Homicide, stabbed  . Stroke Son   . Hypertension Son   . Diabetes Son   . Hypertension Son    Social History   Occupational History  . Not on file.   Social History Main Topics  . Smoking status: Former Smoker    Packs/day: 0.50    Years: 10.00    Types: Cigarettes  . Smokeless tobacco: Never Used     Comment: quit smoking  around 1996  . Alcohol use No     Comment: hx of occasional ETOH use  . Drug use: No  . Sexual activity: Not Currently   Tobacco Counseling Counseling given: No   Activities of Daily Living In your present state of health, do you have any difficulty performing the following activities: 06/06/2016 01/30/2016  Hearing? Tempie Donning  Vision? N Y  Difficulty concentrating or making decisions? Tempie Donning  Walking or climbing stairs? N Y  Dressing or bathing? N N  Doing errands, shopping? Tempie Donning  Preparing Food and eating ? Y -  Using the Toilet? N -  In the past six months, have you accidently  leaked urine? Y -  Do you have problems with loss of bowel control? N -  Managing your Medications? Y -  Managing your Finances? Y -  Housekeeping or managing your Housekeeping? Y -  Some recent data might be hidden    Immunizations and Health Maintenance Immunization History  Administered Date(s) Administered  . Influenza,inj,Quad PF,36+ Mos 04/03/2015, 01/19/2016  . Pneumococcal Conjugate-13 06/06/2016   Health Maintenance Due  Topic Date Due  . PNA vac Low Risk Adult (1 of 2 - PCV13) 12/03/1990    Patient Care Team: [name], NP as PCP - General (Geriatric Medicine) Neldon Labella, NP as Nurse Practitioner (Cardiology)  Indicate any recent Medical Services you may have received from other than [facility] providers in the past year (date may be approximate).     Assessment:   This is a routine wellness examination for [place].   Hearing/Vision screen Vision Screening Comments: Pt's son is going to check with his sister about pt's ophthalmologist and let us know.   Dietary issues and exercise activities discussed: Current Exercise Habits: (P) The patient does not participate in regular exercise at present  Goals    . Increase water intake          Starting 06/06/16, I will attempt to increase my water intake to 2 bottles of water per day.       Depression Screen PHQ 2/9 Scores 06/06/2016 01/19/2016  PHQ - 2 Score 0 1    Fall Risk Fall Risk  06/06/2016 01/19/2016  Falls in the past year? Yes Yes  Number falls in past yr: 2 or more 2 or more  Injury with Fall? No No  Risk Factor Category  High Fall Risk -  Risk for fall due to : History of fall(s);Impaired balance/gait -  Follow up Falls prevention discussed -    Cognitive Function: MMSE - Mini Mental State Exam 06/06/2016 01/19/2016  Orientation to time 1 2  Orientation to Place 2 5  Registration 3 3  Attention/ Calculation 2 0  Recall 0 0  Language- name 2 objects 2 2  Language- repeat 0 0  Language- follow 3 step command 3 3  Language- read & follow direction 1 1  Write a sentence 0 0  Copy design 0 0  Total score 14 16        Screening Tests Health Maintenance  Topic Date Due  . PNA vac Low Risk Adult (1 of 2 - PCV13) 12/03/1990  . ZOSTAVAX  05/23/2020 (Originally 12/02/1985)  . TETANUS/TDAP  05/23/2020 (Originally 12/02/1942)  . INFLUENZA VACCINE  Completed        Plan:    I have personally reviewed and addressed the Medicare Annual Wellness questionnaire and have noted the following in the patient's chart:  A. Medical and social history B. Use of alcohol, tobacco or illicit drugs  C. Current medications and supplements D. Functional ability and status E.  Nutritional status F.  Physical activity G. Advance directives H. List of other physicians I.   Hospitalizations, surgeries, and ER visits in previous 12 months J.  [place] to include hearing, vision, cognitive, depression L. Referrals and appointments - none  In addition, I have reviewed and discussed with patient certain preventive protocols, quality metrics, and best practice recommendations. A written personalized care plan for preventive services as well as general preventive health recommendations were provided to patient.  See attached scanned questionnaire for additional information.   Signed,   Allyn Kenner, LPN  Health Advisor  I reviewed health advisors note, was available for consultation and agree with documentation and plan.  [name]. Harle Battiest  [facility] [phone] 8 am - 5 pm) [phone] (after hours)

## 2016-06-06 NOTE — Patient Instructions (Addendum)
Wayne Fuller , Thank you for taking time to come for your Medicare Wellness Visit. I appreciate your ongoing commitment to your health goals. Please review the following plan we discussed and let me know if I can assist you in the future.   These are the goals we discussed: Goals    . Increase water intake          Starting 06/06/16, I will attempt to increase my water intake to 2 bottles of water per day.        This is a list of the screening recommended for you and due dates:  Health Maintenance  Topic Date Due  . Pneumonia vaccines (1 of 2 - PCV13) [birth year]/03/1990  . Shingles Vaccine  05/23/2020*  . Tetanus Vaccine  05/23/2020*  . Flu Shot  Completed  *Topic was postponed. The date shown is not the original due date.  Preventive Care for Adults  A healthy lifestyle and preventive care can promote health and wellness. Preventive health guidelines for adults include the following key practices.  . A routine yearly physical is a good way to check with your health care provider about your health and preventive screening. It is a chance to share any concerns and updates on your health and to receive a thorough exam.  . Visit your dentist for a routine exam and preventive care every 6 months. Brush your teeth twice a day and floss once a day. Good oral hygiene prevents tooth decay and gum disease.  . The frequency of eye exams is based on your age, health, family medical history, use  of contact lenses, and other factors. Follow your health care provider's ecommendations for frequency of eye exams.  . Eat a healthy diet. Foods like vegetables, fruits, whole grains, low-fat dairy products, and lean protein foods contain the nutrients you need without too many calories. Decrease your intake of foods high in solid fats, added sugars, and salt. Eat the right amount of calories for you. Get information about a proper diet from your health care provider, if necessary.  . Regular physical exercise is  one of the most important things you can do for your health. Most adults should get at least 150 minutes of moderate-intensity exercise (any activity that increases your heart rate and causes you to sweat) each week. In addition, most adults need muscle-strengthening exercises on 2 or more days a week.  Silver Sneakers may be a benefit available to you. To determine eligibility, you may visit the website: www.silversneakers.com or contact program at 508-029-4026 Mon-Fri between 8AM-8PM.   . Maintain a healthy weight. The body mass index (BMI) is a screening tool to identify possible weight problems. It provides an estimate of body fat based on height and weight. Your health care provider can find your BMI and can help you achieve or maintain a healthy weight.   For adults 20 years and older: ? A BMI below 18.5 is considered underweight. ? A BMI of 18.5 to 24.9 is normal. ? A BMI of 25 to 29.9 is considered overweight. ? A BMI of 30 and above is considered obese.   . Maintain normal blood lipids and cholesterol levels by exercising and minimizing your intake of saturated fat. Eat a balanced diet with plenty of fruit and vegetables. Blood tests for lipids and cholesterol should begin at age 1 and be repeated every 5 years. If your lipid or cholesterol levels are high, you are over 50, or you are at high risk for  heart disease, you may need your cholesterol levels checked more frequently. Ongoing high lipid and cholesterol levels should be treated with medicines if diet and exercise are not working.  . If you smoke, find out from your health care provider how to quit. If you do not use tobacco, please do not start.  . If you choose to drink alcohol, please do not consume more than 2 drinks per day. One drink is considered to be 12 ounces (355 mL) of beer, 5 ounces (148 mL) of wine, or 1.5 ounces (44 mL) of liquor.  . If you are 83-37 years old, ask your health care provider if you should take  aspirin to prevent strokes.  . Use sunscreen. Apply sunscreen liberally and repeatedly throughout the day. You should seek shade when your shadow is shorter than you. Protect yourself by wearing long sleeves, pants, a wide-brimmed hat, and sunglasses year round, whenever you are outdoors.  . Once a month, do a whole body skin exam, using a mirror to look at the skin on your back. Tell your health care provider of new moles, moles that have irregular borders, moles that are larger than a pencil eraser, or moles that have changed in shape or color.

## 2016-06-07 ENCOUNTER — Other Ambulatory Visit: Payer: Self-pay

## 2016-06-07 ENCOUNTER — Other Ambulatory Visit: Payer: Self-pay | Admitting: Nurse Practitioner

## 2016-06-07 DIAGNOSIS — N184 Chronic kidney disease, stage 4 (severe): Secondary | ICD-10-CM

## 2016-06-09 ENCOUNTER — Encounter: Payer: Self-pay | Admitting: Nurse Practitioner

## 2016-06-13 ENCOUNTER — Ambulatory Visit: Payer: Medicare Other | Admitting: Nurse Practitioner

## 2016-06-15 ENCOUNTER — Telehealth: Payer: Self-pay

## 2016-06-15 DIAGNOSIS — N289 Disorder of kidney and ureter, unspecified: Secondary | ICD-10-CM

## 2016-06-15 NOTE — Telephone Encounter (Signed)
See lab results dated 06/06/16. Discussed results with patient's daughter, patient's daughter verbalized understanding of results. Referral order placed.

## 2016-06-20 ENCOUNTER — Ambulatory Visit: Payer: Medicare Other | Admitting: Nurse Practitioner

## 2016-06-21 ENCOUNTER — Other Ambulatory Visit: Payer: Self-pay | Admitting: Nurse Practitioner

## 2016-06-23 ENCOUNTER — Encounter: Payer: Self-pay | Admitting: Nurse Practitioner

## 2016-06-23 ENCOUNTER — Ambulatory Visit (INDEPENDENT_AMBULATORY_CARE_PROVIDER_SITE_OTHER): Payer: Medicare Other | Admitting: Nurse Practitioner

## 2016-06-23 VITALS — BP 118/52 | HR 55 | Temp 97.3°F | Resp 18 | Ht 70.0 in | Wt 138.0 lb

## 2016-06-23 DIAGNOSIS — R627 Adult failure to thrive: Secondary | ICD-10-CM

## 2016-06-23 DIAGNOSIS — R001 Bradycardia, unspecified: Secondary | ICD-10-CM | POA: Diagnosis not present

## 2016-06-23 DIAGNOSIS — I10 Essential (primary) hypertension: Secondary | ICD-10-CM | POA: Diagnosis not present

## 2016-06-23 DIAGNOSIS — N184 Chronic kidney disease, stage 4 (severe): Secondary | ICD-10-CM

## 2016-06-23 NOTE — Patient Instructions (Signed)
STOP Cardizem due to low heart rate  Cont ensure twice daily and proper nutrition due to weight loss   Referral has been placed for nephrology due to worsening kidney function  Make sure you are staying hydrated with proper water intake

## 2016-06-23 NOTE — Progress Notes (Signed)
Careteam: Patient Care Team: [name], NP as PCP - General (Geriatric Medicine) Neldon Labella, NP as Nurse Practitioner (Cardiology) Adrian Prows, MD as Consulting Physician (Cardiology)  Advanced Directive information Does Patient Have a Medical Advance Directive?: Yes, Type of Advance Directive: Healthcare Power of Attorney  No Known Allergies  Chief Complaint  Patient presents with  . Medical Management of Chronic Issues    1 week follow up for heart rate recheck.     HPI: Patient is a 83 y.o. male seen in the office today to follow up bradycardia. Pt with hx of dementia so HPI/ROS limited. He is alone. Says his family helps with medication.  Pt with hx of a fib and on Cardizem and amiodarone. Last visit cardizem was decreased to 120 CD. HR remains in the 50s.   pts Cr has been elevated, referral placed for nephrology. appt pending   Review of Systems:  Review of Systems  Unable to perform ROS: Dementia    Past Medical History:  Diagnosis Date  . BPH (benign prostatic hyperplasia)   . Chronic kidney disease    ckd stage 3  . Dementia   . GERD (gastroesophageal reflux disease)   . Hemorrhoids   . History of angina   . Hyperlipemia   . Hypertension   . Irregular heart rhythm   . Paroxysmal a-fib ([place]) 04/02/2015   Past Surgical History:  Procedure Laterality Date  . BACK SURGERY    . CERVICAL SPINE SURGERY    . SPINE SURGERY     Social History:   reports that he has quit smoking. His smoking use included Cigarettes. He has a 5.00 pack-year smoking history. He has never used smokeless tobacco. He reports that he does not drink alcohol or use drugs.  Family History  Problem Relation Age of Onset  . Stroke Mother   . Hypertension Sister   . Other Son     Homicide, stabbed  . Stroke Son   . Hypertension Son   . Diabetes Son   . Hypertension Son     Medications: Patient's Medications  New Prescriptions   No medications on file  Previous  Medications   AMIODARONE (PACERONE) 200 MG TABLET    Take 1 tablet (200 mg total) by mouth daily.   AMLODIPINE (NORVASC) 10 MG TABLET    Take 1 tablet (10 mg total) by mouth daily.   CHOLECALCIFEROL (VITAMIN D3) 5000 UNITS CAPS    Take 1 capsule (5,000 Units total) by mouth daily.   DILTIAZEM (CARDIZEM CD) 120 MG 24 HR CAPSULE    Take 1 capsule (120 mg total) by mouth daily.   HYDRALAZINE (APRESOLINE) 100 MG TABLET    Take 1 tablet (100 mg total) by mouth 3 (three) times daily.   METOPROLOL SUCCINATE (TOPROL-XL) 25 MG 24 HR TABLET    Take 1 tablet (25 mg total) by mouth daily.   POLYETHYLENE GLYCOL (MIRALAX / GLYCOLAX) PACKET    Take 17 g by mouth daily as needed for mild constipation.   TAMSULOSIN (FLOMAX) 0.4 MG CAPS CAPSULE    TAKE 1 CAPSULE(0.4 MG) BY MOUTH DAILY AFTER SUPPER  Modified Medications   No medications on file  Discontinued Medications   No medications on file     Physical Exam:  Vitals:   06/23/16 1313  BP: (!) 118/52  Pulse: (!) 55  Resp: 18  Temp: 97.3 F (36.3 C)  TempSrc: Oral  SpO2: 98%  Weight: 138 lb (62.6 kg)  Height: 5\' 10"  (1.778 m)   Body mass index is 19.8 kg/m.  Physical Exam  Constitutional: No distress.  Frail tall male walks with walker, stooped posture  Cardiovascular: Normal rate, regular rhythm, normal heart sounds and intact distal pulses.   Pulmonary/Chest: Breath sounds normal. No respiratory distress.  Abdominal: Soft. Bowel sounds are normal.  Musculoskeletal: Normal range of motion. He exhibits no tenderness.  Neurological: He is alert.  Short term memory loss  Skin: Skin is warm and dry.  Psychiatric: He has a normal mood and affect. Cognition and memory are impaired. He exhibits abnormal recent memory and abnormal remote memory.   Labs reviewed: Basic Metabolic Panel:  Recent Labs  07/02/15  01/30/16 0540  02/01/16 0429 03/03/16 1604 06/06/16 1108  NA 143  < > 142  < > 141 137 141  K 3.4  < > 4.4  < > 4.1 4.8 5.2  CL   --   < > 113*  < > 116* 109 111*  CO2  --   < > 21*  < > 19* 17* 21  GLUCOSE  --   < > 88  < > 92 114* 97  BUN 19  < > 24*  < > 20 28* 37*  CREATININE 1.7*  < > 2.97*  < > 2.60* 2.97* 3.12*  CALCIUM  --   < > 8.3*  < > 8.0* 8.6 8.7  MG  --   --  2.0  --   --   --   --   PHOS  --   --  3.9  --   --   --   --   TSH 3.03  --  2.280  --   --   --   --   < > = values in this interval not displayed. Liver Function Tests:  Recent Labs  01/30/16 0540 03/03/16 1604 06/06/16 1108  AST 16 15 17   ALT 12* 7* 8*  ALKPHOS 57 45 52  BILITOT 0.3 0.4 0.4  PROT 5.7* 6.4 6.3  ALBUMIN 3.1* 3.9 3.8    Recent Labs  01/11/16 1600  LIPASE 36    Recent Labs  01/11/16 1600  AMMONIA 13   CBC:  Recent Labs  01/11/16 1600 01/29/16 1916  01/31/16 0906 02/01/16 0429 06/06/16 1108  WBC 2.8* 2.9*  < > 2.6* 3.2* 2.6*  NEUTROABS 1.6* 1.9  --   --   --  1,508  HGB 10.7* 9.8*  < > 8.6* 9.0* 9.8*  HCT 32.3* 30.6*  < > 27.9* 29.1* 30.5*  MCV 95.8 99.7  < > 99.6 99.3 96.8  PLT 164 168  < > 148* 162 196  < > = values in this interval not displayed. Lipid Panel: No results for input(s): CHOL, HDL, LDLCALC, TRIG, CHOLHDL, LDLDIRECT in the last 8760 hours. TSH:  Recent Labs  07/02/15 01/30/16 0540  TSH 3.03 2.280   A1C: No results found for: HGBA1C   Assessment/Plan 1. Bradycardia -improved but still with bradycardia. -will stop Cardizem, remains on metoprolol and amiodarone for rate due to hx of a fib -SR today - EKG 12-Lead  2. Failure to thrive in adult -again encouraged ensure with proper nutrition and hydration.   3. Essential hypertension Blood pressure stable. Should tolerate stopping Cardizem   4. CKD (chronic kidney disease) stage 4, GFR 15-29 ml/min (HCC) -pts family reports they would not pursue dialysis but would like to possibly see nephrologist. Encouraged proper nutrition and hydration.  Follow up in 2 weeks on BP/HR Jannette Cotham K. Harle Battiest  [facility] [phone] 8 am - 5 pm) [phone] (after hours)

## 2016-07-03 ENCOUNTER — Other Ambulatory Visit: Payer: Self-pay | Admitting: Nurse Practitioner

## 2016-07-03 DIAGNOSIS — I480 Paroxysmal atrial fibrillation: Secondary | ICD-10-CM

## 2016-07-11 ENCOUNTER — Ambulatory Visit (INDEPENDENT_AMBULATORY_CARE_PROVIDER_SITE_OTHER): Payer: Medicare Other | Admitting: Nurse Practitioner

## 2016-07-11 ENCOUNTER — Encounter: Payer: Self-pay | Admitting: Nurse Practitioner

## 2016-07-11 VITALS — BP 160/54 | HR 58 | Temp 97.9°F | Resp 17 | Ht 70.0 in | Wt 141.6 lb

## 2016-07-11 DIAGNOSIS — I10 Essential (primary) hypertension: Secondary | ICD-10-CM | POA: Diagnosis not present

## 2016-07-11 DIAGNOSIS — R001 Bradycardia, unspecified: Secondary | ICD-10-CM | POA: Diagnosis not present

## 2016-07-11 DIAGNOSIS — R627 Adult failure to thrive: Secondary | ICD-10-CM | POA: Diagnosis not present

## 2016-07-11 NOTE — Progress Notes (Signed)
Careteam: Patient Care Team: [name], NP as PCP - General (Geriatric Medicine) Neldon Labella, NP as Nurse Practitioner (Cardiology) Adrian Prows, MD as Consulting Physician (Cardiology)  Advanced Directive information Type of Advance Directive: Healthcare Power of Attorney  No Known Allergies  Chief Complaint  Patient presents with  . Medical Management of Chronic Issues    2 week follow up on heart rate     HPI: Patient is a 83 y.o. male seen in the office today to follow up bradycardia at last visit.  Pt HR was 55 after reducing Cardizem. Pt remains on metoprolol and amiodarone for rate control.  Has not taken medication today.  Pt taking norvasc, hydralazine, metoprolol for blood pressure.   Son reports he has started ensure with meals. Reports he is taking about 2 a day.  No recent falls reported  No dizziness or light headedness reported  Review of Systems:  Review of Systems  Unable to perform ROS: Dementia    Past Medical History:  Diagnosis Date  . BPH (benign prostatic hyperplasia)   . Chronic kidney disease    ckd stage 3  . Dementia   . GERD (gastroesophageal reflux disease)   . Hemorrhoids   . History of angina   . Hyperlipemia   . Hypertension   . Irregular heart rhythm   . Paroxysmal a-fib ([place]) 04/02/2015   Past Surgical History:  Procedure Laterality Date  . BACK SURGERY    . CERVICAL SPINE SURGERY    . SPINE SURGERY     Social History:   reports that he has quit smoking. His smoking use included Cigarettes. He has a 5.00 pack-year smoking history. He has never used smokeless tobacco. He reports that he does not drink alcohol or use drugs.  Family History  Problem Relation Age of Onset  . Stroke Mother   . Hypertension Sister   . Other Son     Homicide, stabbed  . Stroke Son   . Hypertension Son   . Diabetes Son   . Hypertension Son     Medications: Patient's Medications  New Prescriptions   No medications on file    Previous Medications   AMIODARONE (PACERONE) 200 MG TABLET    Take 1 tablet (200 mg total) by mouth daily.   AMLODIPINE (NORVASC) 10 MG TABLET    Take 1 tablet (10 mg total) by mouth daily.   CHOLECALCIFEROL (VITAMIN D3) 5000 UNITS CAPS    Take 1 capsule (5,000 Units total) by mouth daily.   HYDRALAZINE (APRESOLINE) 100 MG TABLET    Take 1 tablet (100 mg total) by mouth 3 (three) times daily.   METOPROLOL SUCCINATE (TOPROL-XL) 25 MG 24 HR TABLET    Take 1 tablet (25 mg total) by mouth daily.   POLYETHYLENE GLYCOL (MIRALAX / GLYCOLAX) PACKET    Take 17 g by mouth daily as needed for mild constipation.   TAMSULOSIN (FLOMAX) 0.4 MG CAPS CAPSULE    TAKE 1 CAPSULE(0.4 MG) BY MOUTH DAILY AFTER SUPPER  Modified Medications   No medications on file  Discontinued Medications   CARTIA XT 120 MG 24 HR CAPSULE         Physical Exam:  Vitals:   07/11/16 1310  BP: (!) 160/54  Pulse: (!) 58  Resp: 17  Temp: 97.9 F (36.6 C)  TempSrc: Oral  SpO2: 99%  Weight: 141 lb 9.6 oz (64.2 kg)  Height: 5\' 10"  (1.778 m)   There is no height or  weight on file to calculate BMI.  Physical Exam  Constitutional: No distress.  Frail tall male walks with walker, stooped posture  Cardiovascular: Normal rate, regular rhythm, normal heart sounds and intact distal pulses.   Pulmonary/Chest: Effort normal and breath sounds normal. No respiratory distress.  Abdominal: Soft. Bowel sounds are normal.  Musculoskeletal: Normal range of motion.  Neurological: He is alert.  Short term memory loss  Skin: Skin is warm and dry.  Psychiatric: He has a normal mood and affect. Cognition and memory are impaired. He exhibits abnormal recent memory and abnormal remote memory.    Labs reviewed: Basic Metabolic Panel:  Recent Labs  01/30/16 0540  02/01/16 0429 03/03/16 1604 06/06/16 1108  NA 142  < > 141 137 141  K 4.4  < > 4.1 4.8 5.2  CL 113*  < > 116* 109 111*  CO2 21*  < > 19* 17* 21  GLUCOSE 88  < > 92 114* 97   BUN 24*  < > 20 28* 37*  CREATININE 2.97*  < > 2.60* 2.97* 3.12*  CALCIUM 8.3*  < > 8.0* 8.6 8.7  MG 2.0  --   --   --   --   PHOS 3.9  --   --   --   --   TSH 2.280  --   --   --   --   < > = values in this interval not displayed. Liver Function Tests:  Recent Labs  01/30/16 0540 03/03/16 1604 06/06/16 1108  AST 16 15 17   ALT 12* 7* 8*  ALKPHOS 57 45 52  BILITOT 0.3 0.4 0.4  PROT 5.7* 6.4 6.3  ALBUMIN 3.1* 3.9 3.8    Recent Labs  01/11/16 1600  LIPASE 36    Recent Labs  01/11/16 1600  AMMONIA 13   CBC:  Recent Labs  01/11/16 1600 01/29/16 1916  01/31/16 0906 02/01/16 0429 06/06/16 1108  WBC 2.8* 2.9*  < > 2.6* 3.2* 2.6*  NEUTROABS 1.6* 1.9  --   --   --  1,508  HGB 10.7* 9.8*  < > 8.6* 9.0* 9.8*  HCT 32.3* 30.6*  < > 27.9* 29.1* 30.5*  MCV 95.8 99.7  < > 99.6 99.3 96.8  PLT 164 168  < > 148* 162 196  < > = values in this interval not displayed. Lipid Panel: No results for input(s): CHOL, HDL, LDLCALC, TRIG, CHOLHDL, LDLDIRECT in the last 8760 hours. TSH:  Recent Labs  01/30/16 0540  TSH 2.280   A1C: No results found for: HGBA1C   Assessment/Plan 1. Bradycardia Improved, however still low, seems to be asymptomatic however a poor historian. Will have son take HR at home and call office with readings.   2. Failure to thrive in adult Weight trending up since last visit, son giving him ensure and making sure he has adequate meals  3. Essential hypertension Elevated today however did not take medication, son will take blood pressure and call next week with results.  To cont current medication  followup in 2 months, sooner if needed  Jessica K. Harle Battiest  [facility] [phone] 8 am - 5 pm) [phone] (after hours)

## 2016-07-11 NOTE — Patient Instructions (Signed)
Take blood pressure and HR 3 times from now until next week Record and call to office next week with values   Cont all medication as prescribed.

## 2016-08-19 ENCOUNTER — Other Ambulatory Visit: Payer: Self-pay | Admitting: Nurse Practitioner

## 2016-08-22 ENCOUNTER — Other Ambulatory Visit: Payer: Self-pay | Admitting: *Deleted

## 2016-08-22 MED ORDER — AMLODIPINE BESYLATE 10 MG PO TABS: 10.0000 mg | ORAL_TABLET | Freq: Every day | ORAL | 1 refills | Status: DC

## 2016-08-22 MED ORDER — METOPROLOL SUCCINATE ER 25 MG PO TB24: 25.0000 mg | ORAL_TABLET | Freq: Every day | ORAL | 1 refills | Status: DC

## 2016-08-22 NOTE — Telephone Encounter (Signed)
[facility].

## 2016-08-22 NOTE — Telephone Encounter (Signed)
[facility]

## 2016-08-24 ENCOUNTER — Other Ambulatory Visit: Payer: Self-pay | Admitting: *Deleted

## 2016-08-24 MED ORDER — METOPROLOL SUCCINATE ER 25 MG PO TB24: 25.0000 mg | ORAL_TABLET | Freq: Every day | ORAL | 1 refills | Status: DC

## 2016-08-24 MED ORDER — AMLODIPINE BESYLATE 10 MG PO TABS: 10.0000 mg | ORAL_TABLET | Freq: Every day | ORAL | 1 refills | Status: DC

## 2016-08-24 NOTE — Telephone Encounter (Signed)
[facility] [address] [place]

## 2016-09-08 ENCOUNTER — Ambulatory Visit: Payer: Medicare Other | Admitting: Nurse Practitioner

## 2016-09-11 ENCOUNTER — Emergency Department (HOSPITAL_COMMUNITY): Payer: Medicare Other

## 2016-09-11 ENCOUNTER — Emergency Department (HOSPITAL_COMMUNITY)
Admission: EM | Admit: 2016-09-11 | Discharge: 2016-09-11 | Disposition: A | Discharge status: Home / Self Care | Payer: Medicare Other | Attending: Emergency Medicine | Admitting: Emergency Medicine

## 2016-09-11 ENCOUNTER — Encounter (HOSPITAL_COMMUNITY): Payer: Self-pay | Admitting: Emergency Medicine

## 2016-09-11 DIAGNOSIS — W06XXXA Fall from bed, initial encounter: Secondary | ICD-10-CM | POA: Diagnosis not present

## 2016-09-11 DIAGNOSIS — Z7982 Long term (current) use of aspirin: Secondary | ICD-10-CM | POA: Diagnosis not present

## 2016-09-11 DIAGNOSIS — Y999 Unspecified external cause status: Secondary | ICD-10-CM | POA: Diagnosis not present

## 2016-09-11 DIAGNOSIS — Y9289 Other specified places as the place of occurrence of the external cause: Secondary | ICD-10-CM | POA: Insufficient documentation

## 2016-09-11 DIAGNOSIS — Z87891 Personal history of nicotine dependence: Secondary | ICD-10-CM | POA: Diagnosis not present

## 2016-09-11 DIAGNOSIS — F0390 Unspecified dementia without behavioral disturbance: Secondary | ICD-10-CM | POA: Insufficient documentation

## 2016-09-11 DIAGNOSIS — N183 Chronic kidney disease, stage 3 (moderate): Secondary | ICD-10-CM | POA: Diagnosis not present

## 2016-09-11 DIAGNOSIS — I129 Hypertensive chronic kidney disease with stage 1 through stage 4 chronic kidney disease, or unspecified chronic kidney disease: Secondary | ICD-10-CM | POA: Insufficient documentation

## 2016-09-11 DIAGNOSIS — Z048 Encounter for examination and observation for other specified reasons: Secondary | ICD-10-CM | POA: Diagnosis not present

## 2016-09-11 DIAGNOSIS — Y9389 Activity, other specified: Secondary | ICD-10-CM | POA: Insufficient documentation

## 2016-09-11 DIAGNOSIS — W19XXXA Unspecified fall, initial encounter: Secondary | ICD-10-CM

## 2016-09-11 DIAGNOSIS — Z79899 Other long term (current) drug therapy: Secondary | ICD-10-CM | POA: Diagnosis not present

## 2016-09-11 LAB — BASIC METABOLIC PANEL
Anion gap: 9 (ref 5–15)
BUN: 42 mg/dL — ABNORMAL HIGH (ref 6–20)
CO2: 22 mmol/L (ref 22–32)
Calcium: 8.9 mg/dL (ref 8.9–10.3)
Chloride: 109 mmol/L (ref 101–111)
Creatinine, Ser: 2.34 mg/dL — ABNORMAL HIGH (ref 0.61–1.24)
GFR calc Af Amer: 27 mL/min — ABNORMAL LOW (ref >60)
GFR calc non Af Amer: 23 mL/min — ABNORMAL LOW (ref >60)
Glucose, Bld: 106 mg/dL — ABNORMAL HIGH (ref 65–99)
Potassium: 4.9 mmol/L (ref 3.5–5.1)
Sodium: 140 mmol/L (ref 135–145)

## 2016-09-11 LAB — CBC WITH DIFFERENTIAL/PLATELET
Basophils Absolute: 0 10*3/uL (ref 0.0–0.1)
Basophils Relative: 0 %
Eosinophils Absolute: 0 10*3/uL (ref 0.0–0.7)
Eosinophils Relative: 0 %
HCT: 30.8 % — ABNORMAL LOW (ref 39.0–52.0)
Hemoglobin: 10 g/dL — ABNORMAL LOW (ref 13.0–17.0)
Lymphocytes Relative: 9 %
Lymphs Abs: 0.5 10*3/uL — ABNORMAL LOW (ref 0.7–4.0)
MCH: 30 pg (ref 26.0–34.0)
MCHC: 32.5 g/dL (ref 30.0–36.0)
MCV: 92.5 fL (ref 78.0–100.0)
Monocytes Absolute: 0.5 10*3/uL (ref 0.1–1.0)
Monocytes Relative: 8 %
Neutro Abs: 5.2 10*3/uL (ref 1.7–7.7)
Neutrophils Relative %: 83 %
Platelets: 161 10*3/uL (ref 150–400)
RBC: 3.33 MIL/uL — ABNORMAL LOW (ref 4.22–5.81)
RDW: 16.6 % — ABNORMAL HIGH (ref 11.5–15.5)
WBC: 6.2 10*3/uL (ref 4.0–10.5)

## 2016-09-11 LAB — URINALYSIS, ROUTINE W REFLEX MICROSCOPIC
Bilirubin Urine: NEGATIVE
Glucose, UA: NEGATIVE mg/dL
Ketones, ur: NEGATIVE mg/dL
Nitrite: NEGATIVE
Protein, ur: 100 mg/dL — AB
Specific Gravity, Urine: 1.012 (ref 1.005–1.030)
Squamous Epithelial / LPF: NONE SEEN
pH: 5 (ref 5.0–8.0)

## 2016-09-11 MED ORDER — LORAZEPAM 2 MG/ML IJ SOLN: 1.0000 mg | Freq: Once | INTRAMUSCULAR | Status: DC

## 2016-09-11 MED ORDER — DIPHENHYDRAMINE HCL 25 MG PO CAPS
50.0000 mg | ORAL_CAPSULE | Freq: Once | ORAL | Status: AC
  Administered 2016-09-11: 50 mg via ORAL
  Filled 2016-09-11: qty 2

## 2016-09-11 MED ORDER — LORAZEPAM 1 MG PO TABS
1.0000 mg | ORAL_TABLET | Freq: Once | ORAL | Status: AC
  Administered 2016-09-11: 1 mg via ORAL
  Filled 2016-09-11: qty 1

## 2016-09-11 NOTE — ED Notes (Signed)
Safety sitter at bedside 

## 2016-09-11 NOTE — ED Triage Notes (Addendum)
Per EMS, pt coming from [facility]. Staff reported pt fell out of bed. Bed is very low to the ground. Pt has splint brace on left leg. Pt has dementia and Hx HTN.    184/82 HR 96 Resp 18 CBG 140

## 2016-09-11 NOTE — ED Notes (Addendum)
[facility] call to  given report regarding patient coming back to the facility but nobody answer the phone.

## 2016-09-11 NOTE — Discharge Instructions (Signed)
Follow-up with her primary care doctor in 24-48 hours for further evaluation.  Return to the emergency department for any worsening pain, confusion, vomiting, any other worsening or concerning symptoms

## 2016-09-11 NOTE — ED Provider Notes (Signed)
[facility] DEPT Provider Note   CSN: [id number] Arrival date & time: 09/11/16  0609     History   Chief Complaint Chief Complaint  Patient presents with  . Fall    HPI Wayne Fuller is a 83 y.o. male with PMH/o HTN, Dementia presents from [place] [facility] nursing home [place]) with complaints of fall that occurred earlier this morning. Per report nursing home staff, patient fell out of bed this morning, but note that bed is very low to the ground. Patient was recently admitted to the nursing home on April 17 after a mechanical fall and subsequent left femur fracture. On ED arrival he has a left leg splint in place. History limited by patient's baseline dementia.   The history is limited by the condition of the patient.    Past Medical History:  Diagnosis Date  . BPH (benign prostatic hyperplasia)   . Chronic kidney disease    ckd stage 3  . Dementia   . GERD (gastroesophageal reflux disease)   . Hemorrhoids   . History of angina   . Hyperlipemia   . Hypertension   . Irregular heart rhythm   . Paroxysmal A-fib ([place]) 04/02/2015    Patient Active Problem List   Diagnosis Date Noted  . Acute encephalopathy 01/30/2016  . Conjunctivitis of left eye 01/30/2016  . Absolute anemia 01/30/2016  . Acute on chronic renal failure ([place]) 01/30/2016  . Confusion 01/29/2016  . Dementia with behavioral disturbance 01/19/2016  . Back pain 01/19/2016  . Elevated troponin 04/02/2015  . Atrial fibrillation ([place]) 04/02/2015  . CKD (chronic kidney disease) stage 3, GFR 30-59 ml/min 08/11/2014  . GERD (gastroesophageal reflux disease) 08/11/2014  . Chest pain at rest 08/11/2014  . Chest pain 04/03/2014  . Leukocytopenia 04/29/2009  . Essential hypertension 04/29/2009  . CAD (coronary artery disease) 04/29/2009  . CHEST PAIN, PLEURITIC 04/29/2009  . TOBACCO ABUSE, HX OF 04/29/2009    Past Surgical History:  Procedure Laterality Date  . BACK SURGERY    . CERVICAL SPINE  SURGERY    . SPINE SURGERY         Home Medications    Prior to Admission medications   Medication Sig Start Date End Date Taking? Authorizing Provider  amiodarone (PACERONE) 200 MG tablet Take 1 tablet (200 mg total) by mouth daily. 02/29/16  Yes [name], NP  amLODipine (NORVASC) 10 MG tablet Take 1 tablet (10 mg total) by mouth daily. 08/24/16  Yes [name], NP  aspirin EC 81 MG tablet Take 81 mg by mouth daily.   Yes Historical Provider, MD  cephALEXin (KEFLEX) 500 MG capsule Take 500 mg by mouth every 12 (twelve) hours. 09/10/16 09/13/16 Yes Historical Provider, MD  heparin 5000 UNIT/ML injection Inject 5,000 Units into the skin daily. 09/10/16  Yes Historical Provider, MD  hydrALAZINE (APRESOLINE) 100 MG tablet Take 1 tablet (100 mg total) by mouth 3 (three) times daily. 02/29/16  Yes [name], NP  metoprolol succinate (TOPROL-XL) 25 MG 24 hr tablet Take 1 tablet (25 mg total) by mouth daily. 08/24/16  Yes [name], NP  pantoprazole (PROTONIX) 40 MG tablet Take 40 mg by mouth daily.   Yes Historical Provider, MD  tamsulosin (FLOMAX) 0.4 MG CAPS capsule TAKE 1 CAPSULE(0.4 MG) BY MOUTH DAILY AFTER SUPPER 06/21/16  Yes [name], NP  traMADol (ULTRAM) 50 MG tablet Take 50 mg by mouth every 6 (six) hours as needed. 09/10/16 09/15/16 Yes Historical Provider, MD  Family History Family History  Problem Relation Age of Onset  . Stroke Mother   . Hypertension Sister   . Other Son     Homicide, stabbed  . Stroke Son   . Hypertension Son   . Diabetes Son   . Hypertension Son     Social History Social History  Substance Use Topics  . Smoking status: Former Smoker    Packs/day: 0.50    Years: 10.00    Types: Cigarettes  . Smokeless tobacco: Never Used     Comment: quit smoking  around 1996  . Alcohol use No     Comment: hx of occasional ETOH use     Allergies   Patient has no known allergies.   Review of Systems Review of Systems    Unable to perform ROS: Dementia     Physical Exam Updated Vital Signs BP (!) 170/85   Pulse 86   Temp 98.4 F (36.9 C) (Oral)   Resp 18   SpO2 100%   Physical Exam  Constitutional: He appears well-developed and well-nourished.  HENT:  Head: Normocephalic and atraumatic.  Mouth/Throat: Oropharynx is clear and moist and mucous membranes are normal.  Eyes: Conjunctivae, EOM and lids are normal. Pupils are equal, round, and reactive to light.  Arcus senilis bilaterally  Neck: Full passive range of motion without pain.  Cardiovascular: Normal rate, regular rhythm, normal heart sounds and normal pulses.  Exam reveals no gallop and no friction rub.   No murmur heard. Pulmonary/Chest: Effort normal and breath sounds normal.  Abdominal: Soft. Normal appearance. There is no tenderness. There is no rigidity and no guarding.  Musculoskeletal: Normal range of motion.       Cervical back: He exhibits no tenderness.       Thoracic back: He exhibits no tenderness.       Lumbar back: He exhibits no tenderness.  Left lower extremity with splint in place. No deformities or crepitus noted.  Full range of motion of right lower extremity and bilateral upper extremities. No deformities or crepitus noted. No midline tenderness to C, T, or L spine. No deformities or crepitus noted.   Neurological: He is alert.  A&O x2. Moves all extremities spontaneously.  Able to follow most commands 5/5 strength BUE.  Limited strength of LLE secondary to pre-existing injury   Skin: Skin is warm and dry. Capillary refill takes less than 2 seconds.  Psychiatric: He has a normal mood and affect. His speech is normal.  Nursing note and vitals reviewed.    ED Treatments / Results  Labs (all labs ordered are listed, but only abnormal results are displayed) Labs Reviewed  URINALYSIS, ROUTINE W REFLEX MICROSCOPIC - Abnormal; Notable for the following:       Result Value   Hgb urine dipstick MODERATE (*)     Protein, ur 100 (*)    Leukocytes, UA TRACE (*)    Bacteria, UA RARE (*)    All other components within normal limits  BASIC METABOLIC PANEL - Abnormal; Notable for the following:    Glucose, Bld 106 (*)    BUN 42 (*)    Creatinine, Ser 2.34 (*)    GFR calc non Af Amer 23 (*)    GFR calc Af Amer 27 (*)    All other components within normal limits  CBC WITH DIFFERENTIAL/PLATELET - Abnormal; Notable for the following:    RBC 3.33 (*)    Hemoglobin 10.0 (*)    HCT 30.8 (*)  RDW 16.6 (*)    Lymphs Abs 0.5 (*)    All other components within normal limits    EKG  EKG Interpretation None       Radiology Dg Chest 2 View  Result Date: 09/11/2016 CLINICAL DATA:  Patient altered mental status. Evaluate for pneumonia. EXAM: CHEST  2 VIEW COMPARISON:  Chest radiograph 09/06/2016 FINDINGS: Normal cardiac and mediastinal contours. No consolidative pulmonary opacities. No pleural effusion for pneumothorax. Nodular densities projecting over the lower lungs bilaterally. Thoracic spine degenerative changes. IMPRESSION: Nodular densities projecting over the lower lungs bilaterally favored to represent nipple shadows. Consider confirmation with repeat chest radiograph and nipple markers. No acute cardiopulmonary process. Electronically Signed   By: Lovey Newcomer M.D.   On: 09/11/2016 09:24   Ct Head Wo Contrast  Result Date: 09/11/2016 CLINICAL DATA:  s/p unwitnessed fall, . Staff reported pt fell out of bed. Bed is very low to the ground. Pt has splint brace on left leg. Pt has dementia and Hx HTN. EXAM: CT HEAD WITHOUT CONTRAST TECHNIQUE: Contiguous axial images were obtained from the base of the skull through the vertex without intravenous contrast. COMPARISON:  09/06/2016 FINDINGS: Brain: No evidence of acute infarction, hemorrhage, hydrocephalus, extra-axial collection or mass lesion/mass effect. There is ventricular and sulcal enlargement reflecting age related volume loss. Patchy white matter  hypoattenuation is noted consistent with moderate chronic microvascular ischemic change. This is stable. Vascular: No hyperdense vessel or unexpected calcification. Skull: Normal. Negative for fracture or focal lesion. Sinuses/Orbits: Visualize globes and orbits are unremarkable. Visualized sinuses and mastoid air cells are clear. Other: None. IMPRESSION: 1. No acute intracranial abnormalities. 2. Age related volume loss and moderate chronic microvascular ischemic change. Stable appearance from the prior study. Electronically Signed   By: Lajean Manes M.D.   On: 09/11/2016 08:20   Dg Hip Unilat With Pelvis 2-3 Views Left  Result Date: 09/11/2016 CLINICAL DATA:  Per EMS, pt coming from [facility]. Staff reported pt fell out of bed. Bed is very low to the ground. Pt has splint brace on left leg EXAM: DG HIP (WITH OR WITHOUT PELVIS) 2-3V LEFT COMPARISON:  09/06/2016 FINDINGS: Since prior exam, a left hip arthroplasty has been placed. The prosthetic components appear well seated and aligned. There is overlying soft tissue swelling presumed postoperative with overlying skin staples. There is no acute fracture. IMPRESSION: 1. No acute fracture. No evidence of disruption of recently placed left hip arthroplasty, which is well aligned. Electronically Signed   By: Lajean Manes M.D.   On: 09/11/2016 08:22   Dg Femur Min 2 Views Left  Result Date: 09/11/2016 CLINICAL DATA:  Per EMS, pt coming from [place]. Staff reported pt fell out of bed. Bed is very low to the ground. Pt has splint brace on left leg EXAM: LEFT FEMUR 2 VIEWS COMPARISON:  None. FINDINGS: No fracture. Left hip prosthesis appears well seated and well aligned with the acetabulum. No evidence of prosthetic loosening. Knee joint is normally spaced and aligned. There is postoperative soft tissue swelling lateral to the left hip with overlying skin staples. Soft tissues otherwise unremarkable. IMPRESSION: 1. No acute fracture or dislocation. No evidence  of disruption of the left hip prosthesis. Electronically Signed   By: Lajean Manes M.D.   On: 09/11/2016 08:24    Procedures Procedures (including critical care time)  Medications Ordered in ED Medications  LORazepam (ATIVAN) tablet 1 mg (1 mg Oral Given 09/11/16 1021)  diphenhydrAMINE (BENADRYL) capsule 50 mg (50 mg Oral  Given 09/11/16 1139)     Initial Impression / Assessment and Plan / ED Course  I have reviewed the triage vital signs and the nursing notes.  Pertinent labs & imaging results that were available during my care of the patient were reviewed by me and considered in my medical decision making (see chart for details).  Clinical Course as of Sep 11 1604  Sun Sep 11, 2016  1406 MCV: 92.5 [DF]    Clinical Course User Index [DF] Deno Etienne, DO   83 year old male who presents from nursing home with complaints of fall this morning. Fall was from bed which is apparently very low to ground. History limited due to patient's baseline dementia. Patient with pre-existing left femur injury. He has a splint in place. No Tenderness or deformity on exam. Plan to CT head for eval for any acute ICH. Will also XR LLE given recent injury for any fracture. Discussed patient with Dr. Tyrone Nine.  Imaging reviewed. CT head negative for any acute process. Left hip x-ray shows the prosthesis is in place with no fracture or dislocation. Discussed patient with Dr. Tyrone Nine.  When attending went to evaluate patient, he was crawling on the floor. He had removed his gown and was attempting to remove his Foley. Unknown if this is related to patient's baseline given history of dementia. Will attempt to call nursing home for further history.  Attempted to call the nursing home where patient is a resident at to obtain additional history about patient's baseline.Marland Kitchen He was transferred to a nurse who states that patient was just recently admitted last night and she does not know much about his history. She attempted to  find another nurse but they hung up before I could talk to anybody. Given lack of history and information about the patient's mental baseline, will check chest x-ray, and basic labs in the emergency department to rule out any infectious etiology. Sitter at bedside for patient safety. 1 mg of Ativan given for patient agitation.  Labs and imaging reviewed. CXR negative for any acute infectious etiology. UA unremarkable for acute UTI. BMP with elevated BUN/creatinine but this appears to be at patient's baseline per review of records. CBC with a significant elevation of white blood cell count. H&H is is low but appears to be at baseline per review of records. Patient stable for discharge at this time. Will plan to transfer back to the nursing home.  Final Clinical Impressions(s) / ED Diagnoses   Final diagnoses:  Fall    New Prescriptions Discharge Medication List as of 09/11/2016  2:12 PM       Volanda Napoleon, PA-C 09/11/16 [place], DO 09/13/16 1127

## 2016-09-11 NOTE — ED Notes (Signed)
Call [facility] for patient transport back to [facility]. Patient  is number four in the transportion waiting list at this time.

## 2016-09-11 NOTE — ED Notes (Signed)
Pt been getting out off the bed. Pt confused .

## 2016-09-11 NOTE — ED Notes (Signed)
Bed: JQ30 Expected date:  Expected time:  Means of arrival:  Comments: 83 yo M/ Fall

## 2016-09-11 NOTE — ED Notes (Signed)
Patient transported to CT 

## 2016-09-20 ENCOUNTER — Observation Stay (HOSPITAL_COMMUNITY)
Admission: EM | Admit: 2016-09-20 | Discharge: 2016-09-22 | Disposition: A | Discharge status: Home / Self Care | Payer: Medicare Other | Attending: Internal Medicine | Admitting: Internal Medicine

## 2016-09-20 ENCOUNTER — Emergency Department (HOSPITAL_COMMUNITY): Payer: Medicare Other

## 2016-09-20 DIAGNOSIS — E872 Acidosis, unspecified: Secondary | ICD-10-CM | POA: Diagnosis present

## 2016-09-20 DIAGNOSIS — Y792 Prosthetic and other implants, materials and accessory orthopedic devices associated with adverse incidents: Secondary | ICD-10-CM | POA: Insufficient documentation

## 2016-09-20 DIAGNOSIS — R627 Adult failure to thrive: Secondary | ICD-10-CM | POA: Insufficient documentation

## 2016-09-20 DIAGNOSIS — E43 Unspecified severe protein-calorie malnutrition: Secondary | ICD-10-CM | POA: Diagnosis not present

## 2016-09-20 DIAGNOSIS — W19XXXA Unspecified fall, initial encounter: Secondary | ICD-10-CM | POA: Diagnosis not present

## 2016-09-20 DIAGNOSIS — N179 Acute kidney failure, unspecified: Secondary | ICD-10-CM | POA: Diagnosis present

## 2016-09-20 DIAGNOSIS — N189 Chronic kidney disease, unspecified: Secondary | ICD-10-CM | POA: Diagnosis present

## 2016-09-20 DIAGNOSIS — K219 Gastro-esophageal reflux disease without esophagitis: Secondary | ICD-10-CM | POA: Diagnosis not present

## 2016-09-20 DIAGNOSIS — Z66 Do not resuscitate: Secondary | ICD-10-CM | POA: Diagnosis not present

## 2016-09-20 DIAGNOSIS — Z79899 Other long term (current) drug therapy: Secondary | ICD-10-CM | POA: Insufficient documentation

## 2016-09-20 DIAGNOSIS — N400 Enlarged prostate without lower urinary tract symptoms: Secondary | ICD-10-CM | POA: Diagnosis not present

## 2016-09-20 DIAGNOSIS — E875 Hyperkalemia: Secondary | ICD-10-CM | POA: Diagnosis present

## 2016-09-20 DIAGNOSIS — S73005A Unspecified dislocation of left hip, initial encounter: Secondary | ICD-10-CM | POA: Diagnosis present

## 2016-09-20 DIAGNOSIS — Z9889 Other specified postprocedural states: Secondary | ICD-10-CM | POA: Diagnosis not present

## 2016-09-20 DIAGNOSIS — E785 Hyperlipidemia, unspecified: Secondary | ICD-10-CM | POA: Insufficient documentation

## 2016-09-20 DIAGNOSIS — Z833 Family history of diabetes mellitus: Secondary | ICD-10-CM | POA: Insufficient documentation

## 2016-09-20 DIAGNOSIS — I129 Hypertensive chronic kidney disease with stage 1 through stage 4 chronic kidney disease, or unspecified chronic kidney disease: Secondary | ICD-10-CM | POA: Diagnosis not present

## 2016-09-20 DIAGNOSIS — Z823 Family history of stroke: Secondary | ICD-10-CM | POA: Diagnosis not present

## 2016-09-20 DIAGNOSIS — E860 Dehydration: Secondary | ICD-10-CM | POA: Diagnosis present

## 2016-09-20 DIAGNOSIS — Z8249 Family history of ischemic heart disease and other diseases of the circulatory system: Secondary | ICD-10-CM | POA: Diagnosis not present

## 2016-09-20 DIAGNOSIS — Z87891 Personal history of nicotine dependence: Secondary | ICD-10-CM | POA: Diagnosis not present

## 2016-09-20 DIAGNOSIS — T84021A Dislocation of internal left hip prosthesis, initial encounter: Secondary | ICD-10-CM

## 2016-09-20 DIAGNOSIS — Z7982 Long term (current) use of aspirin: Secondary | ICD-10-CM | POA: Diagnosis not present

## 2016-09-20 DIAGNOSIS — F0391 Unspecified dementia with behavioral disturbance: Secondary | ICD-10-CM | POA: Diagnosis not present

## 2016-09-20 DIAGNOSIS — I10 Essential (primary) hypertension: Secondary | ICD-10-CM | POA: Diagnosis present

## 2016-09-20 DIAGNOSIS — F03918 Unspecified dementia, unspecified severity, with other behavioral disturbance: Secondary | ICD-10-CM | POA: Diagnosis present

## 2016-09-20 DIAGNOSIS — I480 Paroxysmal atrial fibrillation: Secondary | ICD-10-CM | POA: Diagnosis not present

## 2016-09-20 DIAGNOSIS — D638 Anemia in other chronic diseases classified elsewhere: Secondary | ICD-10-CM | POA: Diagnosis not present

## 2016-09-20 DIAGNOSIS — N184 Chronic kidney disease, stage 4 (severe): Secondary | ICD-10-CM | POA: Diagnosis present

## 2016-09-20 LAB — URINALYSIS, ROUTINE W REFLEX MICROSCOPIC
Bilirubin Urine: NEGATIVE
Glucose, UA: NEGATIVE mg/dL
Ketones, ur: NEGATIVE mg/dL
Leukocytes, UA: NEGATIVE
Nitrite: NEGATIVE
Protein, ur: 100 mg/dL — AB
Specific Gravity, Urine: 1.014 (ref 1.005–1.030)
pH: 5 (ref 5.0–8.0)

## 2016-09-20 LAB — CBC WITH DIFFERENTIAL/PLATELET
Basophils Absolute: 0 10*3/uL (ref 0.0–0.1)
Basophils Relative: 0 %
Eosinophils Absolute: 0 10*3/uL (ref 0.0–0.7)
Eosinophils Relative: 0 %
HCT: 26.4 % — ABNORMAL LOW (ref 39.0–52.0)
Hemoglobin: 8.3 g/dL — ABNORMAL LOW (ref 13.0–17.0)
Lymphocytes Relative: 9 %
Lymphs Abs: 0.7 10*3/uL (ref 0.7–4.0)
MCH: 30.7 pg (ref 26.0–34.0)
MCHC: 31.4 g/dL (ref 30.0–36.0)
MCV: 97.8 fL (ref 78.0–100.0)
Monocytes Absolute: 0.4 10*3/uL (ref 0.1–1.0)
Monocytes Relative: 6 %
Neutro Abs: 6.6 10*3/uL (ref 1.7–7.7)
Neutrophils Relative %: 85 %
Platelets: 321 10*3/uL (ref 150–400)
RBC: 2.7 MIL/uL — ABNORMAL LOW (ref 4.22–5.81)
RDW: 16.2 % — ABNORMAL HIGH (ref 11.5–15.5)
WBC: 7.7 10*3/uL (ref 4.0–10.5)

## 2016-09-20 LAB — COMPREHENSIVE METABOLIC PANEL
ALT: 18 U/L (ref 17–63)
AST: 29 U/L (ref 15–41)
Albumin: 3.3 g/dL — ABNORMAL LOW (ref 3.5–5.0)
Alkaline Phosphatase: 107 U/L (ref 38–126)
Anion gap: 12 (ref 5–15)
BUN: 61 mg/dL — ABNORMAL HIGH (ref 6–20)
CO2: 17 mmol/L — ABNORMAL LOW (ref 22–32)
Calcium: 8.6 mg/dL — ABNORMAL LOW (ref 8.9–10.3)
Chloride: 107 mmol/L (ref 101–111)
Creatinine, Ser: 3.14 mg/dL — ABNORMAL HIGH (ref 0.61–1.24)
GFR calc Af Amer: 19 mL/min — ABNORMAL LOW (ref >60)
GFR calc non Af Amer: 16 mL/min — ABNORMAL LOW (ref >60)
Glucose, Bld: 115 mg/dL — ABNORMAL HIGH (ref 65–99)
Potassium: 5.8 mmol/L — ABNORMAL HIGH (ref 3.5–5.1)
Sodium: 136 mmol/L (ref 135–145)
Total Bilirubin: 1 mg/dL (ref 0.3–1.2)
Total Protein: 7.2 g/dL (ref 6.5–8.1)

## 2016-09-20 LAB — CBC AND DIFFERENTIAL
HCT: 26 % — AB (ref 41–53)
Hemoglobin: 8.3 g/dL — AB (ref 13.5–17.5)
Platelets: 321 10*3/uL (ref 150–399)
WBC: 7.7 10^3/mL

## 2016-09-20 LAB — BASIC METABOLIC PANEL
BUN: 61 mg/dL — AB (ref 4–21)
Creatinine: 3.1 mg/dL — AB (ref 0.6–1.3)
Glucose: 115 mg/dL
Potassium: 5.8 mmol/L — AB (ref 3.4–5.3)
Sodium: 136 mmol/L — AB (ref 137–147)

## 2016-09-20 LAB — HEPATIC FUNCTION PANEL: Bilirubin, Total: 1 mg/dL

## 2016-09-20 MED ORDER — FENTANYL CITRATE (PF) 100 MCG/2ML IJ SOLN
50.0000 ug | Freq: Once | INTRAMUSCULAR | Status: AC
  Administered 2016-09-20: 50 ug via INTRAVENOUS
  Filled 2016-09-20: qty 2

## 2016-09-20 MED ORDER — LORAZEPAM 2 MG/ML IJ SOLN
0.5000 mg | Freq: Once | INTRAMUSCULAR | Status: AC
  Administered 2016-09-20: 0.5 mg via INTRAVENOUS
  Filled 2016-09-20: qty 1

## 2016-09-20 MED ORDER — SODIUM CHLORIDE 0.9 % IV BOLUS (SEPSIS)
500.0000 mL | Freq: Once | INTRAVENOUS | Status: AC
  Administered 2016-09-21: 500 mL via INTRAVENOUS

## 2016-09-20 MED ORDER — PROPOFOL 10 MG/ML IV BOLUS
INTRAVENOUS | Status: AC | PRN
  Administered 2016-09-20: 60 mg via INTRAVENOUS
  Administered 2016-09-20 (×2): 40 mg via INTRAVENOUS

## 2016-09-20 MED ORDER — PROPOFOL 10 MG/ML IV BOLUS
0.5000 mg/kg | Freq: Once | INTRAVENOUS | Status: DC
  Filled 2016-09-20: qty 20

## 2016-09-20 MED ORDER — CEFTRIAXONE SODIUM 1 G IJ SOLR
1.0000 g | Freq: Once | INTRAMUSCULAR | Status: AC
  Administered 2016-09-21: 1 g via INTRAVENOUS
  Filled 2016-09-20: qty 10

## 2016-09-20 NOTE — ED Notes (Signed)
Bed: WA01 Expected date:  Expected time:  Means of arrival:  Comments: EMS 90s fall, hip deformity

## 2016-09-20 NOTE — ED Notes (Signed)
Patient very agitated and trying to Chief Strategy Officer as he is yelling out for [employer]".

## 2016-09-20 NOTE — ED Provider Notes (Signed)
[facility] DEPT Provider Note   CSN: [id number] Arrival date & time: 09/20/16  1834     History   Chief Complaint Chief Complaint  Patient presents with  . Hip Pain  . Fall    HPI Wayne Fuller is a 83 y.o. male.  HPI Patient with recent left hip fracture and operative repair. He presents from nursing home after being found on the floor. Has history of dementia and is unable to contribute.. 5 caveat applies. EMS called and noted patient's left lower extremity be shortened and internally rotated. Patient is complaining of pain at the hip. Past Medical History:  Diagnosis Date  . BPH (benign prostatic hyperplasia)   . Chronic kidney disease    ckd stage 3  . Dementia   . GERD (gastroesophageal reflux disease)   . Hemorrhoids   . History of angina   . Hyperlipemia   . Hypertension   . Irregular heart rhythm   . Paroxysmal A-fib ([place]) 04/02/2015    Patient Active Problem List   Diagnosis Date Noted  . Acute encephalopathy 01/30/2016  . Conjunctivitis of left eye 01/30/2016  . Absolute anemia 01/30/2016  . Acute on chronic renal failure ([place]) 01/30/2016  . Confusion 01/29/2016  . Dementia with behavioral disturbance 01/19/2016  . Back pain 01/19/2016  . Elevated troponin 04/02/2015  . Atrial fibrillation ([place]) 04/02/2015  . CKD (chronic kidney disease) stage 3, GFR 30-59 ml/min 08/11/2014  . GERD (gastroesophageal reflux disease) 08/11/2014  . Chest pain at rest 08/11/2014  . Chest pain 04/03/2014  . Leukocytopenia 04/29/2009  . Essential hypertension 04/29/2009  . CAD (coronary artery disease) 04/29/2009  . CHEST PAIN, PLEURITIC 04/29/2009  . TOBACCO ABUSE, HX OF 04/29/2009    Past Surgical History:  Procedure Laterality Date  . BACK SURGERY    . CERVICAL SPINE SURGERY    . SPINE SURGERY         Home Medications    Prior to Admission medications   Medication Sig Start Date End Date Taking? Authorizing Provider  amiodarone (PACERONE) 200 MG tablet  Take 1 tablet (200 mg total) by mouth daily. 02/29/16   [name], NP  amLODipine (NORVASC) 10 MG tablet Take 1 tablet (10 mg total) by mouth daily. 08/24/16   [name], NP  aspirin EC 81 MG tablet Take 81 mg by mouth daily.    Historical Provider, MD  heparin 5000 UNIT/ML injection Inject 5,000 Units into the skin daily. 09/10/16   Historical Provider, MD  hydrALAZINE (APRESOLINE) 100 MG tablet Take 1 tablet (100 mg total) by mouth 3 (three) times daily. 02/29/16   [name], NP  metoprolol succinate (TOPROL-XL) 25 MG 24 hr tablet Take 1 tablet (25 mg total) by mouth daily. 08/24/16   [name], NP  pantoprazole (PROTONIX) 40 MG tablet Take 40 mg by mouth daily.    Historical Provider, MD  tamsulosin (FLOMAX) 0.4 MG CAPS capsule TAKE 1 CAPSULE(0.4 MG) BY MOUTH DAILY AFTER SUPPER 06/21/16   [name], NP    Family History Family History  Problem Relation Age of Onset  . Stroke Mother   . Hypertension Sister   . Other Son     Homicide, stabbed  . Stroke Son   . Hypertension Son   . Diabetes Son   . Hypertension Son     Social History Social History  Substance Use Topics  . Smoking status: Former Smoker    Packs/day: 0.50    Years: 10.00  Types: Cigarettes  . Smokeless tobacco: Never Used     Comment: quit smoking  around 1996  . Alcohol use No     Comment: hx of occasional ETOH use     Allergies   Patient has no known allergies.   Review of Systems Review of Systems  Unable to perform ROS: Dementia     Physical Exam Updated Vital Signs BP (!) 158/59   Pulse 86   Resp 17   Wt 141 lb 1.5 oz (64 kg)   SpO2 99%   BMI 20.24 kg/m   Physical Exam  Constitutional: He appears well-developed and well-nourished.  Patient is very anxious and appears uncomfortable.  HENT:  Head: Normocephalic and atraumatic.  Mouth/Throat: Oropharynx is clear and moist.  No obvious head injury.  Eyes: EOM are normal. Pupils are equal, round, and  reactive to light.  Neck: Normal range of motion. Neck supple.  No posterior midline cervical tenderness to palpation.  Cardiovascular: Normal rate and regular rhythm.  Exam reveals no gallop and no friction rub.   No murmur heard. Pulmonary/Chest: Effort normal and breath sounds normal. No respiratory distress. He has no wheezes. He has no rales. He exhibits no tenderness.  Abdominal: Soft. Bowel sounds are normal. There is no tenderness. There is no rebound and no guarding.  Musculoskeletal: Normal range of motion. He exhibits tenderness and deformity. He exhibits no edema.  Patient has tenderness to palpation over the left lateral hip. The left lower extremity is shortened and internally rotated. 2+ dorsalis pedis and posterior tibial pulses bilaterally.  Neurological: He is alert.  Patient is oriented to self. Follow simple commands. Moving all extremities except for decreased range of motion of the left hip. Sensation is grossly intact.  Skin: Skin is warm and dry. Capillary refill takes less than 2 seconds. No rash noted. No erythema.  Psychiatric: He has a normal mood and affect. His behavior is normal.  Nursing note and vitals reviewed.    ED Treatments / Results  Labs (all labs ordered are listed, but only abnormal results are displayed) Labs Reviewed  CBC WITH DIFFERENTIAL/PLATELET - Abnormal; Notable for the following:       Result Value   RBC 2.70 (*)    Hemoglobin 8.3 (*)    HCT 26.4 (*)    RDW 16.2 (*)    All other components within normal limits  COMPREHENSIVE METABOLIC PANEL - Abnormal; Notable for the following:    Potassium 5.8 (*)    CO2 17 (*)    Glucose, Bld 115 (*)    BUN 61 (*)    Creatinine, Ser 3.14 (*)    Calcium 8.6 (*)    Albumin 3.3 (*)    GFR calc non Af Amer 16 (*)    GFR calc Af Amer 19 (*)    All other components within normal limits  URINALYSIS, ROUTINE W REFLEX MICROSCOPIC - Abnormal; Notable for the following:    APPearance CLOUDY (*)     Hgb urine dipstick MODERATE (*)    Protein, ur 100 (*)    Bacteria, UA RARE (*)    Squamous Epithelial / LPF 0-5 (*)    All other components within normal limits  URINE CULTURE    EKG  EKG Interpretation None       Radiology Dg Hip Port Unilat W Or Wo Pelvis 1 View Left  Result Date: 09/20/2016 CLINICAL DATA:  Closed manipulation of left hip dislocation EXAM: DG HIP (WITH OR WITHOUT PELVIS)  1V PORT LEFT COMPARISON:  09/20/2016 CT 2030 hours FINDINGS: Satisfactory reduction of left bipolar uncemented hip arthroplasty. No acute fracture identified. IMPRESSION: Satisfactory appearing reduction of the left uncemented bipolar hip arthroplasty allowing for limitations of a frontal view only study. Electronically Signed   By: Ashley Royalty M.D.   On: 09/20/2016 23:46   Dg Hip Unilat W Or Wo Pelvis 2-3 Views Left  Result Date: 09/20/2016 CLINICAL DATA:  Fall at nursing home. Left hip replacement 2 weeks ago. EXAM: DG HIP (WITH OR WITHOUT PELVIS) 2-3V LEFT COMPARISON:  09/11/2016 FINDINGS: Examination demonstrates a unipolar left hip arthroplasty with superolateral dislocation of the prosthesis. Degenerative change of the right hip partially visualized hardware intact over the right femur. Degenerate change of the spine. IMPRESSION: Dislocated left hip prosthesis. Electronically Signed   By: Marin Olp M.D.   On: 09/20/2016 20:45    Procedures Reduction of dislocation Date/Time: 09/20/2016 10:55 PM Performed by: Julianne Rice Authorized by: Lita Mains, DAVID  Consent: Written consent obtained. Consent given by: power of attorney Imaging studies: imaging studies available Patient identity confirmed: arm band Time out: Immediately prior to procedure a "time out" was called to verify the correct patient, procedure, equipment, support staff and site/side marked as required.  Sedation: Patient sedated: yes Sedatives: propofol Sedation start date/time: 09/20/2016 10:55 PM Sedation end  date/time: 09/20/2016 11:04 PM Vitals: Vital signs were monitored during sedation. Patient tolerance: Patient tolerated the procedure well with no immediate complications Comments: Reduction of left hip with traction and countertraction. Good anatomical alignment. Distal pulses intact.    (including critical care time)  Medications Ordered in ED Medications  propofol (DIPRIVAN) 10 mg/mL bolus/IV push 32 mg (32 mg Intravenous See Procedure Record 09/20/16 2310)  cefTRIAXone (ROCEPHIN) 1 g in dextrose 5 % 50 mL IVPB (1 g Intravenous New Bag/Given 09/21/16 0007)  LORazepam (ATIVAN) injection 0.5 mg (0.5 mg Intravenous Given 09/20/16 2003)  fentaNYL (SUBLIMAZE) injection 50 mcg (50 mcg Intravenous Given 09/20/16 2002)  sodium chloride 0.9 % bolus 500 mL (500 mLs Intravenous New Bag/Given 09/21/16 0007)  propofol (DIPRIVAN) 10 mg/mL bolus/IV push (60 mg Intravenous Given 09/20/16 2250)     Initial Impression / Assessment and Plan / ED Course  I have reviewed the triage vital signs and the nursing notes.  Pertinent labs & imaging results that were available during my care of the patient were reviewed by me and considered in my medical decision making (see chart for details).    Unable to get in contact with patient's orthopedic surgeon, Dr. Patrice Paradise. Discussed with Dr. Percell Miller. Okay to attempt reduction emergency department. Successful reduction with IV sedation. Knee immobilizer placed. Patient required 140 mg of propofol to achieve adequate sedation. Patient has chronic renal insufficiency and creatinine and BUN are elevated over baseline. He has a mild elevation in his potassium. Patient also has bicarbonate of 17. This is likely all related to dehydration. Discussed with hospitalist regarding admission. Final Clinical Impressions(s) / ED Diagnoses   Final diagnoses:  Dislocation of left hip, initial encounter ([place])  Dehydration  Hyperkalemia    New Prescriptions New Prescriptions   No medications on  file     Julianne Rice, MD 09/21/16 [phone]

## 2016-09-20 NOTE — Progress Notes (Signed)
RT in room and present during sedation procedure.   Pt stable throughout procedure and on EtCO2 monitoring.

## 2016-09-20 NOTE — ED Triage Notes (Signed)
Per nursing home staff, pt heard pt fall to floor. Pt was was found lying on floor with left leg rotated inward. Pt has left hip replacement.

## 2016-09-21 ENCOUNTER — Encounter (HOSPITAL_COMMUNITY): Payer: Self-pay

## 2016-09-21 DIAGNOSIS — N179 Acute kidney failure, unspecified: Secondary | ICD-10-CM

## 2016-09-21 DIAGNOSIS — E43 Unspecified severe protein-calorie malnutrition: Secondary | ICD-10-CM

## 2016-09-21 DIAGNOSIS — S73005A Unspecified dislocation of left hip, initial encounter: Secondary | ICD-10-CM | POA: Diagnosis not present

## 2016-09-21 DIAGNOSIS — E875 Hyperkalemia: Secondary | ICD-10-CM | POA: Diagnosis present

## 2016-09-21 DIAGNOSIS — E860 Dehydration: Secondary | ICD-10-CM

## 2016-09-21 DIAGNOSIS — N184 Chronic kidney disease, stage 4 (severe): Secondary | ICD-10-CM | POA: Diagnosis not present

## 2016-09-21 DIAGNOSIS — E872 Acidosis, unspecified: Secondary | ICD-10-CM | POA: Diagnosis present

## 2016-09-21 DIAGNOSIS — T84021A Dislocation of internal left hip prosthesis, initial encounter: Secondary | ICD-10-CM

## 2016-09-21 DIAGNOSIS — F0391 Unspecified dementia with behavioral disturbance: Secondary | ICD-10-CM | POA: Diagnosis not present

## 2016-09-21 DIAGNOSIS — I10 Essential (primary) hypertension: Secondary | ICD-10-CM | POA: Diagnosis not present

## 2016-09-21 LAB — BASIC METABOLIC PANEL
Anion gap: 7 (ref 5–15)
BUN: 56 mg/dL — AB (ref 4–21)
BUN: 56 mg/dL — ABNORMAL HIGH (ref 6–20)
CO2: 20 mmol/L — ABNORMAL LOW (ref 22–32)
Calcium: 8.2 mg/dL — ABNORMAL LOW (ref 8.9–10.3)
Chloride: 112 mmol/L — ABNORMAL HIGH (ref 101–111)
Creatinine, Ser: 2.81 mg/dL — ABNORMAL HIGH (ref 0.61–1.24)
Creatinine: 2.8 mg/dL — AB (ref 0.6–1.3)
GFR calc Af Amer: 21 mL/min — ABNORMAL LOW (ref >60)
GFR calc non Af Amer: 18 mL/min — ABNORMAL LOW (ref >60)
Glucose, Bld: 110 mg/dL — ABNORMAL HIGH (ref 65–99)
Glucose: 110 mg/dL
Potassium: 5.7 mmol/L — AB (ref 3.4–5.3)
Potassium: 5.7 mmol/L — ABNORMAL HIGH (ref 3.5–5.1)
Sodium: 139 mmol/L (ref 135–145)
Sodium: 139 mmol/L (ref 137–147)

## 2016-09-21 LAB — CBC
HCT: 21.3 % — ABNORMAL LOW (ref 39.0–52.0)
Hemoglobin: 6.8 g/dL — CL (ref 13.0–17.0)
MCH: 31.3 pg (ref 26.0–34.0)
MCHC: 31.9 g/dL (ref 30.0–36.0)
MCV: 98.2 fL (ref 78.0–100.0)
Platelets: 317 10*3/uL (ref 150–400)
RBC: 2.17 MIL/uL — ABNORMAL LOW (ref 4.22–5.81)
RDW: 15.9 % — ABNORMAL HIGH (ref 11.5–15.5)
WBC: 6.3 10*3/uL (ref 4.0–10.5)

## 2016-09-21 LAB — PREPARE RBC (CROSSMATCH)

## 2016-09-21 LAB — CBC AND DIFFERENTIAL
HCT: 21 % — AB (ref 41–53)
Hemoglobin: 6.8 g/dL — AB (ref 13.5–17.5)
Platelets: 317 10*3/uL (ref 150–399)
WBC: 6.3 10^3/mL

## 2016-09-21 LAB — MRSA PCR SCREENING: MRSA by PCR: NEGATIVE

## 2016-09-21 LAB — ABO/RH: ABO/RH(D): O POS

## 2016-09-21 MED ORDER — SODIUM CHLORIDE 0.9 % IV SOLN
Freq: Once | INTRAVENOUS | Status: AC
  Administered 2016-09-21: 09:00:00 via INTRAVENOUS

## 2016-09-21 MED ORDER — LACTULOSE 10 GM/15ML PO SOLN
20.0000 g | Freq: Every day | ORAL | Status: DC
  Administered 2016-09-21 – 2016-09-22 (×2): 20 g via ORAL
  Filled 2016-09-21 (×2): qty 30

## 2016-09-21 MED ORDER — SODIUM BICARBONATE 8.4 % IV SOLN
INTRAVENOUS | Status: DC
Start: 2016-09-21 — End: 2016-09-21
  Administered 2016-09-21: 04:00:00 via INTRAVENOUS
  Filled 2016-09-21: qty 850

## 2016-09-21 MED ORDER — TAMSULOSIN HCL 0.4 MG PO CAPS
0.4000 mg | ORAL_CAPSULE | Freq: Every day | ORAL | Status: DC
Start: 2016-09-21 — End: 2016-09-22
  Administered 2016-09-21 – 2016-09-22 (×2): 0.4 mg via ORAL
  Filled 2016-09-21 (×2): qty 1

## 2016-09-21 MED ORDER — PANTOPRAZOLE SODIUM 40 MG PO TBEC
40.0000 mg | DELAYED_RELEASE_TABLET | Freq: Every day | ORAL | Status: DC
  Administered 2016-09-21 – 2016-09-22 (×2): 40 mg via ORAL
  Filled 2016-09-21 (×2): qty 1

## 2016-09-21 MED ORDER — AMIODARONE HCL 200 MG PO TABS
200.0000 mg | ORAL_TABLET | Freq: Every day | ORAL | Status: DC
  Administered 2016-09-21 – 2016-09-22 (×2): 200 mg via ORAL
  Filled 2016-09-21 (×2): qty 1

## 2016-09-21 MED ORDER — AMLODIPINE BESYLATE 5 MG PO TABS
10.0000 mg | ORAL_TABLET | Freq: Every day | ORAL | Status: DC
  Administered 2016-09-21 – 2016-09-22 (×2): 10 mg via ORAL
  Filled 2016-09-21 (×3): qty 2

## 2016-09-21 MED ORDER — ENSURE ENLIVE PO LIQD
237.0000 mL | Freq: Two times a day (BID) | ORAL | Status: DC
  Administered 2016-09-22 (×2): 237 mL via ORAL

## 2016-09-21 MED ORDER — SODIUM CHLORIDE 0.9 % IV SOLN
INTRAVENOUS | Status: DC
  Administered 2016-09-21 – 2016-09-22 (×3): via INTRAVENOUS

## 2016-09-21 MED ORDER — SODIUM CHLORIDE 0.9 % IV SOLN
Freq: Once | INTRAVENOUS | Status: AC
  Administered 2016-09-21: 07:00:00 via INTRAVENOUS

## 2016-09-21 MED ORDER — HEPARIN SODIUM (PORCINE) 5000 UNIT/ML IJ SOLN
5000.0000 [IU] | Freq: Three times a day (TID) | INTRAMUSCULAR | Status: DC
  Administered 2016-09-21 – 2016-09-22 (×5): 5000 [IU] via SUBCUTANEOUS
  Filled 2016-09-21 (×4): qty 1

## 2016-09-21 MED ORDER — ASPIRIN EC 81 MG PO TBEC
81.0000 mg | DELAYED_RELEASE_TABLET | Freq: Every day | ORAL | Status: DC
  Administered 2016-09-21 – 2016-09-22 (×2): 81 mg via ORAL
  Filled 2016-09-21 (×2): qty 1

## 2016-09-21 MED ORDER — METOPROLOL SUCCINATE ER 25 MG PO TB24
25.0000 mg | ORAL_TABLET | Freq: Every day | ORAL | Status: DC
Start: 2016-09-21 — End: 2016-09-22
  Administered 2016-09-21 – 2016-09-22 (×2): 25 mg via ORAL
  Filled 2016-09-21 (×2): qty 1

## 2016-09-21 MED ORDER — POLYSACCHARIDE IRON COMPLEX 150 MG PO CAPS
150.0000 mg | ORAL_CAPSULE | Freq: Every day | ORAL | Status: DC
  Administered 2016-09-21 – 2016-09-22 (×2): 150 mg via ORAL
  Filled 2016-09-21 (×2): qty 1

## 2016-09-21 MED ORDER — FUROSEMIDE 10 MG/ML IJ SOLN
20.0000 mg | Freq: Once | INTRAMUSCULAR | Status: AC
  Administered 2016-09-21: 20 mg via INTRAVENOUS
  Filled 2016-09-21: qty 2

## 2016-09-21 MED ORDER — SODIUM BICARBONATE 650 MG PO TABS
650.0000 mg | ORAL_TABLET | Freq: Two times a day (BID) | ORAL | Status: DC
  Administered 2016-09-21 – 2016-09-22 (×3): 650 mg via ORAL
  Filled 2016-09-21 (×3): qty 1

## 2016-09-21 MED ORDER — LORAZEPAM 0.5 MG PO TABS
0.5000 mg | ORAL_TABLET | ORAL | Status: DC | PRN
  Administered 2016-09-21 – 2016-09-22 (×4): 0.5 mg via ORAL
  Filled 2016-09-21 (×5): qty 1

## 2016-09-21 MED ORDER — TRAMADOL HCL 50 MG PO TABS
50.0000 mg | ORAL_TABLET | Freq: Four times a day (QID) | ORAL | Status: DC | PRN
  Administered 2016-09-21 – 2016-09-22 (×4): 50 mg via ORAL
  Filled 2016-09-21 (×4): qty 1

## 2016-09-21 MED ORDER — HYDRALAZINE HCL 50 MG PO TABS
100.0000 mg | ORAL_TABLET | Freq: Three times a day (TID) | ORAL | Status: DC
  Administered 2016-09-21 – 2016-09-22 (×5): 100 mg via ORAL
  Filled 2016-09-21 (×6): qty 2

## 2016-09-21 NOTE — NC FL2 (Deleted)
[place] LEVEL OF CARE SCREENING TOOL     IDENTIFICATION  Patient Name: Wayne Fuller Birthdate: [birth year] Sex: male Admission Date (Current Location): 09/20/2016  [facility] and [place] Number:  Herbalist and Address:  [facility],  [place] [address], [place]      Provider Number: [id number]  Attending Physician Name and Address:  Barton Dubois, MD  Relative Name and Phone Number:       Current Level of Care: Hospital Recommended Level of Care: [place] Prior Approval Number:    Date Approved/Denied:   PASRR Number: [id number]  Discharge Plan: SNF    Current Diagnoses: Patient Active Problem List   Diagnosis Date Noted  . 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 Dislocation of internal left hip prosthesis ([place]) 09/21/2016  . Hyperkalemia 09/21/2016  . Acute encephalopathy 01/30/2016  . Conjunctivitis of left eye 01/30/2016  . Absolute anemia 01/30/2016  . Acute on chronic renal failure ([place]) 01/30/2016  . Confusion 01/29/2016  . Dementia with behavioral disturbance 01/19/2016  . Back pain 01/19/2016  . Elevated troponin 04/02/2015  . Atrial fibrillation ([place]) 04/02/2015  . CKD (chronic kidney disease) stage 4, GFR 15-29 ml/min (HCC) 08/11/2014  . GERD (gastroesophageal reflux disease) 08/11/2014  . Chest pain at rest 08/11/2014  . Chest pain 04/03/2014  . Leukocytopenia 04/29/2009  . Essential hypertension 04/29/2009  . CAD (coronary artery disease) 04/29/2009  . CHEST PAIN, PLEURITIC 04/29/2009  . TOBACCO ABUSE, HX OF 04/29/2009    Orientation RESPIRATION BLADDER Height & Weight        Normal Indwelling catheter Weight: 141 lb 15.6 oz (64.4 kg) Height:  6\' 1"  (185.4 cm)  BEHAVIORAL SYMPTOMS/MOOD NEUROLOGICAL BOWEL NUTRITION STATUS           AMBULATORY STATUS COMMUNICATION OF NEEDS Skin   Extensive Assist Verbally Normal                       Personal  Care Assistance Level of Assistance              Functional Limitations Info             SPECIAL CARE FACTORS FREQUENCY                       Contractures Contractures Info: Not present    Additional Factors Info  Code Status, Allergies Code Status Info: DNR Allergies Info: NKA           Current Medications (09/21/2016):  This is the current hospital active medication list Current Facility-Administered Medications  Medication Dose Route Frequency Provider Last Rate Last Dose  . 0.9 %  sodium chloride infusion   Intravenous Continuous Etta Quill, DO 50 mL/hr at 09/21/16 0400    . 0.9 %  sodium chloride infusion   Intravenous Once Jeryl Columbia, NP      . amiodarone (PACERONE) tablet 200 mg  200 mg Oral Daily Etta Quill, DO   200 mg at 09/21/16 1055  . amLODipine (NORVASC) tablet 10 mg  10 mg Oral Daily Etta Quill, DO   10 mg at 09/21/16 1055  . aspirin EC tablet 81 mg  81 mg Oral Daily Etta Quill, DO   81 mg at 09/21/16 1054  . heparin injection 5,000 Units  5,000 Units Subcutaneous Q8H Etta Quill, DO   5,000 Units at 09/21/16 0753  . hydrALAZINE (  APRESOLINE) tablet 100 mg  100 mg Oral TID Etta Quill, DO   100 mg at 09/21/16 1055  . iron polysaccharides (NIFEREX) capsule 150 mg  150 mg Oral Daily Barton Dubois, MD   150 mg at 09/21/16 1054  . LORazepam (ATIVAN) tablet 0.5 mg  0.5 mg Oral PRN Etta Quill, DO   0.5 mg at 09/21/16 0929  . metoprolol succinate (TOPROL-XL) 24 hr tablet 25 mg  25 mg Oral Daily Etta Quill, DO   25 mg at 09/21/16 1055  . pantoprazole (PROTONIX) EC tablet 40 mg  40 mg Oral Daily Etta Quill, DO   40 mg at 09/21/16 1054  . sodium bicarbonate 150 mEq in sterile water 1,000 mL infusion   Intravenous Continuous Etta Quill, DO 75 mL/hr at 09/21/16 0400    . tamsulosin (FLOMAX) capsule 0.4 mg  0.4 mg Oral Daily Etta Quill, DO   0.4 mg at 09/21/16 1055  . traMADol (ULTRAM) tablet 50 mg  50 mg  Oral Q6H PRN Etta Quill, DO   50 mg at 09/21/16 3335     Discharge Medications: Please see discharge summary for a list of discharge medications.  Relevant Imaging Results:  Relevant Lab Results:   Additional Information SSN [id number]  Burnis Medin, LCSW

## 2016-09-21 NOTE — Progress Notes (Signed)
Patient seen and examined. Admitted after midnight secondary to left hip pain after he was found on the floor at the nursing home where he resides. Patient was found to have a dislocated prosthetic hip, which was reduced and repositioned in the emergency department. Patient expresses no significant pain but on my examination, but difficult to assess secondary to advanced dementia. Currently afebrile and with stable vital signs. Patient was also found with acute on chronic renal failure and with metabolic acidosis, presumed to be associated with dehydration and prerenal azotemia. No signs of infection appreciated at this time. Please refer to H&P written by Dr. Alcario Drought on 09/21/2016 for further info/details on admission.  Plan: -Physical therapy has been order to evaluate/assess patient -IV fluids, electrolytes repletion and initiation of tablet bicarbonates twice a day has been order -Patient was found to have low hemoglobin and a unit of blood has been transfused. -Will follow clinical response. -After discussing with family over the phone, they are interested in switching nursing facility for Mr. Beverly Milch -Social worker is aware and on board.  Barton Dubois [phone]

## 2016-09-21 NOTE — Progress Notes (Signed)
Initial Nutrition Assessment  DOCUMENTATION CODES:   Severe malnutrition in context of chronic illness  INTERVENTION:    Ensure Enlive po BID, each supplement provides 350 kcal and 20 grams of protein  NUTRITION DIAGNOSIS:   Malnutrition (severe) related to chronic illness (CKD, dementia) as evidenced by severe depletion of body fat, severe depletion of muscle mass  GOAL:   Patient will meet greater than or equal to 90% of their needs  MONITOR:   PO intake, Supplement acceptance, Labs, Weight trends, I & O's  REASON FOR ASSESSMENT:   Malnutrition Screening Tool  ASSESSMENT:   83 y.o. Male with medical history significant of CKD stage 4 with baseline creatinine in the mid to upper 2s for the past year, advanced dementia, frequent falls at the SNF which ultimately caused L hip fx last month.  Repair of L hip Fx.  Patient presents to the ED after being found on the floor at SNF.  He is unable to contribute to history due to dementia.  Patients LLE was shortened and internally rotated.  RD unable to obtain nutrition hx from patient. Per Malnutrition Screening Tool, pt has been eating poorly because of a decreased appetite. Also has recently lost wt without trying.  Per wt readings below, pt has had a 7% wt loss since 02/2016.  Not significant for time frame. Labs reviewed.  Potassium 5.7 (H). Medications reviewed.  Nutrition-Focused physical exam completed. Findings are severe fat depletion, severe muscle depletion, and no edema.   Diet Order:  DIET SOFT Room service appropriate? Yes; Fluid consistency: Thin  Skin:  Reviewed, no issues  Last BM:  N/A  Height:   Ht Readings from Last 1 Encounters:  09/21/16 6\' 1"  (1.854 m)   Weight:   Wt Readings from Last 1 Encounters:  09/21/16 141 lb 15.6 oz (64.4 kg)   Wt Readings from Last 15 Encounters:  09/21/16 141 lb 15.6 oz (64.4 kg)  07/11/16 141 lb 9.6 oz (64.2 kg)  06/23/16 138 lb (62.6 kg)  06/06/16 135 lb 3.2 oz  (61.3 kg)  06/06/16 135 lb 3.2 oz (61.3 kg)  03/03/16 152 lb (68.9 kg)  01/29/16 156 lb (70.8 kg)  01/19/16 156 lb 9.6 oz (71 kg)  01/11/16 150 lb (68 kg)  04/03/15 160 lb 3.2 oz (72.7 kg)  08/11/14 158 lb 15.2 oz (72.1 kg)  04/05/14 153 lb 10.6 oz (69.7 kg)  07/25/13 160 lb (72.6 kg)  12/11/12 170 lb (77.1 kg)  05/05/09 183 lb (83 kg)   Ideal Body Weight:  81 kg  BMI:  Body mass index is 18.73 kg/m.  Estimated Nutritional Needs:   Kcal:  1600-1800  Protein:  80-90 gm  Fluid:  1.6-1.8 L  EDUCATION NEEDS:   No education needs identified at this time  Arthur Holms, RD, LDN Pager #: [phone] After-Hours Pager #: [phone]

## 2016-09-21 NOTE — H&P (Signed)
History and Physical    Wayne Fuller ZOX:[id number] DOB: 1923-11-23 DOA: 09/20/2016  PCP: [name], NP  Patient coming from: SNF  I have personally briefly reviewed patient's old medical records in [place]  Chief Complaint: Hip pain  HPI: Wayne Fuller is a 83 y.o. male with medical history significant of CKD stage 4 with baseline creatinine in the mid to upper 2s for the past year, advanced dementia, frequent falls at the SNF which ultimately caused L hip fx last month.  Repair of L hip Fx.  Patient presents to the ED after being found on the floor at SNF.  He is unable to contribute to history due to dementia.  Patients LLE was shortened and internally rotated.   ED Course: Found to have dislocation of LLE hip prosthesis, this was successfully reduced by the EDP.  Patients other labs were significant for K of 5.8, creat of 3.1 up from 2.3 a week ago, BUN 61 up from his baseline 30-40.  Bicarb of 17.   Review of Systems: Unable to perform due to dementia.  Past Medical History:  Diagnosis Date  . BPH (benign prostatic hyperplasia)   . Chronic kidney disease    ckd stage 3  . Dementia   . GERD (gastroesophageal reflux disease)   . Hemorrhoids   . History of angina   . Hyperlipemia   . Hypertension   . Irregular heart rhythm   . Paroxysmal A-fib ([place]) 04/02/2015    Past Surgical History:  Procedure Laterality Date  . BACK SURGERY    . CERVICAL SPINE SURGERY    . SPINE SURGERY       reports that he has quit smoking. His smoking use included Cigarettes. He has a 5.00 pack-year smoking history. He has never used smokeless tobacco. He reports that he does not drink alcohol or use drugs.  No Known Allergies  Family History  Problem Relation Age of Onset  . Stroke Mother   . Hypertension Sister   . Other Son     Homicide, stabbed  . Stroke Son   . Hypertension Son   . Diabetes Son   . Hypertension Son      Prior to Admission medications     Medication Sig Start Date End Date Taking? Authorizing Provider  amiodarone (PACERONE) 200 MG tablet Take 1 tablet (200 mg total) by mouth daily. 02/29/16  Yes [name], NP  amLODipine (NORVASC) 10 MG tablet Take 1 tablet (10 mg total) by mouth daily. 08/24/16  Yes [name], NP  aspirin EC 81 MG tablet Take 81 mg by mouth daily.   Yes Historical Provider, MD  heparin 5000 UNIT/ML injection Inject 5,000 Units into the skin daily. 09/10/16  Yes Historical Provider, MD  hydrALAZINE (APRESOLINE) 100 MG tablet Take 1 tablet (100 mg total) by mouth 3 (three) times daily. 02/29/16  Yes [name], NP  LORazepam (ATIVAN) 0.5 MG tablet Take 0.5 mg by mouth as needed (agitation).   Yes Historical Provider, MD  metoprolol succinate (TOPROL-XL) 25 MG 24 hr tablet Take 1 tablet (25 mg total) by mouth daily. 08/24/16  Yes [name], NP  pantoprazole (PROTONIX) 40 MG tablet Take 40 mg by mouth daily.   Yes Historical Provider, MD  tamsulosin (FLOMAX) 0.4 MG CAPS capsule TAKE 1 CAPSULE(0.4 MG) BY MOUTH DAILY AFTER SUPPER 06/21/16  Yes [name], NP  traMADol (ULTRAM) 50 MG tablet Take 50 mg by mouth every 6 (six)  hours as needed for moderate pain.   Yes Historical Provider, MD    Physical Exam: Vitals:   09/20/16 2255 09/20/16 2302 09/21/16 0001 09/21/16 0036  BP: (!) 142/89 138/71 (!) 158/59 (!) 158/49  Pulse: (!) 30 83 86 81  Resp: (!) 25 20 17 18   SpO2: (!) 74% 100% 99% 99%  Weight:        Constitutional: NAD, calm, comfortable Eyes: PERRL, lids and conjunctivae normal ENMT: Mucous membranes are moist. Posterior pharynx clear of any exudate or lesions.Normal dentition.  Neck: normal, supple, no masses, no thyromegaly Respiratory: clear to auscultation bilaterally, no wheezing, no crackles. Normal respiratory effort. No accessory muscle use.  Cardiovascular: Regular rate and rhythm, no murmurs / rubs / gallops. No extremity edema. 2+ pedal pulses. No carotid bruits.   Abdomen: no tenderness, no masses palpated. No hepatosplenomegaly. Bowel sounds positive.  Musculoskeletal: no clubbing / cyanosis. No joint deformity upper and lower extremities. Good ROM, no contractures. Normal muscle tone.  Skin: no rashes, lesions, ulcers. No induration Neurologic: CN 2-12 grossly intact. Sensation intact, DTR normal. Strength 5/5 in all 4.  Psychiatric: Normal judgment and insight. Alert and oriented x 3. Normal mood.    Labs on Admission: I have personally reviewed following labs and imaging studies  CBC:  Recent Labs Lab 09/20/16 1958  WBC 7.7  NEUTROABS 6.6  HGB 8.3*  HCT 26.4*  MCV 97.8  PLT 762   Basic Metabolic Panel:  Recent Labs Lab 09/20/16 1958  NA 136  K 5.8*  CL 107  CO2 17*  GLUCOSE 115*  BUN 61*  CREATININE 3.14*  CALCIUM 8.6*   GFR: Estimated Creatinine Clearance: 14.2 mL/min (A) (by C-G formula based on SCr of 3.14 mg/dL (H)). Liver Function Tests:  Recent Labs Lab 09/20/16 1958  AST 29  ALT 18  ALKPHOS 107  BILITOT 1.0  PROT 7.2  ALBUMIN 3.3*   No results for input(s): LIPASE, AMYLASE in the last 168 hours. No results for input(s): AMMONIA in the last 168 hours. Coagulation Profile: No results for input(s): INR, PROTIME in the last 168 hours. Cardiac Enzymes: No results for input(s): CKTOTAL, CKMB, CKMBINDEX, TROPONINI in the last 168 hours. BNP (last 3 results) No results for input(s): PROBNP in the last 8760 hours. HbA1C: No results for input(s): HGBA1C in the last 72 hours. CBG: No results for input(s): GLUCAP in the last 168 hours. Lipid Profile: No results for input(s): CHOL, HDL, LDLCALC, TRIG, CHOLHDL, LDLDIRECT in the last 72 hours. Thyroid Function Tests: No results for input(s): TSH, T4TOTAL, FREET4, T3FREE, THYROIDAB in the last 72 hours. Anemia Panel: No results for input(s): VITAMINB12, FOLATE, FERRITIN, TIBC, IRON, RETICCTPCT in the last 72 hours. Urine analysis:    Component Value Date/Time    COLORURINE YELLOW 09/20/2016 2147   APPEARANCEUR CLOUDY (A) 09/20/2016 2147   LABSPEC 1.014 09/20/2016 2147   PHURINE 5.0 09/20/2016 2147   GLUCOSEU NEGATIVE 09/20/2016 2147   HGBUR MODERATE (A) 09/20/2016 2147   BILIRUBINUR NEGATIVE 09/20/2016 2147   KETONESUR NEGATIVE 09/20/2016 2147   PROTEINUR 100 (A) 09/20/2016 2147   UROBILINOGEN 0.2 04/02/2015 1430   NITRITE NEGATIVE 09/20/2016 2147   LEUKOCYTESUR NEGATIVE 09/20/2016 2147    Radiological Exams on Admission: Dg Hip Port Unilat W Or Wo Pelvis 1 View Left  Result Date: 09/20/2016 CLINICAL DATA:  Closed manipulation of left hip dislocation EXAM: DG HIP (WITH OR WITHOUT PELVIS) 1V PORT LEFT COMPARISON:  09/20/2016 CT 2030 hours FINDINGS: Satisfactory reduction of left  bipolar uncemented hip arthroplasty. No acute fracture identified. IMPRESSION: Satisfactory appearing reduction of the left uncemented bipolar hip arthroplasty allowing for limitations of a frontal view only study. Electronically Signed   By: Ashley Royalty M.D.   On: 09/20/2016 23:46   Dg Hip Unilat W Or Wo Pelvis 2-3 Views Left  Result Date: 09/20/2016 CLINICAL DATA:  Fall at nursing home. Left hip replacement 2 weeks ago. EXAM: DG HIP (WITH OR WITHOUT PELVIS) 2-3V LEFT COMPARISON:  09/11/2016 FINDINGS: Examination demonstrates a unipolar left hip arthroplasty with superolateral dislocation of the prosthesis. Degenerative change of the right hip partially visualized hardware intact over the right femur. Degenerate change of the spine. IMPRESSION: Dislocated left hip prosthesis. Electronically Signed   By: Marin Olp M.D.   On: 09/20/2016 20:45    EKG: Independently reviewed.  Assessment/Plan Principal Problem:   Acute on chronic renal failure (HCC) Active Problems:   Essential hypertension   CKD (chronic kidney disease) stage 4, GFR 15-29 ml/min (HCC)   Dementia with behavioral disturbance   Metabolic acidosis, NAG, failure of bicarbonate regeneration    Dislocation of internal left hip prosthesis (HCC)   Hyperkalemia    1. AKI on CKD stage 4 - suspect pre-renal due to dehydration at this point and will try and gently hydrate.  Alternatively could just represent progression of CKD. 1. IVF: NS at 50 cc/hr and isotonic bicarb at 75 cc/hr 2. Repeat BMP in AM 3. No emergent indications for dialysis, and I dont really think that nephrology would be anxious to dialyze this 83 yo patient with advanced dementia who is DNR and has adult failure to thrive at baseline.  Therefore will keep patient at [facility] at this point 2. Mild hyperkalemia - 1. as above, IVF and bicarb 2. repeat BMP in AM 3. Tele monitor 3. Mild NAG metabolic acidosis - 1. Bicarb as above 2. Repeat BMP in AM 4. Hip prosthesis dislocation - resolved by EDP 5. Dementia - continue home meds 6. HTN - continue home meds  DVT prophylaxis: Heparin [place] Code Status: DNR Family Communication: No family in room Disposition Plan: Back to SNF after obs stay Consults called: None Admission status: Place in obs   [place], [place] [facility] Pager [phone]  If 7AM-7PM, please contact day team taking care of patient [email or web address] Password [email or web address]  09/21/2016, 1:38 AM

## 2016-09-21 NOTE — Progress Notes (Signed)
CSW spoke with patient's son Atzel Mccambridge [phone]) at bedside. Patient's son reported that he and his other siblings decided that they want the patient to go to a different SNF due to patient falling multiple times at current SNF. Patient's son reported that they are interested in [facility] their first preference and are willing to consider other facilities if [facility] is not available. CSW confirmed this plan with patient's other son Iban Utz [phone]). CSW attempted to confirm plan with patient's daughter Allena Earing [phone]) no answer, CSW left voicemail. CSW started SNF placement process and reached out to [facility] SNF, awaiting return call. CSW provided update to patient's sons.

## 2016-09-21 NOTE — Clinical Social Work Note (Signed)
Clinical Social Work Assessment  Patient Details  Name: Wayne Fuller MRN: [id number] Date of Birth: [birth year]  Date of referral:  09/21/16               Reason for consult:  Facility Placement                Permission sought to share information with:  Facility Art therapist granted to share information::  Yes, Verbal Permission Granted  Name::        Agency::     Relationship::     Contact Information:     Housing/Transportation Living arrangements for the past 2 months:  [place] of Information:  Adult Children (Patient's Son - Norvell Caswell) Patient Interpreter Needed:  None Criminal Activity/Legal Involvement Pertinent to Current Situation/Hospitalization:  No - Comment as needed Significant Relationships:  Adult Children Lives with:  Facility Resident Do you feel safe going back to the place where you live?  Yes Need for family participation in patient care:  Yes (Comment) (Patient's Son Camp Gopal)  Care giving concerns:  Patient from [facility] SN[facility].    Social Worker assessment / plan:  CSW spoke with patient's son Jadarion Halbig [phone]) via telephone. Patient disoriented unable to participate in assessment. Patient's son reported that patient has been at [facility] for approx. 10 days after encountering a fall at home and going through surgery. Patient's son reported that patient was staying at home with his grandson and independent in completing most ADLs prior to going to [facility] for ST rehab. Patient's son reported that patient attempts to get up although he is not able to and has encountered multiple falls at [facility]. CSW and patient's son discussed observation of patient. Patient's son reported concerns about patient not being observed enough and the falls he has been having, CSW validated patient's son's concerns. CSW agreed to provide patient's son with a list of private duty care agencies so he could  inquire about getting additional help with his father at the SNF. Patient's son reported that he wants his father to return to current SNF at this time. CSW will complete SNF placement process and follow up with facility.   Employment status:  Retired Nurse, adult PT Recommendations:  Not assessed at this time Information / Referral to community resources:  [place]  Patient/Family's Response to care:  Patient's son agreeable with patient's return back to SNF for [place] rehab.   Patient/Family's Understanding of and Emotional Response to Diagnosis, Current Treatment, and Prognosis:  Patient's son expressed frustration and concerns about patient's multiple falls at facility, noting that patient had recent surgery and the falls are causing more damage. CSW validated patient's feelings. Patient's son reported that the plan is for patient to finish ST rehab at [facility] and that the family is working on a plan from there.   Emotional Assessment Appearance:    Attitude/Demeanor/Rapport:  Unable to Assess Affect (typically observed):  Unable to Assess Orientation:    Alcohol / Substance use:  Not Applicable Psych involvement (Current and /or in the community):  No (Comment)  Discharge Needs  Concerns to be addressed:  Care Coordination Readmission within the last 30 days:  No Current discharge risk:  None Barriers to Discharge:  No Barriers Identified   Burnis Medin, LCSW 09/21/2016, 12:08 PM

## 2016-09-21 NOTE — Progress Notes (Signed)
CRITICAL VALUE STICKER  CRITICAL VALUE: Hgb 6.8  RECEIVER (on-site recipient of call): Fausto Skillern RN  [place] NOTIFIED: 09/21/2016 0536  MESSENGER (representative from lab):  MD NOTIFIED: K. Schorr   TIME OF NOTIFICATION: 0539  RESPONSE: new orders noted

## 2016-09-21 NOTE — NC FL2 (Signed)
[place] LEVEL OF CARE SCREENING TOOL     IDENTIFICATION  Patient Name: Wayne Fuller Birthdate: 1924/04/27 Sex: male Admission Date (Current Location): 09/20/2016  [facility] and [place] Number:  Herbalist and Address:  [facility],  [place] [address], [place]      Provider Number: [id number]  Attending Physician Name and Address:  Barton Dubois, MD  Relative Name and Phone Number:       Current Level of Care: Hospital Recommended Level of Care: [place] Prior Approval Number:    Date Approved/Denied:   PASRR Number: [id number]  Discharge Plan: SNF    Current Diagnoses: Patient Active Problem List   Diagnosis Date Noted  . 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 Dislocation of internal left hip prosthesis ([place]) 09/21/2016  . Hyperkalemia 09/21/2016  . Protein-calorie malnutrition, severe 09/21/2016  . Acute encephalopathy 01/30/2016  . Conjunctivitis of left eye 01/30/2016  . Absolute anemia 01/30/2016  . Acute on chronic renal failure ([place]) 01/30/2016  . Confusion 01/29/2016  . Dementia with behavioral disturbance 01/19/2016  . Back pain 01/19/2016  . Elevated troponin 04/02/2015  . Atrial fibrillation ([place]) 04/02/2015  . CKD (chronic kidney disease) stage 4, GFR 15-29 ml/min (HCC) 08/11/2014  . GERD (gastroesophageal reflux disease) 08/11/2014  . Chest pain at rest 08/11/2014  . Chest pain 04/03/2014  . Leukocytopenia 04/29/2009  . Essential hypertension 04/29/2009  . CAD (coronary artery disease) 04/29/2009  . CHEST PAIN, PLEURITIC 04/29/2009  . TOBACCO ABUSE, HX OF 04/29/2009    Orientation RESPIRATION BLADDER Height & Weight        Normal External catheter Weight: 141 lb 15.6 oz (64.4 kg) Height:  6\' 1"  (185.4 cm)  BEHAVIORAL SYMPTOMS/MOOD NEUROLOGICAL BOWEL NUTRITION STATUS        Diet (Soft)  AMBULATORY STATUS COMMUNICATION OF NEEDS Skin   Extensive  Assist Verbally Normal                       Personal Care Assistance Level of Assistance  Bathing, Feeding, Dressing   Maximum Assistance         Functional Limitations Info             SPECIAL CARE FACTORS FREQUENCY  PT (By licensed PT), OT (By licensed OT)     PT Frequency: 5x/week OT Frequency: 5x/week            Contractures Contractures Info: Not present    Additional Factors Info  Code Status, Allergies Code Status Info: DNR Allergies Info: NKA           Current Medications (09/21/2016):  This is the current hospital active medication list Current Facility-Administered Medications  Medication Dose Route Frequency Provider Last Rate Last Dose  . 0.9 %  sodium chloride infusion   Intravenous Continuous Barton Dubois, MD 50 mL/hr at 09/21/16 0400    . 0.9 %  sodium chloride infusion   Intravenous Once Jeryl Columbia, NP      . amiodarone (PACERONE) tablet 200 mg  200 mg Oral Daily Etta Quill, DO   200 mg at 09/21/16 1055  . amLODipine (NORVASC) tablet 10 mg  10 mg Oral Daily Etta Quill, DO   10 mg at 09/21/16 1055  . aspirin EC tablet 81 mg  81 mg Oral Daily Etta Quill, DO   81 mg at 09/21/16 1054  . feeding supplement (ENSURE ENLIVE) (ENSURE ENLIVE) liquid  237 mL  237 mL Oral BID BM Barton Dubois, MD      . heparin injection 5,000 Units  5,000 Units Subcutaneous Q8H Etta Quill, DO   5,000 Units at 09/21/16 1427  . hydrALAZINE (APRESOLINE) tablet 100 mg  100 mg Oral TID Etta Quill, DO   100 mg at 09/21/16 1055  . iron polysaccharides (NIFEREX) capsule 150 mg  150 mg Oral Daily Barton Dubois, MD   150 mg at 09/21/16 1054  . lactulose (CHRONULAC) 10 GM/15ML solution 20 g  20 g Oral Daily Barton Dubois, MD      . LORazepam (ATIVAN) tablet 0.5 mg  0.5 mg Oral PRN Etta Quill, DO   0.5 mg at 09/21/16 0929  . metoprolol succinate (TOPROL-XL) 24 hr tablet 25 mg  25 mg Oral Daily Etta Quill, DO   25 mg at 09/21/16 1055  .  pantoprazole (PROTONIX) EC tablet 40 mg  40 mg Oral Daily Etta Quill, DO   40 mg at 09/21/16 1054  . sodium bicarbonate tablet 650 mg  650 mg Oral BID Barton Dubois, MD      . tamsulosin [facility]) capsule 0.4 mg  0.4 mg Oral Daily Etta Quill, DO   0.4 mg at 09/21/16 1055  . traMADol (ULTRAM) tablet 50 mg  50 mg Oral Q6H PRN Etta Quill, DO   50 mg at 09/21/16 9872     Discharge Medications: Please see discharge summary for a list of discharge medications.  Relevant Imaging Results:  Relevant Lab Results:   Additional Information SSN [id number]  Burnis Medin, LCSW

## 2016-09-21 NOTE — Evaluation (Signed)
Physical Therapy Evaluation Patient Details Name: Wayne Fuller MRN: [id number] DOB: 10-Mar-1924 Today's Date: 09/21/2016   History of Present Illness  Pt is a 83 year old male with hx of HTN, afib, dementia as well as recent L hip hemiarthroplasty s/p fall with fracture.  Pt was discharged to SNF s/p hip surgery and presented to [facility] s/p fall and found to have dislocation of LLE hip prosthesis which was successfully reduced by the EDP  Clinical Impression  Pt admitted with above diagnosis. Pt currently with functional limitations due to the deficits listed below (see PT Problem List).  Pt will benefit from skilled PT to increase their independence and safety with mobility to allow discharge to the venue listed below.  Pt assisted to standing and then returned to supine.  Pt does attempt to assist and follow multimodal cues.  Pt with advanced dementia and needs to maintain posterior hip precautions at this time.  Family would like pt to return to a different SNF due to multiple falls.     Follow Up Recommendations SNF;Supervision/Assistance - 24 hour    Equipment Recommendations  None recommended by PT    Recommendations for Other Services       Precautions / Restrictions Precautions Precautions: Fall;Posterior Hip Precaution Comments: posterior hip precautions per [facility] therapy notes Required Braces or Orthoses: Knee Immobilizer - Left Restrictions Weight Bearing Restrictions: No Other Position/Activity Restrictions: WBAT per [facility] therapy notes      Mobility  Bed Mobility Overal bed mobility: Needs Assistance Bed Mobility: Supine to Sit;Sit to Supine     Supine to sit: Max assist;HOB elevated;+2 for physical assistance Sit to supine: Mod assist;+2 for physical assistance   General bed mobility comments: multimodal cues for technique, assist for LEs over EOB and trunk upright, son providing stand by assist, assist for LEs onto bed, maintained hip precautions  Transfers Overall  transfer level: Needs assistance Equipment used: Rolling walker (2 wheeled) Transfers: Sit to/from Stand Sit to Stand: Mod assist;+2 safety/equipment         General transfer comment: multimodal cues for technique, assist to rise and steady as well as control descent, pt stood EOB for approx 3 minutes -urinated into condom catheter (son provided standby assist)  Ambulation/Gait             General Gait Details: deferred at this time for safety  Stairs            Wheelchair Mobility    Modified Rankin (Stroke Patients Only)       Balance Overall balance assessment: Needs assistance;History of Falls         Standing balance support: Bilateral upper extremity supported;During functional activity Standing balance-Leahy Scale: Zero Standing balance comment: requires UE support and steadying assist                             Pertinent Vitals/Pain Pain Assessment: Faces Faces Pain Scale: Hurts even more Pain Location: L hip Pain Descriptors / Indicators: Guarding;Grimacing Pain Intervention(s): Monitored during session;Repositioned    Home Living Family/patient expects to be discharged to:: Skilled nursing facility                      Prior Function Level of Independence: Needs assistance   Gait / Transfers Assistance Needed: s/p falls with d/c to SNF for rehab, has also been falling at SNF - family would like new SNF placement  Hand Dominance        Extremity/Trunk Assessment   Upper Extremity Assessment Upper Extremity Assessment: Generalized weakness    Lower Extremity Assessment Lower Extremity Assessment: Generalized weakness;LLE deficits/detail LLE Deficits / Details: assist required for L LE, maintained posterior hip precautions, maintained KI due to dementia       Communication   Communication: No difficulties  Cognition Arousal/Alertness: Awake/alert Behavior During Therapy: Restless Overall Cognitive  Status: History of cognitive impairments - at baseline (advanced dementia)                                 General Comments: initially restless however son reports pt wants to urinate standing, resting quietly in bed end of session      General Comments      Exercises     Assessment/Plan    PT Assessment Patient needs continued PT services  PT Problem List Decreased strength;Decreased activity tolerance;Decreased knowledge of use of DME;Decreased balance;Pain;Decreased knowledge of precautions;Decreased mobility;Decreased cognition;Decreased safety awareness       PT Treatment Interventions DME instruction;Gait training;Therapeutic activities;Therapeutic exercise;Functional mobility training;Balance training;Patient/family education    PT Goals (Current goals can be found in the Care Plan section)  Acute Rehab PT Goals PT Goal Formulation: With family Time For Goal Achievement: 09/28/16 Potential to Achieve Goals: Fair    Frequency Min 2X/week   Barriers to discharge        Co-evaluation               AM-PAC PT "6 Clicks" Daily Activity  Outcome Measure Difficulty turning over in bed (including adjusting bedclothes, sheets and blankets)?: A Lot Difficulty moving from lying on back to sitting on the side of the bed? : Total Difficulty sitting down on and standing up from a chair with arms (e.g., wheelchair, bedside commode, etc,.)?: A Lot Help needed moving to and from a bed to chair (including a wheelchair)?: A Lot Help needed walking in hospital room?: Total Help needed climbing 3-5 steps with a railing? : Total 6 Click Score: 9    End of Session Equipment Utilized During Treatment: Gait belt;Left knee immobilizer Activity Tolerance: Patient limited by fatigue Patient left: in bed;with call bell/phone within reach;with family/visitor present;with bed alarm set Nurse Communication: Mobility status PT Visit Diagnosis: Unsteadiness on feet  (R26.81)    Time: 3299-2426 PT Time Calculation (min) (ACUTE ONLY): 19 min   Charges:   PT Evaluation $PT Eval Moderate Complexity: 1 Procedure     PT G Codes:   PT G-Codes **NOT FOR INPATIENT CLASS** Functional Assessment Tool Used: AM-PAC 6 Clicks Basic Mobility;Clinical judgement Functional Limitation: Mobility: Walking and moving around Mobility: Walking and Moving Around Current Status (S3419): At least 80 percent but less than 100 percent impaired, limited or restricted Mobility: Walking and Moving Around Goal Status [phone]): At least 40 percent but less than 60 percent impaired, limited or restricted    Carmelia Bake, PT, DPT 09/21/2016 Pager: [phone]   York Ram E 09/21/2016, 3:20 PM

## 2016-09-21 NOTE — Progress Notes (Signed)
Telemetry showed 7 beats of v tach. Pt resting, no distress.  MD notified, will continue to monitor.

## 2016-09-22 DIAGNOSIS — T84021D Dislocation of internal left hip prosthesis, subsequent encounter: Secondary | ICD-10-CM | POA: Diagnosis not present

## 2016-09-22 DIAGNOSIS — E872 Acidosis: Secondary | ICD-10-CM | POA: Diagnosis not present

## 2016-09-22 DIAGNOSIS — N184 Chronic kidney disease, stage 4 (severe): Secondary | ICD-10-CM | POA: Diagnosis not present

## 2016-09-22 DIAGNOSIS — I10 Essential (primary) hypertension: Secondary | ICD-10-CM | POA: Diagnosis not present

## 2016-09-22 DIAGNOSIS — F0391 Unspecified dementia with behavioral disturbance: Secondary | ICD-10-CM

## 2016-09-22 DIAGNOSIS — N179 Acute kidney failure, unspecified: Secondary | ICD-10-CM | POA: Diagnosis not present

## 2016-09-22 DIAGNOSIS — E860 Dehydration: Secondary | ICD-10-CM

## 2016-09-22 DIAGNOSIS — E875 Hyperkalemia: Secondary | ICD-10-CM | POA: Diagnosis not present

## 2016-09-22 DIAGNOSIS — E43 Unspecified severe protein-calorie malnutrition: Secondary | ICD-10-CM | POA: Diagnosis not present

## 2016-09-22 LAB — CBC
HCT: 21.4 % — ABNORMAL LOW (ref 39.0–52.0)
Hemoglobin: 7.1 g/dL — ABNORMAL LOW (ref 13.0–17.0)
MCH: 31.7 pg (ref 26.0–34.0)
MCHC: 33.2 g/dL (ref 30.0–36.0)
MCV: 95.5 fL (ref 78.0–100.0)
Platelets: 293 10*3/uL (ref 150–400)
RBC: 2.24 MIL/uL — ABNORMAL LOW (ref 4.22–5.81)
RDW: 18.3 % — ABNORMAL HIGH (ref 11.5–15.5)
WBC: 5.3 10*3/uL (ref 4.0–10.5)

## 2016-09-22 LAB — URINE CULTURE: Culture: NO GROWTH

## 2016-09-22 LAB — BASIC METABOLIC PANEL
Anion gap: 6 (ref 5–15)
BUN: 38 mg/dL — AB (ref 4–21)
BUN: 38 mg/dL — ABNORMAL HIGH (ref 6–20)
CO2: 23 mmol/L (ref 22–32)
Calcium: 7.9 mg/dL — ABNORMAL LOW (ref 8.9–10.3)
Chloride: 112 mmol/L — ABNORMAL HIGH (ref 101–111)
Creatinine, Ser: 2.39 mg/dL — ABNORMAL HIGH (ref 0.61–1.24)
Creatinine: 2.4 mg/dL — AB (ref 0.6–1.3)
GFR calc Af Amer: 26 mL/min — ABNORMAL LOW (ref >60)
GFR calc non Af Amer: 22 mL/min — ABNORMAL LOW (ref >60)
Glucose, Bld: 82 mg/dL (ref 65–99)
Glucose: 82 mg/dL
Potassium: 5 mmol/L (ref 3.5–5.1)
Sodium: 141 mmol/L (ref 135–145)
Sodium: 141 mmol/L (ref 137–147)

## 2016-09-22 LAB — MAGNESIUM: Magnesium: 1.9 mg/dL (ref 1.7–2.4)

## 2016-09-22 LAB — CBC AND DIFFERENTIAL: WBC: 5.3 10^3/mL

## 2016-09-22 MED ORDER — POLYSACCHARIDE IRON COMPLEX 150 MG PO CAPS: 150.0000 mg | ORAL_CAPSULE | Freq: Every day | ORAL | Status: DC

## 2016-09-22 MED ORDER — LORAZEPAM 0.5 MG PO TABS: 0.5000 mg | ORAL_TABLET | Freq: Three times a day (TID) | ORAL | 0 refills | Status: DC | PRN

## 2016-09-22 MED ORDER — ENSURE ENLIVE PO LIQD: 237.0000 mL | Freq: Two times a day (BID) | ORAL | Status: DC

## 2016-09-22 MED ORDER — SODIUM BICARBONATE 650 MG PO TABS: 650.0000 mg | ORAL_TABLET | Freq: Two times a day (BID) | ORAL | Status: DC

## 2016-09-22 MED ORDER — TRAMADOL HCL 50 MG PO TABS: 50.0000 mg | ORAL_TABLET | Freq: Four times a day (QID) | ORAL | 0 refills | Status: DC | PRN

## 2016-09-22 NOTE — Clinical Social Work Placement (Signed)
   CLINICAL SOCIAL WORK PLACEMENT  NOTE  Date:  09/22/2016  Patient Details  Name: Wayne Fuller MRN: [id number] Date of Birth: 1924/02/22  Clinical Social Work is seeking post-discharge placement for this patient at the [place] level of care (*CSW will initial, date and re-position this form in  chart as items are completed):  Yes   Patient/family provided with [place] Work Department's list of facilities offering this level of care within the geographic area requested by the patient (or if unable, by the patient's family).  Yes   Patient/family informed of their freedom to choose among providers that offer the needed level of care, that participate in Medicare, Medicaid or managed care program needed by the patient, have an available bed and are willing to accept the patient.  Yes   Patient/family informed of [facility]'s ownership interest in [facility] and [facility], as well as of the fact that they are under no obligation to receive care at these facilities.  PASRR submitted to EDS on 09/22/16     PASRR number received on 09/22/16     Existing PASRR number confirmed on       FL2 transmitted to all facilities in geographic area requested by pt/family on 09/22/16     FL2 transmitted to all facilities within larger geographic area on       Patient informed that his/her managed care company has contracts with or will negotiate with certain facilities, including the following:        Yes   Patient/family informed of bed offers received.  Patient chooses bed at [facility] and [place] recommends and patient chooses bed at [facility]    Patient to be transferred to [facility] on 09/22/16.  Patient to be transferred to facility by ems: [facility]     Patient family notified on 09/22/16 of transfer.  Name of family member notified:  Glendell Docker (son)     PHYSICIAN Please sign DNR, Please  sign FL2     Additional Comment:    _______________________________________________ Lilly C[place], LCSW 09/22/2016, 3:49 PM

## 2016-09-22 NOTE — Progress Notes (Addendum)
LCSW following for disposition:  Admitted from  facility, family requesting new facility due to frequent falls.  [facility] is requesting [facility] for placement, however son notified that they have declined him for unknown reasons. Call placed to [facility] in effort to learn reason for denial, message left.  [facility] was also called first thing this morning regarding referral and message left in HUB regarding denial, still no contact with admissions.  Call placed to [facility] for possible admission with son's agreement as they too are located in [place] and this would be convenient to family to see patient. Nikki with admissions is actively working to [employer] and speak with [facility] regarding billing and needs.  Once [facility] has reviewed referral (pending status currently), LCSW will call son back and follow up with plans and bed situation.  Lexine Baton reports patient has used 10 days of his [facility] Medicare thus has 10 left under the [facility].  On day 21 patient/family responsible for 160/per day 21-48 days or until deductibile has been met.  After 48 days the days are at cost as patient has met co-pays and deductible.  All information was communicated to son who is calling his brother in reference to plan.  [facility] is working with administration in effort to see if they can accept patient. Son aware and will be updated.  He is also aware of potential discharge.   Per MD, patient may be ready today and LCSW is actively working on discharge plans. 3:49 PM Patient medically stable for discharge. Transfer to [facility] Patient will transfer by EMS. Son Glendell Docker is aware of plan and agreeable along with his other siblings. Facility has been sent all paperwork.  DC today.  Lane Hacker, MSW Clinical Social Work: [employer] Coverage for :  [phone]

## 2016-09-22 NOTE — Discharge Summary (Signed)
Physician Discharge Summary  Wayne Fuller DJM:[id number] DOB: [birth year] DOA: 09/20/2016  PCP: [name], NP  Admit date: 09/20/2016 Discharge date: 09/22/2016  Time spent: 35 minutes  Recommendations for Outpatient Follow-up:  Repeat CBC in 5 days to follow hemoglobin trend Repeat be made in 5 days to follow electrolytes and renal function   Discharge Diagnoses:  Principal Problem:   Acute on chronic renal failure (HCC) Active Problems:   Essential hypertension   CKD (chronic kidney disease) stage 4, GFR 15-29 ml/min (HCC)   Dementia with behavioral disturbance   Metabolic acidosis, NAG, failure of bicarbonate regeneration   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 Patient has been discharged to skilled nursing facility for further care and rehabilitation.  Diet recommendation: Watch sodium intake and continue feeding supplements. Maintain good hydration. Patient Chronic following soft diet.  Filed Weights   09/20/16 2200 09/21/16 0256  Weight: 64 kg (141 lb 1.5 oz) 64.4 kg (141 lb 15.6 oz)    History of present illness:  As per H&P written by Dr. Alcario Drought on 09/21/2016 83 y.o. male with medical history significant of CKD stage 4 with baseline creatinine in the mid to upper 2s for the past year, advanced dementia, frequent falls at the SNF which ultimately caused L hip fx last month.  Repair of L hip Fx.  Patient presents to the ED after being found on the floor at SNF.  He is unable to contribute to history due to dementia.  Patients LLE was shortened and internally rotated. Found to have dislocation of LLE hip prosthesis, this was successfully reduced by the EDP. Patients other labs were significant for K of 5.8, creat of 3.1 up from 2.3 a week ago, BUN 61 up from his baseline 30-40.  Bicarb of 17.  Hospital Course:  1-hip prosthesis  dislocation/Recent left hip fracture -This was resolve in the emergency department -Will continue as needed tramadol and continue physical therapy as per skilled nursing facility protocol. -Continue heparin for DVT prophylaxis -Weight bearing as tolerated with left posterior hip precautions. Outpatient follow-up with orthopedic service.  2-acute on chronic renal failure: Stage IV at baseline -Patient's creatinine usually in the 2-2.5 range -Attributed to be due to dehydration/prerenal azotemia -Please avoid/minimize nephrotoxic agents -After IV fluids given patient renal function back to baseline  3-chronic metabolic acidosis -Due to his chronic renal failure -Will discharge him sodium bicarbonate 650 mg twice a day -Will recommend repeat basic metabolic panel to follow electrolytes in the next 5 days.  4-Hyperkalemia -Due to worsening renal function and metabolic acidosis -Patient initiated on bicarbonate, provide fluid resuscitation and also started on bowel regimen. -Potassium within normal limits at discharge -Patient also with normal magnesium and no abnormalities on telemetry -Will recommend basic metabolic panel in 5 days follow patient electrolytes level  5-anemia of chronic disease -With most likely some contribution of recent blood loss bloating hip surgery -Hemoglobin dropped to less than 7 and the patient was transfused 1 unit of PRBC's -At discharge hemoglobin 7.1 and no signs of acute bleeding -Recommend repeat CBC in 5 days to follow hemoglobin treatment -Patient has been started on Niferex  6-Essential hypertension: -Stable and well controlled -Will continue current antihypertensive regimen  7-severe protein calorie malnutrition -Patient seen and evaluated by the nutritional service -Will follow recommendations for feeding supplements at discharge.  8-Dementia with behavioral abnormalities -We'll continue supportive care and  as needed  Ativan.   Procedures:  See below for x-ray reports  Consultations:  None  Discharge Exam: Vitals:   09/21/16 2035 09/22/16 0406  BP: (!) 132/59 (!) 126/56  Pulse: 70 81  Resp: 18 18  Temp: 98.4 F (36.9 C) 98.1 F (36.7 C)    General: Chronically ill in appearance, underweight; Afebrile And currently denying chest pain, and shortness of breath. Patient is alert and oriented to person only. Able to follow commands but confused and with poor insight. Cardiovascular: S1 and S2, no rubs, no gallops Respiratory: Good air movement bilaterally, no wheezing or crackles Abdomen, soft, nontender, nondistended, positive bowel sounds. Extremities: Left knee stabilizer in place, Decreased range of motion secondary to pain. No swelling, no erythema. Right lower extremity without any acute abnormalities.  Discharge Instructions   Discharge Instructions    Diet - low sodium heart healthy    Complete by:  As directed    Discharge instructions    Complete by:  As directed    Take medications as prescribed Maintain adequate hydration and nutrition Repeat CBC in 5 days follow-up hemoglobin transient Repeat basic metabolic panel in 5 days to follow up electrolytes and renal function Will strongly recommend palliative care consult to follow him up at skilled nursing facility; agent with advanced dementia further declined in his quality of life and overall poor prognosis. Weight bearing as tolerated with posterior hip precautions on his left leg. Please maintain knee stabilizer in place.   Increase activity slowly    Complete by:  As directed      Current Discharge Medication List    START taking these medications   Details  feeding supplement, ENSURE ENLIVE, (ENSURE ENLIVE) LIQD Take 237 mLs by mouth 2 (two) times daily between meals.    iron polysaccharides (NIFEREX) 150 MG capsule Take 1 capsule (150 mg total) by mouth daily.    sodium bicarbonate 650 MG tablet Take 1 tablet (650 mg  total) by mouth 2 (two) times daily.      CONTINUE these medications which have CHANGED   Details  LORazepam (ATIVAN) 0.5 MG tablet Take 1 tablet (0.5 mg total) by mouth every 8 (eight) hours as needed (agitation). Qty: 20 tablet, Refills: 0    traMADol (ULTRAM) 50 MG tablet Take 1 tablet (50 mg total) by mouth every 6 (six) hours as needed for moderate pain. Qty: 20 tablet, Refills: 0      CONTINUE these medications which have NOT CHANGED   Details  amiodarone (PACERONE) 200 MG tablet Take 1 tablet (200 mg total) by mouth daily. Qty: 90 tablet, Refills: 1    amLODipine (NORVASC) 10 MG tablet Take 1 tablet (10 mg total) by mouth daily. Qty: 90 tablet, Refills: 1    aspirin EC 81 MG tablet Take 81 mg by mouth daily.    heparin 5000 UNIT/ML injection Inject 5,000 Units into the skin daily.    hydrALAZINE (APRESOLINE) 100 MG tablet Take 1 tablet (100 mg total) by mouth 3 (three) times daily. Qty: 270 tablet, Refills: 1    metoprolol succinate (TOPROL-XL) 25 MG 24 hr tablet Take 1 tablet (25 mg total) by mouth daily. Qty: 90 tablet, Refills: 1    pantoprazole (PROTONIX) 40 MG tablet Take 40 mg by mouth daily.    tamsulosin (FLOMAX) 0.4 MG CAPS capsule TAKE 1 CAPSULE(0.4 MG) BY MOUTH DAILY AFTER SUPPER Qty: 30 capsule, Refills: 3       No Known Allergies Follow-up Information  EUBANKS, JESSICA K, NP. Schedule an appointment as soon as possible for a visit in 10 day(s).   Specialty:  Geriatric Medicine Contact information: [address]. [place] [address] [phone]           The results of significant diagnostics from this hospitalization (including imaging, microbiology, ancillary and laboratory) are listed below for reference.    Significant Diagnostic Studies: Dg Chest 2 View  Result Date: 09/11/2016 CLINICAL DATA:  Patient altered mental status. Evaluate for pneumonia. EXAM: CHEST  2 VIEW COMPARISON:  Chest radiograph 09/06/2016 FINDINGS: Normal  cardiac and mediastinal contours. No consolidative pulmonary opacities. No pleural effusion for pneumothorax. Nodular densities projecting over the lower lungs bilaterally. Thoracic spine degenerative changes. IMPRESSION: Nodular densities projecting over the lower lungs bilaterally favored to represent nipple shadows. Consider confirmation with repeat chest radiograph and nipple markers. No acute cardiopulmonary process. Electronically Signed   By: Lovey Newcomer M.D.   On: 09/11/2016 09:24   Ct Head Wo Contrast  Result Date: 09/11/2016 CLINICAL DATA:  s/p unwitnessed fall, . Staff reported pt fell out of bed. Bed is very low to the ground. Pt has splint brace on left leg. Pt has dementia and Hx HTN. EXAM: CT HEAD WITHOUT CONTRAST TECHNIQUE: Contiguous axial images were obtained from the base of the skull through the vertex without intravenous contrast. COMPARISON:  09/06/2016 FINDINGS: Brain: No evidence of acute infarction, hemorrhage, hydrocephalus, extra-axial collection or mass lesion/mass effect. There is ventricular and sulcal enlargement reflecting age related volume loss. Patchy white matter hypoattenuation is noted consistent with moderate chronic microvascular ischemic change. This is stable. Vascular: No hyperdense vessel or unexpected calcification. Skull: Normal. Negative for fracture or focal lesion. Sinuses/Orbits: Visualize globes and orbits are unremarkable. Visualized sinuses and mastoid air cells are clear. Other: None. IMPRESSION: 1. No acute intracranial abnormalities. 2. Age related volume loss and moderate chronic microvascular ischemic change. Stable appearance from the prior study. Electronically Signed   By: Lajean Manes M.D.   On: 09/11/2016 08:20   Dg Hip Port Unilat W Or Wo Pelvis 1 View Left  Result Date: 09/20/2016 CLINICAL DATA:  Closed manipulation of left hip dislocation EXAM: DG HIP (WITH OR WITHOUT PELVIS) 1V PORT LEFT COMPARISON:  09/20/2016 CT 2030 hours FINDINGS:  Satisfactory reduction of left bipolar uncemented hip arthroplasty. No acute fracture identified. IMPRESSION: Satisfactory appearing reduction of the left uncemented bipolar hip arthroplasty allowing for limitations of a frontal view only study. Electronically Signed   By: Ashley Royalty M.D.   On: 09/20/2016 23:46   Dg Hip Unilat W Or Wo Pelvis 2-3 Views Left  Result Date: 09/20/2016 CLINICAL DATA:  Fall at nursing home. Left hip replacement 2 weeks ago. EXAM: DG HIP (WITH OR WITHOUT PELVIS) 2-3V LEFT COMPARISON:  09/11/2016 FINDINGS: Examination demonstrates a unipolar left hip arthroplasty with superolateral dislocation of the prosthesis. Degenerative change of the right hip partially visualized hardware intact over the right femur. Degenerate change of the spine. IMPRESSION: Dislocated left hip prosthesis. Electronically Signed   By: Marin Olp M.D.   On: 09/20/2016 20:45   Dg Hip Unilat With Pelvis 2-3 Views Left  Result Date: 09/11/2016 CLINICAL DATA:  Per EMS, pt coming from [facility]. Staff reported pt fell out of bed. Bed is very low to the ground. Pt has splint brace on left leg EXAM: DG HIP (WITH OR WITHOUT PELVIS) 2-3V LEFT COMPARISON:  09/06/2016 FINDINGS: Since prior exam, a left hip arthroplasty has been placed. The prosthetic components appear  well seated and aligned. There is overlying soft tissue swelling presumed postoperative with overlying skin staples. There is no acute fracture. IMPRESSION: 1. No acute fracture. No evidence of disruption of recently placed left hip arthroplasty, which is well aligned. Electronically Signed   By: Lajean Manes M.D.   On: 09/11/2016 08:22   Dg Femur Min 2 Views Left  Result Date: 09/11/2016 CLINICAL DATA:  Per EMS, pt coming from [place]. Staff reported pt fell out of bed. Bed is very low to the ground. Pt has splint brace on left leg EXAM: LEFT FEMUR 2 VIEWS COMPARISON:  None. FINDINGS: No fracture. Left hip prosthesis appears well seated and  well aligned with the acetabulum. No evidence of prosthetic loosening. Knee joint is normally spaced and aligned. There is postoperative soft tissue swelling lateral to the left hip with overlying skin staples. Soft tissues otherwise unremarkable. IMPRESSION: 1. No acute fracture or dislocation. No evidence of disruption of the left hip prosthesis. Electronically Signed   By: Lajean Manes M.D.   On: 09/11/2016 08:24    Microbiology: Recent Results (from the past 240 hour(s))  Urine culture     Status: None   Collection Time: 09/20/16  9:47 PM  Result Value Ref Range Status   Specimen Description URINE, RANDOM  Final   Special Requests NONE  Final   Culture   Final    NO GROWTH Performed at [facility], [address], [place], [place] [address]    Report Status 09/22/2016 FINAL  Final  MRSA PCR Screening     Status: None   Collection Time: 09/21/16  8:21 AM  Result Value Ref Range Status   MRSA by PCR NEGATIVE NEGATIVE Final    Comment:        The GeneXpert MRSA Assay (FDA approved for NASAL specimens only), is one component of a comprehensive MRSA colonization surveillance program. It is not intended to diagnose MRSA infection nor to guide or monitor treatment for MRSA infections.      Labs: Basic Metabolic Panel:  Recent Labs Lab 09/20/16 1958 09/21/16 0500 09/22/16 0527  NA 136 139 141  K 5.8* 5.7* 5.0  CL 107 112* 112*  CO2 17* 20* 23  GLUCOSE 115* 110* 82  BUN 61* 56* 38*  CREATININE 3.14* 2.81* 2.39*  CALCIUM 8.6* 8.2* 7.9*  MG  --   --  1.9   Liver Function Tests:  Recent Labs Lab 09/20/16 1958  AST 29  ALT 18  ALKPHOS 107  BILITOT 1.0  PROT 7.2  ALBUMIN 3.3*   CBC:  Recent Labs Lab 09/20/16 1958 09/21/16 0500 09/22/16 0527  WBC 7.7 6.3 5.3  NEUTROABS 6.6  --   --   HGB 8.3* 6.8* 7.1*  HCT 26.4* 21.3* 21.4*  MCV 97.8 98.2 95.5  PLT 321 317 293    Signed:  Barton Dubois MD.  [facility] 09/22/2016, 3:26 PM

## 2016-09-23 ENCOUNTER — Non-Acute Institutional Stay (SKILLED_NURSING_FACILITY): Payer: Medicare Other | Admitting: Internal Medicine

## 2016-09-23 ENCOUNTER — Encounter: Payer: Self-pay | Admitting: Internal Medicine

## 2016-09-23 ENCOUNTER — Telehealth: Payer: Self-pay

## 2016-09-23 DIAGNOSIS — K219 Gastro-esophageal reflux disease without esophagitis: Secondary | ICD-10-CM | POA: Diagnosis not present

## 2016-09-23 DIAGNOSIS — E875 Hyperkalemia: Secondary | ICD-10-CM | POA: Diagnosis not present

## 2016-09-23 DIAGNOSIS — N179 Acute kidney failure, unspecified: Secondary | ICD-10-CM

## 2016-09-23 DIAGNOSIS — F0391 Unspecified dementia with behavioral disturbance: Secondary | ICD-10-CM

## 2016-09-23 DIAGNOSIS — T84021D Dislocation of internal left hip prosthesis, subsequent encounter: Secondary | ICD-10-CM

## 2016-09-23 DIAGNOSIS — I10 Essential (primary) hypertension: Secondary | ICD-10-CM

## 2016-09-23 DIAGNOSIS — D638 Anemia in other chronic diseases classified elsewhere: Secondary | ICD-10-CM | POA: Diagnosis not present

## 2016-09-23 DIAGNOSIS — Z8781 Personal history of (healed) traumatic fracture: Secondary | ICD-10-CM | POA: Diagnosis not present

## 2016-09-23 DIAGNOSIS — E872 Acidosis, unspecified: Secondary | ICD-10-CM

## 2016-09-23 DIAGNOSIS — N189 Chronic kidney disease, unspecified: Secondary | ICD-10-CM | POA: Diagnosis not present

## 2016-09-23 DIAGNOSIS — E43 Unspecified severe protein-calorie malnutrition: Secondary | ICD-10-CM | POA: Diagnosis not present

## 2016-09-23 DIAGNOSIS — I480 Paroxysmal atrial fibrillation: Secondary | ICD-10-CM | POA: Diagnosis not present

## 2016-09-23 NOTE — Progress Notes (Addendum)
: Provider:  Noah Delaine. Sheppard Coil, MD Location:  [facility] Room Number: 433I Place of Service:  SNF ([phone])  PCP: [name], NP Patient Care Team: [name], NP as PCP - General (Geriatric Medicine) Neldon Labella, NP as Nurse Practitioner (Cardiology) Adrian Prows, MD as Consulting Physician (Cardiology)  Extended Emergency Contact Information Primary Emergency Contact: [facility] Address: [address]          [place], [place] [address] Johnnette Litter of [place] Phone: [phone] Mobile Phone: [phone] Relation: Son Secondary Emergency Contact: Foust,Avis Address: [address]          [place], [place] [address] Johnnette Litter of [place] Phone: [phone] Relation: Daughter     Allergies: Patient has no known allergies.  Chief Complaint  Patient presents with  . New Admit To SNF    Admit to Facility    HPI: Patient is 83 y.o. male with CKD stage 4 with baseline creatinine in the mid to upper 2s for the past year, advanced dementia, frequent falls at the SNF which ultimately caused L hip fx last month. Repair of L hip Fx. Patient presents to the ED after being found on the floor at SNF. He is unable to contribute to history due to dementia. Patients LLE was shortened and internally rotated. Found to have dislocation of LLE hip prosthesis, this was successfully reduced by the EDP. Patients other labs were significant for K of 5.8, creat of 3.1 up from 2.3 a week ago, BUN 61 up from his baseline 30-40. Bicarb of 17.Pt was admitted to [facility] from [facility]3 to manage these problems. Acute on CKD4, metabolic acidosis and hyperkalemia were improved by IVF. Hospital course was further complicated by anemia of chronic dx requiring transfusion 1 u PRBC. Pt is admitted to SNF with generalized weakness for OT/PT.Marland Kitchen While at SNF pt will be followed for HTN, tx with norvasc, hydralazine and toprol, AF, tx with toprol and  amiodarone and ASA and GERD tx with protonix.  Past Medical History:  Diagnosis Date  . BPH (benign prostatic hyperplasia)   . Chronic kidney disease    ckd stage 3  . Dementia   . GERD (gastroesophageal reflux disease)   . Hemorrhoids   . History of angina   . Hyperlipemia   . Hypertension   . Irregular heart rhythm   . Paroxysmal A-fib ([place]) 04/02/2015    Past Surgical History:  Procedure Laterality Date  . BACK SURGERY    . CERVICAL SPINE SURGERY    . SPINE SURGERY      Allergies as of 09/23/2016   No Known Allergies     Medication List       Accurate as of 09/23/16 11:59 PM. Always use your most recent med list.          amiodarone 200 MG tablet Commonly known as:  PACERONE Take 200 mg by mouth daily.   amLODipine 10 MG tablet Commonly known as:  NORVASC Take 1 tablet (10 mg total) by mouth daily.   aspirin EC 81 MG tablet Take 81 mg by mouth daily.   feeding supplement (ENSURE ENLIVE) Liqd Take 237 mLs by mouth 2 (two) times daily between meals.   heparin 5000 UNIT/ML injection Inject 5,000 Units into the skin daily.   hydrALAZINE 100 MG tablet Commonly known as:  APRESOLINE Take 1 tablet (100 mg total) by mouth 3 (three) times daily.   iron polysaccharides 150 MG capsule Commonly  known as:  NIFEREX Take 1 capsule (150 mg total) by mouth daily.   LORazepam 0.5 MG tablet Commonly known as:  ATIVAN Take 1 tablet (0.5 mg total) by mouth every 8 (eight) hours as needed (agitation).   metoprolol succinate 25 MG 24 hr tablet Commonly known as:  TOPROL-XL Take 1 tablet (25 mg total) by mouth daily.   pantoprazole 40 MG tablet Commonly known as:  PROTONIX Take 40 mg by mouth daily.   sodium bicarbonate 650 MG tablet Take 1 tablet (650 mg total) by mouth 2 (two) times daily.   tamsulosin 0.4 MG Caps capsule Commonly known as:  FLOMAX TAKE 1 CAPSULE(0.4 MG) BY MOUTH DAILY AFTER SUPPER   traMADol 50 MG tablet Commonly known as:  ULTRAM Take 1  tablet (50 mg total) by mouth every 6 (six) hours as needed for moderate pain.       Meds ordered this encounter  Medications  . amiodarone (PACERONE) 200 MG tablet    Sig: Take 200 mg by mouth daily.    Immunization History  Administered Date(s) Administered  . Influenza,inj,Quad PF,36+ Mos 04/03/2015, 01/19/2016  . PPD Test 09/22/2016  . Pneumococcal Conjugate-13 06/06/2016    Social History  Substance Use Topics  . Smoking status: Former Smoker    Packs/day: 0.50    Years: 10.00    Types: Cigarettes  . Smokeless tobacco: Never Used     Comment: quit smoking  around 1996  . Alcohol use No     Comment: hx of occasional ETOH use    Family history is   Family History  Problem Relation Age of Onset  . Stroke Mother   . Hypertension Sister   . Other Son        Homicide, stabbed  . Stroke Son   . Hypertension Son   . Diabetes Son   . Hypertension Son       Review of Systems  DATA OBTAINED: from patient, nurse GENERAL:  no fevers, fatigue, appetite changes SKIN: No itching, or rash EYES: No eye pain, redness, discharge EARS: No earache, tinnitus, change in hearing NOSE: No congestion, drainage or bleeding  MOUTH/THROAT: No mouth or tooth pain, No sore throat RESPIRATORY: No cough, wheezing, SOB CARDIAC: No chest pain, palpitations, lower extremity edema  GI: No abdominal pain, No N/V/D or constipation, No heartburn or reflux  GU: No dysuria, frequency or urgency, or incontinence  MUSCULOSKELETAL: No unrelieved bone/joint pain NEUROLOGIC: No headache, dizziness or focal weakness PSYCHIATRIC: No c/o anxiety or sadness   Vitals:   09/23/16 0901  BP: (!) 126/56  Pulse: 81  Resp: 18  Temp: 98 F (36.7 C)    SpO2 Readings from Last 1 Encounters:  09/29/16 93%   Body mass index is 18.6 kg/m.     Physical Exam  GENERAL APPEARANCE: Alert, conversant,  No acute distress.  SKIN: No diaphoresis rash HEAD: Normocephalic, atraumatic  EYES:  Conjunctiva/lids clear. Pupils round, reactive. EOMs intact.  EARS: External exam WNL, canals clear. Hearing grossly normal.  NOSE: No deformity or discharge.  MOUTH/THROAT: Lips w/o lesions  RESPIRATORY: Breathing is even, unlabored. Lung sounds are clear   CARDIOVASCULAR: Heart RRR 2/6 murmur LLSB, no rubs or gallops. No peripheral edema.   GASTROINTESTINAL: Abdomen is soft, non-tender, not distended w/ normal bowel sounds. GENITOURINARY: Bladder non tender, not distended  MUSCULOSKELETAL: No abnormal joints or musculature NEUROLOGIC:  Cranial nerves 2-12 grossly intact. Moves all extremities; oriented to self.  PSYCHIATRIC: Mood and affect with dementia,  no behavioral issues  Patient Active Problem List   Diagnosis Date Noted  . S/p left hip fracture 09/29/2016  . Dehydration   . 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 Dislocation of internal left hip prosthesis ([place]) 09/21/2016  . Hyperkalemia 09/21/2016  . Protein-calorie malnutrition, severe 09/21/2016  . Acute encephalopathy 01/30/2016  . Conjunctivitis of left eye 01/30/2016  . Anemia, chronic disease 01/30/2016  . Acute kidney injury superimposed on CKD ([place]) 01/30/2016  . Confusion 01/29/2016  . Dementia with behavioral disturbance 01/19/2016  . Back pain 01/19/2016  . Elevated troponin 04/02/2015  . Paroxysmal A-fib ([place]) 04/02/2015  . CKD (chronic kidney disease) stage 4, GFR 15-29 ml/min (H[facility]) 08/11/2014  . GERD (gastroesophageal reflux disease) 08/11/2014  . Chest pain at rest 08/11/2014  . Chest pain 04/03/2014  . Leukocytopenia 04/29/2009  . Essential hypertension 04/29/2009  . CAD (coronary artery disease) 04/29/2009  . CHEST PAIN, PLEURITIC 04/29/2009  . TOBACCO ABUSE, HX OF 04/29/2009      Labs reviewed: Basic Metabolic Panel:    Component Value Date/Time   NA 141 09/22/2016 0527   NA 141 09/22/2016   K 5.0 09/22/2016 0527   CL 112 (H) 09/22/2016 0527   CO2 23  09/22/2016 0527   GLUCOSE 82 09/22/2016 0527   BUN 38 (H) 09/22/2016 0527   BUN 38 (A) 09/22/2016   CREATININE 2.39 (H) 09/22/2016 0527   CREATININE 3.12 (H) 06/06/2016 1108   CALCIUM 7.9 (L) 09/22/2016 0527   PROT 7.2 09/20/2016 1958   ALBUMIN 3.3 (L) 09/20/2016 1958   AST 29 09/20/2016 1958   ALT 18 09/20/2016 1958   ALKPHOS 107 09/20/2016 1958   BILITOT 1.0 09/20/2016 1958   GFRNONAA 22 (L) 09/22/2016 0527   GFRNONAA 17 (L) 06/06/2016 1108   GFRAA 26 (L) 09/22/2016 0527   GFRAA 19 (L) 06/06/2016 1108     Recent Labs  01/30/16 0540  09/20/16 1958 09/21/16 09/21/16 0500 09/22/16 09/22/16 0527  NA 142  < > 136 139 139 141 141  K 4.4  < > 5.8* 5.7* 5.7*  --  5.0  CL 113*  < > 107  --  112*  --  112*  CO2 21*  < > 17*  --  20*  --  23  GLUCOSE 88  < > 115*  --  110*  --  82  BUN 24*  < > 61* 56* 56* 38* 38*  CREATININE 2.97*  < > 3.14* 2.8* 2.81* 2.4* 2.39*  CALCIUM 8.3*  < > 8.6*  --  8.2*  --  7.9*  MG 2.0  --   --   --   --   --  1.9  PHOS 3.9  --   --   --   --   --   --   < > = values in this interval not displayed. Liver Function Tests:  Recent Labs  03/03/16 1604 06/06/16 1108 09/20/16 1958  AST 15 17 29   ALT 7* 8* 18  ALKPHOS 45 52 107  BILITOT 0.4 0.4 1.0  PROT 6.4 6.3 7.2  ALBUMIN 3.9 3.8 3.3*    Recent Labs  01/11/16 1600  LIPASE 36    Recent Labs  01/11/16 1600  AMMONIA 13   CBC:  Recent Labs  06/06/16 1108 09/11/16 1252  09/20/16 1958 09/21/16 09/21/16 0500 09/22/16 09/22/16 0527  WBC 2.6* 6.2  < > 7.7 6.3 6.3 5.3 5.3  NEUTROABS 1,508 5.2  --  6.6  --   --   --   --   HGB 9.8* 10.0*  < > 8.3* 6.8* 6.8*  --  7.1*  HCT 30.5* 30.8*  < > 26.4* 21* 21.3*  --  21.4*  MCV 96.8 92.5  --  97.8  --  98.2  --  95.5  PLT 196 161  < > 321 317 317  --  293  < > = values in this interval not displayed. Lipid No results for input(s): CHOL, HDL, LDLCALC, TRIG in the last 8760 hours.  Cardiac Enzymes: No results for input(s): CKTOTAL, CKMB,  CKMBINDEX, TROPONINI in the last 8760 hours. BNP: No results for input(s): BNP in the last 8760 hours. No results found for: MICROALBUR No results found for: HGBA1C Lab Results  Component Value Date   TSH 2.280 01/30/2016   Lab Results  Component Value Date   VITAMINB12 208 01/30/2016   Lab Results  Component Value Date   FOLATE 6.5 01/30/2016   Lab Results  Component Value Date   IRON 55 01/30/2016   TIBC 246 (L) 01/30/2016   FERRITIN 65 01/30/2016    Imaging and Procedures obtained prior to SNF admission: Dg Hip Port Unilat W Or Wo Pelvis 1 View Left  Result Date: 09/20/2016 CLINICAL DATA:  Closed manipulation of left hip dislocation EXAM: DG HIP (WITH OR WITHOUT PELVIS) 1V PORT LEFT COMPARISON:  09/20/2016 CT 2030 hours FINDINGS: Satisfactory reduction of left bipolar uncemented hip arthroplasty. No acute fracture identified. IMPRESSION: Satisfactory appearing reduction of the left uncemented bipolar hip arthroplasty allowing for limitations of a frontal view only study. Electronically Signed   By: Ashley Royalty M.D.   On: 09/20/2016 23:46   Dg Hip Unilat W Or Wo Pelvis 2-3 Views Left  Result Date: 09/20/2016 CLINICAL DATA:  Fall at nursing home. Left hip replacement 2 weeks ago. EXAM: DG HIP (WITH OR WITHOUT PELVIS) 2-3V LEFT COMPARISON:  09/11/2016 FINDINGS: Examination demonstrates a unipolar left hip arthroplasty with superolateral dislocation of the prosthesis. Degenerative change of the right hip partially visualized hardware intact over the right femur. Degenerate change of the spine. IMPRESSION: Dislocated left hip prosthesis. Electronically Signed   By: Marin Olp M.D.   On: 09/20/2016 20:45     Not all labs, radiology exams or other studies done during hospitalization come through on my EPIC note; however they are reviewed by me.    Assessment and Plan  DISLOCATION OF HIP PROSTHESIS/RECENT L HIP FRACTURE- reduced in ED; Continue heparin for DVT prophylaxis; Weight  bearing as tolerated with left posterior hip precautions. Outpatient follow-up with orthopedic service. SNF - admitted for OT/PT  ACUTE ON CKD 4/ CHRONIC METABOLIC ACIDOSIS/ HYPERKALEMIA - improved with IVF back to baseline 2.0-2.5, started on bicarb  650 mg BID ; hyperkalemia resolved SNF - f/u BMP  ANEMIA OF CHRONIC DX- some componenet of ABLA 2/2 recent surgery; Hemoglobin dropped to less than 7 and the patient was transfused 1 unit of PRBC's -At discharge hemoglobin 7.1 and no signs of acute bleeding; pt started on niferex SNF - plan cont niferex 150 mg daily; f/u CBC  SEVERE PROTEIN MALNUTRITION SNF - cont protein supple,ments  HTN SNF - controlled; cont norvasc 10 mg daily, hydralazine 100 mg TID and Toprol XL 25 mg daily  AF SNF - cont amiodarone 200 mg daily and toprolXL 50 mg daily with ASA 81 mg as prophylaxis  GERD SNF - not stated as uncontrolled; cont protonix 40 mg daily  DEMENTIA  SNF - on no dementia specific meds; cont ativan o.5 mg prn    Time spent . 45 min;> 50% of time with patient was spent reviewing records, labs, tests and studies, counseling and developing plan of care  Webb Silversmith D. Sheppard Coil, MD

## 2016-09-23 NOTE — Telephone Encounter (Signed)
This is a patient of Clawson, who was admitted to [facility] after hospitalization. [facility] F/U is needed. Hospital discharge from [facility] on 09/22/2016

## 2016-09-25 LAB — TYPE AND SCREEN
ABO/RH(D): O POS
Antibody Screen: NEGATIVE
Unit division: 0
Unit division: 0

## 2016-09-25 LAB — BPAM RBC
Blood Product Expiration Date: 201805242359
Blood Product Expiration Date: 201805242359
ISSUE DATE / TIME: 201805020837
Unit Type and Rh: 5100
Unit Type and Rh: 5100

## 2016-09-29 ENCOUNTER — Non-Acute Institutional Stay (SKILLED_NURSING_FACILITY): Payer: Medicare Other | Admitting: Internal Medicine

## 2016-09-29 ENCOUNTER — Encounter: Payer: Self-pay | Admitting: Internal Medicine

## 2016-09-29 DIAGNOSIS — I10 Essential (primary) hypertension: Secondary | ICD-10-CM

## 2016-09-29 DIAGNOSIS — D638 Anemia in other chronic diseases classified elsewhere: Secondary | ICD-10-CM | POA: Diagnosis not present

## 2016-09-29 DIAGNOSIS — G301 Alzheimer's disease with late onset: Secondary | ICD-10-CM

## 2016-09-29 DIAGNOSIS — N189 Chronic kidney disease, unspecified: Secondary | ICD-10-CM

## 2016-09-29 DIAGNOSIS — T84021D Dislocation of internal left hip prosthesis, subsequent encounter: Secondary | ICD-10-CM

## 2016-09-29 DIAGNOSIS — E875 Hyperkalemia: Secondary | ICD-10-CM | POA: Diagnosis not present

## 2016-09-29 DIAGNOSIS — Z8781 Personal history of (healed) traumatic fracture: Secondary | ICD-10-CM

## 2016-09-29 DIAGNOSIS — I480 Paroxysmal atrial fibrillation: Secondary | ICD-10-CM | POA: Diagnosis not present

## 2016-09-29 DIAGNOSIS — F02818 Dementia in other diseases classified elsewhere, unspecified severity, with other behavioral disturbance: Secondary | ICD-10-CM

## 2016-09-29 DIAGNOSIS — F0281 Dementia in other diseases classified elsewhere with behavioral disturbance: Secondary | ICD-10-CM

## 2016-09-29 DIAGNOSIS — E872 Acidosis, unspecified: Secondary | ICD-10-CM

## 2016-09-29 DIAGNOSIS — K219 Gastro-esophageal reflux disease without esophagitis: Secondary | ICD-10-CM

## 2016-09-29 DIAGNOSIS — N179 Acute kidney failure, unspecified: Secondary | ICD-10-CM

## 2016-09-29 DIAGNOSIS — E43 Unspecified severe protein-calorie malnutrition: Secondary | ICD-10-CM | POA: Diagnosis not present

## 2016-09-29 NOTE — Progress Notes (Signed)
Location:  [place] Room Number: 106P Place of Service:  SNF (31) Wayne Fuller. Sheppard Coil, MD  PCP: [name], NP Patient Care Team: [name], NP as PCP - General (Geriatric Medicine) Neldon Labella, NP as Nurse Practitioner (Cardiology) Adrian Prows, MD as Consulting Physician (Cardiology)  Extended Emergency Contact Information Primary Emergency Contact: [facility] Address: [address]          [place], [place] [address] Johnnette Litter of [place] Phone: [phone] Mobile Phone: [phone] Relation: Son Secondary Emergency Contact: Foust,Avis Address: [address]          [place],  [address] Johnnette Litter of [place] Phone: [phone] Relation: Daughter  No Known Allergies  Chief Complaint  Patient presents with  . Discharge Note    Discharged from SNF    HPI:  83 y.o. male with CKD stage 4 with baseline creatinine in the mid to upper 2s for the past year, advanced dementia, frequent falls at the SNF which ultimately caused L hip fx last month. Repair of L hip Fx. Patient presents to the ED after being found on the floor at SNF. He is unable to contribute to history due to dementia. Patients LLE was shortened and internally rotated. Found to have dislocation of LLE hip prosthesis, this was successfully reduced by the EDP. Patients other labs were significant for K of 5.8, creat of 3.1 up from 2.3 a week ago, BUN 61 up from his baseline 30-40. Bicarb of 17.Pt was admitted to [facility] from 5/1-3 to manage these problems. Acute on CKD4, metabolic acidosis and hyperkalemia were improved by IVF. Hospital course was further complicated by anemia of chronic dx requiring transfusion 1 u PRBC. Pt is admitted to SNF with generalized weakness for OT/PT and is now ready to be d/c to home.     Past Medical History:  Diagnosis Date  . BPH (benign prostatic hyperplasia)   . Chronic kidney disease    ckd stage 3    . Dementia   . GERD (gastroesophageal reflux disease)   . Hemorrhoids   . History of angina   . Hyperlipemia   . Hypertension   . Irregular heart rhythm   . Paroxysmal A-fib ([place]) 04/02/2015    Past Surgical History:  Procedure Laterality Date  . BACK SURGERY    . CERVICAL SPINE SURGERY    . SPINE SURGERY       reports that he has quit smoking. His smoking use included Cigarettes. He has a 5.00 pack-year smoking history. He has never used smokeless tobacco. He reports that he does not drink alcohol or use drugs. Social History   Social History  . Marital status: Married    Spouse name: N/A  . Number of children: N/A  . Years of education: N/A   Occupational History  . Not on file.   Social History Main Topics  . Smoking status: Former Smoker    Packs/day: 0.50    Years: 10.00    Types: Cigarettes  . Smokeless tobacco: Never Used     Comment: quit smoking  around 1996  . Alcohol use No     Comment: hx of occasional ETOH use  . Drug use: No  . Sexual activity: Not Currently   Other Topics Concern  . Not on file   Social History Narrative   Diet? Regular      Do you drink/eat things with caffeine? Soda, sometimes  Marital status?      widow                              What year were you married? 37 years      Do you live in a house, apartment, assisted living, condo, trailer, etc.? house      Is it one or more stories? n/a      How many persons live in your home? 1      Do you have any pets in your home? (please list) no      Current or past profession: work in Associate Professor      Do you exercise?          no                            Type & how often?      Do you have a living will?  yes      Do you have a DNR form?     no                             If not, do you want to discuss one?      Do you have signed POA/HPOA for forms? yes             Pertinent  Health Maintenance Due  Topic Date Due  . INFLUENZA VACCINE  12/21/2016  . PNA  vac Low Risk Adult (2 of 2 - PPSV23) 06/06/2017    Medications: Allergies as of 09/29/2016   No Known Allergies     Medication List       Accurate as of 09/29/16  2:50 PM. Always use your most recent med list.          amiodarone 200 MG tablet Commonly known as:  PACERONE Take 200 mg by mouth daily.   amLODipine 10 MG tablet Commonly known as:  NORVASC Take 1 tablet (10 mg total) by mouth daily.   aspirin EC 81 MG tablet Take 81 mg by mouth daily.   feeding supplement (ENSURE ENLIVE) Liqd Take 237 mLs by mouth 2 (two) times daily between meals.   heparin 5000 UNIT/ML injection Inject 5,000 Units into the skin daily.   hydrALAZINE 100 MG tablet Commonly known as:  APRESOLINE Take 1 tablet (100 mg total) by mouth 3 (three) times daily.   iron polysaccharides 150 MG capsule Commonly known as:  NIFEREX Take 1 capsule (150 mg total) by mouth daily.   LORazepam 0.5 MG tablet Commonly known as:  ATIVAN Take 1 tablet (0.5 mg total) by mouth every 8 (eight) hours as needed (agitation).   metoprolol succinate 25 MG 24 hr tablet Commonly known as:  TOPROL-XL Take 1 tablet (25 mg total) by mouth daily.   pantoprazole 40 MG tablet Commonly known as:  PROTONIX Take 40 mg by mouth daily.   sodium bicarbonate 650 MG tablet Take 1 tablet (650 mg total) by mouth 2 (two) times daily.   tamsulosin 0.4 MG Caps capsule Commonly known as:  FLOMAX TAKE 1 CAPSULE(0.4 MG) BY MOUTH DAILY AFTER SUPPER   traMADol 50 MG tablet Commonly known as:  ULTRAM Take 1 tablet (50 mg total) by mouth every 6 (six) hours as needed for moderate pain.        Vitals:   09/29/16  0848  BP: (!) 127/55  Pulse: 67  Resp: 18  Temp: 98.5 F (36.9 C)  SpO2: 93%  Weight: 141 lb (64 kg)  Height: 6\' 1"  (1.854 m)   Body mass index is 18.6 kg/m.  Physical Exam  GENERAL APPEARANCE: Alert,  No acute distress.  HEENT: Unremarkable. RESPIRATORY: Breathing is even, unlabored. Lung sounds are  clear   CARDIOVASCULAR: Heart RRR 2/6 murmur LLSB, no rubs or gallops. No peripheral edema.  GASTROINTESTINAL: Abdomen is soft, non-tender, not distended w/ normal bowel sounds.  NEUROLOGIC: Cranial nerves 2-12 grossly intact. Moves all extremities   Labs reviewed: Basic Metabolic Panel:  Recent Labs  01/30/16 0540  09/20/16 1958 09/21/16 09/21/16 0500 09/22/16 09/22/16 0527  NA 142  < > 136 139 139 141 141  K 4.4  < > 5.8* 5.7* 5.7*  --  5.0  CL 113*  < > 107  --  112*  --  112*  CO2 21*  < > 17*  --  20*  --  23  GLUCOSE 88  < > 115*  --  110*  --  82  BUN 24*  < > 61* 56* 56* 38* 38*  CREATININE 2.97*  < > 3.14* 2.8* 2.81* 2.4* 2.39*  CALCIUM 8.3*  < > 8.6*  --  8.2*  --  7.9*  MG 2.0  --   --   --   --   --  1.9  PHOS 3.9  --   --   --   --   --   --   < > = values in this interval not displayed. No results found for: [facility] Liver Function Tests:  Recent Labs  03/03/16 1604 06/06/16 1108 09/20/16 1958  AST 15 17 29   ALT 7* 8* 18  ALKPHOS 45 52 107  BILITOT 0.4 0.4 1.0  PROT 6.4 6.3 7.2  ALBUMIN 3.9 3.8 3.3*    Recent Labs  01/11/16 1600  LIPASE 36    Recent Labs  01/11/16 1600  AMMONIA 13   CBC:  Recent Labs  06/06/16 1108 09/11/16 1252  09/20/16 1958 09/21/16 09/21/16 0500 09/22/16 09/22/16 0527  WBC 2.6* 6.2  < > 7.7 6.3 6.3 5.3 5.3  NEUTROABS 1,508 5.2  --  6.6  --   --   --   --   HGB 9.8* 10.0*  < > 8.3* 6.8* 6.8*  --  7.1*  HCT 30.5* 30.8*  < > 26.4* 21* 21.3*  --  21.4*  MCV 96.8 92.5  --  97.8  --  98.2  --  95.5  PLT 196 161  < > 321 317 317  --  293  < > = values in this interval not displayed. Lipid No results for input(s): CHOL, HDL, LDLCALC, TRIG in the last 8760 hours. Cardiac Enzymes: No results for input(s): CKTOTAL, CKMB, CKMBINDEX, TROPONINI in the last 8760 hours. BNP: No results for input(s): BNP in the last 8760 hours. CBG: No results for input(s): GLUCAP in the last 8760 hours.  Procedures and Imaging Studies  During Stay: Dg Chest 2 View  Result Date: 09/11/2016 CLINICAL DATA:  Patient altered mental status. Evaluate for pneumonia. EXAM: CHEST  2 VIEW COMPARISON:  Chest radiograph 09/06/2016 FINDINGS: Normal cardiac and mediastinal contours. No consolidative pulmonary opacities. No pleural effusion for pneumothorax. Nodular densities projecting over the lower lungs bilaterally. Thoracic spine degenerative changes. IMPRESSION: Nodular densities projecting over the lower lungs bilaterally favored to represent nipple shadows. Consider confirmation with  repeat chest radiograph and nipple markers. No acute cardiopulmonary process. Electronically Signed   By: Lovey Newcomer M.D.   On: 09/11/2016 09:24   Ct Head Wo Contrast  Result Date: 09/11/2016 CLINICAL DATA:  s/p unwitnessed fall, . Staff reported pt fell out of bed. Bed is very low to the ground. Pt has splint brace on left leg. Pt has dementia and Hx HTN. EXAM: CT HEAD WITHOUT CONTRAST TECHNIQUE: Contiguous axial images were obtained from the base of the skull through the vertex without intravenous contrast. COMPARISON:  09/06/2016 FINDINGS: Brain: No evidence of acute infarction, hemorrhage, hydrocephalus, extra-axial collection or mass lesion/mass effect. There is ventricular and sulcal enlargement reflecting age related volume loss. Patchy white matter hypoattenuation is noted consistent with moderate chronic microvascular ischemic change. This is stable. Vascular: No hyperdense vessel or unexpected calcification. Skull: Normal. Negative for fracture or focal lesion. Sinuses/Orbits: Visualize globes and orbits are unremarkable. Visualized sinuses and mastoid air cells are clear. Other: None. IMPRESSION: 1. No acute intracranial abnormalities. 2. Age related volume loss and moderate chronic microvascular ischemic change. Stable appearance from the prior study. Electronically Signed   By: Lajean Manes M.D.   On: 09/11/2016 08:20   Dg Hip Port Unilat W Or Wo  Pelvis 1 View Left  Result Date: 09/20/2016 CLINICAL DATA:  Closed manipulation of left hip dislocation EXAM: DG HIP (WITH OR WITHOUT PELVIS) 1V PORT LEFT COMPARISON:  09/20/2016 CT 2030 hours FINDINGS: Satisfactory reduction of left bipolar uncemented hip arthroplasty. No acute fracture identified. IMPRESSION: Satisfactory appearing reduction of the left uncemented bipolar hip arthroplasty allowing for limitations of a frontal view only study. Electronically Signed   By: Ashley Royalty M.D.   On: 09/20/2016 23:46   Dg Hip Unilat W Or Wo Pelvis 2-3 Views Left  Result Date: 09/20/2016 CLINICAL DATA:  Fall at nursing home. Left hip replacement 2 weeks ago. EXAM: DG HIP (WITH OR WITHOUT PELVIS) 2-3V LEFT COMPARISON:  09/11/2016 FINDINGS: Examination demonstrates a unipolar left hip arthroplasty with superolateral dislocation of the prosthesis. Degenerative change of the right hip partially visualized hardware intact over the right femur. Degenerate change of the spine. IMPRESSION: Dislocated left hip prosthesis. Electronically Signed   By: Marin Olp M.D.   On: 09/20/2016 20:45   Dg Hip Unilat With Pelvis 2-3 Views Left  Result Date: 09/11/2016 CLINICAL DATA:  Per EMS, pt coming from [facility]. Staff reported pt fell out of bed. Bed is very low to the ground. Pt has splint brace on left leg EXAM: DG HIP (WITH OR WITHOUT PELVIS) 2-3V LEFT COMPARISON:  09/06/2016 FINDINGS: Since prior exam, a left hip arthroplasty has been placed. The prosthetic components appear well seated and aligned. There is overlying soft tissue swelling presumed postoperative with overlying skin staples. There is no acute fracture. IMPRESSION: 1. No acute fracture. No evidence of disruption of recently placed left hip arthroplasty, which is well aligned. Electronically Signed   By: Lajean Manes M.D.   On: 09/11/2016 08:22   Dg Femur Min 2 Views Left  Result Date: 09/11/2016 CLINICAL DATA:  Per EMS, pt coming from [facility]. Staff  reported pt fell out of bed. Bed is very low to the ground. Pt has splint brace on left leg EXAM: LEFT FEMUR 2 VIEWS COMPARISON:  None. FINDINGS: No fracture. Left hip prosthesis appears well seated and well aligned with the acetabulum. No evidence of prosthetic loosening. Knee joint is normally spaced and aligned. There is postoperative soft tissue swelling lateral to the  left hip with overlying skin staples. Soft tissues otherwise unremarkable. IMPRESSION: 1. No acute fracture or dislocation. No evidence of disruption of the left hip prosthesis. Electronically Signed   By: Lajean Manes M.D.   On: 09/11/2016 08:24    Assessment/Plan:   Dislocation of internal left hip prosthesis, subsequent encounter  S/p left hip fracture  Acute kidney injury superimposed on CKD (HCC)  Hyperkalemia  Metabolic acidosis, NAG, failure of bicarbonate regeneration  Protein-calorie malnutrition, severe  Anemia, chronic disease  Late onset Alzheimer's disease with behavioral disturbance  Paroxysmal A-fib ([place])  Essential hypertension  Gastroesophageal reflux disease without esophagitis   Patient is being discharged with the following home health services:  Ot/PT/Nursing  Patient is being discharged with the following durable medical equipment:  Standard WC  Patient has been advised to f/u with their PCP in 1-2 weeks to bring them up to date on their rehab stay.  Social services at facility was responsible for arranging this appointment.  Pt was provided with a 30 day supply of prescriptions for medications and refills must be obtained from their PCP.  For controlled substances, a more limited supply may be provided adequate until PCP appointment only.  Medications have been reconciled.   Time spent > 30 min;> 50% of time with patient was spent reviewing records, labs, tests and studies, counseling and developing plan of care  Wayne Fuller. Sheppard Coil, MD

## 2016-10-03 DIAGNOSIS — F0391 Unspecified dementia with behavioral disturbance: Secondary | ICD-10-CM | POA: Diagnosis not present

## 2016-10-03 DIAGNOSIS — T84021D Dislocation of internal left hip prosthesis, subsequent encounter: Secondary | ICD-10-CM | POA: Diagnosis not present

## 2016-10-03 DIAGNOSIS — S72002S Fracture of unspecified part of neck of left femur, sequela: Secondary | ICD-10-CM | POA: Diagnosis not present

## 2016-10-03 DIAGNOSIS — W19XXXS Unspecified fall, sequela: Secondary | ICD-10-CM | POA: Diagnosis not present

## 2016-10-03 DIAGNOSIS — I129 Hypertensive chronic kidney disease with stage 1 through stage 4 chronic kidney disease, or unspecified chronic kidney disease: Secondary | ICD-10-CM | POA: Diagnosis not present

## 2016-10-03 DIAGNOSIS — N184 Chronic kidney disease, stage 4 (severe): Secondary | ICD-10-CM | POA: Diagnosis not present

## 2016-10-03 DIAGNOSIS — E43 Unspecified severe protein-calorie malnutrition: Secondary | ICD-10-CM | POA: Diagnosis not present

## 2016-10-03 DIAGNOSIS — D631 Anemia in chronic kidney disease: Secondary | ICD-10-CM | POA: Diagnosis not present

## 2016-10-13 ENCOUNTER — Ambulatory Visit: Payer: Medicare Other | Admitting: Nurse Practitioner

## 2016-10-13 ENCOUNTER — Encounter: Payer: Self-pay | Admitting: Nurse Practitioner

## 2016-10-20 ENCOUNTER — Ambulatory Visit: Payer: Medicare Other | Admitting: Nurse Practitioner

## 2016-10-20 ENCOUNTER — Encounter: Payer: Self-pay | Admitting: Nurse Practitioner

## 2016-10-20 ENCOUNTER — Ambulatory Visit (INDEPENDENT_AMBULATORY_CARE_PROVIDER_SITE_OTHER): Payer: Medicare Other | Admitting: Nurse Practitioner

## 2016-10-20 VITALS — BP 118/64 | HR 64 | Temp 97.7°F | Resp 17 | Ht 73.0 in | Wt 145.0 lb

## 2016-10-20 DIAGNOSIS — N189 Chronic kidney disease, unspecified: Secondary | ICD-10-CM

## 2016-10-20 DIAGNOSIS — E43 Unspecified severe protein-calorie malnutrition: Secondary | ICD-10-CM

## 2016-10-20 DIAGNOSIS — I480 Paroxysmal atrial fibrillation: Secondary | ICD-10-CM

## 2016-10-20 DIAGNOSIS — D638 Anemia in other chronic diseases classified elsewhere: Secondary | ICD-10-CM

## 2016-10-20 DIAGNOSIS — G301 Alzheimer's disease with late onset: Secondary | ICD-10-CM | POA: Diagnosis not present

## 2016-10-20 DIAGNOSIS — G4701 Insomnia due to medical condition: Secondary | ICD-10-CM | POA: Diagnosis not present

## 2016-10-20 DIAGNOSIS — R600 Localized edema: Secondary | ICD-10-CM | POA: Diagnosis not present

## 2016-10-20 DIAGNOSIS — E875 Hyperkalemia: Secondary | ICD-10-CM

## 2016-10-20 DIAGNOSIS — F0281 Dementia in other diseases classified elsewhere with behavioral disturbance: Secondary | ICD-10-CM | POA: Diagnosis not present

## 2016-10-20 DIAGNOSIS — I10 Essential (primary) hypertension: Secondary | ICD-10-CM | POA: Diagnosis not present

## 2016-10-20 DIAGNOSIS — N179 Acute kidney failure, unspecified: Secondary | ICD-10-CM

## 2016-10-20 DIAGNOSIS — T84021D Dislocation of internal left hip prosthesis, subsequent encounter: Secondary | ICD-10-CM | POA: Diagnosis not present

## 2016-10-20 MED ORDER — AMIODARONE HCL 200 MG PO TABS: 200.0000 mg | ORAL_TABLET | Freq: Every day | ORAL | 6 refills | Status: DC

## 2016-10-20 MED ORDER — MEMANTINE HCL 5 MG PO TABS: ORAL_TABLET | ORAL | 1 refills | Status: DC

## 2016-10-20 MED ORDER — AMLODIPINE BESYLATE 10 MG PO TABS: 10.0000 mg | ORAL_TABLET | Freq: Every day | ORAL | 6 refills | Status: DC

## 2016-10-20 MED ORDER — HYDRALAZINE HCL 100 MG PO TABS: 100.0000 mg | ORAL_TABLET | Freq: Three times a day (TID) | ORAL | 0 refills | Status: DC

## 2016-10-20 NOTE — Patient Instructions (Signed)
To use melatonin 3- 6 mg by mouth at bedtime for sleep To use compression hose to legs to help with swelling. To elevate legs above the level of the heart during the day when sitting Avoid foods high in sodium To start namenda 5 mg daily for 1 week then increase to twice daily for dementia, hopefully this will help with behaviors.

## 2016-10-20 NOTE — Progress Notes (Signed)
Careteam: Patient Care Team: [name], NP as PCP - General (Geriatric Medicine) Neldon Labella, NP as Nurse Practitioner (Cardiology) Adrian Prows, MD as Consulting Physician (Cardiology)  Advanced Directive information Does Patient Have a Medical Advance Directive?: Yes, Type of Advance Directive: Healthcare Power of Attorney  No Known Allergies  Chief Complaint  Patient presents with  . Follow-up    Pt is being seen for a follow up after being discharged from [facility] on 09/29/16. Son would like to discuss FL2, sleep aid, swelling in feet/ankles.     HPI: Patient is a 83 y.o. male seen in the office today to follow up discharge from [facility] after hospitalization. Pt with hx of CKD stage 4 with baseline creatinine in the mid to upper 2s for the past year, advanced dementia, pt had frequent falls. originally at the SNF due to left hip fracture then was having frequent falls which caused  disloaction of LLE hip prosthesis therefore underwent Repair of L hip Fx.  Patients other labs during hospitalization were significant for K of 5.8, creat of 3.1 up from 2.3, BUN 61 up from his baseline 30-40. Bicarb of 17.Pt was admitted to [facility] from 5/1-3 to manage these problems. Acute on CKD4, metabolic acidosis and hyperkalemia were improved by IVF. Hospital course was further complicated by anemia of chronic dx requiring transfusion 1 u PRBC. Pt was admitted to SNF with generalized weakness for OT/PT and on 5/10 he was d/c to home with home health and son is living with him.  Son at appt today. Reports his siblings are helping to find a place for him to go. Looking into a SNF>  PT/OT/Nursing at home.  Using the walker at home. Minimal pain.  No additional falls at home.  Worried about swollen ankles.  Also having a hard time sleeping at night. Will only sleep for a little while when he does sleep.  Has tired melatonin 3 mg which did help but not on this now.    Review of Systems:  Review of Systems  Constitutional: Negative for chills, fever, malaise/fatigue and weight loss.  HENT: Positive for hearing loss. Negative for congestion.   Eyes: Negative for blurred vision.  Respiratory: Negative for cough and shortness of breath.   Cardiovascular: Positive for leg swelling. Negative for chest pain and palpitations.  Gastrointestinal: Negative for abdominal pain, blood in stool, constipation and melena.  Genitourinary: Negative for dysuria.  Musculoskeletal: Positive for falls. Negative for myalgias.  Skin: Negative for itching and rash.  Neurological: Negative for dizziness, loss of consciousness, weakness and headaches.  Endo/Heme/Allergies: Does not bruise/bleed easily.  Psychiatric/Behavioral: Positive for memory loss (with occasional behaviors, worse at night). Negative for depression. The patient has insomnia. The patient is not nervous/anxious.     Past Medical History:  Diagnosis Date  . BPH (benign prostatic hyperplasia)   . Chronic kidney disease    ckd stage 3  . Dementia   . GERD (gastroesophageal reflux disease)   . Hemorrhoids   . History of angina   . Hyperlipemia   . Hypertension   . Irregular heart rhythm   . Paroxysmal A-fib ([place]) 04/02/2015   Past Surgical History:  Procedure Laterality Date  . BACK SURGERY    . CERVICAL SPINE SURGERY    . SPINE SURGERY     Social History:   reports that he has quit smoking. His smoking use included Cigarettes. He has a 5.00 pack-year smoking history. He has never  used smokeless tobacco. He reports that he does not drink alcohol or use drugs.  Family History  Problem Relation Age of Onset  . Stroke Mother   . Hypertension Sister   . Other Son        Homicide, stabbed  . Stroke Son   . Hypertension Son   . Diabetes Son   . Hypertension Son     Medications: Patient's Medications  New Prescriptions   No medications on file  Previous Medications   AMIODARONE (PACERONE)  200 MG TABLET    Take 200 mg by mouth daily.   AMLODIPINE (NORVASC) 10 MG TABLET    Take 1 tablet (10 mg total) by mouth daily.   ASPIRIN EC 81 MG TABLET    Take 81 mg by mouth daily.   FEEDING SUPPLEMENT, ENSURE ENLIVE, (ENSURE ENLIVE) LIQD    Take 237 mLs by mouth 2 (two) times daily between meals.   HYDRALAZINE (APRESOLINE) 100 MG TABLET    Take 1 tablet (100 mg total) by mouth 3 (three) times daily.   IRON POLYSACCHARIDES (NIFEREX) 150 MG CAPSULE    Take 1 capsule (150 mg total) by mouth daily.   LORAZEPAM (ATIVAN) 0.5 MG TABLET    Take 1 tablet (0.5 mg total) by mouth every 8 (eight) hours as needed (agitation).   METOPROLOL SUCCINATE (TOPROL-XL) 25 MG 24 HR TABLET    Take 1 tablet (25 mg total) by mouth daily.   PANTOPRAZOLE (PROTONIX) 40 MG TABLET    Take 40 mg by mouth daily.   SODIUM BICARBONATE 650 MG TABLET    Take 1 tablet (650 mg total) by mouth 2 (two) times daily.   TAMSULOSIN (FLOMAX) 0.4 MG CAPS CAPSULE    TAKE 1 CAPSULE(0.4 MG) BY MOUTH DAILY AFTER SUPPER   TRAMADOL (ULTRAM) 50 MG TABLET    Take 1 tablet (50 mg total) by mouth every 6 (six) hours as needed for moderate pain.  Modified Medications   No medications on file  Discontinued Medications   No medications on file     Physical Exam:  Vitals:   10/20/16 1412  BP: 118/64  Pulse: 64  Resp: 17  Temp: 97.7 F (36.5 C)  TempSrc: Oral  SpO2: 98%  Weight: 145 lb (65.8 kg)  Height: 6\' 1"  (1.854 m)   Body mass index is 19.13 kg/m.  Physical Exam  Constitutional: No distress.  Frail tall male in [facility], self propelling in hallway  Eyes: Pupils are equal, round, and reactive to light.  Cardiovascular: Normal rate, regular rhythm and normal heart sounds.   Pulmonary/Chest: Effort normal and breath sounds normal. No respiratory distress.  Abdominal: Soft. Bowel sounds are normal.  Musculoskeletal: Normal range of motion. He exhibits edema (1+ bilaterally).  Neurological: He is alert.  Short term memory loss  Skin:  Skin is warm and dry.  Psychiatric: He has a normal mood and affect. Cognition and memory are impaired. He exhibits abnormal recent memory and abnormal remote memory.    Labs reviewed: Basic Metabolic Panel:  Recent Labs  01/30/16 0540  09/20/16 1958 09/21/16 09/21/16 0500 09/22/16 09/22/16 0527  NA 142  < > 136 139 139 141 141  K 4.4  < > 5.8* 5.7* 5.7*  --  5.0  CL 113*  < > 107  --  112*  --  112*  CO2 21*  < > 17*  --  20*  --  23  GLUCOSE 88  < > 115*  --  110*  --  82  BUN 24*  < > 61* 56* 56* 38* 38*  CREATININE 2.97*  < > 3.14* 2.8* 2.81* 2.4* 2.39*  CALCIUM 8.3*  < > 8.6*  --  8.2*  --  7.9*  MG 2.0  --   --   --   --   --  1.9  PHOS 3.9  --   --   --   --   --   --   TSH 2.280  --   --   --   --   --   --   < > = values in this interval not displayed.  CrCl cannot be calculated (Patient's most recent lab result is older than the maximum 21 days allowed.).  Liver Function Tests:  Recent Labs  03/03/16 1604 06/06/16 1108 09/20/16 1958  AST 15 17 29   ALT 7* 8* 18  ALKPHOS 45 52 107  BILITOT 0.4 0.4 1.0  PROT 6.4 6.3 7.2  ALBUMIN 3.9 3.8 3.3*    Recent Labs  01/11/16 1600  LIPASE 36    Recent Labs  01/11/16 1600  AMMONIA 13   CBC:  Recent Labs  06/06/16 1108 09/11/16 1252  09/20/16 1958 09/21/16 09/21/16 0500 09/22/16 09/22/16 0527  WBC 2.6* 6.2  < > 7.7 6.3 6.3 5.3 5.3  NEUTROABS 1,508 5.2  --  6.6  --   --   --   --   HGB 9.8* 10.0*  < > 8.3* 6.8* 6.8*  --  7.1*  HCT 30.5* 30.8*  < > 26.4* 21* 21.3*  --  21.4*  MCV 96.8 92.5  --  97.8  --  98.2  --  95.5  PLT 196 161  < > 321 317 317  --  293  < > = values in this interval not displayed. Lipid Panel: No results for input(s): CHOL, HDL, LDLCALC, TRIG, CHOLHDL, LDLDIRECT in the last 8760 hours. TSH:  Recent Labs  01/30/16 0540  TSH 2.280   A1C: No results found for: HGBA1C   Assessment/Plan 1. Dislocation of internal left hip prosthesis, subsequent encounter -doing well with  home health, occasional pain. Family looking for long term placement at this time due to increase supervision and help needed to keep pt safe.  FL2 completed   2. Acute kidney injury superimposed on CKD ([place]) Improved on IV fluids, will follow up labs  - COMPLETE METABOLIC PANEL WITH GFR  3. Hyperkalemia -will follow up labs - COMPLETE METABOLIC PANEL WITH GFR  4. Anemia, chronic disease conts on iron, will follow up CBC - CBC with Differential/Platelets  5. Protein-calorie malnutrition, severe Weight gain noted and has been trending up. Eating better at this time. Will cont to monitor, will get CMP  6. Paroxysmal A-fib (HCC) Rate controlled, not on anticoagulation due to freq falls  - amiodarone (PACERONE) 200 MG tablet; Take 1 tablet (200 mg total) by mouth daily.  Dispense: 30 tablet; Refill: 6  7. Late onset Alzheimer's disease with behavioral disturbance -with sundowners and behaviors at time. Will start namenda (renal dose) to hopefully help with some of the behaviors to avoid ativan use.  - memantine (NAMENDA) 5 MG tablet; To start 5 mg daily by mouth for 1 week then increase to 5 mg twice daily for dementia  Dispense: 60 tablet; Refill: 1  8. Essential hypertension Stable, cont current regimen - amLODipine (NORVASC) 10 MG tablet; Take 1 tablet (10 mg total) by mouth daily.  Dispense: 30 tablet; Refill: 6 -  hydrALAZINE (APRESOLINE) 100 MG tablet; Take 1 tablet (100 mg total) by mouth 3 (three) times daily.  Dispense: 90 tablet; Refill: 0  9. Insomnia due to medical condition -melatonin has been beneficial in the past, to restart at this time.   10. Lower leg edema -due to venous insufficency, encouraged elevation, use of compression hose during the day and to limit foods high in sodium   Jessica K. Harle Battiest  [facility] [phone] 8 am - 5 pm) [phone] (after hours)

## 2016-10-21 LAB — COMPLETE METABOLIC PANEL WITH GFR
ALT: 8 U/L — ABNORMAL LOW (ref 9–46)
AST: 15 U/L (ref 10–35)
Albumin: 2.9 g/dL — ABNORMAL LOW (ref 3.6–5.1)
Alkaline Phosphatase: 129 U/L — ABNORMAL HIGH (ref 40–115)
BUN: 33 mg/dL — ABNORMAL HIGH (ref 7–25)
CO2: 21 mmol/L (ref 20–31)
Calcium: 8.1 mg/dL — ABNORMAL LOW (ref 8.6–10.3)
Chloride: 107 mmol/L (ref 98–110)
Creat: 2.49 mg/dL — ABNORMAL HIGH (ref 0.70–1.11)
GFR, Est African American: 25 mL/min — ABNORMAL LOW (ref >=60)
GFR, Est Non African American: 22 mL/min — ABNORMAL LOW (ref >=60)
Glucose, Bld: 91 mg/dL (ref 65–99)
Potassium: 5.5 mmol/L — ABNORMAL HIGH (ref 3.5–5.3)
Sodium: 138 mmol/L (ref 135–146)
Total Bilirubin: 0.4 mg/dL (ref 0.2–1.2)
Total Protein: 5.5 g/dL — ABNORMAL LOW (ref 6.1–8.1)

## 2016-10-21 LAB — CBC WITH DIFFERENTIAL/PLATELET
Basophils Absolute: 0 cells/uL (ref 0–200)
Basophils Relative: 0 %
Eosinophils Absolute: 36 cells/uL (ref 15–500)
Eosinophils Relative: 1 %
HCT: 23.8 % — ABNORMAL LOW (ref 38.5–50.0)
Hemoglobin: 7.7 g/dL — ABNORMAL LOW (ref 13.2–17.1)
Lymphocytes Relative: 24 %
Lymphs Abs: 864 cells/uL (ref 850–3900)
MCH: 31.8 pg (ref 27.0–33.0)
MCHC: 32.4 g/dL (ref 32.0–36.0)
MCV: 98.3 fL (ref 80.0–100.0)
MPV: 9.6 fL (ref 7.5–12.5)
Monocytes Absolute: 252 cells/uL (ref 200–950)
Monocytes Relative: 7 %
Neutro Abs: 2448 cells/uL (ref 1500–7800)
Neutrophils Relative %: 68 %
Platelets: 220 10*3/uL (ref 140–400)
RBC: 2.42 MIL/uL — ABNORMAL LOW (ref 4.20–5.80)
RDW: 16.1 % — ABNORMAL HIGH (ref 11.0–15.0)
WBC: 3.6 10*3/uL — ABNORMAL LOW (ref 3.8–10.8)

## 2016-10-26 ENCOUNTER — Other Ambulatory Visit: Payer: Self-pay

## 2016-10-26 ENCOUNTER — Telehealth: Payer: Self-pay

## 2016-10-26 DIAGNOSIS — N184 Chronic kidney disease, stage 4 (severe): Secondary | ICD-10-CM

## 2016-10-26 DIAGNOSIS — E875 Hyperkalemia: Secondary | ICD-10-CM

## 2016-10-26 MED ORDER — POLYSACCHARIDE IRON COMPLEX 150 MG PO CAPS: 150.0000 mg | ORAL_CAPSULE | Freq: Two times a day (BID) | ORAL | 1 refills | Status: DC

## 2016-10-26 NOTE — Telephone Encounter (Signed)
Updated rx sent to pharmacy for iron supplement, please refer to labs dated 10/20/16

## 2016-10-27 ENCOUNTER — Other Ambulatory Visit: Payer: Self-pay | Admitting: Nurse Practitioner

## 2016-11-03 ENCOUNTER — Other Ambulatory Visit: Payer: Medicare Other

## 2016-11-03 DIAGNOSIS — N184 Chronic kidney disease, stage 4 (severe): Secondary | ICD-10-CM

## 2016-11-03 DIAGNOSIS — E875 Hyperkalemia: Secondary | ICD-10-CM

## 2016-11-03 LAB — BASIC METABOLIC PANEL WITH GFR
BUN: 32 mg/dL — ABNORMAL HIGH (ref 7–25)
CO2: 18 mmol/L — ABNORMAL LOW (ref 20–31)
Calcium: 8 mg/dL — ABNORMAL LOW (ref 8.6–10.3)
Chloride: 113 mmol/L — ABNORMAL HIGH (ref 98–110)
Creat: 2.65 mg/dL — ABNORMAL HIGH (ref 0.70–1.11)
GFR, Est African American: 23 mL/min — ABNORMAL LOW (ref >=60)
GFR, Est Non African American: 20 mL/min — ABNORMAL LOW (ref >=60)
Glucose, Bld: 101 mg/dL — ABNORMAL HIGH (ref 65–99)
Potassium: 4.4 mmol/L (ref 3.5–5.3)
Sodium: 142 mmol/L (ref 135–146)

## 2016-11-07 ENCOUNTER — Encounter: Payer: Self-pay | Admitting: Internal Medicine

## 2016-11-07 ENCOUNTER — Non-Acute Institutional Stay (SKILLED_NURSING_FACILITY): Payer: Medicare Other | Admitting: Internal Medicine

## 2016-11-07 DIAGNOSIS — F02818 Dementia in other diseases classified elsewhere, unspecified severity, with other behavioral disturbance: Secondary | ICD-10-CM

## 2016-11-07 DIAGNOSIS — R600 Localized edema: Secondary | ICD-10-CM | POA: Diagnosis not present

## 2016-11-07 DIAGNOSIS — E43 Unspecified severe protein-calorie malnutrition: Secondary | ICD-10-CM | POA: Diagnosis not present

## 2016-11-07 DIAGNOSIS — R5381 Other malaise: Secondary | ICD-10-CM

## 2016-11-07 DIAGNOSIS — K219 Gastro-esophageal reflux disease without esophagitis: Secondary | ICD-10-CM

## 2016-11-07 DIAGNOSIS — I480 Paroxysmal atrial fibrillation: Secondary | ICD-10-CM | POA: Diagnosis not present

## 2016-11-07 DIAGNOSIS — I10 Essential (primary) hypertension: Secondary | ICD-10-CM

## 2016-11-07 DIAGNOSIS — G301 Alzheimer's disease with late onset: Secondary | ICD-10-CM

## 2016-11-07 DIAGNOSIS — R296 Repeated falls: Secondary | ICD-10-CM

## 2016-11-07 DIAGNOSIS — D638 Anemia in other chronic diseases classified elsewhere: Secondary | ICD-10-CM

## 2016-11-07 DIAGNOSIS — N400 Enlarged prostate without lower urinary tract symptoms: Secondary | ICD-10-CM

## 2016-11-07 DIAGNOSIS — N184 Chronic kidney disease, stage 4 (severe): Secondary | ICD-10-CM | POA: Diagnosis not present

## 2016-11-07 DIAGNOSIS — F0281 Dementia in other diseases classified elsewhere with behavioral disturbance: Secondary | ICD-10-CM

## 2016-11-07 NOTE — Progress Notes (Signed)
HISTORY AND PHYSICAL   DATE:  November 07, 2016  Location:   [facility] Room Number: [place] of Service: SNF (31)   Extended Emergency Contact Information Primary Emergency Contact: [facility] Address: [address]          [place], [place] [address] Johnnette Litter of [place] Phone: [phone] Mobile Phone: [phone] Relation: Son Secondary Emergency Contact: Foust,Avis Address: [address]          [place], [place] [address] Johnnette Litter of [place] Phone: [phone] Relation: Daughter  Advanced Directive information Does Patient Have a Medical Advance Directive?: Yes, Type of Advance Directive: Out of facility DNR (pink MOST or yellow form), Pre-existing out of facility DNR order (yellow form or pink MOST form): Pink MOST form placed in chart (order not valid for inpatient use), Does patient want to make changes to medical advance directive?: No - Patient declined  Chief Complaint  Patient presents with  . New Admit To SNF    Transfer in from Home    HPI:  83 yo male seen today as a new admission into SNF from home. He was recently admitted into the hospital for A/CKD, dehydration, and left prosthetic joint dislocation 5/1-5/3rd. He rec'd 1 unit PRBC for Hgb 6.8. He had left prosthetic hip dislocation reduction in ED. He is s/p left hip ORIF in 08/2016 after fall caused fx. For that admission, he req'd 2 units PRBCs for acute blood loss anemia following sx. He has no concerns today. Nursing staff c/a pt wandering while in w/c. He now has a wanderguard. He is a former resident of [facility]. He is a poor historian due to dementia. Hx obtained from chart  PAF/CAD/AI - rate controlled on amiodarone and metoprolol. He is followed by cardio Dr Einar Gip . He takes ASA daily for anticoagulation  HTN - BP stable on hydralazine, amlodipine, metoprolol. He takes ASA daily  BPH - urinary sx's stable on flomax  CKD - stage 4. Cr 2.65. He takes  NaHCO3 tabs for metabolic acidosis. HCO3 level 18. Followed by nephro Dr Florene Glen  Alzheimer's dementia with behavioral disturbance - late onset. mood stable on prn lorazepam. He takes namenda 34m daily. Albumin 2.9. Last MMSE 14/30 score  Protein calorie malnutrition - albumin 2.9. He gets nutritional supplements per facility protocol  GERD - stable on omeprazole  Anemia of chronic disease. Stable on iron supplement. Hgb 7.7  Hx hyperkalemia - stable. K 4.4  Chronic pain syndrome - stable on tramadol prn.   Past Medical History:  Diagnosis Date  . BPH (benign prostatic hyperplasia)   . Chronic kidney disease    ckd stage 3  . Dementia   . GERD (gastroesophageal reflux disease)   . Hemorrhoids   . History of angina   . Hyperlipemia   . Hypertension   . Irregular heart rhythm   . Paroxysmal A-fib ([facility] 04/02/2015    Past Surgical History:  Procedure Laterality Date  . BACK SURGERY    . CERVICAL SPINE SURGERY    . SPINE SURGERY      Patient Care Team: ELauree Chandler NP as PCP - General (Geriatric Medicine) ANeldon Labella NP as Nurse Practitioner (Cardiology) GAdrian Prows MD as Consulting Physician (Cardiology)  Social History   Social History  . Marital status: Married    Spouse name: N/A  . Number of children: N/A  . Years of education: N/A   Occupational History  . Not on file.  Social History Main Topics  . Smoking status: Former Smoker    Packs/day: 0.50    Years: 10.00    Types: Cigarettes  . Smokeless tobacco: Never Used     Comment: quit smoking  around 1996  . Alcohol use No     Comment: hx of occasional ETOH use  . Drug use: No  . Sexual activity: Not Currently   Other Topics Concern  . Not on file   Social History Narrative   Diet? Regular      Do you drink/eat things with caffeine? Soda, sometimes      Marital status?      widow                              What year were you married? 30 years      Do you live in a house,  apartment, assisted living, condo, trailer, etc.? house      Is it one or more stories? n/a      How many persons live in your home? 1      Do you have any pets in your home? (please list) no      Current or past profession: work in Associate Professor      Do you exercise?          no                            Type & how often?      Do you have a living will?  yes      Do you have a DNR form?     no                             If not, do you want to discuss one?      Do you have signed POA/HPOA for forms? yes              reports that he has quit smoking. His smoking use included Cigarettes. He has a 5.00 pack-year smoking history. He has never used smokeless tobacco. He reports that he does not drink alcohol or use drugs.  Family History  Problem Relation Age of Onset  . Stroke Mother   . Hypertension Sister   . Other Son        Homicide, stabbed  . Stroke Son   . Hypertension Son   . Diabetes Son   . Hypertension Son    Family Status  Relation Status  . Mother Deceased at age 86's       unknown  . Father Deceased at age 81's       unknown  . Sister Alive  . Son Deceased  . Son Alive  . Son Alive  . Son Alive  . Son Alive  . Son Alive  . Daughter Alive    Immunization History  Administered Date(s) Administered  . Influenza,inj,Quad PF,36+ Mos 04/03/2015, 01/19/2016  . PPD Test 09/22/2016  . Pneumococcal Conjugate-13 06/06/2016    No Known Allergies  Medications: Patient's Medications  New Prescriptions   No medications on file  Previous Medications   AMIODARONE (PACERONE) 200 MG TABLET    Take 1 tablet (200 mg total) by mouth daily.   AMLODIPINE (NORVASC) 10 MG TABLET    Take 1 tablet (10 mg total) by mouth  daily.   ASPIRIN EC 81 MG TABLET    Take 81 mg by mouth daily.   FERROUS SULFATE 325 (65 FE) MG TABLET    Take 325 mg by mouth daily with breakfast.   HYDRALAZINE (APRESOLINE) 100 MG TABLET    Take 1 tablet (100 mg total) by mouth 3 (three)  times daily.   LORAZEPAM (ATIVAN) 0.5 MG TABLET    Take 1 tablet (0.5 mg total) by mouth every 8 (eight) hours as needed (agitation).   MELATONIN 3 MG TABS    Take 3 mg by mouth at bedtime.   MEMANTINE (NAMENDA) 5 MG TABLET    Take 5 mg by mouth daily.   METOPROLOL SUCCINATE (TOPROL-XL) 25 MG 24 HR TABLET    Take 1 tablet (25 mg total) by mouth daily.   OMEPRAZOLE (PRILOSEC) 20 MG CAPSULE    Take 20 mg by mouth daily.   SODIUM BICARBONATE 650 MG TABLET    Take 1 tablet (650 mg total) by mouth 2 (two) times daily.   TAMSULOSIN (FLOMAX) 0.4 MG CAPS CAPSULE    TAKE 1 CAPSULE(0.4 MG) BY MOUTH DAILY AFTER SUPPER   TRAMADOL (ULTRAM) 50 MG TABLET    Take 1 tablet (50 mg total) by mouth every 6 (six) hours as needed for moderate pain.  Modified Medications   No medications on file  Discontinued Medications   FEEDING SUPPLEMENT, ENSURE ENLIVE, (ENSURE ENLIVE) LIQD    Take 237 mLs by mouth 2 (two) times daily between meals.   IRON POLYSACCHARIDES (NIFEREX) 150 MG CAPSULE    Take 1 capsule (150 mg total) by mouth 2 (two) times daily.   MEMANTINE (NAMENDA) 5 MG TABLET    To start 5 mg daily by mouth for 1 week then increase to 5 mg twice daily for dementia   PANTOPRAZOLE (PROTONIX) 40 MG TABLET    TAKE 1 TABLET BY MOUTH EVERY DAY    Review of Systems  Unable to perform ROS: Dementia    Vitals:   11/07/16 1041  BP: 130/72  Pulse: 66  Resp: 18  Temp: 99.8 F (37.7 C)  SpO2: 94%  Weight: 150 lb (68 kg)  Height: _0  (1.803 m)   Body mass index is 20.92 kg/m.  Physical Exam  Constitutional: He appears well-developed.  Frail appearing sitting in w/c in NAD  HENT:  Mouth/Throat: Oropharynx is clear and moist.  MMM; no oral thrush  Eyes: Pupils are equal, round, and reactive to light. No scleral icterus.  Neck: Neck supple. Carotid bruit is not present. No thyromegaly present.  Cardiovascular: Normal rate and intact distal pulses.  An irregularly irregular rhythm present. Exam reveals no  gallop and no friction rub.   Murmur (1/6 SEM) heard. +1 pitting b/l LE edema. No calf TTP. wanderguard intact  Pulmonary/Chest: Effort normal and breath sounds normal. He has no wheezes. He has no rales. He exhibits no tenderness.  Abdominal: Soft. Normal appearance and bowel sounds are normal. He exhibits no distension, no abdominal bruit, no pulsatile midline mass and no mass. There is no hepatomegaly. There is no tenderness. There is no rigidity, no rebound and no guarding. No hernia.  Musculoskeletal: He exhibits edema.  Lymphadenopathy:    He has no cervical adenopathy.  Neurological: He is alert.  Skin: Skin is warm and dry. No rash noted.  Psychiatric: He has a normal mood and affect. His behavior is normal.     Labs reviewed: Appointment on 11/03/2016  Component Date Value Ref  Range Status  . Sodium 11/03/2016 142  135 - 146 mmol/L Final  . Potassium 11/03/2016 4.4  3.5 - 5.3 mmol/L Final  . Chloride 11/03/2016 113* 98 - 110 mmol/L Final  . CO2 11/03/2016 18* 20 - 31 mmol/L Final  . Glucose, Bld 11/03/2016 101* 65 - 99 mg/dL Final  . BUN 11/03/2016 32* 7 - 25 mg/dL Final  . Creat 11/03/2016 2.65* 0.70 - 1.11 mg/dL Final   Comment:   For patients > or = 81 years of age: The upper reference limit for Creatinine is approximately 13% higher for people identified as African-American.     . Calcium 11/03/2016 8.0* 8.6 - 10.3 mg/dL Final  . GFR, Est African American 11/03/2016 23* >=60 mL/min Final  . GFR, Est Non African American 11/03/2016 20* >=60 mL/min Final  Office Visit on 10/20/2016  Component Date Value Ref Range Status  . WBC 10/20/2016 3.6* 3.8 - 10.8 K/uL Final  . RBC 10/20/2016 2.42* 4.20 - 5.80 MIL/uL Final  . Hemoglobin 10/20/2016 7.7* 13.2 - 17.1 g/dL Final  . HCT 10/20/2016 23.8* 38.5 - 50.0 % Final  . MCV 10/20/2016 98.3  80.0 - 100.0 fL Final  . MCH 10/20/2016 31.8  27.0 - 33.0 pg Final  . MCHC 10/20/2016 32.4  32.0 - 36.0 g/dL Final  . RDW 10/20/2016  16.1* 11.0 - 15.0 % Final  . Platelets 10/20/2016 220  140 - 400 K/uL Final  . MPV 10/20/2016 9.6  7.5 - 12.5 fL Final  . Neutro Abs 10/20/2016 2448  1,500 - 7,800 cells/uL Final  . Lymphs Abs 10/20/2016 864  850 - 3,900 cells/uL Final  . Monocytes Absolute 10/20/2016 252  200 - 950 cells/uL Final  . Eosinophils Absolute 10/20/2016 36  15 - 500 cells/uL Final  . Basophils Absolute 10/20/2016 0  0 - 200 cells/uL Final  . Neutrophils Relative % 10/20/2016 68  % Final  . Lymphocytes Relative 10/20/2016 24  % Final  . Monocytes Relative 10/20/2016 7  % Final  . Eosinophils Relative 10/20/2016 1  % Final  . Basophils Relative 10/20/2016 0  % Final  . Smear Review 10/20/2016 Criteria for review not met   Final  . Sodium 10/20/2016 138  135 - 146 mmol/L Final  . Potassium 10/20/2016 5.5* 3.5 - 5.3 mmol/L Final   No visible hemolysis.  . Chloride 10/20/2016 107  98 - 110 mmol/L Final  . CO2 10/20/2016 21  20 - 31 mmol/L Final  . Glucose, Bld 10/20/2016 91  65 - 99 mg/dL Final  . BUN 10/20/2016 33* 7 - 25 mg/dL Final  . Creat 10/20/2016 2.49* 0.70 - 1.11 mg/dL Final   Comment:   For patients > or = 81 years of age: The upper reference limit for Creatinine is approximately 13% higher for people identified as African-American.     . Total Bilirubin 10/20/2016 0.4  0.2 - 1.2 mg/dL Final  . Alkaline Phosphatase 10/20/2016 129* 40 - 115 U/L Final  . AST 10/20/2016 15  10 - 35 U/L Final  . ALT 10/20/2016 8* 9 - 46 U/L Final  . Total Protein 10/20/2016 5.5* 6.1 - 8.1 g/dL Final  . Albumin 10/20/2016 2.9* 3.6 - 5.1 g/dL Final  . Calcium 10/20/2016 8.1* 8.6 - 10.3 mg/dL Final  . GFR, Est African American 10/20/2016 25* >=60 mL/min Final  . GFR, Est Non African American 10/20/2016 22* >=60 mL/min Final  Nursing Home on 09/23/2016  Component Date Value Ref Range Status  .  Hemoglobin 09/20/2016 8.3* 13.5 - 17.5 g/dL Final  . HCT 09/20/2016 26* 41 - 53 % Final  . Platelets 09/20/2016 321  150 -  399 K/L Final  . WBC 09/20/2016 7.7  10^3/mL Final  . Glucose 09/20/2016 115  mg/dL Final  . BUN 09/20/2016 61* 4 - 21 mg/dL Final  . Creatinine 09/20/2016 3.1* 0.6 - 1.3 mg/dL Final  . Potassium 09/20/2016 5.8* 3.4 - 5.3 mmol/L Final  . Sodium 09/20/2016 136* 137 - 147 mmol/L Final  . Bilirubin, Total 09/20/2016 1.0  mg/dL Final  . Hemoglobin 09/21/2016 6.8* 13.5 - 17.5 g/dL Final  . HCT 09/21/2016 21* 41 - 53 % Final  . Platelets 09/21/2016 317  150 - 399 K/L Final  . WBC 09/21/2016 6.3  10^3/mL Final  . Glucose 09/21/2016 110  mg/dL Final  . BUN 09/21/2016 56* 4 - 21 mg/dL Final  . Creatinine 09/21/2016 2.8* 0.6 - 1.3 mg/dL Final  . Potassium 09/21/2016 5.7* 3.4 - 5.3 mmol/L Final  . Sodium 09/21/2016 139  137 - 147 mmol/L Final  . WBC 09/22/2016 5.3  10^3/mL Final  . Glucose 09/22/2016 82  mg/dL Final  . BUN 09/22/2016 38* 4 - 21 mg/dL Final  . Creatinine 09/22/2016 2.4* 0.6 - 1.3 mg/dL Final  . Sodium 09/22/2016 141  137 - 147 mmol/L Final  Admission on 09/20/2016, Discharged on 09/22/2016  Component Date Value Ref Range Status  . WBC 09/20/2016 7.7  4.0 - 10.5 K/uL Final  . RBC 09/20/2016 2.70* 4.22 - 5.81 MIL/uL Final  . Hemoglobin 09/20/2016 8.3* 13.0 - 17.0 g/dL Final  . HCT 09/20/2016 26.4* 39.0 - 52.0 % Final  . MCV 09/20/2016 97.8  78.0 - 100.0 fL Final  . MCH 09/20/2016 30.7  26.0 - 34.0 pg Final  . MCHC 09/20/2016 31.4  30.0 - 36.0 g/dL Final  . RDW 09/20/2016 16.2* 11.5 - 15.5 % Final  . Platelets 09/20/2016 321  150 - 400 K/uL Final  . Neutrophils Relative % 09/20/2016 85  % Final  . Neutro Abs 09/20/2016 6.6  1.7 - 7.7 K/uL Final  . Lymphocytes Relative 09/20/2016 9  % Final  . Lymphs Abs 09/20/2016 0.7  0.7 - 4.0 K/uL Final  . Monocytes Relative 09/20/2016 6  % Final  . Monocytes Absolute 09/20/2016 0.4  0.1 - 1.0 K/uL Final  . Eosinophils Relative 09/20/2016 0  % Final  . Eosinophils Absolute 09/20/2016 0.0  0.0 - 0.7 K/uL Final  . Basophils Relative  09/20/2016 0  % Final  . Basophils Absolute 09/20/2016 0.0  0.0 - 0.1 K/uL Final  . Sodium 09/20/2016 136  135 - 145 mmol/L Final  . Potassium 09/20/2016 5.8* 3.5 - 5.1 mmol/L Final  . Chloride 09/20/2016 107  101 - 111 mmol/L Final  . CO2 09/20/2016 17* 22 - 32 mmol/L Final  . Glucose, Bld 09/20/2016 115* 65 - 99 mg/dL Final  . BUN 09/20/2016 61* 6 - 20 mg/dL Final  . Creatinine, Ser 09/20/2016 3.14* 0.61 - 1.24 mg/dL Final  . Calcium 09/20/2016 8.6* 8.9 - 10.3 mg/dL Final  . Total Protein 09/20/2016 7.2  6.5 - 8.1 g/dL Final  . Albumin 09/20/2016 3.3* 3.5 - 5.0 g/dL Final  . AST 09/20/2016 29  15 - 41 U/L Final  . ALT 09/20/2016 18  17 - 63 U/L Final  . Alkaline Phosphatase 09/20/2016 107  38 - 126 U/L Final  . Total Bilirubin 09/20/2016 1.0  0.3 - 1.2 mg/dL Final  .  GFR calc non Af Amer 09/20/2016 16* >60 mL/min Final  . GFR calc Af Amer 09/20/2016 19* >60 mL/min Final   Comment: (NOTE) The eGFR has been calculated using the CKD EPI equation. This calculation has not been validated in all clinical situations. eGFR's persistently <60 mL/min signify possible Chronic Kidney Disease.   . Anion gap 09/20/2016 12  5 - 15 Final  . Color, Urine 09/20/2016 YELLOW  YELLOW Final  . APPearance 09/20/2016 CLOUDY* CLEAR Final  . Specific Gravity, Urine 09/20/2016 1.014  1.005 - 1.030 Final  . pH 09/20/2016 5.0  5.0 - 8.0 Final  . Glucose, UA 09/20/2016 NEGATIVE  NEGATIVE mg/dL Final  . Hgb urine dipstick 09/20/2016 MODERATE* NEGATIVE Final  . Bilirubin Urine 09/20/2016 NEGATIVE  NEGATIVE Final  . Ketones, ur 09/20/2016 NEGATIVE  NEGATIVE mg/dL Final  . Protein, ur 09/20/2016 100* NEGATIVE mg/dL Final  . Nitrite 09/20/2016 NEGATIVE  NEGATIVE Final  . Leukocytes, UA 09/20/2016 NEGATIVE  NEGATIVE Final  . RBC / HPF 09/20/2016 TOO NUMEROUS TO COUNT  0 - 5 RBC/hpf Final  . WBC, UA 09/20/2016 6-30  0 - 5 WBC/hpf Final  . Bacteria, UA 09/20/2016 RARE* NONE SEEN Final  . Squamous Epithelial / LPF  09/20/2016 0-5* NONE SEEN Final  . Mucous 09/20/2016 PRESENT   Final  . Hyaline Casts, UA 09/20/2016 PRESENT   Final  . Amorphous Crystal 09/20/2016 PRESENT   Final  . Specimen Description 09/20/2016 URINE, RANDOM   Final  . Special Requests 09/20/2016 NONE   Final  . Culture 09/20/2016    Final                   Value:NO GROWTH Performed at [facility], [place] [address] [place], [place] [address]   . Report Status 09/20/2016 09/22/2016 FINAL   Final  . Sodium 09/21/2016 139  135 - 145 mmol/L Final  . Potassium 09/21/2016 5.7* 3.5 - 5.1 mmol/L Final  . Chloride 09/21/2016 112* 101 - 111 mmol/L Final  . CO2 09/21/2016 20* 22 - 32 mmol/L Final  . Glucose, Bld 09/21/2016 110* 65 - 99 mg/dL Final  . BUN 09/21/2016 56* 6 - 20 mg/dL Final  . Creatinine, Ser 09/21/2016 2.81* 0.61 - 1.24 mg/dL Final  . Calcium 09/21/2016 8.2* 8.9 - 10.3 mg/dL Final  . GFR calc non Af Amer 09/21/2016 18* >60 mL/min Final  . GFR calc Af Amer 09/21/2016 21* >60 mL/min Final   Comment: (NOTE) The eGFR has been calculated using the CKD EPI equation. This calculation has not been validated in all clinical situations. eGFR's persistently <60 mL/min signify possible Chronic Kidney Disease.   . Anion gap 09/21/2016 7  5 - 15 Final  . WBC 09/21/2016 6.3  4.0 - 10.5 K/uL Final  . RBC 09/21/2016 2.17* 4.22 - 5.81 MIL/uL Final  . Hemoglobin 09/21/2016 6.8* 13.0 - 17.0 g/dL Final   Comment: REPEATED TO VERIFY CRITICAL RESULT CALLED TO, READ BACK BY AND VERIFIED WITH: LAY,T RN 5.2.18 _0  ZANDO,C   . HCT 09/21/2016 21.3* 39.0 - 52.0 % Final  . MCV 09/21/2016 98.2  78.0 - 100.0 fL Final  . MCH 09/21/2016 31.3  26.0 - 34.0 pg Final  . MCHC 09/21/2016 31.9  30.0 - 36.0 g/dL Final  . RDW 09/21/2016 15.9* 11.5 - 15.5 % Final  . Platelets 09/21/2016 317  150 - 400 K/uL Final  . ABO/RH(D) 09/21/2016 O POS   Final  . Antibody Screen 09/21/2016 NEG  Final  . Sample Expiration 09/21/2016 09/24/2016   Final  . Unit  Number 09/21/2016 [id number]   Final  . Blood Component Type 09/21/2016 RED CELLS,LR   Final  . Unit division 09/21/2016 00   Final  . Status of Unit 09/21/2016 ISSUED,FINAL   Final  . Transfusion Status 09/21/2016 OK TO TRANSFUSE   Final  . Crossmatch Result 09/21/2016 Compatible   Final  . Unit Number 09/21/2016 [id number]   Final  . Blood Component Type 09/21/2016 RED CELLS,LR   Final  . Unit division 09/21/2016 00   Final  . Status of Unit 09/21/2016 REL FROM [facility]   Final  . Transfusion Status 09/21/2016 OK TO TRANSFUSE   Final  . Crossmatch Result 09/21/2016 Compatible   Final  . Order Confirmation 09/21/2016 ORDER PROCESSED BY BLOOD BANK   Final  . ABO/RH(D) 09/21/2016 O POS   Final  . MRSA by PCR 09/21/2016 NEGATIVE  NEGATIVE Final   Comment:        The GeneXpert MRSA Assay (FDA approved for NASAL specimens only), is one component of a comprehensive MRSA colonization surveillance program. It is not intended to diagnose MRSA infection nor to guide or monitor treatment for MRSA infections.   . Order Confirmation 09/21/2016 ORDER PROCESSED BY BLOOD BANK   Final  . ISSUE DATE / TIME 09/21/2016 [id number]   Final  . Blood Product Unit Number 09/21/2016 [id number]   Final  . PRODUCT CODE 09/21/2016 [id number]   Final  . Unit Type and Rh 09/21/2016 5100   Final  . Blood Product Expiration Date 09/21/2016 [id number]   Final  . Blood Product Unit Number 09/21/2016 [id number]   Final  . Unit Type and Rh 09/21/2016 5100   Final  . Blood Product Expiration Date 09/21/2016 [id number]   Final  . WBC 09/22/2016 5.3  4.0 - 10.5 K/uL Final  . RBC 09/22/2016 2.24* 4.22 - 5.81 MIL/uL Final  . Hemoglobin 09/22/2016 7.1* 13.0 - 17.0 g/dL Final  . HCT 09/22/2016 21.4* 39.0 - 52.0 % Final  . MCV 09/22/2016 95.5  78.0 - 100.0 fL Final  . MCH 09/22/2016 31.7  26.0 - 34.0 pg Final  . MCHC 09/22/2016 33.2  30.0 - 36.0 g/dL Final  . RDW 09/22/2016 18.3* 11.5 - 15.5 % Final  .  Platelets 09/22/2016 293  150 - 400 K/uL Final  . Sodium 09/22/2016 141  135 - 145 mmol/L Final  . Potassium 09/22/2016 5.0  3.5 - 5.1 mmol/L Final  . Chloride 09/22/2016 112* 101 - 111 mmol/L Final  . CO2 09/22/2016 23  22 - 32 mmol/L Final  . Glucose, Bld 09/22/2016 82  65 - 99 mg/dL Final  . BUN 09/22/2016 38* 6 - 20 mg/dL Final  . Creatinine, Ser 09/22/2016 2.39* 0.61 - 1.24 mg/dL Final  . Calcium 09/22/2016 7.9* 8.9 - 10.3 mg/dL Final  . GFR calc non Af Amer 09/22/2016 22* >60 mL/min Final  . GFR calc Af Amer 09/22/2016 26* >60 mL/min Final   Comment: (NOTE) The eGFR has been calculated using the CKD EPI equation. This calculation has not been validated in all clinical situations. eGFR's persistently <60 mL/min signify possible Chronic Kidney Disease.   . Anion gap 09/22/2016 6  5 - 15 Final  . Magnesium 09/22/2016 1.9  1.7 - 2.4 mg/dL Final  Admission on 09/11/2016, Discharged on 09/11/2016  Component Date Value Ref Range Status  . Color, Urine 09/11/2016 YELLOW  YELLOW Final  . APPearance 09/11/2016 CLEAR  CLEAR Final  . Specific Gravity, Urine 09/11/2016 1.012  1.005 - 1.030 Final  . pH 09/11/2016 5.0  5.0 - 8.0 Final  . Glucose, UA 09/11/2016 NEGATIVE  NEGATIVE mg/dL Final  . Hgb urine dipstick 09/11/2016 MODERATE* NEGATIVE Final  . Bilirubin Urine 09/11/2016 NEGATIVE  NEGATIVE Final  . Ketones, ur 09/11/2016 NEGATIVE  NEGATIVE mg/dL Final  . Protein, ur 09/11/2016 100* NEGATIVE mg/dL Final  . Nitrite 09/11/2016 NEGATIVE  NEGATIVE Final  . Leukocytes, UA 09/11/2016 TRACE* NEGATIVE Final  . RBC / HPF 09/11/2016 6-30  0 - 5 RBC/hpf Final  . WBC, UA 09/11/2016 0-5  0 - 5 WBC/hpf Final  . Bacteria, UA 09/11/2016 RARE* NONE SEEN Final  . Squamous Epithelial / LPF 09/11/2016 NONE SEEN  NONE SEEN Final  . Mucous 09/11/2016 PRESENT   Final  . Sodium 09/11/2016 140  135 - 145 mmol/L Final  . Potassium 09/11/2016 4.9  3.5 - 5.1 mmol/L Final  . Chloride 09/11/2016 109  101 - 111  mmol/L Final  . CO2 09/11/2016 22  22 - 32 mmol/L Final  . Glucose, Bld 09/11/2016 106* 65 - 99 mg/dL Final  . BUN 09/11/2016 42* 6 - 20 mg/dL Final  . Creatinine, Ser 09/11/2016 2.34* 0.61 - 1.24 mg/dL Final  . Calcium 09/11/2016 8.9  8.9 - 10.3 mg/dL Final  . GFR calc non Af Amer 09/11/2016 23* >60 mL/min Final  . GFR calc Af Amer 09/11/2016 27* >60 mL/min Final   Comment: (NOTE) The eGFR has been calculated using the CKD EPI equation. This calculation has not been validated in all clinical situations. eGFR's persistently <60 mL/min signify possible Chronic Kidney Disease.   . Anion gap 09/11/2016 9  5 - 15 Final  . WBC 09/11/2016 6.2  4.0 - 10.5 K/uL Final  . RBC 09/11/2016 3.33* 4.22 - 5.81 MIL/uL Final  . Hemoglobin 09/11/2016 10.0* 13.0 - 17.0 g/dL Final  . HCT 09/11/2016 30.8* 39.0 - 52.0 % Final  . MCV 09/11/2016 92.5  78.0 - 100.0 fL Final  . MCH 09/11/2016 30.0  26.0 - 34.0 pg Final  . MCHC 09/11/2016 32.5  30.0 - 36.0 g/dL Final  . RDW 09/11/2016 16.6* 11.5 - 15.5 % Final  . Platelets 09/11/2016 161  150 - 400 K/uL Final  . Neutrophils Relative % 09/11/2016 83  % Final  . Neutro Abs 09/11/2016 5.2  1.7 - 7.7 K/uL Final  . Lymphocytes Relative 09/11/2016 9  % Final  . Lymphs Abs 09/11/2016 0.5* 0.7 - 4.0 K/uL Final  . Monocytes Relative 09/11/2016 8  % Final  . Monocytes Absolute 09/11/2016 0.5  0.1 - 1.0 K/uL Final  . Eosinophils Relative 09/11/2016 0  % Final  . Eosinophils Absolute 09/11/2016 0.0  0.0 - 0.7 K/uL Final  . Basophils Relative 09/11/2016 0  % Final  . Basophils Absolute 09/11/2016 0.0  0.0 - 0.1 K/uL Final    No results found.   Assessment/Plan   ICD-10-CM   1. Physical deconditioning R53.81   2. Protein-calorie malnutrition, severe E43   3. Anemia, chronic disease D63.8   4. CKD (chronic kidney disease) stage 4, GFR 15-29 ml/min (HCC) N18.4   5. Paroxysmal atrial fibrillation (HCC) I48.0   6. Frequent falls R29.6   7. Gastroesophageal reflux  disease without esophagitis K21.9   8. Late onset Alzheimer's disease with behavioral disturbance G30.1    F02.81   9. Essential hypertension I10   10. Lower leg edema R60.0   11. Benign  prostatic hyperplasia, unspecified whether lower urinary tract symptoms present N40.0      Check CBC, CMP, TSH, B12 level in 1 month  Cont current meds as ordered  PT/OT/ST as ordered  Nutritional supplements as ordered   Fall precautions  F/u with cardio and nephro as scheduled  GOAL: short term rehab then long term care. Communicated with pt and nursing.  Will follow  Monica S. Perlie Gold  [facility] [address] [place], [place] [address] [phone] Cell (Monday-Friday 8 AM - 5 PM) [phone] After 5 PM and follow prompts

## 2016-11-16 ENCOUNTER — Non-Acute Institutional Stay (SKILLED_NURSING_FACILITY): Payer: Medicare Other | Admitting: Adult Health

## 2016-11-16 ENCOUNTER — Encounter: Payer: Self-pay | Admitting: Adult Health

## 2016-11-16 DIAGNOSIS — I480 Paroxysmal atrial fibrillation: Secondary | ICD-10-CM | POA: Diagnosis not present

## 2016-11-16 DIAGNOSIS — T84021D Dislocation of internal left hip prosthesis, subsequent encounter: Secondary | ICD-10-CM | POA: Diagnosis not present

## 2016-11-16 DIAGNOSIS — F0281 Dementia in other diseases classified elsewhere with behavioral disturbance: Secondary | ICD-10-CM

## 2016-11-16 DIAGNOSIS — N184 Chronic kidney disease, stage 4 (severe): Secondary | ICD-10-CM

## 2016-11-16 DIAGNOSIS — G301 Alzheimer's disease with late onset: Secondary | ICD-10-CM | POA: Diagnosis not present

## 2016-11-16 DIAGNOSIS — F02818 Dementia in other diseases classified elsewhere, unspecified severity, with other behavioral disturbance: Secondary | ICD-10-CM

## 2016-11-16 NOTE — Progress Notes (Signed)
Location:   [place] Room Number: 216 A Place of Service:  SNF (31)    CODE STATUS: Full Code  No Known Allergies  Chief Complaint  Patient presents with  . Discharge Note    Discharging    HPI:  He is being discharged to assisted living. He will need home health for pt/ot. He will need a standard wheelchair. He will need his prescriptions written. He will follow up with the medical provider at the assisted living.  He had been admitted to this facility from home. He has recently had a hip replacement with a dislocation.    Past Medical History:  Diagnosis Date  . BPH (benign prostatic hyperplasia)   . Chronic kidney disease    ckd stage 3  . Dementia   . GERD (gastroesophageal reflux disease)   . Hemorrhoids   . History of angina   . Hyperlipemia   . Hypertension   . Irregular heart rhythm   . Paroxysmal A-fib ([place]) 04/02/2015    Past Surgical History:  Procedure Laterality Date  . BACK SURGERY    . CERVICAL SPINE SURGERY    . SPINE SURGERY      Social History   Social History  . Marital status: Married    Spouse name: N/A  . Number of children: N/A  . Years of education: N/A   Occupational History  . Not on file.   Social History Main Topics  . Smoking status: Former Smoker    Packs/day: 0.50    Years: 10.00    Types: Cigarettes  . Smokeless tobacco: Never Used     Comment: quit smoking  around 1996  . Alcohol use No     Comment: hx of occasional ETOH use  . Drug use: No  . Sexual activity: Not Currently   Other Topics Concern  . Not on file   Social History Narrative   Diet? Regular      Do you drink/eat things with caffeine? Soda, sometimes      Marital status?      widow                              What year were you married? 87 years      Do you live in a house, apartment, assisted living, condo, trailer, etc.? house      Is it one or more stories? n/a      How many persons live in your home? 1      Do you have  any pets in your home? (please list) no      Current or past profession: work in Associate Professor      Do you exercise?          no                            Type & how often?      Do you have a living will?  yes      Do you have a DNR form?     no                             If not, do you want to discuss one?      Do you have signed POA/HPOA for forms? yes  Family History  Problem Relation Age of Onset  . Stroke Mother   . Hypertension Sister   . Other Son        Homicide, stabbed  . Stroke Son   . Hypertension Son   . Diabetes Son   . Hypertension Son     VITAL SIGNS BP (!) 145/75   Pulse 86   Temp 98.7 F (37.1 C)   Resp 18   Ht _0  (1.803 m)   Wt 150 lb (68 kg)   SpO2 97%   BMI 20.92 kg/m   Patient's Medications  New Prescriptions   No medications on file  Previous Medications   AMIODARONE (PACERONE) 200 MG TABLET    Take 1 tablet (200 mg total) by mouth daily.   AMLODIPINE (NORVASC) 10 MG TABLET    Take 1 tablet (10 mg total) by mouth daily.   ASPIRIN EC 81 MG TABLET    Take 81 mg by mouth daily.   FERROUS SULFATE 325 (65 FE) MG TABLET    Take 325 mg by mouth daily with breakfast.   HYDRALAZINE (APRESOLINE) 100 MG TABLET    Take 1 tablet (100 mg total) by mouth 3 (three) times daily.   LORAZEPAM (ATIVAN) 0.5 MG TABLET    Take 1 tablet (0.5 mg total) by mouth every 8 (eight) hours as needed (agitation).   MELATONIN 3 MG TABS    Take 3 mg by mouth at bedtime.   MEMANTINE (NAMENDA) 5 MG TABLET    Take 5 mg by mouth daily.   METOPROLOL SUCCINATE (TOPROL-XL) 25 MG 24 HR TABLET    Take 1 tablet (25 mg total) by mouth daily.   OMEPRAZOLE (PRILOSEC) 20 MG CAPSULE    Take 20 mg by mouth daily.   SODIUM BICARBONATE 650 MG TABLET    Take 1 tablet (650 mg total) by mouth 2 (two) times daily.   TAMSULOSIN (FLOMAX) 0.4 MG CAPS CAPSULE    TAKE 1 CAPSULE(0.4 MG) BY MOUTH DAILY AFTER SUPPER   TRAMADOL (ULTRAM) 50 MG TABLET    Take 1 tablet (50 mg total) by  mouth every 6 (six) hours as needed for moderate pain.  Modified Medications   No medications on file  Discontinued Medications   No medications on file     SIGNIFICANT DIAGNOSTIC EXAMS   LABS REVIEWED:   10-20-16:  Wbc 3.6; hgb 7.7; hct 23.8; mcv 98.3; plt 220; glucose 91; bun 33; creat 2.49; k+ 5.5; na++ 138; ca 8.1; alk phos 129; albumin 2.9     Review of Systems  Unable to perform ROS: Dementia   Physical Exam  Constitutional: No distress.  Thin   Eyes: Conjunctivae are normal.  Neck: Neck supple. No JVD present. No thyromegaly present.  Cardiovascular: Normal rate and intact distal pulses.   Murmur heard. Heart rate irregular   Respiratory: Effort normal and breath sounds normal. No respiratory distress. He has no wheezes.  GI: Soft. Bowel sounds are normal. He exhibits no distension. There is no tenderness.  Musculoskeletal: He exhibits edema.  Able to move all extremities  1+ bilateral lower extremity edema   Lymphadenopathy:    He has no cervical adenopathy.  Neurological: He is alert.  Skin: Skin is warm and dry. He is not diaphoretic.  Psychiatric: He has a normal mood and affect.     ASSESSMENT/ PLAN:  Patient is being discharged with the following home health services: pt/ot: evaluate and treat as indicated for gait strength balance adl  training    Patient is being discharged with the following durable medical equipment:  Standard wheelchair with leg rests; cushion; antitippers and brake extensions to allow patient to maintain current level of independence with adls which cannot be achieved with a walker. Can self propel   Patient has been advised to f/u with their PCP in 1-2 weeks to bring them up to date on their rehab stay.  Social services at facility was responsible for arranging this appointment.  Pt was provided with a 30 day supply of prescriptions for medications and refills must be obtained from their PCP.  For controlled substances, a more limited  supply may be provided adequate until PCP appointment only. #30 ativan 0.5 mg tabs #30 ultram 50 mg tabs.    Time spent with patient  45   minutes >50% time spent counseling; reviewing medical record; tests; labs; and developing future plan of care    Ok Edwards NP [facility]  Contact [phone] Monday through Friday 8am- 5pm  After hours call [phone]

## 2016-11-29 ENCOUNTER — Telehealth: Payer: Self-pay

## 2016-11-29 NOTE — Telephone Encounter (Signed)
A message was left on clinical intake line from Lottie with [facility] stating that they were yet to receive the signed order for the plan of treatment that was sent in May.   After speaking with Geri in medical records, it was determined that order was placed by Dr. Inocencio Homes when pt was discharged from [facility]. The order keeps getting sent to Dr. Mariea Clonts who cannot sign the order. I asked that the order be sent to our office in Dr. Redgie Grayer name for signing. Nanine Means will resend requested orders in Dr. Redgie Grayer name.

## 2016-12-07 ENCOUNTER — Encounter: Payer: Self-pay | Admitting: Adult Health

## 2016-12-07 ENCOUNTER — Non-Acute Institutional Stay (SKILLED_NURSING_FACILITY): Payer: Medicare Other | Admitting: Adult Health

## 2016-12-07 DIAGNOSIS — I10 Essential (primary) hypertension: Secondary | ICD-10-CM | POA: Diagnosis not present

## 2016-12-07 DIAGNOSIS — T84021S Dislocation of internal left hip prosthesis, sequela: Secondary | ICD-10-CM | POA: Diagnosis not present

## 2016-12-07 DIAGNOSIS — F02818 Dementia in other diseases classified elsewhere, unspecified severity, with other behavioral disturbance: Secondary | ICD-10-CM

## 2016-12-07 DIAGNOSIS — E872 Acidosis, unspecified: Secondary | ICD-10-CM

## 2016-12-07 DIAGNOSIS — K219 Gastro-esophageal reflux disease without esophagitis: Secondary | ICD-10-CM

## 2016-12-07 DIAGNOSIS — G301 Alzheimer's disease with late onset: Secondary | ICD-10-CM

## 2016-12-07 DIAGNOSIS — D638 Anemia in other chronic diseases classified elsewhere: Secondary | ICD-10-CM | POA: Diagnosis not present

## 2016-12-07 DIAGNOSIS — N184 Chronic kidney disease, stage 4 (severe): Secondary | ICD-10-CM | POA: Diagnosis not present

## 2016-12-07 DIAGNOSIS — F0281 Dementia in other diseases classified elsewhere with behavioral disturbance: Secondary | ICD-10-CM

## 2016-12-07 DIAGNOSIS — I480 Paroxysmal atrial fibrillation: Secondary | ICD-10-CM

## 2016-12-07 LAB — CBC AND DIFFERENTIAL
HCT: 27 — AB (ref 41–53)
Hemoglobin: 9.2 — AB (ref 13.5–17.5)
Neutrophils Absolute: 1
Platelets: 142 — AB (ref 150–399)
WBC: 2.6

## 2016-12-07 LAB — BASIC METABOLIC PANEL
BUN: 36 — AB (ref 4–21)
Creatinine: 2.3 — AB (ref 0.6–1.3)
Glucose: 87
Potassium: 4.5 (ref 3.4–5.3)
Sodium: 142 (ref 137–147)

## 2016-12-07 LAB — HEPATIC FUNCTION PANEL
ALT: 7 — AB (ref 10–40)
AST: 13 — AB (ref 14–40)
Alkaline Phosphatase: 103 (ref 25–125)
Bilirubin, Total: 0.3

## 2016-12-07 LAB — TSH: TSH: 2.91 (ref 0.41–5.90)

## 2016-12-07 LAB — VITAMIN B12: Vitamin B-12: 203

## 2016-12-07 NOTE — Progress Notes (Signed)
Location:   [place] Room Number: 216 A Place of Service:  SNF (31)   CODE STATUS: Full Code  No Known Allergies  Chief Complaint  Patient presents with  . Medical Management of Chronic Issues    1 month follow up    HPI:  He is a 83 year old long term resident of this facility being seen for the management of his chronic illnesses. He has not yet discharged which is still pending. He is unable to fully participate in the hpi or ros. Today he is bed resting and tells me that he is feeling good. There are no nursing concerns at this time.    Past Medical History:  Diagnosis Date  . BPH (benign prostatic hyperplasia)   . Chronic kidney disease    ckd stage 3  . Dementia   . GERD (gastroesophageal reflux disease)   . Hemorrhoids   . History of angina   . Hyperlipemia   . Hypertension   . Irregular heart rhythm   . Paroxysmal A-fib ([place]) 04/02/2015    Past Surgical History:  Procedure Laterality Date  . BACK SURGERY    . CERVICAL SPINE SURGERY    . SPINE SURGERY      Social History   Social History  . Marital status: Married    Spouse name: N/A  . Number of children: N/A  . Years of education: N/A   Occupational History  . Not on file.   Social History Main Topics  . Smoking status: Former Smoker    Packs/day: 0.50    Years: 10.00    Types: Cigarettes  . Smokeless tobacco: Never Used     Comment: quit smoking  around 1996  . Alcohol use No     Comment: hx of occasional ETOH use  . Drug use: No  . Sexual activity: Not Currently   Other Topics Concern  . Not on file   Social History Narrative   Diet? Regular      Do you drink/eat things with caffeine? Soda, sometimes      Marital status?      widow                              What year were you married? 50 years      Do you live in a house, apartment, assisted living, condo, trailer, etc.? house      Is it one or more stories? n/a      How many persons live in your home? 1     Do you have any pets in your home? (please list) no      Current or past profession: work in Associate Professor      Do you exercise?          no                            Type & how often?      Do you have a living will?  yes      Do you have a DNR form?     no                             If not, do you want to discuss one?      Do you have signed POA/HPOA for forms? yes  Family History  Problem Relation Age of Onset  . Stroke Mother   . Hypertension Sister   . Other Son        Homicide, stabbed  . Stroke Son   . Hypertension Son   . Diabetes Son   . Hypertension Son       VITAL SIGNS BP 136/72   Pulse 80   Temp 97.7 F (36.5 C)   Resp 18   Ht 5' 11"  (1.803 m)   Wt 147 lb 9.6 oz (67 kg)   SpO2 99%   BMI 20.59 kg/m   Patient's Medications  New Prescriptions   No medications on file  Previous Medications   AMIODARONE (PACERONE) 200 MG TABLET    Take 1 tablet (200 mg total) by mouth daily.   AMLODIPINE (NORVASC) 10 MG TABLET    Take 1 tablet (10 mg total) by mouth daily.   ASPIRIN EC 81 MG TABLET    Take 81 mg by mouth daily.   FERROUS SULFATE 325 (65 FE) MG TABLET    Take 325 mg by mouth daily with breakfast.   HYDRALAZINE (APRESOLINE) 100 MG TABLET    Take 1 tablet (100 mg total) by mouth 3 (three) times daily.   LORAZEPAM (ATIVAN) 0.5 MG TABLET    Take 1 tablet (0.5 mg total) by mouth every 8 (eight) hours as needed (agitation).   MELATONIN 3 MG TABS    Take 3 mg by mouth at bedtime.   MEMANTINE (NAMENDA) 5 MG TABLET    Take 5 mg by mouth daily.   METOPROLOL SUCCINATE (TOPROL-XL) 25 MG 24 HR TABLET    Take 1 tablet (25 mg total) by mouth daily.   OMEPRAZOLE (PRILOSEC) 20 MG CAPSULE    Take 20 mg by mouth daily.   SODIUM BICARBONATE 650 MG TABLET    Take 1 tablet (650 mg total) by mouth 2 (two) times daily.   TAMSULOSIN (FLOMAX) 0.4 MG CAPS CAPSULE    TAKE 1 CAPSULE(0.4 MG) BY MOUTH DAILY AFTER SUPPER   TRAMADOL (ULTRAM) 50 MG TABLET    Take 1  tablet (50 mg total) by mouth every 6 (six) hours as needed for moderate pain.  Modified Medications   No medications on file  Discontinued Medications   No medications on file     SIGNIFICANT DIAGNOSTIC EXAMS  LABS REVIEWED:   10-20-16:  Wbc 3.6; hgb 7.7; hct 23.8; mcv 98.3; plt 220; glucose 91; bun 33; creat 2.49; k+ 5.5; na++ 138; ca 8.1; alk phos 129; albumin 2.9     Review of Systems  Unable to perform ROS: Dementia   Physical Exam  Constitutional: No distress.  Thin   Eyes: Conjunctivae are normal.  Neck: Neck supple. No JVD present. No thyromegaly present.  Cardiovascular: Normal rate and intact distal pulses.   Murmur heard. Heart rate irregular   Respiratory: Effort normal and breath sounds normal. No respiratory distress. He has no wheezes.  GI: Soft. Bowel sounds are normal. He exhibits no distension. There is no tenderness.  Musculoskeletal: He exhibits edema.  Able to move all extremities  trace bilateral lower extremity edema   Lymphadenopathy:    He has no cervical adenopathy.  Neurological: He is alert.  Skin: Skin is warm and dry. He is not diaphoretic.  Psychiatric: He has a normal mood and affect.     ASSESSMENT/ PLAN:  1. Hypertension: b/p 136/72;  will continue norvasc 10 mg daily toprol xl 25 mg daily  Apresoline 100  mg three times daily   2. Afib: heart rate stable will continue amiodarone 200 mg daily and toprol xl 25 mg daily for rate control will continue asa 81 mg daily  3. Anemia: hgb 7.7 will continue iron daily   4. Gerd: will continue prilosec 20 mg daily   5. Dementia: weight is 147 pounds; will continue namenda 5 mg daily  6. CKD: stage IV: with metabolic acidosis:  bun 33; creat 2.49; will continue sodium bicarbonate 650 mg twice daily  7. BPH: will continue flomax 0.4 mg daily  8. Anxiety: will continue ativan 0.5 mg every 8 hours as needed for his episodic episodes of anxiety; which do not occur on a routine basis. Is taking  melatonin 3 mg nightly for sleep  9. Dislocation of internal left hip prosthesis: will continue ultram 50 mg every 6 hours as needed for pain management      MD is aware of resident's narcotic use and is in agreement with current plan of care. We will attempt to wean resident as apropriate     Ok Edwards NP [facility]  Contact [phone] Monday through Friday 8am- 5pm  After hours call [phone]

## 2016-12-08 ENCOUNTER — Encounter: Payer: Self-pay | Admitting: Adult Health

## 2016-12-08 ENCOUNTER — Non-Acute Institutional Stay (SKILLED_NURSING_FACILITY): Payer: Medicare Other | Admitting: Adult Health

## 2016-12-08 DIAGNOSIS — T84021S Dislocation of internal left hip prosthesis, sequela: Secondary | ICD-10-CM

## 2016-12-08 DIAGNOSIS — G301 Alzheimer's disease with late onset: Secondary | ICD-10-CM | POA: Diagnosis not present

## 2016-12-08 DIAGNOSIS — I10 Essential (primary) hypertension: Secondary | ICD-10-CM

## 2016-12-08 DIAGNOSIS — F0281 Dementia in other diseases classified elsewhere with behavioral disturbance: Secondary | ICD-10-CM

## 2016-12-08 DIAGNOSIS — I480 Paroxysmal atrial fibrillation: Secondary | ICD-10-CM | POA: Diagnosis not present

## 2016-12-08 DIAGNOSIS — F02818 Dementia in other diseases classified elsewhere, unspecified severity, with other behavioral disturbance: Secondary | ICD-10-CM

## 2016-12-08 NOTE — Progress Notes (Addendum)
Location:   [place] Room Number: 212 A Place of Service:  SNF (31)    CODE STATUS: Full Code  No Known Allergies  Chief Complaint  Patient presents with  . Discharge Note    Discharging to Assisted Living    HPI:  He is being discharged to assisted living; as he no longer requires SNF care. He will need home health for pt/ot. He will follow up medically with the provider at the receiving facility. He will not need dme as the assisted living will provide. He will need his narcotic prescriptions written.    Past Medical History:  Diagnosis Date  . BPH (benign prostatic hyperplasia)   . Chronic kidney disease    ckd stage 3  . Dementia   . GERD (gastroesophageal reflux disease)   . Hemorrhoids   . History of angina   . Hyperlipemia   . Hypertension   . Irregular heart rhythm   . Paroxysmal A-fib ([employer]) 04/02/2015    Past Surgical History:  Procedure Laterality Date  . BACK SURGERY    . CERVICAL SPINE SURGERY    . SPINE SURGERY      Social History   Social History  . Marital status: Married    Spouse name: N/A  . Number of children: N/A  . Years of education: N/A   Occupational History  . Not on file.   Social History Main Topics  . Smoking status: Former Smoker    Packs/day: 0.50    Years: 10.00    Types: Cigarettes  . Smokeless tobacco: Never Used     Comment: quit smoking  around 1996  . Alcohol use No     Comment: hx of occasional ETOH use  . Drug use: No  . Sexual activity: Not Currently   Other Topics Concern  . Not on file   Social History Narrative   Diet? Regular      Do you drink/eat things with caffeine? Soda, sometimes      Marital status?      widow                              What year were you married? 76 years      Do you live in a house, apartment, assisted living, condo, trailer, etc.? house      Is it one or more stories? n/a      How many persons live in your home? 1      Do you have any pets in your  home? (please list) no      Current or past profession: work in Associate Professor      Do you exercise?          no                            Type & how often?      Do you have a living will?  yes      Do you have a DNR form?     no                             If not, do you want to discuss one?      Do you have signed POA/HPOA for forms? yes  Family History  Problem Relation Age of Onset  . Stroke Mother   . Hypertension Sister   . Other Son        Homicide, stabbed  . Stroke Son   . Hypertension Son   . Diabetes Son   . Hypertension Son     VITAL SIGNS BP 136/72   Pulse 80   Temp 97.7 F (36.5 C)   Resp 18   Ht 5' 11"  (1.803 m)   Wt 147 lb 9.6 oz (67 kg)   SpO2 99%   BMI 20.59 kg/m   Patient's Medications  New Prescriptions   No medications on file  Previous Medications   AMIODARONE (PACERONE) 200 MG TABLET    Take 1 tablet (200 mg total) by mouth daily.   AMLODIPINE (NORVASC) 10 MG TABLET    Take 1 tablet (10 mg total) by mouth daily.   ASPIRIN EC 81 MG TABLET    Take 81 mg by mouth daily.   FERROUS SULFATE 325 (65 FE) MG TABLET    Take 325 mg by mouth daily with breakfast.   HYDRALAZINE (APRESOLINE) 100 MG TABLET    Take 1 tablet (100 mg total) by mouth 3 (three) times daily.   LORAZEPAM (ATIVAN) 0.5 MG TABLET    Take 1 tablet (0.5 mg total) by mouth every 8 (eight) hours as needed (agitation).   MELATONIN 3 MG TABS    Take 3 mg by mouth at bedtime.   MEMANTINE (NAMENDA) 5 MG TABLET    Take 5 mg by mouth daily.   METOPROLOL SUCCINATE (TOPROL-XL) 25 MG 24 HR TABLET    Take 1 tablet (25 mg total) by mouth daily.   OMEPRAZOLE (PRILOSEC) 20 MG CAPSULE    Take 20 mg by mouth daily.   SODIUM BICARBONATE 650 MG TABLET    Take 1 tablet (650 mg total) by mouth 2 (two) times daily.   TAMSULOSIN (FLOMAX) 0.4 MG CAPS CAPSULE    TAKE 1 CAPSULE(0.4 MG) BY MOUTH DAILY AFTER SUPPER   TRAMADOL (ULTRAM) 50 MG TABLET    Take 1 tablet (50 mg total) by mouth every 6  (six) hours as needed for moderate pain.  Modified Medications   No medications on file  Discontinued Medications   No medications on file     SIGNIFICANT DIAGNOSTIC EXAMS    LABS REVIEWED:   10-20-16:  wbc 3.6; hgb 7.7; hct 23.8; mcv 98.3; plt 220; glucose 91; bun 33; creat 2.49; k+ 5.5; na++ 138; ca 8.1; alk phos 129; albumin 2.9  12-07-16: wbc 2.6; hgb 9.2; hct 27.3; mcv 100.8; plt 142; glucose 87; bun 35.9; creat 2.26; k+ 4.5; na++ 142; ca 8.3; liver normal albumin 3.7 Vit 12: 203     Review of Systems  Unable to perform ROS: Dementia   Physical Exam  Constitutional: No distress.  Thin   Eyes: Conjunctivae are normal.  Neck: Neck supple. No JVD present. No thyromegaly present.  Cardiovascular: Normal rate and intact distal pulses.   Murmur heard. Heart rate irregular   Respiratory: Effort normal and breath sounds normal. No respiratory distress. He has no wheezes.  GI: Soft. Bowel sounds are normal. He exhibits no distension. There is no tenderness.  Musculoskeletal: He exhibits edema.  Able to move all extremities  trace bilateral lower extremity edema   Lymphadenopathy:    He has no cervical adenopathy.  Neurological: He is alert.  Skin: Skin is warm and dry. He is not diaphoretic.  Psychiatric: He has  a normal mood and affect.    ASSESSMENT/ PLAN:   Patient is being discharged with the following home health services:  Pt/ot to evaluate and treat as indicated for gait balance strength adl training   Patient is being discharged with the following durable medical equipment:  None required  Patient has been advised to f/u with their PCP in 1-2 weeks to bring them up to date on their rehab stay.  Social services at facility was responsible for arranging this appointment.  Pt was provided with a 30 day supply of prescriptions for medications and refills must be obtained from their PCP.  For controlled substances, a more limited supply may be provided adequate until PCP  appointment only. #30 ultram 50 mg tabs; #30 ativan 0.5 mg tabs   Vit B 12 level 203; will begin vit B 12 1000 mcg daily   Time spent with patient 40   minutes >50% time spent counseling; reviewing medical record; tests; labs; and developing future plan of care   Ok Edwards NP [facility]  Contact [phone] Monday through Friday 8am- 5pm  After hours call [phone]

## 2016-12-28 DIAGNOSIS — T84021D Dislocation of internal left hip prosthesis, subsequent encounter: Secondary | ICD-10-CM | POA: Diagnosis not present

## 2016-12-28 DIAGNOSIS — F0280 Dementia in other diseases classified elsewhere without behavioral disturbance: Secondary | ICD-10-CM | POA: Diagnosis not present

## 2016-12-28 DIAGNOSIS — N184 Chronic kidney disease, stage 4 (severe): Secondary | ICD-10-CM | POA: Diagnosis not present

## 2016-12-28 DIAGNOSIS — I129 Hypertensive chronic kidney disease with stage 1 through stage 4 chronic kidney disease, or unspecified chronic kidney disease: Secondary | ICD-10-CM | POA: Diagnosis not present

## 2016-12-28 DIAGNOSIS — I480 Paroxysmal atrial fibrillation: Secondary | ICD-10-CM | POA: Diagnosis not present

## 2016-12-28 DIAGNOSIS — N400 Enlarged prostate without lower urinary tract symptoms: Secondary | ICD-10-CM | POA: Diagnosis not present

## 2016-12-28 DIAGNOSIS — G309 Alzheimer's disease, unspecified: Secondary | ICD-10-CM | POA: Diagnosis not present

## 2017-01-20 ENCOUNTER — Ambulatory Visit: Payer: Medicare Other | Admitting: Nurse Practitioner

## 2017-04-24 ENCOUNTER — Other Ambulatory Visit: Payer: Self-pay

## 2017-04-24 MED ORDER — LORAZEPAM 0.5 MG PO TABS: 0.5000 mg | ORAL_TABLET | Freq: Three times a day (TID) | ORAL | 0 refills | Status: DC | PRN

## 2017-04-24 NOTE — Telephone Encounter (Signed)
Crystal with [facility] called to request rx for Lorazepam  Per Janett Billow ok to give # 10, patient needs OV for follow-up  Crystal aware and states she will follow-up with patient's family and have them contact us to schedule an appointment.  RX printed and faxed [phone]

## 2017-04-28 NOTE — Telephone Encounter (Signed)
I tried to call patient to schedule appointment. Main contact number was not a working number, I called alternate number, recording stated that number is not receiving calls at this time

## 2017-05-03 NOTE — Telephone Encounter (Signed)
Called numbers on file to contact patient to inform him/family appointment is due. Unable to reach patient or leave a message.    Called patient's son and scheduled appointment for next Tuesday

## 2017-05-09 ENCOUNTER — Encounter: Payer: Self-pay | Admitting: Nurse Practitioner

## 2017-05-09 ENCOUNTER — Ambulatory Visit (INDEPENDENT_AMBULATORY_CARE_PROVIDER_SITE_OTHER): Payer: Medicare Other | Admitting: Nurse Practitioner

## 2017-05-09 VITALS — BP 130/78 | HR 57 | Temp 97.9°F | Resp 20 | Ht 71.0 in | Wt 157.0 lb

## 2017-05-09 DIAGNOSIS — I480 Paroxysmal atrial fibrillation: Secondary | ICD-10-CM

## 2017-05-09 DIAGNOSIS — D519 Vitamin B12 deficiency anemia, unspecified: Secondary | ICD-10-CM

## 2017-05-09 DIAGNOSIS — N184 Chronic kidney disease, stage 4 (severe): Secondary | ICD-10-CM

## 2017-05-09 DIAGNOSIS — I10 Essential (primary) hypertension: Secondary | ICD-10-CM | POA: Diagnosis not present

## 2017-05-09 DIAGNOSIS — F0281 Dementia in other diseases classified elsewhere with behavioral disturbance: Secondary | ICD-10-CM | POA: Diagnosis not present

## 2017-05-09 DIAGNOSIS — K219 Gastro-esophageal reflux disease without esophagitis: Secondary | ICD-10-CM | POA: Diagnosis not present

## 2017-05-09 DIAGNOSIS — G301 Alzheimer's disease with late onset: Secondary | ICD-10-CM

## 2017-05-09 NOTE — Progress Notes (Signed)
Careteam: Patient Care Team: [name], NP as PCP - General (Geriatric Medicine) Adrian Prows, MD as Consulting Physician (Cardiology)  Advanced Directive information    No Known Allergies  Chief Complaint  Patient presents with  . Medical Management of Chronic Issues    Routine  Visit, Patient c/o has black heads on his back and complains of his heart fluttering     HPI: Patient is a 83 y.o. male seen in the office today for routine follow up. Pt has not been seen since he was at [facility].  Pt lives at [place].  He has been doing good. His daughter comes by and checks on him pretty often.  Reports he is feeling well. Over the summer had a fall with hip prosthesis/left hip fracture, went to SNF after this, that was when it was determined he could no longer be at home and needed 24/7 supervision. No recent falls.   Paroxysmal a fib- using amiodarone and toprol, only taking ASA due to falls. Had been following with Dr Einar Gip, Cardiologist. Reports heart has not fluttered in a while. No current palpitations.   htn- received medication before living facility   BPH- using flomax   CKD - stage 4.. He takes NaHCO3 tabs for metabolic acidosis. Has been followed by nephrologist  Dr Florene Glen--  Son who is with him today unsure when his last appt was.   Dementia- remains in AL with 24/7 care, overall has been stable. On namenda.  Son states he does not feel like he has increase in agitation but grandson reports he frequently talks about going home and has a new roommate.  Reports he is eating well. Weight is up from last visit in epic.   Has trouble getting him here due to families schedule- they are very stretched out.   Review of Systems:  Review of Systems  Unable to perform ROS: Dementia    Past Medical History:  Diagnosis Date  . BPH (benign prostatic hyperplasia)   . Chronic kidney disease    ckd stage 3  . Dementia   . GERD (gastroesophageal reflux disease)    . Hemorrhoids   . History of angina   . Hyperlipemia   . Hypertension   . Irregular heart rhythm   . Paroxysmal A-fib ([place]) 04/02/2015   Past Surgical History:  Procedure Laterality Date  . BACK SURGERY    . CERVICAL SPINE SURGERY    . SPINE SURGERY     Social History:   reports that he has quit smoking. His smoking use included cigarettes. He has a 5.00 pack-year smoking history. he has never used smokeless tobacco. He reports that he does not drink alcohol or use drugs.  Family History  Problem Relation Age of Onset  . Stroke Mother   . Hypertension Sister   . Other Son        Homicide, stabbed  . Stroke Son   . Hypertension Son   . Diabetes Son   . Hypertension Son     Medications:   Medication List        Accurate as of 05/09/17  1:52 PM. Always use your most recent med list.          amiodarone 200 MG tablet Commonly known as:  PACERONE Take 1 tablet (200 mg total) by mouth daily.   amLODipine 10 MG tablet Commonly known as:  NORVASC Take 1 tablet (10 mg total) by mouth daily.   aspirin EC 81  MG tablet   cyanocobalamin 1000 MCG tablet   ferrous sulfate 325 (65 FE) MG tablet   hydrALAZINE 100 MG tablet Commonly known as:  APRESOLINE Take 1 tablet (100 mg total) by mouth 3 (three) times daily.   LORazepam 0.5 MG tablet Commonly known as:  ATIVAN Take 1 tablet (0.5 mg total) by mouth every 8 (eight) hours as needed (agitation).   Melatonin 3 MG Tabs   memantine 5 MG tablet Commonly known as:  NAMENDA   metoprolol succinate 25 MG 24 hr tablet Commonly known as:  TOPROL-XL Take 1 tablet (25 mg total) by mouth daily.   omeprazole 20 MG capsule Commonly known as:  PRILOSEC   sodium bicarbonate 650 MG tablet Take 1 tablet (650 mg total) by mouth 2 (two) times daily.   tamsulosin 0.4 MG Caps capsule Commonly known as:  FLOMAX TAKE 1 CAPSULE(0.4 MG) BY MOUTH DAILY AFTER SUPPER   traMADol 50 MG tablet Commonly known as:  ULTRAM Take 1  tablet (50 mg total) by mouth every 6 (six) hours as needed for moderate pain.        Physical Exam:  Vitals:   05/09/17 1331 05/09/17 1346  BP: (!) 160/60 130/78  Pulse: (!) 57   Resp: 20   Temp: 97.9 F (36.6 C)   TempSrc: Oral   SpO2: 97%   Weight: 157 lb (71.2 kg)   Height: 5' 11"  (1.803 m)    Body mass index is 21.9 kg/m.  Physical Exam  Constitutional: He appears well-developed.  Frail appearing male in Wheelchair in NAD  HENT:  Mouth/Throat: Oropharynx is clear and moist.  Eyes: Pupils are equal, round, and reactive to light. No scleral icterus.  Neck: Neck supple. Carotid bruit is not present.  Cardiovascular: Normal rate and intact distal pulses. An irregularly irregular rhythm present. Exam reveals no gallop and no friction rub.  Murmur (1/6 SEM) heard. Pulmonary/Chest: Effort normal and breath sounds normal.  Abdominal: Soft. Normal appearance and bowel sounds are normal. He exhibits no abdominal bruit and no pulsatile midline mass. There is no hepatomegaly. There is no rigidity.  Musculoskeletal: He exhibits no edema.  Neurological: He is alert.  Skin: Skin is warm and dry. No rash noted.  Psychiatric: He has a normal mood and affect. Cognition and memory are impaired. He exhibits abnormal recent memory.    Labs reviewed: Basic Metabolic Panel: Recent Labs    09/22/16 0527 10/20/16 1500 11/03/16 1122 12/07/16  NA 141 138 142 142  K 5.0 5.5* 4.4 4.5  CL 112* 107 113*  --   CO2 23 21 18*  --   GLUCOSE 82 91 101*  --   BUN 38* 33* 32* 36*  CREATININE 2.39* 2.49* 2.65* 2.3*  CALCIUM 7.9* 8.1* 8.0*  --   MG 1.9  --   --   --   TSH  --   --   --  2.91   Liver Function Tests: Recent Labs    06/06/16 1108 09/20/16 1958 10/20/16 1500 12/07/16  AST 17 29 15  13*  ALT 8* 18 8* 7*  ALKPHOS 52 107 129* 103  BILITOT 0.4 1.0 0.4  --   PROT 6.3 7.2 5.5*  --   ALBUMIN 3.8 3.3* 2.9*  --    No results for input(s): LIPASE, AMYLASE in the last 8760  hours. No results for input(s): AMMONIA in the last 8760 hours. CBC: Recent Labs    09/20/16 1958  09/21/16 0500  09/22/16 0527 10/20/16  1500 12/07/16  WBC 7.7   < > 6.3   < > 5.3 3.6* 2.6  NEUTROABS 6.6  --   --   --   --  2,448 1  HGB 8.3*   < > 6.8*  --  7.1* 7.7* 9.2*  HCT 26.4*   < > 21.3*  --  21.4* 23.8* 27*  MCV 97.8  --  98.2  --  95.5 98.3  --   PLT 321   < > 317  --  293 220 142*   < > = values in this interval not displayed.   Lipid Panel: No results for input(s): CHOL, HDL, LDLCALC, TRIG, CHOLHDL, LDLDIRECT in the last 8760 hours. TSH: Recent Labs    12/07/16  TSH 2.91   A1C: No results found for: HGBA1C   Assessment/Plan 1. Paroxysmal A-fib (HCC) Rate controlled, conts on metoprolol and amiodarone, only on ASA due to frequent falls.  2. Essential hypertension -elevated when he got here but better on recheck, will cont current regimen. - CMP with eGFR  3. Late onset Alzheimer's disease with behavioral disturbance -stable, doing well in AL, conts on namenda. Occasional behaviors. Uses ativan as needed agitation and anxiety.   4. Gastroesophageal reflux disease without esophagitis Stable without complaints on omeprazole  5. CKD (chronic kidney disease) stage 4, GFR 15-29 ml/min (HCC) -following with nephrologist - CMP with eGFR  6. Anemia due to vitamin B12 deficiency, unspecified B12 deficiency type -conts on b12 supplement, will follow up lab - Vitamin B12 - CBC with Differential/Platelets   Next appt: family has a hard time getting pt to appt, he is seeing nephrologist and cardiologist, will see him back in 6 months. Sooner if needed  [employer]. Harle Battiest  [facility] [phone] 8 am - 5 pm) [phone] (after hours)

## 2017-05-10 LAB — CBC WITH DIFFERENTIAL/PLATELET
Basophils Absolute: 19 cells/uL (ref 0–200)
Basophils Relative: 0.5 %
Eosinophils Absolute: 72 cells/uL (ref 15–500)
Eosinophils Relative: 1.9 %
HCT: 34 % — ABNORMAL LOW (ref 38.5–50.0)
Hemoglobin: 11.3 g/dL — ABNORMAL LOW (ref 13.2–17.1)
Lymphs Abs: 893 cells/uL (ref 850–3900)
MCH: 32.2 pg (ref 27.0–33.0)
MCHC: 33.2 g/dL (ref 32.0–36.0)
MCV: 96.9 fL (ref 80.0–100.0)
MPV: 10.7 fL (ref 7.5–12.5)
Monocytes Relative: 10.1 %
Neutro Abs: 2432 cells/uL (ref 1500–7800)
Neutrophils Relative %: 64 %
Platelets: 194 10*3/uL (ref 140–400)
RBC: 3.51 10*6/uL — ABNORMAL LOW (ref 4.20–5.80)
RDW: 12.3 % (ref 11.0–15.0)
Total Lymphocyte: 23.5 %
WBC mixed population: 384 cells/uL (ref 200–950)
WBC: 3.8 10*3/uL (ref 3.8–10.8)

## 2017-05-10 LAB — COMPLETE METABOLIC PANEL WITH GFR
AG Ratio: 1.4 (calc) (ref 1.0–2.5)
ALT: 8 U/L — ABNORMAL LOW (ref 9–46)
AST: 17 U/L (ref 10–35)
Albumin: 3.8 g/dL (ref 3.6–5.1)
Alkaline phosphatase (APISO): 83 U/L (ref 40–115)
BUN/Creatinine Ratio: 14 (calc) (ref 6–22)
BUN: 34 mg/dL — ABNORMAL HIGH (ref 7–25)
CO2: 25 mmol/L (ref 20–32)
Calcium: 8.8 mg/dL (ref 8.6–10.3)
Chloride: 106 mmol/L (ref 98–110)
Creat: 2.48 mg/dL — ABNORMAL HIGH (ref 0.70–1.11)
GFR, Est African American: 25 mL/min/{1.73_m2} — ABNORMAL LOW (ref >=60)
GFR, Est Non African American: 22 mL/min/{1.73_m2} — ABNORMAL LOW (ref >=60)
Globulin: 2.7 g/dL (calc) (ref 1.9–3.7)
Glucose, Bld: 103 mg/dL (ref 65–139)
Potassium: 5.2 mmol/L (ref 3.5–5.3)
Sodium: 140 mmol/L (ref 135–146)
Total Bilirubin: 0.4 mg/dL (ref 0.2–1.2)
Total Protein: 6.5 g/dL (ref 6.1–8.1)

## 2017-05-10 LAB — VITAMIN B12: Vitamin B-12: >2000 pg/mL — ABNORMAL HIGH (ref 200–1100)

## 2017-08-02 ENCOUNTER — Telehealth: Payer: Self-pay

## 2017-08-02 NOTE — Telephone Encounter (Signed)
Called patient to try to schedule AWV in the office. Both numbers weren't connecting and was unable to leave a voicemail to call back.

## 2017-09-12 ENCOUNTER — Ambulatory Visit (INDEPENDENT_AMBULATORY_CARE_PROVIDER_SITE_OTHER): Payer: Medicare Other | Admitting: Nurse Practitioner

## 2017-09-12 ENCOUNTER — Encounter: Payer: Self-pay | Admitting: Nurse Practitioner

## 2017-09-12 VITALS — BP 128/72 | HR 60 | Temp 98.1°F | Ht 71.0 in | Wt 159.0 lb

## 2017-09-12 DIAGNOSIS — L723 Sebaceous cyst: Secondary | ICD-10-CM

## 2017-09-12 DIAGNOSIS — G301 Alzheimer's disease with late onset: Secondary | ICD-10-CM

## 2017-09-12 DIAGNOSIS — I872 Venous insufficiency (chronic) (peripheral): Secondary | ICD-10-CM

## 2017-09-12 DIAGNOSIS — Z7289 Other problems related to lifestyle: Secondary | ICD-10-CM | POA: Diagnosis not present

## 2017-09-12 DIAGNOSIS — R609 Edema, unspecified: Secondary | ICD-10-CM

## 2017-09-12 DIAGNOSIS — F02818 Dementia in other diseases classified elsewhere, unspecified severity, with other behavioral disturbance: Secondary | ICD-10-CM

## 2017-09-12 DIAGNOSIS — F0281 Dementia in other diseases classified elsewhere with behavioral disturbance: Secondary | ICD-10-CM

## 2017-09-12 DIAGNOSIS — R0781 Pleurodynia: Secondary | ICD-10-CM

## 2017-09-12 DIAGNOSIS — L089 Local infection of the skin and subcutaneous tissue, unspecified: Secondary | ICD-10-CM | POA: Diagnosis not present

## 2017-09-12 DIAGNOSIS — R600 Localized edema: Secondary | ICD-10-CM

## 2017-09-12 MED ORDER — DOXYCYCLINE HYCLATE 100 MG PO TABS: 100.0000 mg | ORAL_TABLET | Freq: Two times a day (BID) | ORAL | 0 refills | Status: DC
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
## 2017-09-12 NOTE — Patient Instructions (Addendum)
Infected sebaceous cyst 1) to start doxycyline 100 mg by mouth twice daily for 1 week- give with food to avoid GI upset 2) to use warm compress to area and massage three times daily Notify if area become warm, red, does not heal or fever occurs   Swelling in lower legs 1) compression hose- apply daily in the am and remove at bedtime 2) elevate legs above the level of heart as much as tolerates and elevate legs when sitting 3) low sodium diet

## 2017-10-05 ENCOUNTER — Telehealth: Payer: Self-pay | Admitting: Nurse Practitioner

## 2017-10-05 NOTE — Telephone Encounter (Signed)
Left msg asking pt to confirm AWV-S with nurse on 10/24/17 before his appt w/ Janett Billow. VDM (DD)

## 2017-10-12 NOTE — Telephone Encounter (Signed)
Left another msg asking if pt can come in earlier on 10/24/17. VDM (DD)

## 2017-10-24 ENCOUNTER — Ambulatory Visit: Payer: Medicare Other

## 2017-10-24 ENCOUNTER — Ambulatory Visit (INDEPENDENT_AMBULATORY_CARE_PROVIDER_SITE_OTHER): Payer: Medicare Other | Admitting: Nurse Practitioner

## 2017-10-24 ENCOUNTER — Encounter: Payer: Self-pay | Admitting: Nurse Practitioner

## 2017-10-24 VITALS — BP 158/88 | HR 68 | Temp 98.0°F | Ht 71.0 in | Wt 159.0 lb

## 2017-10-24 DIAGNOSIS — R609 Edema, unspecified: Secondary | ICD-10-CM | POA: Diagnosis not present

## 2017-10-24 DIAGNOSIS — N184 Chronic kidney disease, stage 4 (severe): Secondary | ICD-10-CM | POA: Diagnosis not present

## 2017-10-24 DIAGNOSIS — I480 Paroxysmal atrial fibrillation: Secondary | ICD-10-CM | POA: Diagnosis not present

## 2017-10-24 DIAGNOSIS — D519 Vitamin B12 deficiency anemia, unspecified: Secondary | ICD-10-CM

## 2017-10-24 DIAGNOSIS — F0281 Dementia in other diseases classified elsewhere with behavioral disturbance: Secondary | ICD-10-CM | POA: Diagnosis not present

## 2017-10-24 DIAGNOSIS — N400 Enlarged prostate without lower urinary tract symptoms: Secondary | ICD-10-CM | POA: Diagnosis not present

## 2017-10-24 DIAGNOSIS — Z7189 Other specified counseling: Secondary | ICD-10-CM

## 2017-10-24 DIAGNOSIS — K219 Gastro-esophageal reflux disease without esophagitis: Secondary | ICD-10-CM

## 2017-10-24 DIAGNOSIS — G301 Alzheimer's disease with late onset: Secondary | ICD-10-CM

## 2017-10-24 DIAGNOSIS — I10 Essential (primary) hypertension: Secondary | ICD-10-CM | POA: Diagnosis not present

## 2017-10-24 DIAGNOSIS — R600 Localized edema: Secondary | ICD-10-CM

## 2017-10-24 DIAGNOSIS — I872 Venous insufficiency (chronic) (peripheral): Secondary | ICD-10-CM | POA: Diagnosis not present

## 2017-10-24 DIAGNOSIS — G4701 Insomnia due to medical condition: Secondary | ICD-10-CM | POA: Diagnosis not present

## 2017-10-24 LAB — COMPLETE METABOLIC PANEL WITH GFR
AG Ratio: 1.6 (calc) (ref 1.0–2.5)
ALT: 8 U/L — ABNORMAL LOW (ref 9–46)
AST: 16 U/L (ref 10–35)
Albumin: 4.1 g/dL (ref 3.6–5.1)
Alkaline phosphatase (APISO): 62 U/L (ref 40–115)
BUN/Creatinine Ratio: 15 (calc) (ref 6–22)
BUN: 39 mg/dL — ABNORMAL HIGH (ref 7–25)
CO2: 23 mmol/L (ref 20–32)
Calcium: 8.7 mg/dL (ref 8.6–10.3)
Chloride: 111 mmol/L — ABNORMAL HIGH (ref 98–110)
Creat: 2.66 mg/dL — ABNORMAL HIGH (ref 0.70–1.11)
GFR, Est African American: 23 mL/min/{1.73_m2} — ABNORMAL LOW (ref >=60)
GFR, Est Non African American: 20 mL/min/{1.73_m2} — ABNORMAL LOW (ref >=60)
Globulin: 2.6 g/dL (calc) (ref 1.9–3.7)
Glucose, Bld: 107 mg/dL (ref 65–139)
Potassium: 4.5 mmol/L (ref 3.5–5.3)
Sodium: 141 mmol/L (ref 135–146)
Total Bilirubin: 0.4 mg/dL (ref 0.2–1.2)
Total Protein: 6.7 g/dL (ref 6.1–8.1)

## 2017-10-24 LAB — CBC WITH DIFFERENTIAL/PLATELET
Basophils Absolute: 21 cells/uL (ref 0–200)
Basophils Relative: 0.5 %
Eosinophils Absolute: 80 cells/uL (ref 15–500)
Eosinophils Relative: 1.9 %
HCT: 31.4 % — ABNORMAL LOW (ref 38.5–50.0)
Hemoglobin: 10.7 g/dL — ABNORMAL LOW (ref 13.2–17.1)
Lymphs Abs: 1025 cells/uL (ref 850–3900)
MCH: 31.9 pg (ref 27.0–33.0)
MCHC: 34.1 g/dL (ref 32.0–36.0)
MCV: 93.7 fL (ref 80.0–100.0)
MPV: 10.5 fL (ref 7.5–12.5)
Monocytes Relative: 11 %
Neutro Abs: 2612 cells/uL (ref 1500–7800)
Neutrophils Relative %: 62.2 %
Platelets: 194 10*3/uL (ref 140–400)
RBC: 3.35 10*6/uL — ABNORMAL LOW (ref 4.20–5.80)
RDW: 12.9 % (ref 11.0–15.0)
Total Lymphocyte: 24.4 %
WBC mixed population: 462 cells/uL (ref 200–950)
WBC: 4.2 10*3/uL (ref 3.8–10.8)

## 2017-10-24 NOTE — Progress Notes (Signed)
Careteam: Patient Care Team: [name], NP as PCP - General (Geriatric Medicine) Adrian Prows, MD as Consulting Physician (Cardiology)  Advanced Directive information Does Patient Have a Medical Advance Directive?: Yes, Type of Advance Directive: Out of facility DNR (pink MOST or yellow form), Pre-existing out of facility DNR order (yellow form or pink MOST form): Pink MOST form placed in chart (order not valid for inpatient use)  No Known Allergies  Chief Complaint  Patient presents with  . Medical Management of Chronic Issues    Pt is being seen for a 6 month routine visit.   . ACP    Pt has MOST and POA     HPI: Patient is a 84 y.o. male seen in the office today for routine follow up. He is here with daughter today Pt with hx of dementia, a fib, htn, BPH, CKD Pt long term resident at [facility].  pts MOST form updated and DNR placed on file, discussed this with daughter.   HTN- reports he is nervous due to being at the doctors office. Continues on hydralazine 100 mg TID and norvasc 10 mg daily, daughter unsure if he has gotten his medication.   BPH- no problems with urination, continues on flomax for urination.   Dementia- occasional agitation, not a major issues. Reports this has overall been stable without significant decline.   Ongoing swelling to lower legs, not wearing compression hose. Daughter plans to talk to them about this.   Occasional GERD, on prilosec  Review of Systems:  Review of Systems  Unable to perform ROS: Dementia    Past Medical History:  Diagnosis Date  . BPH (benign prostatic hyperplasia)   . Chronic kidney disease    ckd stage 3  . Dementia   . GERD (gastroesophageal reflux disease)   . Hemorrhoids   . History of angina   . Hyperlipemia   . Hypertension   . Irregular heart rhythm   . Paroxysmal A-fib ([place]) 04/02/2015   Past Surgical History:  Procedure Laterality Date  . BACK SURGERY    . CERVICAL SPINE SURGERY      . SPINE SURGERY     Social History:   reports that he has quit smoking. His smoking use included cigarettes. He has a 5.00 pack-year smoking history. He has never used smokeless tobacco. He reports that he does not drink alcohol or use drugs.  Family History  Problem Relation Age of Onset  . Stroke Mother   . Hypertension Sister   . Other Son        Homicide, stabbed  . Stroke Son   . Hypertension Son   . Diabetes Son   . Hypertension Son     Medications: Patient's Medications  New Prescriptions   No medications on file  Previous Medications   AMIODARONE (PACERONE) 200 MG TABLET    Take 1 tablet (200 mg total) by mouth daily.   AMLODIPINE (NORVASC) 10 MG TABLET    Take 1 tablet (10 mg total) by mouth daily.   ASPIRIN EC 81 MG TABLET    Take 81 mg by mouth daily.   CYANOCOBALAMIN 1000 MCG TABLET    Take 1,000 mcg by mouth daily.   FERROUS SULFATE 325 (65 FE) MG TABLET    Take 325 mg by mouth daily with breakfast.   HYDRALAZINE (APRESOLINE) 100 MG TABLET    Take 1 tablet (100 mg total) by mouth 3 (three) times daily.   LORAZEPAM (ATIVAN) 0.5 MG  TABLET    Take 1 tablet (0.5 mg total) by mouth every 8 (eight) hours as needed (agitation).   MELATONIN 3 MG TABS    Take 3 mg by mouth at bedtime.   MEMANTINE (NAMENDA) 5 MG TABLET    Take 5 mg by mouth daily.   METOPROLOL SUCCINATE (TOPROL-XL) 25 MG 24 HR TABLET    Take 1 tablet (25 mg total) by mouth daily.   OMEPRAZOLE (PRILOSEC) 20 MG CAPSULE    Take 20 mg by mouth daily.   SODIUM BICARBONATE 650 MG TABLET    Take 1 tablet (650 mg total) by mouth 2 (two) times daily.   TAMSULOSIN (FLOMAX) 0.4 MG CAPS CAPSULE    TAKE 1 CAPSULE(0.4 MG) BY MOUTH DAILY AFTER SUPPER   TRAMADOL (ULTRAM) 50 MG TABLET    Take 1 tablet (50 mg total) by mouth every 6 (six) hours as needed for moderate pain.  Modified Medications   No medications on file  Discontinued Medications   DOXYCYCLINE (VIBRA-TABS) 100 MG TABLET    Take 1 tablet (100 mg total) by  mouth 2 (two) times daily.     Physical Exam:  Vitals:   10/24/17 1524  BP: (!) 158/88  Pulse: 68  Temp: 98 F (36.7 C)  TempSrc: Oral  SpO2: 98%  Weight: 159 lb (72.1 kg)  Height: 5' 11"  (1.803 m)   Body mass index is 22.18 kg/m.  Physical Exam  Constitutional: He appears well-developed.  Frail appearing male in Wheelchair in NAD  HENT:  Mouth/Throat: Oropharynx is clear and moist.  Eyes: Pupils are equal, round, and reactive to light. No scleral icterus.  Neck: Neck supple. Carotid bruit is not present.  Cardiovascular: Normal rate and intact distal pulses. An irregularly irregular rhythm present. Exam reveals no gallop and no friction rub.  Murmur (1/6 SEM) heard. Pulmonary/Chest: Effort normal and breath sounds normal. He exhibits no tenderness.  Abdominal: Normal appearance. He exhibits no abdominal bruit and no pulsatile midline mass. There is no hepatomegaly. There is no rigidity.  Musculoskeletal: He exhibits edema (1+ pitting).  Neurological: He is alert.  Skin: Skin is warm and dry. No rash noted.  Psychiatric: He has a normal mood and affect. Cognition and memory are impaired. He exhibits abnormal recent memory.   Labs reviewed: Basic Metabolic Panel: Recent Labs    11/03/16 1122 12/07/16 05/09/17 1425  NA 142 142 140  K 4.4 4.5 5.2  CL 113*  --  106  CO2 18*  --  25  GLUCOSE 101*  --  103  BUN 32* 36* 34*  CREATININE 2.65* 2.3* 2.48*  CALCIUM 8.0*  --  8.8  TSH  --  2.91  --    Liver Function Tests: Recent Labs    12/07/16 05/09/17 1425  AST 13* 17  ALT 7* 8*  ALKPHOS 103  --   BILITOT  --  0.4  PROT  --  6.5   No results for input(s): LIPASE, AMYLASE in the last 8760 hours. No results for input(s): AMMONIA in the last 8760 hours. CBC: Recent Labs    12/07/16 05/09/17 1425  WBC 2.6 3.8  NEUTROABS 1 2,432  HGB 9.2* 11.3*  HCT 27* 34.0*  MCV  --  96.9  PLT 142* 194   Lipid Panel: No results for input(s): CHOL, HDL, LDLCALC, TRIG,  CHOLHDL, LDLDIRECT in the last 8760 hours. TSH: Recent Labs    12/07/16  TSH 2.91   A1C: No results found for: HGBA1C  Assessment/Plan 1. Late onset Alzheimer's disease with behavioral disturbance -unchanged, stable without significant decline in cognitive or functional status per daughter.   2. Edema of both lower legs due to peripheral venous insufficiency Unchanged, encouraged elevation, low sodium diet and compression hose.   3. Paroxysmal A-fib (HCC) -rate controlled, not on anticoagulation due to high risk of falls. Continues on amiodarone 200 mg daily   4. Essential hypertension Elevated today, will have facility check blood pressure weekly and to notify if sbp remains over 140. Low sodium diet. Continues on toprol XL, hydralazine, norvasc   5. Anemia due to vitamin B12 deficiency, unspecified B12 deficiency type Continues on b12 supplement, will follow up - CBC with Differential/Platelets  6. CKD (chronic kidney disease) stage 4, GFR 15-29 ml/min (HCC) - CMP with eGFR([facility]) -continues on sodium bicarbonate 0.4 daily  7. Gastroesophageal reflux disease without esophagitis Stable on omeprazole.   8. Insomnia due to medical condition -continues on melatonin 3 mg daily   9. Benign prostatic hyperplasia, unspecified whether lower urinary tract symptoms present Stable without noted changes, continues on flomax   10. Advanced care planning/counseling discussion -updated MOST form and placed on file.  - DNR (Do Not Resuscitate)  Next appt: 6 months with Dr Thayer Headings K. [place], [facility] [phone]

## 2017-10-24 NOTE — ACP (Advance Care Planning) (Signed)
Updated MOST form with daughter and DNR signed and placed on chart.

## 2017-10-27 ENCOUNTER — Ambulatory Visit: Payer: Medicare Other

## 2017-10-27 ENCOUNTER — Telehealth: Payer: Self-pay | Admitting: *Deleted

## 2017-10-27 NOTE — Telephone Encounter (Signed)
Crystal with [facility] called and left message on Clinical Intake and stated that patient was seen and Orders were written but were not signed by provider. Wanted a call back regarding this.   I tried Manufacturing engineer and receptionist stated that she was not answering line. Voicemail full. Receptionist will leave message that I called.

## 2017-10-27 NOTE — Telephone Encounter (Signed)
Okay they can fax orders over and I will sign

## 2017-10-27 NOTE — Telephone Encounter (Signed)
Spoke with Teacher, music at [facility]. Fax machine is broken so she took verbal order and will fax the written order for Wayne Fuller to sign. She will fax when fax machine is working.

## 2017-10-30 ENCOUNTER — Ambulatory Visit: Payer: Medicare Other

## 2017-12-21 ENCOUNTER — Encounter: Payer: Self-pay | Admitting: Nurse Practitioner

## 2018-01-08 ENCOUNTER — Other Ambulatory Visit: Payer: Self-pay

## 2018-01-08 MED ORDER — TRAMADOL HCL 50 MG PO TABS: 50.0000 mg | ORAL_TABLET | Freq: Four times a day (QID) | ORAL | 0 refills | Status: DC | PRN

## 2018-01-08 MED ORDER — LORAZEPAM 0.5 MG PO TABS: 0.5000 mg | ORAL_TABLET | Freq: Three times a day (TID) | ORAL | 0 refills | Status: DC | PRN

## 2018-01-08 NOTE — Telephone Encounter (Signed)
A handwritten fax was received from [facility] requesting refills on the following medications:   Lorazepam 1 mg BID (this is not on current medication list)  Lorazepam 0.5 mg PRN (this is different than listed on current medication list)  Tramadol 50 mg PRN (this is different than listed on current medication list)   Rx need to be sent to [facility] (fax [phone])   Please advise if it is ok to refill medications as requested by facility.

## 2018-01-09 ENCOUNTER — Other Ambulatory Visit: Payer: Self-pay

## 2018-01-09 MED ORDER — TRAMADOL HCL 50 MG PO TABS: 50.0000 mg | ORAL_TABLET | Freq: Four times a day (QID) | ORAL | 0 refills | Status: DC | PRN

## 2018-01-09 MED ORDER — LORAZEPAM 0.5 MG PO TABS: 0.5000 mg | ORAL_TABLET | Freq: Three times a day (TID) | ORAL | 0 refills | Status: DC | PRN

## 2018-01-09 NOTE — Telephone Encounter (Signed)
A hand written letter was received via fax from [facility] requesting a refill on tramadol 50 mg and lorazepam 0.5 mg. A prescription was printed previously but it was for the incorrect amounts. A new rx was sent to the requested pharmacy, [facility].

## 2018-01-17 LAB — TSH: TSH: 3 (ref 0.41–5.90)

## 2018-04-22 ENCOUNTER — Inpatient Hospital Stay (HOSPITAL_COMMUNITY)
Admission: EM | Admit: 2018-04-22 | Discharge: 2018-04-28 | DRG: 177 | Disposition: A | Discharge status: Skilled Nursing Facility | Payer: Medicare Other | Source: Skilled Nursing Facility | Attending: Internal Medicine | Admitting: Internal Medicine

## 2018-04-22 ENCOUNTER — Encounter (HOSPITAL_COMMUNITY): Payer: Self-pay | Admitting: Emergency Medicine

## 2018-04-22 ENCOUNTER — Emergency Department (HOSPITAL_COMMUNITY): Payer: Medicare Other

## 2018-04-22 DIAGNOSIS — N401 Enlarged prostate with lower urinary tract symptoms: Secondary | ICD-10-CM | POA: Diagnosis present

## 2018-04-22 DIAGNOSIS — N185 Chronic kidney disease, stage 5: Secondary | ICD-10-CM | POA: Diagnosis present

## 2018-04-22 DIAGNOSIS — Z79899 Other long term (current) drug therapy: Secondary | ICD-10-CM | POA: Diagnosis not present

## 2018-04-22 DIAGNOSIS — Z87891 Personal history of nicotine dependence: Secondary | ICD-10-CM | POA: Diagnosis not present

## 2018-04-22 DIAGNOSIS — J690 Pneumonitis due to inhalation of food and vomit: Principal | ICD-10-CM | POA: Diagnosis present

## 2018-04-22 DIAGNOSIS — I25119 Atherosclerotic heart disease of native coronary artery with unspecified angina pectoris: Secondary | ICD-10-CM | POA: Diagnosis not present

## 2018-04-22 DIAGNOSIS — R1310 Dysphagia, unspecified: Secondary | ICD-10-CM | POA: Diagnosis present

## 2018-04-22 DIAGNOSIS — Z66 Do not resuscitate: Secondary | ICD-10-CM | POA: Diagnosis present

## 2018-04-22 DIAGNOSIS — G301 Alzheimer's disease with late onset: Secondary | ICD-10-CM | POA: Diagnosis not present

## 2018-04-22 DIAGNOSIS — E162 Hypoglycemia, unspecified: Secondary | ICD-10-CM | POA: Diagnosis present

## 2018-04-22 DIAGNOSIS — E785 Hyperlipidemia, unspecified: Secondary | ICD-10-CM | POA: Diagnosis present

## 2018-04-22 DIAGNOSIS — I120 Hypertensive chronic kidney disease with stage 5 chronic kidney disease or end stage renal disease: Secondary | ICD-10-CM | POA: Diagnosis present

## 2018-04-22 DIAGNOSIS — F03918 Unspecified dementia, unspecified severity, with other behavioral disturbance: Secondary | ICD-10-CM | POA: Diagnosis present

## 2018-04-22 DIAGNOSIS — G9341 Metabolic encephalopathy: Secondary | ICD-10-CM | POA: Diagnosis present

## 2018-04-22 DIAGNOSIS — K219 Gastro-esophageal reflux disease without esophagitis: Secondary | ICD-10-CM | POA: Diagnosis present

## 2018-04-22 DIAGNOSIS — Z833 Family history of diabetes mellitus: Secondary | ICD-10-CM | POA: Diagnosis not present

## 2018-04-22 DIAGNOSIS — I2510 Atherosclerotic heart disease of native coronary artery without angina pectoris: Secondary | ICD-10-CM | POA: Diagnosis present

## 2018-04-22 DIAGNOSIS — N184 Chronic kidney disease, stage 4 (severe): Secondary | ICD-10-CM

## 2018-04-22 DIAGNOSIS — F0391 Unspecified dementia with behavioral disturbance: Secondary | ICD-10-CM | POA: Diagnosis present

## 2018-04-22 DIAGNOSIS — B953 Streptococcus pneumoniae as the cause of diseases classified elsewhere: Secondary | ICD-10-CM | POA: Diagnosis present

## 2018-04-22 DIAGNOSIS — I480 Paroxysmal atrial fibrillation: Secondary | ICD-10-CM | POA: Diagnosis present

## 2018-04-22 DIAGNOSIS — I10 Essential (primary) hypertension: Secondary | ICD-10-CM | POA: Diagnosis not present

## 2018-04-22 DIAGNOSIS — R4182 Altered mental status, unspecified: Secondary | ICD-10-CM | POA: Diagnosis not present

## 2018-04-22 DIAGNOSIS — Z8249 Family history of ischemic heart disease and other diseases of the circulatory system: Secondary | ICD-10-CM | POA: Diagnosis not present

## 2018-04-22 DIAGNOSIS — F05 Delirium due to known physiological condition: Secondary | ICD-10-CM | POA: Diagnosis present

## 2018-04-22 DIAGNOSIS — Z7982 Long term (current) use of aspirin: Secondary | ICD-10-CM

## 2018-04-22 DIAGNOSIS — R509 Fever, unspecified: Secondary | ICD-10-CM

## 2018-04-22 DIAGNOSIS — J189 Pneumonia, unspecified organism: Secondary | ICD-10-CM | POA: Diagnosis present

## 2018-04-22 DIAGNOSIS — R338 Other retention of urine: Secondary | ICD-10-CM | POA: Diagnosis present

## 2018-04-22 DIAGNOSIS — F0281 Dementia in other diseases classified elsewhere with behavioral disturbance: Secondary | ICD-10-CM | POA: Diagnosis not present

## 2018-04-22 LAB — CBC WITH DIFFERENTIAL/PLATELET
Abs Immature Granulocytes: 0.04 10*3/uL (ref 0.00–0.07)
Basophils Absolute: 0 10*3/uL (ref 0.0–0.1)
Basophils Relative: 0 %
Eosinophils Absolute: 0 10*3/uL (ref 0.0–0.5)
Eosinophils Relative: 0 %
HCT: 32.9 % — ABNORMAL LOW (ref 39.0–52.0)
Hemoglobin: 10.1 g/dL — ABNORMAL LOW (ref 13.0–17.0)
Immature Granulocytes: 1 %
Lymphocytes Relative: 13 %
Lymphs Abs: 0.4 10*3/uL — ABNORMAL LOW (ref 0.7–4.0)
MCH: 30.9 pg (ref 26.0–34.0)
MCHC: 30.7 g/dL (ref 30.0–36.0)
MCV: 100.6 fL — ABNORMAL HIGH (ref 80.0–100.0)
Monocytes Absolute: 0.2 10*3/uL (ref 0.1–1.0)
Monocytes Relative: 6 %
Neutro Abs: 2.6 10*3/uL (ref 1.7–7.7)
Neutrophils Relative %: 80 %
Platelets: 188 10*3/uL (ref 150–400)
RBC: 3.27 MIL/uL — ABNORMAL LOW (ref 4.22–5.81)
RDW: 13.6 % (ref 11.5–15.5)
WBC Morphology: INCREASED
WBC: 3.2 10*3/uL — ABNORMAL LOW (ref 4.0–10.5)
nRBC: 0 % (ref 0.0–0.2)

## 2018-04-22 LAB — COMPREHENSIVE METABOLIC PANEL
ALT: 10 U/L (ref 0–44)
AST: 20 U/L (ref 15–41)
Albumin: 3.5 g/dL (ref 3.5–5.0)
Alkaline Phosphatase: 49 U/L (ref 38–126)
Anion gap: 12 (ref 5–15)
BUN: 38 mg/dL — ABNORMAL HIGH (ref 8–23)
CO2: 19 mmol/L — ABNORMAL LOW (ref 22–32)
Calcium: 8.1 mg/dL — ABNORMAL LOW (ref 8.9–10.3)
Chloride: 104 mmol/L (ref 98–111)
Creatinine, Ser: 2.9 mg/dL — ABNORMAL HIGH (ref 0.61–1.24)
GFR calc Af Amer: 21 mL/min — ABNORMAL LOW (ref >60)
GFR calc non Af Amer: 18 mL/min — ABNORMAL LOW (ref >60)
Glucose, Bld: 53 mg/dL — ABNORMAL LOW (ref 70–99)
Potassium: 4.1 mmol/L (ref 3.5–5.1)
Sodium: 135 mmol/L (ref 135–145)
Total Bilirubin: 0.5 mg/dL (ref 0.3–1.2)
Total Protein: 6.5 g/dL (ref 6.5–8.1)

## 2018-04-22 LAB — TROPONIN I: Troponin I: 0.05 ng/mL (ref <0.03)

## 2018-04-22 LAB — I-STAT CG4 LACTIC ACID, ED
Lactic Acid, Venous: 2.62 mmol/L (ref 0.5–1.9)
Lactic Acid, Venous: 1.89 mmol/L (ref 0.5–1.9)

## 2018-04-22 LAB — CBG MONITORING, ED: Glucose-Capillary: 56 mg/dL — ABNORMAL LOW (ref 70–99)

## 2018-04-22 MED ORDER — ENOXAPARIN SODIUM 40 MG/0.4ML ~~LOC~~ SOLN
40.0000 mg | SUBCUTANEOUS | Status: DC
  Administered 2018-04-23 – 2018-04-24 (×2): 40 mg via SUBCUTANEOUS
  Filled 2018-04-22 (×2): qty 0.4

## 2018-04-22 MED ORDER — ASPIRIN EC 81 MG PO TBEC
81.0000 mg | DELAYED_RELEASE_TABLET | Freq: Every day | ORAL | Status: DC
  Administered 2018-04-23 – 2018-04-28 (×6): 81 mg via ORAL
  Filled 2018-04-22 (×6): qty 1

## 2018-04-22 MED ORDER — VITAMIN B-12 1000 MCG PO TABS
1000.0000 ug | ORAL_TABLET | Freq: Every day | ORAL | Status: DC
  Administered 2018-04-23 – 2018-04-28 (×6): 1000 ug via ORAL
  Filled 2018-04-22 (×6): qty 1

## 2018-04-22 MED ORDER — LOPERAMIDE HCL 2 MG PO CAPS: 2.0000 mg | ORAL_CAPSULE | ORAL | Status: DC | PRN

## 2018-04-22 MED ORDER — SODIUM CHLORIDE 0.9 % IV BOLUS (SEPSIS)
1000.0000 mL | Freq: Once | INTRAVENOUS | Status: AC
  Administered 2018-04-22: 1000 mL via INTRAVENOUS

## 2018-04-22 MED ORDER — VANCOMYCIN HCL IN DEXTROSE 1-5 GM/200ML-% IV SOLN
1000.0000 mg | Freq: Once | INTRAVENOUS | Status: AC
  Administered 2018-04-23: 1000 mg via INTRAVENOUS
  Filled 2018-04-22: qty 200

## 2018-04-22 MED ORDER — SODIUM CHLORIDE 0.9 % IV SOLN
500.0000 mg | INTRAVENOUS | Status: DC
  Administered 2018-04-22: 500 mg via INTRAVENOUS
  Filled 2018-04-22: qty 500

## 2018-04-22 MED ORDER — ACETAMINOPHEN 325 MG RE SUPP
325.0000 mg | Freq: Once | RECTAL | Status: AC
  Administered 2018-04-22: 325 mg via RECTAL
  Filled 2018-04-22: qty 1

## 2018-04-22 MED ORDER — AMIODARONE HCL 200 MG PO TABS
200.0000 mg | ORAL_TABLET | Freq: Every day | ORAL | Status: DC
  Administered 2018-04-23 – 2018-04-28 (×6): 200 mg via ORAL
  Filled 2018-04-22 (×6): qty 1

## 2018-04-22 MED ORDER — BACITRACIN-NEOMYCIN-POLYMYXIN OINTMENT TUBE
1.0000 "application " | TOPICAL_OINTMENT | Freq: Four times a day (QID) | CUTANEOUS | Status: DC | PRN
  Filled 2018-04-22: qty 14

## 2018-04-22 MED ORDER — DEXTROSE 50 % IV SOLN
INTRAVENOUS | Status: AC
  Administered 2018-04-22: 20:00:00
  Filled 2018-04-22: qty 50

## 2018-04-22 MED ORDER — MAGNESIUM HYDROXIDE 400 MG/5ML PO SUSP: 30.0000 mL | Freq: Every evening | ORAL | Status: DC | PRN

## 2018-04-22 MED ORDER — LORAZEPAM 0.5 MG PO TABS
0.5000 mg | ORAL_TABLET | Freq: Three times a day (TID) | ORAL | Status: DC | PRN
  Administered 2018-04-23 – 2018-04-28 (×7): 0.5 mg via ORAL
  Filled 2018-04-22 (×8): qty 1

## 2018-04-22 MED ORDER — ACETAMINOPHEN 500 MG PO TABS: 500.0000 mg | ORAL_TABLET | Freq: Four times a day (QID) | ORAL | Status: DC | PRN

## 2018-04-22 MED ORDER — SODIUM CHLORIDE 0.9 % IV SOLN
1.0000 g | Freq: Once | INTRAVENOUS | Status: AC
  Administered 2018-04-22: 1 g via INTRAVENOUS
  Filled 2018-04-22: qty 10

## 2018-04-22 MED ORDER — SODIUM CHLORIDE 0.9 % IV SOLN
1.0000 g | INTRAVENOUS | Status: DC
  Administered 2018-04-22: 1 g via INTRAVENOUS
  Filled 2018-04-22: qty 10

## 2018-04-22 MED ORDER — GUAIFENESIN 100 MG/5ML PO SOLN: 200.0000 mg | Freq: Four times a day (QID) | ORAL | Status: DC | PRN

## 2018-04-22 MED ORDER — HYDRALAZINE HCL 50 MG PO TABS
100.0000 mg | ORAL_TABLET | Freq: Three times a day (TID) | ORAL | Status: DC
  Administered 2018-04-23 – 2018-04-28 (×17): 100 mg via ORAL
  Filled 2018-04-22 (×17): qty 2

## 2018-04-22 MED ORDER — PANTOPRAZOLE SODIUM 40 MG PO TBEC
40.0000 mg | DELAYED_RELEASE_TABLET | Freq: Every day | ORAL | Status: DC
  Administered 2018-04-23 – 2018-04-28 (×6): 40 mg via ORAL
  Filled 2018-04-22 (×6): qty 1

## 2018-04-22 MED ORDER — ALUM & MAG HYDROXIDE-SIMETH 200-200-20 MG/5ML PO SUSP: 30.0000 mL | Freq: Four times a day (QID) | ORAL | Status: DC | PRN

## 2018-04-22 MED ORDER — SODIUM CHLORIDE 0.9 % IV SOLN
1.0000 g | Freq: Three times a day (TID) | INTRAVENOUS | Status: DC
  Filled 2018-04-22 (×2): qty 1

## 2018-04-22 MED ORDER — ACETAMINOPHEN 650 MG RE SUPP
650.0000 mg | Freq: Once | RECTAL | Status: AC
  Administered 2018-04-22: 650 mg via RECTAL
  Filled 2018-04-22: qty 1

## 2018-04-22 MED ORDER — TAMSULOSIN HCL 0.4 MG PO CAPS
0.4000 mg | ORAL_CAPSULE | Freq: Every evening | ORAL | Status: DC
  Administered 2018-04-23 – 2018-04-27 (×5): 0.4 mg via ORAL
  Filled 2018-04-22 (×5): qty 1

## 2018-04-22 MED ORDER — SODIUM CHLORIDE 0.9 % IV BOLUS (SEPSIS)
250.0000 mL | Freq: Once | INTRAVENOUS | Status: AC
  Administered 2018-04-22: 250 mL via INTRAVENOUS

## 2018-04-22 MED ORDER — SODIUM CHLORIDE 0.9 % IV SOLN
INTRAVENOUS | Status: DC
  Administered 2018-04-23: 01:00:00 via INTRAVENOUS
  Administered 2018-04-23: 1000 mL via INTRAVENOUS
  Administered 2018-04-24: 03:00:00 via INTRAVENOUS

## 2018-04-22 MED ORDER — TRAMADOL HCL 50 MG PO TABS
50.0000 mg | ORAL_TABLET | Freq: Four times a day (QID) | ORAL | Status: DC | PRN
  Administered 2018-04-24 – 2018-04-25 (×2): 50 mg via ORAL
  Filled 2018-04-22 (×2): qty 1

## 2018-04-22 MED ORDER — AMLODIPINE BESYLATE 10 MG PO TABS
10.0000 mg | ORAL_TABLET | Freq: Every day | ORAL | Status: DC
  Administered 2018-04-23 – 2018-04-28 (×6): 10 mg via ORAL
  Filled 2018-04-22 (×6): qty 1

## 2018-04-22 MED ORDER — MEMANTINE HCL 10 MG PO TABS
5.0000 mg | ORAL_TABLET | Freq: Every day | ORAL | Status: DC
  Administered 2018-04-23 – 2018-04-28 (×6): 5 mg via ORAL
  Filled 2018-04-22 (×6): qty 1

## 2018-04-22 MED ORDER — METOPROLOL SUCCINATE ER 25 MG PO TB24
25.0000 mg | ORAL_TABLET | Freq: Every day | ORAL | Status: DC
  Administered 2018-04-23 – 2018-04-28 (×6): 25 mg via ORAL
  Filled 2018-04-22 (×6): qty 1

## 2018-04-22 MED ORDER — FERROUS SULFATE 325 (65 FE) MG PO TABS
325.0000 mg | ORAL_TABLET | Freq: Every day | ORAL | Status: DC
  Administered 2018-04-23 – 2018-04-28 (×6): 325 mg via ORAL
  Filled 2018-04-22 (×6): qty 1

## 2018-04-22 MED ORDER — SODIUM BICARBONATE 650 MG PO TABS
650.0000 mg | ORAL_TABLET | Freq: Two times a day (BID) | ORAL | Status: DC
  Administered 2018-04-23 – 2018-04-28 (×12): 650 mg via ORAL
  Filled 2018-04-22 (×12): qty 1

## 2018-04-22 MED ORDER — MELATONIN 3 MG PO TABS
3.0000 mg | ORAL_TABLET | Freq: Every day | ORAL | Status: DC
  Administered 2018-04-23 – 2018-04-27 (×6): 3 mg via ORAL
  Filled 2018-04-22 (×6): qty 1

## 2018-04-22 MED ORDER — LORAZEPAM 2 MG/ML IJ SOLN
INTRAMUSCULAR | Status: AC
  Administered 2018-04-22: 1 mg
  Filled 2018-04-22: qty 1

## 2018-04-22 NOTE — ED Provider Notes (Addendum)
[facility] EMERGENCY DEPARTMENT Provider Note   CSN: [id number] Arrival date & time: 04/22/18  1957     History   Chief Complaint Chief Complaint  Patient presents with  . Fever  . Altered Mental Status    HPI Wayne Fuller is a 84 y.o. male.  Pt presents to the ED today with AMS.  Pt lives at a SNF and has a hx of dementia.  This evening, he came out of his room and was very agitated.  The pt does not normally have behavior problems.  The pt had a fever per EMS.  The BP was ok and EMS started some IVFs.  Pt is unable to contribute to the hx.  Pt does have a hx of afib, but is not on anticoagulants due to fall risk.  No known trauma per EMS.     Past Medical History:  Diagnosis Date  . BPH (benign prostatic hyperplasia)   . Chronic kidney disease    ckd stage 3  . Dementia ([place])   . GERD (gastroesophageal reflux disease)   . Hemorrhoids   . History of angina   . Hyperlipemia   . Hypertension   . Irregular heart rhythm   . Paroxysmal A-fib ([place]) 04/02/2015    Patient Active Problem List   Diagnosis Date Noted  . S/p left hip fracture 09/29/2016  . Dehydration   . 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 Dislocation of internal left hip prosthesis ([place]) 09/21/2016  . Hyperkalemia 09/21/2016  . Protein-calorie malnutrition, severe 09/21/2016  . Anemia, chronic disease 01/30/2016  . Acute kidney injury superimposed on CKD ([place]) 01/30/2016  . Dementia with behavioral disturbance ([place]) 01/19/2016  . Back pain 01/19/2016  . Elevated troponin 04/02/2015  . Paroxysmal A-fib ([place]) 04/02/2015  . CKD (chronic kidney disease) stage 4, GFR 15-29 ml/min ([facility]) 08/11/2014  . GERD (gastroesophageal reflux disease) 08/11/2014  . Essential hypertension 04/29/2009  . CAD (coronary artery disease) 04/29/2009  . TOBACCO ABUSE, HX OF 04/29/2009    Past Surgical History:  Procedure Laterality Date  . BACK SURGERY    .  CERVICAL SPINE SURGERY    . SPINE SURGERY          Home Medications    Prior to Admission medications   Medication Sig Start Date End Date Taking? Authorizing Provider  acetaminophen (TYLENOL) 500 MG tablet Take 500 mg by mouth every 6 (six) hours as needed for headache (minor discomfort, fever 99.5-101F).   Yes [provider]  alum & mag hydroxide-simeth (MINTOX) 200-200-20 MG/5ML suspension Take 30 mLs by mouth 4 (four) times daily as needed for indigestion or heartburn.   Yes [provider]  amiodarone (PACERONE) 200 MG tablet Take 1 tablet (200 mg total) by mouth daily. Patient taking differently: Take 200 mg by mouth daily. For A-FIB 10/20/16  Yes [name], NP  amLODipine (NORVASC) 10 MG tablet Take 1 tablet (10 mg total) by mouth daily. Patient taking differently: Take 10 mg by mouth daily. For HTN 10/20/16  Yes [name], NP  aspirin EC 81 MG tablet Take 81 mg by mouth daily. For CAD   Yes [provider]  cyanocobalamin 1000 MCG tablet Take 1,000 mcg by mouth daily. Vitamin B12   Yes [provider]  ferrous sulfate 325 (65 FE) MG tablet Take 325 mg by mouth daily. For Anemia   Yes [provider]  guaifenesin (ROBAFEN) 100 MG/5ML syrup Take 200 mg  by mouth every 6 (six) hours as needed for cough.   Yes [provider]  hydrALAZINE (APRESOLINE) 100 MG tablet Take 1 tablet (100 mg total) by mouth 3 (three) times daily. Patient taking differently: Take 100 mg by mouth 3 (three) times daily. For HTN 10/20/16  Yes [name], NP  loperamide (IMODIUM) 2 MG capsule Take 2 mg by mouth as needed for diarrhea or loose stools (not to exceed 8 doses in 24 hours).   Yes [provider]  LORazepam (ATIVAN) 0.5 MG tablet Take 1 tablet (0.5 mg total) by mouth every 8 (eight) hours as needed (agitation). 01/09/18  Yes Eulas Post, Monica, DO  magnesium hydroxide (MILK OF MAGNESIA) 400 MG/5ML suspension Take 30 mLs by  mouth at bedtime as needed (constipation).   Yes [provider]  Melatonin 3 MG TABS Take 3 mg by mouth at bedtime. For sleep   Yes [provider]  memantine (NAMENDA) 5 MG tablet Take 5 mg by mouth daily. For Dementia   Yes [provider]  metoprolol succinate (TOPROL-XL) 25 MG 24 hr tablet Take 1 tablet (25 mg total) by mouth daily. Patient taking differently: Take 25 mg by mouth daily. For A-Fib 08/24/16  Yes [name], NP  Neomycin-Bacitracin-Polymyxin (TRIPLE ANTIBIOTIC) [id number] OINT Apply 1 application topically 4 (four) times daily as needed (minor skin tears or abrasions). Clean area with normal saline, apply triple antibiotic, cover with bandaid or gauze and tape, change as needed until healed.   Yes [provider]  omeprazole (PRILOSEC) 20 MG capsule Take 20 mg by mouth daily at 6 (six) AM. For GERD   Yes [provider]  sodium bicarbonate 650 MG tablet Take 1 tablet (650 mg total) by mouth 2 (two) times daily. Patient taking differently: Take 650 mg by mouth 2 (two) times daily. For CKD 09/22/16  Yes Barton Dubois, MD  tamsulosin (FLOMAX) 0.4 MG CAPS capsule TAKE 1 CAPSULE(0.4 MG) BY MOUTH DAILY AFTER SUPPER Patient taking differently: Take 0.4 mg by mouth every evening.  06/21/16  Yes [name], NP  traMADol (ULTRAM) 50 MG tablet Take 1 tablet (50 mg total) by mouth every 6 (six) hours as needed for moderate pain. 01/09/18  Yes Gildardo Cranker, DO    Family History Family History  Problem Relation Age of Onset  . Stroke Mother   . Hypertension Sister   . Other Son        Homicide, stabbed  . Stroke Son   . Hypertension Son   . Diabetes Son   . Hypertension Son     Social History Social History   Tobacco Use  . Smoking status: Former Smoker    Packs/day: 0.50    Years: 10.00    Pack years: 5.00    Types: Cigarettes  . Smokeless tobacco: Never Used  . Tobacco comment: quit smoking  around 1996    Substance Use Topics  . Alcohol use: No    Comment: hx of occasional ETOH use  . Drug use: No     Allergies   Patient has no known allergies.   Review of Systems Review of Systems  Unable to perform ROS: Dementia     Physical Exam Updated Vital Signs BP (!) 156/56   Pulse 79   Temp (!) 103.7 F (39.8 C) (Rectal)   Resp 19   SpO2 100%   Physical Exam  Constitutional: He appears well-developed and well-nourished.  HENT:  Head: Normocephalic and atraumatic.  Right Ear: External ear normal.  Left Ear: External ear normal.  Nose: Nose normal.  Mouth/Throat: Mucous membranes are dry.  Eyes: Pupils are equal, round, and reactive to light. Conjunctivae and EOM are normal.  Neck: Normal range of motion. Neck supple.  Cardiovascular: Normal rate, regular rhythm, normal heart sounds and intact distal pulses.  Pulmonary/Chest: Effort normal and breath sounds normal.  Abdominal: Soft. Bowel sounds are normal.  Musculoskeletal: Normal range of motion.  Neurological: He is alert.  Pt is not oriented, but is moving all 4 extremities.  Skin: Skin is warm. Capillary refill takes less than 2 seconds.  Psychiatric: His mood appears anxious. He is agitated. Cognition and memory are impaired.  Nursing note and vitals reviewed.    ED Treatments / Results  Labs (all labs ordered are listed, but only abnormal results are displayed) Labs Reviewed  COMPREHENSIVE METABOLIC PANEL - Abnormal; Notable for the following components:      Result Value   CO2 19 (*)    Glucose, Bld 53 (*)    BUN 38 (*)    Creatinine, Ser 2.90 (*)    Calcium 8.1 (*)    GFR calc non Af Amer 18 (*)    GFR calc Af Amer 21 (*)    All other components within normal limits  CBC WITH DIFFERENTIAL/PLATELET - Abnormal; Notable for the following components:   WBC 3.2 (*)    RBC 3.27 (*)    Hemoglobin 10.1 (*)    HCT 32.9 (*)    MCV 100.6 (*)    Lymphs Abs 0.4 (*)    All other components within normal limits   TROPONIN I - Abnormal; Notable for the following components:   Troponin I 0.05 (*)    All other components within normal limits  I-STAT CG4 LACTIC ACID, ED - Abnormal; Notable for the following components:   Lactic Acid, Venous 2.62 (*)    All other components within normal limits  CBG MONITORING, ED - Abnormal; Notable for the following components:   Glucose-Capillary 56 (*)    All other components within normal limits  CULTURE, BLOOD (ROUTINE X 2)  CULTURE, BLOOD (ROUTINE X 2)  URINE CULTURE  URINALYSIS, ROUTINE W REFLEX MICROSCOPIC  INFLUENZA PANEL BY PCR (TYPE A & B)  I-STAT CG4 LACTIC ACID, ED    EKG EKG Interpretation  Date/Time:  Sunday April 22 2018 20:06:01 EST Ventricular Rate:  73 PR Interval:    QRS Duration: 97 QT Interval:  354 QTC Calculation: 390 R Axis:   -20 Text Interpretation:  Sinus rhythm Atrial premature complex Borderline prolonged PR interval Abnormal R-wave progression, early transition LVH with secondary repolarization abnormality No significant change since last tracing Confirmed by Isla Pence [phone]) on 04/22/2018 8:09:13 PM   Radiology Dg Chest Port 1 View  Result Date: 04/22/2018 CLINICAL DATA:  Altered mental status and fever. EXAM: PORTABLE CHEST 1 VIEW COMPARISON:  09/11/2016 FINDINGS: Lungs are adequately inflated demonstrate patchy hazy bilateral airspace process most notable over the left base and right mid to upper lung. No definite effusion. Cardiomediastinal silhouette and remainder of the exam is unchanged. IMPRESSION: Hazy bilateral multifocal airspace process likely infection. Electronically Signed   By: Marin Olp M.D.   On: 04/22/2018 20:16    Procedures Procedures (including critical care time)  Medications Ordered in ED Medications  sodium chloride 0.9 % bolus 1,000 mL (0 mLs Intravenous Stopped 04/22/18 2110)    And  sodium chloride 0.9 % bolus 1,000 mL (1,000  mLs Intravenous New Bag/Given 04/22/18 2106)    And   sodium chloride 0.9 % bolus 250 mL (has no administration in time range)  cefTRIAXone (ROCEPHIN) 1 g in sodium chloride 0.9 % 100 mL IVPB (0 g Intravenous Stopped 04/22/18 2111)  azithromycin (ZITHROMAX) 500 mg in sodium chloride 0.9 % 250 mL IVPB (500 mg Intravenous New Bag/Given 04/22/18 2109)  LORazepam (ATIVAN) 2 MG/ML injection (1 mg  Given 04/22/18 2007)  acetaminophen (TYLENOL) suppository 650 mg (650 mg Rectal Given 04/22/18 2018)  acetaminophen (TYLENOL) suppository 325 mg (325 mg Rectal Given 04/22/18 2018)  dextrose 50 % solution (  Given 04/22/18 2018)  cefTRIAXone (ROCEPHIN) 1 g in sodium chloride 0.9 % 100 mL IVPB (1 g Intravenous New Bag/Given 04/22/18 2104)     Initial Impression / Assessment and Plan / ED Course  I have reviewed the triage vital signs and the nursing notes.  Pertinent labs & imaging results that were available during my care of the patient were reviewed by me and considered in my medical decision making (see chart for details).    Pt is very agitated, so he was given ativan which did help relax him.  Code sepsis called and he was given sepsis fluids and 1g  IV rocephin for possible UTI.  CXR shows bilateral pna, so I added an additional 1g rocephin (2g rocephin recommended in pna sepsis) and zithromax.   BP has remained stable.  HR has remained stable.  Tylenol given for fever.  Pt d/w Dr. Jonelle Sidle (triad) for admission.  CRITICAL CARE Performed by: Isla Pence   Total critical care time: 30 minutes  Critical care time was exclusive of separately billable procedures and treating other patients.  Critical care was necessary to treat or prevent imminent or life-threatening deterioration.  Critical care was time spent personally by me on the following activities: development of treatment plan with patient and/or surrogate as well as nursing, discussions with consultants, evaluation of patient's response to treatment, examination of patient, obtaining history  from patient or surrogate, ordering and performing treatments and interventions, ordering and review of laboratory studies, ordering and review of radiographic studies, pulse oximetry and re-evaluation of patient's condition.  Final Clinical Impressions(s) / ED Diagnoses   Final diagnoses:  Pneumonia of both lungs due to infectious organism, unspecified part of lung  Metabolic encephalopathy  Fever in adult  CKD (chronic kidney disease) stage 4, GFR 15-29 ml/min [facility])    ED Discharge Orders    None       Isla Pence, MD 04/22/18 2134    Isla Pence, MD 04/22/18 2152

## 2018-04-22 NOTE — ED Notes (Signed)
Admitting MD at bedside at this time.

## 2018-04-22 NOTE — H&P (Signed)
History and Physical   Wayne Fuller VXB:[id number] DOB: 1923-08-23 DOA: 04/22/2018  Referring MD/NP/PA: Dr. Gilford Raid  PCP: [name], NP    Patient coming from: Skilled facility  Chief Complaint: Altered mental status  HPI: Wayne Fuller is a 84 y.o. male with medical history significant of dementia, chronic kidney disease, BPH, hypertension, paroxysmal atrial fibrillation, GERD and coronary artery disease who was brought in from skilled nursing facility due to agitation and altered mental status.  Patient has normally been fine without any behavioral difficulties.  He was seen by family to be looking different and acting differently.  He is unable to give adequate history.  No fever or chills noted.  He is usually in avid eater but has not been eating adequately.  He was therefore brought in for work-up where he was found to have bilateral pneumonia.  No falls no trauma noted.  Patient has history of atrial fibrillation but not on anticoagulants.  Patient is being admitted with bilateral pneumonia leading to metabolic encephalopathy.  ED Course: Temperature was 103.7 blood pressure 156/56 pulse 81 respiratory 23 oxygen sat 93 on room air.  Lactic acid 2.75 white count 3.2 hemoglobin 10.1 and platelets of 188.  Sodium 135 CO2 of 19 with BUN to 38 creatinine 2.90 calcium 8.1 troponin 0 0.05 and glucose of only 53.  Chest x-ray showed bilateral pneumonia.   Review of Systems: As per HPI otherwise 10 point review of systems negative.    Past Medical History:  Diagnosis Date  . BPH (benign prostatic hyperplasia)   . Chronic kidney disease    ckd stage 3  . Dementia ([place])   . GERD (gastroesophageal reflux disease)   . Hemorrhoids   . History of angina   . Hyperlipemia   . Hypertension   . Irregular heart rhythm   . Paroxysmal A-fib ([place]) 04/02/2015    Past Surgical History:  Procedure Laterality Date  . BACK SURGERY    . CERVICAL SPINE SURGERY    . SPINE SURGERY       reports that he has quit smoking. His smoking use included cigarettes. He has a 5.00 pack-year smoking history. He has never used smokeless tobacco. He reports that he does not drink alcohol or use drugs.  No Known Allergies  Family History  Problem Relation Age of Onset  . Stroke Mother   . Hypertension Sister   . Other Son        Homicide, stabbed  . Stroke Son   . Hypertension Son   . Diabetes Son   . Hypertension Son      Prior to Admission medications   Medication Sig Start Date End Date Taking? Authorizing Provider  acetaminophen (TYLENOL) 500 MG tablet Take 500 mg by mouth every 6 (six) hours as needed for headache (minor discomfort, fever 99.5-101F).   Yes [provider]  alum & mag hydroxide-simeth (MINTOX) 200-200-20 MG/5ML suspension Take 30 mLs by mouth 4 (four) times daily as needed for indigestion or heartburn.   Yes [provider]  amiodarone (PACERONE) 200 MG tablet Take 1 tablet (200 mg total) by mouth daily. Patient taking differently: Take 200 mg by mouth daily. For A-FIB 10/20/16  Yes [name], NP  amLODipine (NORVASC) 10 MG tablet Take 1 tablet (10 mg total) by mouth daily. Patient taking differently: Take 10 mg by mouth daily. For HTN 10/20/16  Yes [name], NP  aspirin EC 81 MG tablet Take 81 mg by mouth daily. For  CAD   Yes [provider]  cyanocobalamin 1000 MCG tablet Take 1,000 mcg by mouth daily. Vitamin B12   Yes [provider]  ferrous sulfate 325 (65 FE) MG tablet Take 325 mg by mouth daily. For Anemia   Yes [provider]  guaifenesin (ROBAFEN) 100 MG/5ML syrup Take 200 mg by mouth every 6 (six) hours as needed for cough.   Yes [provider]  hydrALAZINE (APRESOLINE) 100 MG tablet Take 1 tablet (100 mg total) by mouth 3 (three) times daily. Patient taking differently: Take 100 mg by mouth 3 (three) times daily. For HTN 10/20/16  Yes [name], NP  loperamide (IMODIUM)  2 MG capsule Take 2 mg by mouth as needed for diarrhea or loose stools (not to exceed 8 doses in 24 hours).   Yes [provider]  LORazepam (ATIVAN) 0.5 MG tablet Take 1 tablet (0.5 mg total) by mouth every 8 (eight) hours as needed (agitation). 01/09/18  Yes Eulas Post, Monica, DO  magnesium hydroxide (MILK OF MAGNESIA) 400 MG/5ML suspension Take 30 mLs by mouth at bedtime as needed (constipation).   Yes [provider]  Melatonin 3 MG TABS Take 3 mg by mouth at bedtime. For sleep   Yes [provider]  memantine (NAMENDA) 5 MG tablet Take 5 mg by mouth daily. For Dementia   Yes [provider]  metoprolol succinate (TOPROL-XL) 25 MG 24 hr tablet Take 1 tablet (25 mg total) by mouth daily. Patient taking differently: Take 25 mg by mouth daily. For A-Fib 08/24/16  Yes [name], NP  Neomycin-Bacitracin-Polymyxin (TRIPLE ANTIBIOTIC) [phone] OINT Apply 1 application topically 4 (four) times daily as needed (minor skin tears or abrasions). Clean area with normal saline, apply triple antibiotic, cover with bandaid or gauze and tape, change as needed until healed.   Yes [provider]  omeprazole (PRILOSEC) 20 MG capsule Take 20 mg by mouth daily at 6 (six) AM. For GERD   Yes [provider]  sodium bicarbonate 650 MG tablet Take 1 tablet (650 mg total) by mouth 2 (two) times daily. Patient taking differently: Take 650 mg by mouth 2 (two) times daily. For CKD 09/22/16  Yes Barton Dubois, MD  tamsulosin (FLOMAX) 0.4 MG CAPS capsule TAKE 1 CAPSULE(0.4 MG) BY MOUTH DAILY AFTER SUPPER Patient taking differently: Take 0.4 mg by mouth every evening.  06/21/16  Yes [name], NP  traMADol (ULTRAM) 50 MG tablet Take 1 tablet (50 mg total) by mouth every 6 (six) hours as needed for moderate pain. 01/09/18  Yes Gildardo Cranker, DO    Physical Exam: Vitals:   04/22/18 2200 04/22/18 2215 04/22/18 2230 04/22/18 2238  BP: (!) 147/74 131/61 135/66     Pulse: 81 80 80   Resp: (!) 21 (!) 21 (!) 21   Temp:    98.8 F (37.1 C)  TempSrc:    Oral  SpO2: 95% 95% 96%       Constitutional: NAD, calm, comfortable, chronically ill looking and appears malnourished Vitals:   04/22/18 2200 04/22/18 2215 04/22/18 2230 04/22/18 2238  BP: (!) 147/74 131/61 135/66   Pulse: 81 80 80   Resp: (!) 21 (!) 21 (!) 21   Temp:    98.8 F (37.1 C)  TempSrc:    Oral  SpO2: 95% 95% 96%    Eyes: PERRL, lids and conjunctivae normal ENMT: Mucous membranes are moist. Posterior pharynx clear of any exudate or lesions.Normal dentition.  Neck: normal,  supple, no masses, no thyromegaly Respiratory: Diffuse rhonchi bilaterally no expiratory wheezing, normal respiratory effort. No accessory muscle use.  Cardiovascular: Regular rate and rhythm, no murmurs / rubs / gallops. No extremity edema. 2+ pedal pulses. No carotid bruits.  Abdomen: no tenderness, no masses palpated. No hepatosplenomegaly. Bowel sounds positive.  Musculoskeletal: no clubbing / cyanosis. No joint deformity upper and lower extremities. Good ROM, no contractures. Normal muscle tone.  Skin: no rashes, lesions, ulcers. No induration Neurologic: CN 2-12 grossly intact. Sensation intact, DTR normal. Strength 5/5 in all 4.  Psychiatric: Normal judgment and insight. Alert and oriented x 3. Normal mood.     Labs on Admission: I have personally reviewed following labs and imaging studies  CBC: Recent Labs  Lab 04/22/18 2009  WBC 3.2*  NEUTROABS 2.6  HGB 10.1*  HCT 32.9*  MCV 100.6*  PLT 272   Basic Metabolic Panel: Recent Labs  Lab 04/22/18 2009  NA 135  K 4.1  CL 104  CO2 19*  GLUCOSE 53*  BUN 38*  CREATININE 2.90*  CALCIUM 8.1*   GFR: CrCl cannot be calculated (Unknown ideal weight.). Liver Function Tests: Recent Labs  Lab 04/22/18 2009  AST 20  ALT 10  ALKPHOS 49  BILITOT 0.5  PROT 6.5  ALBUMIN 3.5   No results for input(s): LIPASE, AMYLASE in the last 168  hours. No results for input(s): AMMONIA in the last 168 hours. Coagulation Profile: No results for input(s): INR, PROTIME in the last 168 hours. Cardiac Enzymes: Recent Labs  Lab 04/22/18 2009  TROPONINI 0.05*   BNP (last 3 results) No results for input(s): PROBNP in the last 8760 hours. HbA1C: No results for input(s): HGBA1C in the last 72 hours. CBG: Recent Labs  Lab 04/22/18 2010  GLUCAP 56*   Lipid Profile: No results for input(s): CHOL, HDL, LDLCALC, TRIG, CHOLHDL, LDLDIRECT in the last 72 hours. Thyroid Function Tests: No results for input(s): TSH, T4TOTAL, FREET4, T3FREE, THYROIDAB in the last 72 hours. Anemia Panel: No results for input(s): VITAMINB12, FOLATE, FERRITIN, TIBC, IRON, RETICCTPCT in the last 72 hours. Urine analysis:    Component Value Date/Time   COLORURINE YELLOW 09/20/2016 2147   APPEARANCEUR CLOUDY (A) 09/20/2016 2147   LABSPEC 1.014 09/20/2016 2147   PHURINE 5.0 09/20/2016 2147   GLUCOSEU NEGATIVE 09/20/2016 2147   HGBUR MODERATE (A) 09/20/2016 2147   BILIRUBINUR NEGATIVE 09/20/2016 2147   [place] 09/20/2016 2147   PROTEINUR 100 (A) 09/20/2016 2147   UROBILINOGEN 0.2 04/02/2015 1430   NITRITE NEGATIVE 09/20/2016 2147   LEUKOCYTESUR NEGATIVE 09/20/2016 2147   Sepsis Labs: @LABRCNTIP (procalcitonin:4,lacticidven:4) )No results found for this or any previous visit (from the past 240 hour(s)).   Radiological Exams on Admission: Dg Chest Port 1 View  Result Date: 04/22/2018 CLINICAL DATA:  Altered mental status and fever. EXAM: PORTABLE CHEST 1 VIEW COMPARISON:  09/11/2016 FINDINGS: Lungs are adequately inflated demonstrate patchy hazy bilateral airspace process most notable over the left base and right mid to upper lung. No definite effusion. Cardiomediastinal silhouette and remainder of the exam is unchanged. IMPRESSION: Hazy bilateral multifocal airspace process likely infection. Electronically Signed   By: Marin Olp M.D.   On:  04/22/2018 20:16    Assessment/Plan Principal Problem:   HCAP (healthcare-associated pneumonia) Active Problems:   Essential hypertension   CAD (coronary artery disease)   TOBACCO ABUSE, HX OF   CKD (chronic kidney disease) stage 4, GFR 15-29 ml/min (HCC)   GERD (gastroesophageal reflux disease)   Paroxysmal  A-fib ([place])   Dementia with behavioral disturbance ([place])     #1 Healthcare associated pneumonia: Patient has sepsis with these.  Initiate cefepime with vancomycin's as patient is coming from skilled facility.  Blood cultures and sputum cultures will be followed and antibiotics will be adjusted accordingly.  #2 dementia: Confused and stable.  Continue previous regimen.  #3 metabolic encephalopathy: Toxic secondary to pneumonia most likely.  Continue to monitor.  #4 coronary artery disease: Continue with home regimen.  Monitoring closely.  No decompensation.  #5 paroxysmal atrial fibrillation: Currently in sinus rhythm.  Rate is controlled.  #6 hypoglycemia: Poor oral intake most likely.  Diet restarted.  #7 hypertension: Continue blood pressure medications from skilled facility  #8 chronic kidney disease stage III: BUN/creatinine appears slightly worsened from baseline but still within range.  Hydrate and follow in the morning.   DVT prophylaxis: Lovenox Code Status: Full code Family Communication: Daughter and son at bedside Disposition Plan: Back to skilled facility Consults called: None Admission status: Inpatient  Severity of Illness: The appropriate patient status for this patient is INPATIENT. Inpatient status is judged to be reasonable and necessary in order to provide the required intensity of service to ensure the patient's safety. The patient's presenting symptoms, physical exam findings, and initial radiographic and laboratory data in the context of their chronic comorbidities is felt to place them at high risk for further clinical deterioration. Furthermore,  it is not anticipated that the patient will be medically stable for discharge from the hospital within 2 midnights of admission. The following factors support the patient status of inpatient.   " The patient's presenting symptoms include confusion and shortness of breath. " The worrisome physical exam findings include bilateral rhonchi. " The initial radiographic and laboratory data are worrisome because of chest x-ray showing bilateral pneumonia. " The chronic co-morbidities include dementia and a nursing home resident.   * I certify that at the point of admission it is my clinical judgment that the patient will require inpatient hospital care spanning beyond 2 midnights from the point of admission due to high intensity of service, high risk for further deterioration and high frequency of surveillance required.Barbette Merino MD [facility] Pager [phone]  If 7PM-7AM, please contact night-coverage [email or web address] Password [facility]  04/22/2018, 10:42 PM

## 2018-04-23 LAB — COMPREHENSIVE METABOLIC PANEL
ALT: 11 U/L (ref 0–44)
AST: 29 U/L (ref 15–41)
Albumin: 2.8 g/dL — ABNORMAL LOW (ref 3.5–5.0)
Alkaline Phosphatase: 39 U/L (ref 38–126)
Anion gap: 10 (ref 5–15)
BUN: 34 mg/dL — ABNORMAL HIGH (ref 8–23)
CO2: 20 mmol/L — ABNORMAL LOW (ref 22–32)
Calcium: 7.9 mg/dL — ABNORMAL LOW (ref 8.9–10.3)
Chloride: 108 mmol/L (ref 98–111)
Creatinine, Ser: 2.73 mg/dL — ABNORMAL HIGH (ref 0.61–1.24)
GFR calc Af Amer: 22 mL/min — ABNORMAL LOW (ref >60)
GFR calc non Af Amer: 19 mL/min — ABNORMAL LOW (ref >60)
Glucose, Bld: 119 mg/dL — ABNORMAL HIGH (ref 70–99)
Potassium: 4.4 mmol/L (ref 3.5–5.1)
Sodium: 138 mmol/L (ref 135–145)
Total Bilirubin: 0.4 mg/dL (ref 0.3–1.2)
Total Protein: 5.5 g/dL — ABNORMAL LOW (ref 6.5–8.1)

## 2018-04-23 LAB — URINALYSIS, ROUTINE W REFLEX MICROSCOPIC
Bacteria, UA: NONE SEEN
Bilirubin Urine: NEGATIVE
Glucose, UA: NEGATIVE mg/dL
Ketones, ur: NEGATIVE mg/dL
Leukocytes, UA: NEGATIVE
Nitrite: NEGATIVE
Protein, ur: NEGATIVE mg/dL
Specific Gravity, Urine: 1.012 (ref 1.005–1.030)
pH: 5 (ref 5.0–8.0)

## 2018-04-23 LAB — CBC
HCT: 30.2 % — ABNORMAL LOW (ref 39.0–52.0)
Hemoglobin: 9.3 g/dL — ABNORMAL LOW (ref 13.0–17.0)
MCH: 31 pg (ref 26.0–34.0)
MCHC: 30.8 g/dL (ref 30.0–36.0)
MCV: 100.7 fL — ABNORMAL HIGH (ref 80.0–100.0)
Platelets: 182 10*3/uL (ref 150–400)
RBC: 3 MIL/uL — ABNORMAL LOW (ref 4.22–5.81)
RDW: 13.8 % (ref 11.5–15.5)
WBC: 6.1 10*3/uL (ref 4.0–10.5)
nRBC: 0 % (ref 0.0–0.2)

## 2018-04-23 LAB — INFLUENZA PANEL BY PCR (TYPE A & B)
Influenza A By PCR: NEGATIVE
Influenza B By PCR: NEGATIVE

## 2018-04-23 LAB — STREP PNEUMONIAE URINARY ANTIGEN: Strep Pneumo Urinary Antigen: POSITIVE — AB

## 2018-04-23 LAB — MRSA PCR SCREENING: MRSA by PCR: NEGATIVE

## 2018-04-23 LAB — HIV ANTIBODY (ROUTINE TESTING W REFLEX): HIV Screen 4th Generation wRfx: NONREACTIVE

## 2018-04-23 MED ORDER — VANCOMYCIN HCL IN DEXTROSE 1-5 GM/200ML-% IV SOLN: 1000.0000 mg | INTRAVENOUS | Status: DC

## 2018-04-23 MED ORDER — SODIUM CHLORIDE 0.9 % IV SOLN
1.0000 g | INTRAVENOUS | Status: DC
  Administered 2018-04-23 – 2018-04-24 (×2): 1 g via INTRAVENOUS
  Filled 2018-04-23 (×5): qty 10

## 2018-04-23 MED ORDER — LORAZEPAM 2 MG/ML IJ SOLN
0.5000 mg | Freq: Once | INTRAMUSCULAR | Status: AC
  Administered 2018-04-23: 0.5 mg via INTRAVENOUS
  Filled 2018-04-23: qty 1

## 2018-04-23 MED ORDER — SODIUM CHLORIDE 0.9 % IV SOLN
1.0000 g | INTRAVENOUS | Status: DC
  Administered 2018-04-23: 1 g via INTRAVENOUS
  Filled 2018-04-23: qty 1

## 2018-04-23 NOTE — Progress Notes (Addendum)
Pharmacy Antibiotic Note  Arcangel Minion is a 84 y.o. male admitted on 04/22/2018 with pneumonia.  Pharmacy has been consulted for vancomycin dosing.  Plan: Vancomycin 1gm IV x 1 then 1gm IV q48 hours Cefepime dose adjusted for renal function F/u renal function, cultures and clinical course  Height: 6\' 2"  (188 cm) Weight: 180 lb (81.6 kg) IBW/kg (Calculated) : 82.2  Temp (24hrs), Avg:100.3 F (37.9 C), Min:98.3 F (36.8 C), Max:103.7 F (39.8 C)  Recent Labs  Lab 04/22/18 2009 04/22/18 2015 04/22/18 2227  WBC 3.2*  --   --   CREATININE 2.90*  --   --   LATICACIDVEN  --  2.62* 1.89    Estimated Creatinine Clearance: 18.8 mL/min (A) (by C-G formula based on SCr of 2.9 mg/dL (H)).    No Known Allergies  Thank you for allowing pharmacy to be a part of this patient's care.  Excell Seltzer Poteet 04/23/2018 12:12 AM

## 2018-04-23 NOTE — Progress Notes (Signed)
Will hold for now Foley; patient able to void. Until now, his output this shift was 1400 ml. MD aware.

## 2018-04-23 NOTE — Progress Notes (Signed)
CSW notes patient resides at [facility].   Percell Locus  LCSW [phone]

## 2018-04-23 NOTE — Evaluation (Signed)
Physical Therapy Evaluation Patient Details Name: Wayne Fuller MRN: [id number] DOB: 1923-10-15 Today's Date: 04/23/2018   History of Present Illness  Pt is a 84 y/o male who presents from ALF with fever and AMS. PMH significant for dementia, CKD, HTN, PAF, CAD. Chest x-ray concerning for PNA.   Clinical Impression  Pt admitted with above diagnosis. Pt currently with functional limitations due to the deficits listed below (see PT Problem List). At the time of PT eval pt resistant to mobility. PT eventually was able to do a gross bed level assessment as pt continued to refuse rolling or sitting up EOB. Bilateral strength and AROM in upper extremities appears good - grossly 4/5 in biceps, triceps with shoulder flexion >90. LE's at least 4-/5 in hip flexors and quads, and at least 3+/5 in hamstrings. Pt following minimal commands and LE assessment was limited. At end of session pt demonstrated the ability to adjust his positioning in the bed in order to eat lunch. Would recommend +2 assist next session for OOB mobility. Pt will benefit from skilled PT to increase their independence and safety with mobility to allow discharge to the venue listed below.       Follow Up Recommendations SNF;Supervision/Assistance - 24 hour    Equipment Recommendations  None recommended by PT(TBD by next venue of care)    Recommendations for Other Services       Precautions / Restrictions Precautions Precautions: Fall Restrictions Weight Bearing Restrictions: No      Mobility  Bed Mobility Overal bed mobility: Needs Assistance             General bed mobility comments: Pt refusing to sit up EOB, however was able to reposition himself in bed (increased time and effort required). Pt required assist to manage bed linen.   Transfers                 General transfer comment: Unable to assess  Ambulation/Gait                Stairs            Wheelchair Mobility    Modified Rankin  (Stroke Patients Only)       Balance                                             Pertinent Vitals/Pain Pain Assessment: No/denies pain    Home Living Family/patient expects to be discharged to:: Assisted living                      Prior Function Level of Independence: Needs assistance         Comments: Likely requires assistance. Pt reports he can't remember if he walks with a RW or not.      Hand Dominance        Extremity/Trunk Assessment   Upper Extremity Assessment Upper Extremity Assessment: Defer to OT evaluation;Difficult to assess due to impaired cognition    Lower Extremity Assessment Lower Extremity Assessment: Generalized weakness;Difficult to assess due to impaired cognition    Cervical / Trunk Assessment Cervical / Trunk Assessment: (Unable to assess as pt refused to sit EOB)  Communication   Communication: No difficulties  Cognition Arousal/Alertness: Awake/alert Behavior During Therapy: Restless;Impulsive Overall Cognitive Status: History of cognitive impairments - at baseline  General Comments      Exercises     Assessment/Plan    PT Assessment Patient needs continued PT services  PT Problem List Decreased strength;Decreased activity tolerance;Decreased balance;Decreased mobility;Decreased cognition;Decreased safety awareness;Decreased knowledge of use of DME;Decreased knowledge of precautions       PT Treatment Interventions DME instruction;Gait training;Functional mobility training;Therapeutic activities;Therapeutic exercise;Neuromuscular re-education;Patient/family education    PT Goals (Current goals can be found in the Care Plan section)  Acute Rehab PT Goals Patient Stated Goal: Unable to state goals at this time.  PT Goal Formulation: Patient unable to participate in goal setting Time For Goal Achievement: 05/07/18 Potential to Achieve Goals:  Fair    Frequency Min 2X/week   Barriers to discharge        Co-evaluation               AM-PAC PT "6 Clicks" Mobility  Outcome Measure Help needed turning from your back to your side while in a flat bed without using bedrails?: None Help needed moving from lying on your back to sitting on the side of a flat bed without using bedrails?: A Little Help needed moving to and from a bed to a chair (including a wheelchair)?: A Lot Help needed standing up from a chair using your arms (e.g., wheelchair or bedside chair)?: A Little Help needed to walk in hospital room?: A Lot Help needed climbing 3-5 steps with a railing? : Total 6 Click Score: 15    End of Session   Activity Tolerance: Other (comment)(Evaluation limited secondary to impaired cognition) Patient left: in bed;with call bell/phone within reach Nurse Communication: Mobility status PT Visit Diagnosis: Muscle weakness (generalized) (M62.81);Difficulty in walking, not elsewhere classified (R26.2)    Time: 7121-9758 PT Time Calculation (min) (ACUTE ONLY): 18 min   Charges:   PT Evaluation $PT Eval Moderate Complexity: 1 Mod          Rolinda Roan, PT, DPT Acute Rehabilitation Services Pager: [phone] Office: [phone]   Thelma Comp 04/23/2018, 2:39 PM

## 2018-04-23 NOTE — Evaluation (Signed)
Clinical/Bedside Swallow Evaluation Patient Details  Name: Wayne Fuller MRN: [id number] Date of Birth: 1923-12-30  Today's Date: 04/23/2018 Time: SLP Start Time (ACUTE ONLY): 1344 SLP Stop Time (ACUTE ONLY): 1417 SLP Time Calculation (min) (ACUTE ONLY): 33 min  Past Medical History:  Past Medical History:  Diagnosis Date  . BPH (benign prostatic hyperplasia)   . Chronic kidney disease    ckd stage 3  . Dementia ([place])   . GERD (gastroesophageal reflux disease)   . Hemorrhoids   . History of angina   . Hyperlipemia   . Hypertension   . Irregular heart rhythm   . Paroxysmal A-fib ([place]) 04/02/2015   Past Surgical History:  Past Surgical History:  Procedure Laterality Date  . BACK SURGERY    . CERVICAL SPINE SURGERY    . SPINE SURGERY     HPI:  Pt is a 84 y.o. male with medical history significant of dementia, chronic kidney disease, BPH, hypertension, paroxysmal atrial fibrillation, GERD and coronary artery disease who was brought in from skilled nursing facility due to agitation and altered mental status. CXR concerning for PNA. Prior BSE in 2017 showed what appeared to be normal swallow function.   Assessment / Plan / Recommendation Clinical Impression  Pt is impulsive with self-feeding and has limited mastication secondary to missing dentition, which when combined leads to large amounts of food that build up in his mouth at a time. Immediate, strong coughing occurs occasionally during these episodes, but with suspected baseline cough noted intermittently across all eating. SLP provided Max faded to Mod cues for pacing with further interventions including chopping foods into more bite-sized pieces. SLP Visit Diagnosis: Dysphagia, unspecified (R13.10)    Aspiration Risk  Moderate aspiration risk    Diet Recommendation Dysphagia 2 (Fine chop);Thin liquid   Liquid Administration via: Cup;Straw Medication Administration: Whole meds with puree Supervision: Patient able to self  feed;Full supervision/cueing for compensatory strategies Compensations: Minimize environmental distractions;Slow rate;Small sips/bites Postural Changes: Seated upright at 90 degrees;Remain upright for at least 30 minutes after po intake    Other  Recommendations Oral Care Recommendations: Oral care BID   Follow up Recommendations (tba)      Frequency and Duration min 2x/week  2 weeks       Prognosis Prognosis for Safe Diet Advancement: Fair Barriers to Reach Goals: Cognitive deficits      Swallow Study   General HPI: Pt is a 84 y.o. male with medical history significant of dementia, chronic kidney disease, BPH, hypertension, paroxysmal atrial fibrillation, GERD and coronary artery disease who was brought in from skilled nursing facility due to agitation and altered mental status. CXR concerning for PNA. Prior BSE in 2017 showed what appeared to be normal swallow function. Type of Study: Bedside Swallow Evaluation Previous Swallow Assessment: BSE in 2017 [facility] Diet Prior to this Study: Regular;Thin liquids Temperature Spikes Noted: Yes(103.7) Respiratory Status: Room air History of Recent Intubation: No Behavior/Cognition: Alert;Cooperative;Impulsive;Requires cueing Oral Care Completed by SLP: No Oral Cavity - Dentition: Missing dentition Vision: Functional for self-feeding Self-Feeding Abilities: Able to feed self;Needs set up Patient Positioning: Upright in bed Baseline Vocal Quality: Normal Volitional Cough: Strong    Oral/Motor/Sensory Function Overall Oral Motor/Sensory Function: (appears functional during meal)   Ice Chips Ice chips: Not tested   Thin Liquid Thin Liquid: Impaired Presentation: Cup;Self Fed;Straw Pharyngeal  Phase Impairments: Cough - Delayed    Nectar Thick Nectar Thick Liquid: Impaired Presentation: Cup;Self Fed Pharyngeal Phase Impairments: Cough - Delayed   Honey Thick  Honey Thick Liquid: Not tested   Puree Puree: Impaired Presentation: Self  Fed;Spoon Oral Phase Impairments: Poor awareness of bolus Oral Phase Functional Implications: Oral holding Pharyngeal Phase Impairments: Cough - Delayed   Solid     Solid: Impaired Presentation: Self Fed;Spoon Oral Phase Impairments: Poor awareness of bolus Oral Phase Functional Implications: Impaired mastication;Oral residue Pharyngeal Phase Impairments: Cough - Delayed;Cough - Immediate      Germain Osgood 04/23/2018,2:25 PM   Germain Osgood, M.A. [place] Acute Environmental education officer [phone] Office [phone]

## 2018-04-23 NOTE — Progress Notes (Signed)
Patient able to void - 550 ml; bladder scan performed and notified 62 ml urine. MD was informed and per his verbal order we will hold Foley for now. Will continue to bladder scan every shift.

## 2018-04-23 NOTE — Progress Notes (Signed)
Patient retained urine notified on call provider in and out catheter done. The output was 850 ml.

## 2018-04-23 NOTE — Progress Notes (Signed)
PROGRESS NOTE                                                                                                                                                                                                             Patient Demographics:    Wayne Fuller, is a 84 y.o. male, DOB - 1924-04-20, KKX:[id number]  Admit date - 04/22/2018   Admitting Physician Elwyn Reach, MD  Outpatient Primary MD for the patient is [name], NP  LOS - 1  Chief Complaint  Patient presents with  . Fever  . Altered Mental Status       Brief Narrative  Wayne Fuller is a 84 y.o. male with medical history significant of dementia, chronic kidney disease, BPH, hypertension, paroxysmal atrial fibrillation, GERD and coronary artery disease who was brought in from skilled nursing facility due to agitation and altered mental status, history of urinary retention and pneumonia and he was admitted to the hospital.   Subjective:    Wayne Fuller today due to advanced dementia unable to answer questions or cooperate in exam.   Assessment  & Plan :     1.  Toxic encephalopathy in a patient with advanced dementia likely due to combination of pneumonia and urinary retention.  Foley, Flomax, IV Rocephin, strep pneumoniae urinary antigen is positive, continue supportive care and monitor.  Try to avoid narcotics and benzodiazepines.  Will request speech and PT to get involved.  2.  Urinary retention.  Foley and Flomax.  Have underlying history of BPH.  3.  CAD.  No acute issues currently on combination of beta-blocker and aspirin.  4.  Paroxysmal atrial fibrillation with [place] vas 2 score of at least 3.  Currently on combination of amiodarone, beta-blocker and aspirin.  Not on anticoagulation likely due to fall risk.  5.  GERD.  On PPI.  6.  Hypertension.  On combination of hydralazine, beta-blocker and Norvasc.  Continue to monitor.  7.  CKD 5.  Baseline creatinine close to 2.5.  Currently at  baseline will monitor.    Family Communication  :  message left for son Wayne Fuller 04/23/2018 at 10:40 AM  Code Status : Code however there is documentation of DNR order being placed on 10/27/2017 in his chart.  Disposition Plan  : SNF  Consults  : None  Procedures  :      DVT Prophylaxis  :  Lovenox    Lab Results  Component Value Date   PLT 182  04/23/2018    Diet :  Diet Order            Diet Heart Room service appropriate? Yes; Fluid consistency: Thin  Diet effective now               Inpatient Medications Scheduled Meds: . amiodarone  200 mg Oral Daily  . amLODipine  10 mg Oral Daily  . aspirin EC  81 mg Oral Daily  . enoxaparin (LOVENOX) injection  40 mg Subcutaneous Q24H  . ferrous sulfate  325 mg Oral Daily  . hydrALAZINE  100 mg Oral TID  . Melatonin  3 mg Oral QHS  . memantine  5 mg Oral Daily  . metoprolol succinate  25 mg Oral Daily  . pantoprazole  40 mg Oral Daily  . sodium bicarbonate  650 mg Oral BID  . tamsulosin  0.4 mg Oral QPM  . cyanocobalamin  1,000 mcg Oral Daily   Continuous Infusions: . sodium chloride 75 mL/hr at 04/23/18 0047  . cefTRIAXone (ROCEPHIN)  IV     PRN Meds:.acetaminophen, alum & mag hydroxide-simeth, guaiFENesin, loperamide, LORazepam, magnesium hydroxide, neomycin-bacitracin-polymyxin, traMADol  Antibiotics  :   Anti-infectives (From admission, onward)   Start     Dose/Rate Route Frequency Ordered Stop   04/25/18 0600  vancomycin (VANCOCIN) IVPB 1000 mg/200 mL premix  Status:  Discontinued     1,000 mg 200 mL/hr over 60 Minutes Intravenous Every 48 hours 04/23/18 0020 04/23/18 1037   04/23/18 1045  cefTRIAXone (ROCEPHIN) 1 g in sodium chloride 0.9 % 100 mL IVPB     1 g 200 mL/hr over 30 Minutes Intravenous Every 24 hours 04/23/18 1037 04/27/18 1044   04/23/18 0015  ceFEPIme (MAXIPIME) 1 g in sodium chloride 0.9 % 100 mL IVPB  Status:  Discontinued     1 g 200 mL/hr over 30 Minutes Intravenous Every 24 hours  04/23/18 0000 04/23/18 1037   04/23/18 0000  ceFEPIme (MAXIPIME) 1 g in sodium chloride 0.9 % 100 mL IVPB  Status:  Discontinued     1 g 200 mL/hr over 30 Minutes Intravenous Every 8 hours 04/22/18 2352 04/23/18 0000   04/22/18 2300  vancomycin (VANCOCIN) IVPB 1000 mg/200 mL premix     1,000 mg 200 mL/hr over 60 Minutes Intravenous  Once 04/22/18 2248 04/23/18 0240   04/22/18 2030  azithromycin (ZITHROMAX) 500 mg in sodium chloride 0.9 % 250 mL IVPB  Status:  Discontinued     500 mg 250 mL/hr over 60 Minutes Intravenous Every 24 hours 04/22/18 2024 04/23/18 1037   04/22/18 2030  cefTRIAXone (ROCEPHIN) 1 g in sodium chloride 0.9 % 100 mL IVPB     1 g 200 mL/hr over 30 Minutes Intravenous  Once 04/22/18 2024 04/22/18 2138   04/22/18 2015  cefTRIAXone (ROCEPHIN) 1 g in sodium chloride 0.9 % 100 mL IVPB  Status:  Discontinued     1 g 200 mL/hr over 30 Minutes Intravenous Every 24 hours 04/22/18 2004 04/22/18 2358          Objective:   Vitals:   04/22/18 2230 04/22/18 2238 04/22/18 2315 04/23/18 0548  BP: 135/66  (!) 151/66 (!) 152/66  Pulse: 80  78 67  Resp: (!) 21     Temp:  98.8 F (37.1 C) 98.3 F (36.8 C) 98.8 F (37.1 C)  TempSrc:  Oral Oral Oral  SpO2: 96%  100% 97%  Weight:   81.6 kg   Height:   6\' 2"  (1.88 m)  Wt Readings from Last 3 Encounters:  04/22/18 81.6 kg  10/24/17 72.1 kg  09/12/17 72.1 kg     Intake/Output Summary (Last 24 hours) at 04/23/2018 1039 Last data filed at 04/23/2018 0954 Gross per 24 hour  Intake 1190 ml  Output 850 ml  Net 340 ml     Physical Exam  Awake, but confused, moves all 4 extremities, wearing mittens, [place].AT,PERRAL Supple Neck,No JVD, No cervical lymphadenopathy appriciated.  Symmetrical Chest wall movement, Good air movement bilaterally, CTAB RRR,No Gallops,Rubs or new Murmurs, No Parasternal Heave +ve B.Sounds, Abd Soft, No tenderness, No organomegaly appriciated, No rebound - guarding or rigidity. No Cyanosis,  Clubbing or edema, No new Rash or bruise       Data Review:    CBC Recent Labs  Lab 04/22/18 2009 04/23/18 0317  WBC 3.2* 6.1  HGB 10.1* 9.3*  HCT 32.9* 30.2*  PLT 188 182  MCV 100.6* 100.7*  MCH 30.9 31.0  MCHC 30.7 30.8  RDW 13.6 13.8  LYMPHSABS 0.4*  --   MONOABS 0.2  --   EOSABS 0.0  --   BASOSABS 0.0  --     Chemistries  Recent Labs  Lab 04/22/18 2009 04/23/18 0317  NA 135 138  K 4.1 4.4  CL 104 108  CO2 19* 20*  GLUCOSE 53* 119*  BUN 38* 34*  CREATININE 2.90* 2.73*  CALCIUM 8.1* 7.9*  AST 20 29  ALT 10 11  ALKPHOS 49 39  BILITOT 0.5 0.4   ------------------------------------------------------------------------------------------------------------------ No results for input(s): CHOL, HDL, LDLCALC, TRIG, CHOLHDL, LDLDIRECT in the last 72 hours.  No results found for: HGBA1C ------------------------------------------------------------------------------------------------------------------ No results for input(s): TSH, T4TOTAL, T3FREE, THYROIDAB in the last 72 hours.  Invalid input(s): FREET3 ------------------------------------------------------------------------------------------------------------------ No results for input(s): VITAMINB12, FOLATE, FERRITIN, TIBC, IRON, RETICCTPCT in the last 72 hours.  Coagulation profile No results for input(s): INR, PROTIME in the last 168 hours.  No results for input(s): DDIMER in the last 72 hours.  Cardiac Enzymes Recent Labs  Lab 04/22/18 2009  TROPONINI 0.05*   ------------------------------------------------------------------------------------------------------------------    Component Value Date/Time   BNP 129.3 (H) 04/02/2015 1440    Micro Results Recent Results (from the past 240 hour(s))  MRSA PCR Screening     Status: None   Collection Time: 04/23/18 12:01 AM  Result Value Ref Range Status   MRSA by PCR NEGATIVE NEGATIVE Final    Comment:        The GeneXpert MRSA Assay (FDA approved for  NASAL specimens only), is one component of a comprehensive MRSA colonization surveillance program. It is not intended to diagnose MRSA infection nor to guide or monitor treatment for MRSA infections. Performed at [facility], [place] [address], [place], [place] [address]     Radiology Reports Dg Chest Port 1 View  Result Date: 04/22/2018 CLINICAL DATA:  Altered mental status and fever. EXAM: PORTABLE CHEST 1 VIEW COMPARISON:  09/11/2016 FINDINGS: Lungs are adequately inflated demonstrate patchy hazy bilateral airspace process most notable over the left base and right mid to upper lung. No definite effusion. Cardiomediastinal silhouette and remainder of the exam is unchanged. IMPRESSION: Hazy bilateral multifocal airspace process likely infection. Electronically Signed   By: Marin Olp M.D.   On: 04/22/2018 20:16    Time Spent in minutes  30   Lala Lund M.D on 04/23/2018 at 10:39 AM  To page go to [email or web address] - password [facility]

## 2018-04-24 LAB — CBC
HCT: 27.8 % — ABNORMAL LOW (ref 39.0–52.0)
Hemoglobin: 8.7 g/dL — ABNORMAL LOW (ref 13.0–17.0)
MCH: 31.3 pg (ref 26.0–34.0)
MCHC: 31.3 g/dL (ref 30.0–36.0)
MCV: 100 fL (ref 80.0–100.0)
Platelets: 161 10*3/uL (ref 150–400)
RBC: 2.78 MIL/uL — ABNORMAL LOW (ref 4.22–5.81)
RDW: 14.1 % (ref 11.5–15.5)
WBC: 6.5 10*3/uL (ref 4.0–10.5)
nRBC: 0 % (ref 0.0–0.2)

## 2018-04-24 LAB — URINE CULTURE: Culture: NO GROWTH

## 2018-04-24 LAB — BASIC METABOLIC PANEL
Anion gap: 6 (ref 5–15)
BUN: 32 mg/dL — ABNORMAL HIGH (ref 8–23)
CO2: 21 mmol/L — ABNORMAL LOW (ref 22–32)
Calcium: 7.7 mg/dL — ABNORMAL LOW (ref 8.9–10.3)
Chloride: 113 mmol/L — ABNORMAL HIGH (ref 98–111)
Creatinine, Ser: 2.44 mg/dL — ABNORMAL HIGH (ref 0.61–1.24)
GFR calc Af Amer: 26 mL/min — ABNORMAL LOW (ref >60)
GFR calc non Af Amer: 22 mL/min — ABNORMAL LOW (ref >60)
Glucose, Bld: 100 mg/dL — ABNORMAL HIGH (ref 70–99)
Potassium: 4.1 mmol/L (ref 3.5–5.1)
Sodium: 140 mmol/L (ref 135–145)

## 2018-04-24 LAB — MAGNESIUM: Magnesium: 1.8 mg/dL (ref 1.7–2.4)

## 2018-04-24 MED ORDER — ENOXAPARIN SODIUM 30 MG/0.3ML ~~LOC~~ SOLN
30.0000 mg | SUBCUTANEOUS | Status: DC
  Administered 2018-04-24 – 2018-04-28 (×4): 30 mg via SUBCUTANEOUS
  Filled 2018-04-24 (×4): qty 0.3

## 2018-04-24 NOTE — Progress Notes (Signed)
Family member in room with patient at beginning of shift. As soon as family member left patient became agitated and attempting to climb out of bed, unable to calm down, prn ativan given not effective, food and drink offered to patient not effective, order obtained for one time dose ativan which was given patient went to sleep for couple of hours.  Patient woke up with  agitation and combative, patient was toileted on [facility],  food and drink offered and prn medication given. Patient able to sleep after medication.  Will continue to monitor.

## 2018-04-24 NOTE — Progress Notes (Signed)
  Speech Language Pathology Treatment: Dysphagia  Patient Details Name: Wayne Fuller MRN: [id number] DOB: [birth year] Today's Date: 04/24/2018 Time: 1015-1024 SLP Time Calculation (min) (ACUTE ONLY): 9 min  Assessment / Plan / Recommendation Clinical Impression  Upon SLP arrival, pt was through most of his breakfast tray, with large amounts of food sitting in his mouth. SLP provided Mod cues to cease intake, finish mastication, and swallow. He then continued to finish his meal with Min-Mod cues needed for impulsivity. No coughing was observed throughout trials today. He does have frequent eructation, which may suggest an esophageal component, particularly given h/o GERD. Would continue current diet but still recommend FULL supervision for use of safe swallowing precautions.   HPI HPI: Pt is a 84 y.o. male with medical history significant of dementia, chronic kidney disease, BPH, hypertension, paroxysmal atrial fibrillation, GERD and coronary artery disease who was brought in from skilled nursing facility due to agitation and altered mental status. CXR concerning for PNA. Prior BSE in 2017 showed what appeared to be normal swallow function.      SLP Plan  Continue with current plan of care       Recommendations  Diet recommendations: Dysphagia 2 (fine chop);Thin liquid Liquids provided via: Cup;Straw Medication Administration: Whole meds with puree Supervision: Patient able to self feed;Full supervision/cueing for compensatory strategies Compensations: Minimize environmental distractions;Slow rate;Small sips/bites Postural Changes and/or Swallow Maneuvers: Seated upright 90 degrees;Upright 30-60 min after meal                Oral Care Recommendations: Oral care BID Follow up Recommendations: 24 hour supervision/assistance SLP Visit Diagnosis: Dysphagia, unspecified (R13.10) Plan: Continue with current plan of care       GO                Germain Osgood 04/24/2018,  11:44 AM  Germain Osgood, M.A. [place] [facility] Environmental education officer [phone] Office [phone]

## 2018-04-24 NOTE — Progress Notes (Signed)
PROGRESS NOTE                                                                                                                                                                                                             Patient Demographics:    Wayne Fuller, is a 84 y.o. male, DOB - 1923-07-11, WIO:[id number]  Admit date - 04/22/2018   Admitting Physician Elwyn Reach, MD  Outpatient Primary MD for the patient is [name], NP  LOS - 2  Chief Complaint  Patient presents with  . Fever  . Altered Mental Status       Brief Narrative  Wayne Fuller is a 84 y.o. male with medical history significant of dementia, chronic kidney disease, BPH, hypertension, paroxysmal atrial fibrillation, GERD and coronary artery disease who was brought in from skilled nursing facility due to agitation and altered mental status, history of urinary retention and pneumonia and he was admitted to the hospital.   Subjective:    Wayne Fuller mains in bed today however he is more awake and alert as compared to yesterday, poor historian due to underlying dementia but denies any subjective complaints.   Assessment  & Plan :     1.  Toxic encephalopathy in a patient with advanced dementia likely due to combination of pneumonia and urinary retention.  He was treated supportively with Flomax, IV Rocephin and IV fluids for hydration, his urinary retention has resolved and he is emptying his bladder, bladder scans being done every shift, on IV Rocephin for pneumonia, mentation has improved he likely is close to his baseline.  Likely discharge in the next 1 to 2 days.  2.  Urinary retention.  Continue Flomax.  Have underlying history of BPH.  Monitor post void bladder scans.  No signs of retention now.  3.  Strep pneumonia pneumonia.  On Rocephin.  Clinically improving.  Continue to monitor.    4.  Paroxysmal atrial fibrillation with [place] vas 2 score of at least 3.  Currently on combination of  amiodarone, beta-blocker and aspirin.  Not on anticoagulation likely due to fall risk.  5.  GERD.  On PPI.  6.  Hypertension.  On combination of hydralazine, beta-blocker and Norvasc.  Continue to monitor.  7.  CKD 5.  Baseline creatinine close to 2.5.  Currently at baseline will monitor.  8. CAD.  No acute issues currently on combination of beta-blocker and aspirin.  9.  Dysphagia.  Speech following currently on dysphagia 2  diet with nectar thick liquids.  Monitor.    Family Communication  :  message left for son [name] 04/23/2018 at 10:40 AM  Code Status : Full code however there is documentation of DNR order being placed on 10/27/2017 in his chart.  No family available to confirm.  Message left for son.  Disposition Plan  : [facility] bed, discharge to SNF in the next 1 to 2 days if continues to improve.  Consults  : None  Procedures  :      DVT Prophylaxis  :  Lovenox    Lab Results  Component Value Date   PLT 161 04/24/2018    Diet :  Diet Order            DIET DYS 2 Room service appropriate? Yes; Fluid consistency: Thin  Diet effective now               Inpatient Medications Scheduled Meds: . amiodarone  200 mg Oral Daily  . amLODipine  10 mg Oral Daily  . aspirin EC  81 mg Oral Daily  . [START ON 04/25/2018] enoxaparin (LOVENOX) injection  30 mg Subcutaneous Q24H  . ferrous sulfate  325 mg Oral Daily  . hydrALAZINE  100 mg Oral TID  . Melatonin  3 mg Oral QHS  . memantine  5 mg Oral Daily  . metoprolol succinate  25 mg Oral Daily  . pantoprazole  40 mg Oral Daily  . sodium bicarbonate  650 mg Oral BID  . tamsulosin  0.4 mg Oral QPM  . cyanocobalamin  1,000 mcg Oral Daily   Continuous Infusions: . cefTRIAXone (ROCEPHIN)  IV 1 g (04/24/18 1003)   PRN Meds:.acetaminophen, alum & mag hydroxide-simeth, guaiFENesin, loperamide, LORazepam, magnesium hydroxide, neomycin-bacitracin-polymyxin, traMADol  Antibiotics  :   Anti-infectives (From admission,  onward)   Start     Dose/Rate Route Frequency Ordered Stop   04/25/18 0600  vancomycin (VANCOCIN) IVPB 1000 mg/200 mL premix  Status:  Discontinued     1,000 mg 200 mL/hr over 60 Minutes Intravenous Every 48 hours 04/23/18 0020 04/23/18 1037   04/23/18 1045  cefTRIAXone (ROCEPHIN) 1 g in sodium chloride 0.9 % 100 mL IVPB     1 g 200 mL/hr over 30 Minutes Intravenous Every 24 hours 04/23/18 1037 04/27/18 1044   04/23/18 0015  ceFEPIme (MAXIPIME) 1 g in sodium chloride 0.9 % 100 mL IVPB  Status:  Discontinued     1 g 200 mL/hr over 30 Minutes Intravenous Every 24 hours 04/23/18 0000 04/23/18 1037   04/23/18 0000  ceFEPIme (MAXIPIME) 1 g in sodium chloride 0.9 % 100 mL IVPB  Status:  Discontinued     1 g 200 mL/hr over 30 Minutes Intravenous Every 8 hours 04/22/18 2352 04/23/18 0000   04/22/18 2300  vancomycin (VANCOCIN) IVPB 1000 mg/200 mL premix     1,000 mg 200 mL/hr over 60 Minutes Intravenous  Once 04/22/18 2248 04/23/18 0240   04/22/18 2030  azithromycin (ZITHROMAX) 500 mg in sodium chloride 0.9 % 250 mL IVPB  Status:  Discontinued     500 mg 250 mL/hr over 60 Minutes Intravenous Every 24 hours 04/22/18 2024 04/23/18 1037   04/22/18 2030  cefTRIAXone (ROCEPHIN) 1 g in sodium chloride 0.9 % 100 mL IVPB     1 g 200 mL/hr over 30 Minutes Intravenous  Once 04/22/18 2024 04/22/18 2138   04/22/18 2015  cefTRIAXone (ROCEPHIN) 1 g in sodium chloride 0.9 % 100 mL IVPB  Status:  Discontinued     1 g 200 mL/hr over 30 Minutes Intravenous Every 24 hours 04/22/18 2004 04/22/18 2358          Objective:   Vitals:   04/23/18 2133 04/24/18 0529 04/24/18 0947 04/24/18 0949  BP: (!) 140/59 (!) 168/53 (!) 148/79   Pulse: 70 68  64  Resp: 18 18    Temp: 99.1 F (37.3 C) 98 F (36.7 C)    TempSrc: Oral Oral    SpO2: 96% 98%    Weight:      Height:        Wt Readings from Last 3 Encounters:  04/22/18 81.6 kg  10/24/17 72.1 kg  09/12/17 72.1 kg     Intake/Output Summary (Last 24  hours) at 04/24/2018 1149 Last data filed at 04/24/2018 1100 Gross per 24 hour  Intake 2051.07 ml  Output 5200 ml  Net -3148.93 ml     Physical Exam  Awake more arousable today but overall still confused, likely his baseline [place].AT,PERRAL Supple Neck,No JVD, No cervical lymphadenopathy appriciated.  Symmetrical Chest wall movement, Good air movement bilaterally, few coarse breath sounds bibasilar RRR,No Gallops, Rubs or new Murmurs, No Parasternal Heave +ve B.Sounds, Abd Soft, No tenderness, No organomegaly appriciated, No rebound - guarding or rigidity. No Cyanosis, Clubbing or edema, No new Rash or bruise    Data Review:    CBC Recent Labs  Lab 04/22/18 2009 04/23/18 0317 04/24/18 0310  WBC 3.2* 6.1 6.5  HGB 10.1* 9.3* 8.7*  HCT 32.9* 30.2* 27.8*  PLT 188 182 161  MCV 100.6* 100.7* 100.0  MCH 30.9 31.0 31.3  MCHC 30.7 30.8 31.3  RDW 13.6 13.8 14.1  LYMPHSABS 0.4*  --   --   MONOABS 0.2  --   --   EOSABS 0.0  --   --   BASOSABS 0.0  --   --     Chemistries  Recent Labs  Lab 04/22/18 2009 04/23/18 0317 04/24/18 0310  NA 135 138 140  K 4.1 4.4 4.1  CL 104 108 113*  CO2 19* 20* 21*  GLUCOSE 53* 119* 100*  BUN 38* 34* 32*  CREATININE 2.90* 2.73* 2.44*  CALCIUM 8.1* 7.9* 7.7*  MG  --   --  1.8  AST 20 29  --   ALT 10 11  --   ALKPHOS 49 39  --   BILITOT 0.5 0.4  --    ------------------------------------------------------------------------------------------------------------------ No results for input(s): CHOL, HDL, LDLCALC, TRIG, CHOLHDL, LDLDIRECT in the last 72 hours.  No results found for: HGBA1C ------------------------------------------------------------------------------------------------------------------ No results for input(s): TSH, T4TOTAL, T3FREE, THYROIDAB in the last 72 hours.  Invalid input(s): FREET3 ------------------------------------------------------------------------------------------------------------------ No results for  input(s): VITAMINB12, FOLATE, FERRITIN, TIBC, IRON, RETICCTPCT in the last 72 hours.  Coagulation profile No results for input(s): INR, PROTIME in the last 168 hours.  No results for input(s): DDIMER in the last 72 hours.  Cardiac Enzymes Recent Labs  Lab 04/22/18 2009  TROPONINI 0.05*   ------------------------------------------------------------------------------------------------------------------    Component Value Date/Time   BNP 129.3 (H) 04/02/2015 1440    Micro Results Recent Results (from the past 240 hour(s))  MRSA PCR Screening     Status: None   Collection Time: 04/23/18 12:01 AM  Result Value Ref Range Status   MRSA by PCR NEGATIVE NEGATIVE Final    Comment:        The GeneXpert MRSA Assay (FDA approved for NASAL specimens only), is one component of a comprehensive MRSA  colonization surveillance program. It is not intended to diagnose MRSA infection nor to guide or monitor treatment for MRSA infections. Performed at [facility], [place] [address], [place], [place] [address]   Urine culture     Status: None   Collection Time: 04/23/18  4:34 AM  Result Value Ref Range Status   Specimen Description URINE, CATHETERIZED  Final   Special Requests NONE  Final   Culture   Final    NO GROWTH Performed at [facility], [address], [place], [place] [address]    Report Status 04/24/2018 FINAL  Final    Radiology Reports Dg Chest Port 1 View  Result Date: 04/22/2018 CLINICAL DATA:  Altered mental status and fever. EXAM: PORTABLE CHEST 1 VIEW COMPARISON:  09/11/2016 FINDINGS: Lungs are adequately inflated demonstrate patchy hazy bilateral airspace process most notable over the left base and right mid to upper lung. No definite effusion. Cardiomediastinal silhouette and remainder of the exam is unchanged. IMPRESSION: Hazy bilateral multifocal airspace process likely infection. Electronically Signed   By: Marin Olp M.D.   On: 04/22/2018 20:16     Time Spent in minutes  30   Lala Lund M.D on 04/24/2018 at 11:49 AM  To page go to [email or web address] - password [facility]

## 2018-04-24 NOTE — Progress Notes (Signed)
This RN agrees with previous RN's assessment of pt.

## 2018-04-25 ENCOUNTER — Ambulatory Visit: Payer: Medicare Other | Admitting: Nurse Practitioner

## 2018-04-25 ENCOUNTER — Ambulatory Visit: Payer: Medicare Other | Admitting: Internal Medicine

## 2018-04-25 DIAGNOSIS — I25119 Atherosclerotic heart disease of native coronary artery with unspecified angina pectoris: Secondary | ICD-10-CM

## 2018-04-25 DIAGNOSIS — J189 Pneumonia, unspecified organism: Secondary | ICD-10-CM

## 2018-04-25 DIAGNOSIS — N184 Chronic kidney disease, stage 4 (severe): Secondary | ICD-10-CM

## 2018-04-25 DIAGNOSIS — I10 Essential (primary) hypertension: Secondary | ICD-10-CM

## 2018-04-25 LAB — BASIC METABOLIC PANEL
Anion gap: 11 (ref 5–15)
BUN: 31 mg/dL — ABNORMAL HIGH (ref 8–23)
CO2: 19 mmol/L — ABNORMAL LOW (ref 22–32)
Calcium: 8.4 mg/dL — ABNORMAL LOW (ref 8.9–10.3)
Chloride: 112 mmol/L — ABNORMAL HIGH (ref 98–111)
Creatinine, Ser: 2.34 mg/dL — ABNORMAL HIGH (ref 0.61–1.24)
GFR calc Af Amer: 27 mL/min — ABNORMAL LOW (ref >60)
GFR calc non Af Amer: 23 mL/min — ABNORMAL LOW (ref >60)
Glucose, Bld: 126 mg/dL — ABNORMAL HIGH (ref 70–99)
Potassium: 4.5 mmol/L (ref 3.5–5.1)
Sodium: 142 mmol/L (ref 135–145)

## 2018-04-25 MED ORDER — HALOPERIDOL LACTATE 5 MG/ML IJ SOLN
2.0000 mg | Freq: Four times a day (QID) | INTRAMUSCULAR | Status: DC | PRN
  Administered 2018-04-25 (×2): 2 mg via INTRAMUSCULAR
  Filled 2018-04-25 (×2): qty 1

## 2018-04-25 MED ORDER — AMOXICILLIN-POT CLAVULANATE 500-125 MG PO TABS
500.0000 mg | ORAL_TABLET | Freq: Two times a day (BID) | ORAL | Status: DC
  Administered 2018-04-25 – 2018-04-28 (×7): 500 mg via ORAL
  Filled 2018-04-25 (×7): qty 1

## 2018-04-25 NOTE — Progress Notes (Signed)
Boykin Peek RN

## 2018-04-25 NOTE — Progress Notes (Addendum)
Pt is anxious and trying to get up from recliner at nurses station Administered 0.5mg  Ativan PO PRN. Will continue to monitor.

## 2018-04-25 NOTE — Progress Notes (Signed)
Pt brought to nurse's station in recliner for safety due to pt continuing to get out of bed and being agitated. Will continue to monitor pt. Ranelle Oyster, RN

## 2018-04-25 NOTE — Progress Notes (Signed)
Patient's son returned call and requested that his sister be the one to choose the SNF since she is local. Patient's daughter then returned the call and expressed frustration that she has to be the one to make the decision since she works. She will speak with her family and call CSW back with choice.   [name] LCSW [phone]

## 2018-04-25 NOTE — Progress Notes (Signed)
Administered 2 mg Haldol IV per MD order. Returned pt to bed via 2 assist. Reduced overstimulation in the room. Bed in low position, bed alarm on, call bell within reach. Reinforced teaching for pt to call whenever needed. Will continue to monitor.

## 2018-04-25 NOTE — Progress Notes (Signed)
PROGRESS NOTE        PATIENT DETAILS Name: Wayne Fuller Age: 84 y.o. Sex: male Date of Birth: 1924/03/15 Admit Date: 04/22/2018 Admitting Physician Elwyn Reach, MD [facility]:[id number], [name], NP  Brief Narrative: Patient is a 84 y.o. male with history of dementia, CKD stage IV, CAD, PAF not on anticoagulation-presenting with acute encephalopathy in the setting of probable aspiration pneumonia.  See below for further details  Subjective: Pleasantly confused but easily redirectable-follows commands.  Denies any chest pain or shortness of breath.  Assessment/Plan: Acute metabolic encephalopathy: Suspect secondary to pneumonia in a background of dementia.  Pleasantly confused-suspect he is not that far from his usual baseline.  Expect some amount of delirium in the setting of dementia while hospitalized  Pneumonia: Suspect aspiration-however urine streptococcal antigen positive-we will transition to Augmentin for a few more days.  Overall improved-afebrile and without any leukocytosis.  Acute urinary retention: Seems to have improved with Flomax-prior MD on 12/3 briefly contemplated placing a Foley catheter-however patient spontaneously voided.  Continue close monitoring.  Dysphagia: Suspect this is mostly chronic issue-evaluated by speech therapy and started on a dysphagia 2 diet.  CKD stage IV: Creatinine close to usual baseline-follow electrolytes periodically.  PAF: Continue amiodarone, beta-blocker-not on anticoagulation-presumably secondary to frailty, advanced age and fall risk.  Hypertension: Controlled continue amlodipine, metoprolol.  Follow.  Dementia: Appears pleasantly confused this morning-suspect he is not very far from his baseline-no family at bedside.  Continue Namenda.  DVT Prophylaxis: Prophylactic Lovenox  Code Status: Full code   Family Communication: None at [place] message for patient's son  Disposition Plan: Remain  inpatient- SNF on discharge-when bed available  Antimicrobial agents: Anti-infectives (From admission, onward)   Start     Dose/Rate Route Frequency Ordered Stop   04/25/18 1000  amoxicillin-clavulanate (AUGMENTIN) 500-125 MG per tablet 500 mg     500 mg Oral Every 12 hours 04/25/18 0858 04/28/18 2359   04/25/18 0600  vancomycin (VANCOCIN) IVPB 1000 mg/200 mL premix  Status:  Discontinued     1,000 mg 200 mL/hr over 60 Minutes Intravenous Every 48 hours 04/23/18 0020 04/23/18 1037   04/23/18 1045  cefTRIAXone (ROCEPHIN) 1 g in sodium chloride 0.9 % 100 mL IVPB  Status:  Discontinued     1 g 200 mL/hr over 30 Minutes Intravenous Every 24 hours 04/23/18 1037 04/25/18 0858   04/23/18 0015  ceFEPIme (MAXIPIME) 1 g in sodium chloride 0.9 % 100 mL IVPB  Status:  Discontinued     1 g 200 mL/hr over 30 Minutes Intravenous Every 24 hours 04/23/18 0000 04/23/18 1037   04/23/18 0000  ceFEPIme (MAXIPIME) 1 g in sodium chloride 0.9 % 100 mL IVPB  Status:  Discontinued     1 g 200 mL/hr over 30 Minutes Intravenous Every 8 hours 04/22/18 2352 04/23/18 0000   04/22/18 2300  vancomycin (VANCOCIN) IVPB 1000 mg/200 mL premix     1,000 mg 200 mL/hr over 60 Minutes Intravenous  Once 04/22/18 2248 04/23/18 0240   04/22/18 2030  azithromycin (ZITHROMAX) 500 mg in sodium chloride 0.9 % 250 mL IVPB  Status:  Discontinued     500 mg 250 mL/hr over 60 Minutes Intravenous Every 24 hours 04/22/18 2024 04/23/18 1037   04/22/18 2030  cefTRIAXone (ROCEPHIN) 1 g in sodium chloride 0.9 % 100 mL IVPB     1  g 200 mL/hr over 30 Minutes Intravenous  Once 04/22/18 2024 04/22/18 2138   04/22/18 2015  cefTRIAXone (ROCEPHIN) 1 g in sodium chloride 0.9 % 100 mL IVPB  Status:  Discontinued     1 g 200 mL/hr over 30 Minutes Intravenous Every 24 hours 04/22/18 2004 04/22/18 2358      Procedures: None  CONSULTS:  None  Time spent: 25- minutes-Greater than 50% of this time was spent in counseling, explanation of diagnosis,  planning of further management, and coordination of care.  MEDICATIONS: Scheduled Meds: . amiodarone  200 mg Oral Daily  . amLODipine  10 mg Oral Daily  . amoxicillin-clavulanate  500 mg Oral Q12H  . aspirin EC  81 mg Oral Daily  . enoxaparin (LOVENOX) injection  30 mg Subcutaneous Q24H  . ferrous sulfate  325 mg Oral Daily  . hydrALAZINE  100 mg Oral TID  . Melatonin  3 mg Oral QHS  . memantine  5 mg Oral Daily  . metoprolol succinate  25 mg Oral Daily  . pantoprazole  40 mg Oral Daily  . sodium bicarbonate  650 mg Oral BID  . tamsulosin  0.4 mg Oral QPM  . cyanocobalamin  1,000 mcg Oral Daily   Continuous Infusions: PRN Meds:.acetaminophen, alum & mag hydroxide-simeth, guaiFENesin, loperamide, LORazepam, magnesium hydroxide, neomycin-bacitracin-polymyxin, traMADol   PHYSICAL EXAM: Vital signs: Vitals:   04/24/18 2137 04/25/18 0511 04/25/18 0922 04/25/18 1123  BP: (!) 158/58 (!) 169/59 (!) 158/71 (!) 145/78  Pulse: 67 73 77 75  Resp: 16 16 16    Temp: 99.2 F (37.3 C) 99 F (37.2 C) 98.2 F (36.8 C)   TempSrc: Oral Oral Axillary   SpO2: 100% 97% 99% 95%  Weight:      Height:       Filed Weights   04/22/18 2315  Weight: 81.6 kg   Body mass index is 23.11 kg/m.   General appearance :Awake, pleasantly confused, not in any distress.  HEENT: Atraumatic and Normocephalic Neck: supple Resp:Good air entry bilaterally, no added sounds  CVS: S1 S2 regular, no murmurs.  GI: Bowel sounds present, Non tender and not distended with no gaurding, rigidity or rebound.No organomegaly Extremities: B/L Lower Ext shows no edema, both legs are warm to touch Neurology: Generalized weakness-but nonfocal Musculoskeletal:No digital cyanosis Skin:No Rash, warm and dry Wounds:N/A  I have personally reviewed following labs and imaging studies  LABORATORY DATA: CBC: Recent Labs  Lab 04/22/18 2009 04/23/18 0317 04/24/18 0310  WBC 3.2* 6.1 6.5  NEUTROABS 2.6  --   --   HGB 10.1*  9.3* 8.7*  HCT 32.9* 30.2* 27.8*  MCV 100.6* 100.7* 100.0  PLT 188 182 867    Basic Metabolic Panel: Recent Labs  Lab 04/22/18 2009 04/23/18 0317 04/24/18 0310 04/25/18 0347  NA 135 138 140 142  K 4.1 4.4 4.1 4.5  CL 104 108 113* 112*  CO2 19* 20* 21* 19*  GLUCOSE 53* 119* 100* 126*  BUN 38* 34* 32* 31*  CREATININE 2.90* 2.73* 2.44* 2.34*  CALCIUM 8.1* 7.9* 7.7* 8.4*  MG  --   --  1.8  --     GFR: Estimated Creatinine Clearance: 23.2 mL/min (A) (by C-G formula based on SCr of 2.34 mg/dL (H)).  Liver Function Tests: Recent Labs  Lab 04/22/18 2009 04/23/18 0317  AST 20 29  ALT 10 11  ALKPHOS 49 39  BILITOT 0.5 0.4  PROT 6.5 5.5*  ALBUMIN 3.5 2.8*   No results for input(s):  LIPASE, AMYLASE in the last 168 hours. No results for input(s): AMMONIA in the last 168 hours.  Coagulation Profile: No results for input(s): INR, PROTIME in the last 168 hours.  Cardiac Enzymes: Recent Labs  Lab 04/22/18 2009  TROPONINI 0.05*    BNP (last 3 results) No results for input(s): PROBNP in the last 8760 hours.  HbA1C: No results for input(s): HGBA1C in the last 72 hours.  CBG: Recent Labs  Lab 04/22/18 2010  GLUCAP 56*    Lipid Profile: No results for input(s): CHOL, HDL, LDLCALC, TRIG, CHOLHDL, LDLDIRECT in the last 72 hours.  Thyroid Function Tests: No results for input(s): TSH, T4TOTAL, FREET4, T3FREE, THYROIDAB in the last 72 hours.  Anemia Panel: No results for input(s): VITAMINB12, FOLATE, FERRITIN, TIBC, IRON, RETICCTPCT in the last 72 hours.  Urine analysis:    Component Value Date/Time   COLORURINE YELLOW 04/23/2018 [place] 04/23/2018 0434   LABSPEC 1.012 04/23/2018 0434   PHURINE 5.0 04/23/2018 0434   GLUCOSEU NEGATIVE 04/23/2018 0434   HGBUR SMALL (A) 04/23/2018 0434   BILIRUBINUR NEGATIVE 04/23/2018 0434   KETONESUR NEGATIVE 04/23/2018 0434   PROTEINUR NEGATIVE 04/23/2018 0434   UROBILINOGEN 0.2 04/02/2015 1430   NITRITE  NEGATIVE 04/23/2018 0434   LEUKOCYTESUR NEGATIVE 04/23/2018 0434    Sepsis Labs: Lactic Acid, Venous    Component Value Date/Time   LATICACIDVEN 1.89 04/22/2018 2227    MICROBIOLOGY: Recent Results (from the past 240 hour(s))  MRSA PCR Screening     Status: None   Collection Time: 04/23/18 12:01 AM  Result Value Ref Range Status   MRSA by PCR NEGATIVE NEGATIVE Final    Comment:        The GeneXpert MRSA Assay (FDA approved for NASAL specimens only), is one component of a comprehensive MRSA colonization surveillance program. It is not intended to diagnose MRSA infection nor to guide or monitor treatment for MRSA infections. Performed at [facility], [place] [address] [place], [place] [address]   Urine culture     Status: None   Collection Time: 04/23/18  4:34 AM  Result Value Ref Range Status   Specimen Description URINE, CATHETERIZED  Final   Special Requests NONE  Final   Culture   Final    NO GROWTH Performed at [facility], [address] [place], [place] [address]    Report Status 04/24/2018 FINAL  Final    RADIOLOGY STUDIES/RESULTS: Dg Chest Port 1 View  Result Date: 04/22/2018 CLINICAL DATA:  Altered mental status and fever. EXAM: PORTABLE CHEST 1 VIEW COMPARISON:  09/11/2016 FINDINGS: Lungs are adequately inflated demonstrate patchy hazy bilateral airspace process most notable over the left base and right mid to upper lung. No definite effusion. Cardiomediastinal silhouette and remainder of the exam is unchanged. IMPRESSION: Hazy bilateral multifocal airspace process likely infection. Electronically Signed   By: Marin Olp M.D.   On: 04/22/2018 20:16     LOS: 3 days   Oren Binet, MD  [facility]  If 7PM-7AM, please contact night-coverage  Please page via [email or web address] [email or web address]-click on MD name and type text message  04/25/2018, 1:12 PM

## 2018-04-25 NOTE — Progress Notes (Addendum)
Pt became very agitated while sitting in chair. Pt was trying to get up from the chair. Notified MD. Given orders to administer 2 mg Haldol IV. Awaiting pharmacy verification.

## 2018-04-25 NOTE — Progress Notes (Signed)
   04/25/18 3888  What Happened  Was fall witnessed? Yes  Who witnessed fall? Kassie Mends NT  Patients activity before fall to/from bed, chair, or stretcher  Point of contact buttocks  Was patient injured? No  Follow Up  MD notified Yes  Time MD notified [phone]  Family notified Yes-comment (x2; Sent to voicemail)  Time family notified 0930  Additional tests No  Simple treatment  (Pt assessed and up in chair)  Progress note created (see row info) Yes  Adult Fall Risk Assessment  Risk Factor Category (scoring not indicated) High fall risk per protocol (document High fall risk)  Age 84  Fall History: Fall within 6 months prior to admission 0  Elimination; Bowel and/or Urine Incontinence 2  Elimination; Bowel and/or Urine Urgency/Frequency 0  Medications: includes PCA/Opiates, Anti-convulsants, Anti-hypertensives, Diuretics, Hypnotics, Laxatives, Sedatives, and Psychotropics 5  Patient Care Equipment 2  Mobility-Assistance 2  Mobility-Gait 2  Mobility-Sensory Deficit 0  Altered awareness of immediate physical environment 0  Impulsiveness 0  Lack of understanding of one's physical/cognitive limitations 4  Total Score 20  Patient Fall Risk Level High fall risk  Adult Fall Risk Interventions  Required Bundle Interventions *See Row Information* High fall risk - low, moderate, and high requirements implemented  Additional Interventions Use of appropriate toileting equipment (bedpan, BSC, etc.);Room near nurses station;Reorient/diversional activities with confused patients  Screening for Fall Injury Risk (To be completed on HIGH fall risk patients) - Assessing Need for Low Bed  Risk For Fall Injury- Low Bed Criteria 85 years or older  Will Implement Low Bed and Floor Mats Yes  Vitals  Temp 98.2 F (36.8 C)  Temp Source Axillary  BP (!) 158/71  MAP (mmHg) 94  BP Location Right Arm  BP Method Automatic  Patient Position (if appropriate) Sitting  Pulse Rate 77  Pulse Rate Source  Dinamap  Resp 16  Oxygen Therapy  SpO2 99 %  O2 Device Room Air  Pain Assessment  Pain Scale 0-10  Pain Score 0  Neurological  Neuro (WDL) X  Level of Consciousness Alert  Orientation Level Oriented to person;Disoriented to place;Disoriented to time;Disoriented to situation  Cognition Poor attention/concentration;Poor safety awareness  Speech Clear  Pupil Assessment  No  Neuro Additional Assessments No  Musculoskeletal  Musculoskeletal (WDL) X  Assistive Device BSC  Generalized Weakness Yes  Weight Bearing Restrictions No  Integumentary  Integumentary (WDL) X  Skin Color Appropriate for ethnicity  Skin Condition Dry  Skin Integrity Intact  Skin Turgor Non-tenting

## 2018-04-25 NOTE — Progress Notes (Signed)
Pt brought to nurses station in recliner for safety to prevent from falling. Will continue to monitor.

## 2018-04-25 NOTE — Progress Notes (Signed)
CSW left voicemail for patient's daughter and son regarding SNF choice so they can start insurance authorization.   [name] LCSW [phone]

## 2018-04-26 MED ORDER — QUETIAPINE FUMARATE 25 MG PO TABS
12.5000 mg | ORAL_TABLET | Freq: Every day | ORAL | Status: DC
  Administered 2018-04-26 – 2018-04-27 (×2): 12.5 mg via ORAL
  Filled 2018-04-26 (×2): qty 1

## 2018-04-26 NOTE — Clinical Social Work Note (Signed)
Clinical Social Work Assessment  Patient Details  Name: Wayne Fuller MRN: [id number] Date of Birth: January 17, 1924  Date of referral:  04/26/18               Reason for consult:  Facility Placement                Permission sought to share information with:  Facility Sport and exercise psychologist, Family Supports Permission granted to share information::  No  Name::     Therapist, art::  SNFs  Relationship::  Son/Daughter  Contact Information:  [phone]  Housing/Transportation Living arrangements for the past 2 months:  [place] of Information:  Adult Children Patient Interpreter Needed:  None Criminal Activity/Legal Involvement Pertinent to Current Situation/Hospitalization:  No - Comment as needed Significant Relationships:  Adult Children Lives with:  Facility Resident Do you feel safe going back to the place where you live?  No Need for family participation in patient care:  Yes (Comment)  Care giving concerns:  CSW received consult for possible SNF placement at time of discharge. CSW spoke with patient's son Fritz Pickerel and daughter Claudine Mouton regarding PT recommendation of SNF placement at time of discharge. Patient reported that [facility] is currently unable to care for patient given patient's current physical needs and fall risk. Patient's  expressed understanding of PT recommendation and is agreeable to SNF placement at time of discharge. CSW to continue to follow and assist with discharge planning needs.   Social Worker assessment / plan:  CSW spoke with patient's son and daughter concerning possibility of rehab at [facility] before returning home.  Employment status:  Retired Nurse, adult PT Recommendations:  [place] / Referral to community resources:  [place]  Patient/Family's Response to care:  Patient's family recognizes need for rehab before returning home and is agreeable to a SNF in  [place]. SNF list provided. Patient's son/daughter reported preference for [facility]. Will contact them to see if they can accept patient.   Patient/Family's Understanding of and Emotional Response to Diagnosis, Current Treatment, and Prognosis:  Patient/family is realistic regarding therapy needs and expressed being hopeful for SNF placement. Patient's son/daughter expressed understanding of CSW role and discharge process as well as medical condition. No questions/concerns about plan or treatment.    Emotional Assessment Appearance:  Appears stated age Attitude/Demeanor/Rapport:  Unable to Assess Affect (typically observed):  Unable to Assess Orientation:  Oriented to Self Alcohol / Substance use:  Not Applicable Psych involvement (Current and /or in the community):  No (Comment)  Discharge Needs  Concerns to be addressed:  Care Coordination Readmission within the last 30 days:  No Current discharge risk:  Cognitively Impaired, Dependent with Mobility Barriers to Discharge:  Facility will not accept until restraint criteria met   Benard Halsted, LCSW 04/26/2018, 12:13 PM

## 2018-04-26 NOTE — Progress Notes (Signed)
Patient was agitated and try to get out from the bed given haldol 2 mg intramuscular as per PRN order. Will continue monitor the patient.

## 2018-04-26 NOTE — Progress Notes (Signed)
Physical Therapy Treatment Patient Details Name: Wayne Fuller MRN: [id number] DOB: 12/30/1923 Today's Date: 04/26/2018    History of Present Illness Pt is a 84 y/o male who presents from ALF with fever and AMS. PMH significant for dementia, CKD, HTN, PAF, CAD. Chest x-ray concerning for PNA.     PT Comments    Pt was encouraged to participate in PT.  He needed min assist for getting to EOB, min assist for toilet transfer and peri care and light moderate assist for ambulation in the halls.    Follow Up Recommendations  SNF;Supervision/Assistance - 24 hour     Equipment Recommendations  None recommended by PT    Recommendations for Other Services       Precautions / Restrictions Precautions Precautions: Fall    Mobility  Bed Mobility Overal bed mobility: Needs Assistance Bed Mobility: Supine to Sit     Supine to sit: Min assist     General bed mobility comments: stability assist while pt deciding to come up and scoot to EOB slowly  Transfers Overall transfer level: Needs assistance   Transfers: Sit to/from Stand Sit to Stand: Mod assist         General transfer comment: stability and boost assist.  cues to keep pt redirected to task  Ambulation/Gait Ambulation/Gait assistance: Min assist;Mod assist Gait Distance (Feet): 80 Feet Assistive device: Rolling walker (2 wheeled) Gait Pattern/deviations: Step-through pattern;Decreased step length - right;Decreased step length - left;Decreased stride length   Gait velocity interpretation: <1.31 ft/sec, indicative of household ambulator General Gait Details: flexed posture, mild unsteadiness overall and in need of maneuvering the RW   Stairs             Wheelchair Mobility    Modified Rankin (Stroke Patients Only)       Balance Overall balance assessment: Needs assistance   Sitting balance-Leahy Scale: Poor       Standing balance-Leahy Scale: Poor Standing balance comment: in need of AD and  external assist                            Cognition Arousal/Alertness: Awake/alert Behavior During Therapy: Restless;Impulsive Overall Cognitive Status: History of cognitive impairments - at baseline                                        Exercises      General Comments        Pertinent Vitals/Pain Pain Assessment: Faces Faces Pain Scale: No hurt    Home Living                      Prior Function            PT Goals (current goals can now be found in the care plan section) Acute Rehab PT Goals Patient Stated Goal: Unable to state goals at this time.  PT Goal Formulation: Patient unable to participate in goal setting Time For Goal Achievement: 05/07/18 Potential to Achieve Goals: Fair Progress towards PT goals: Progressing toward goals    Frequency    Min 2X/week      PT Plan Current plan remains appropriate    Co-evaluation              AM-PAC PT "6 Clicks" Mobility   Outcome Measure  Help needed turning from your back to your side  while in a flat bed without using bedrails?: None Help needed moving from lying on your back to sitting on the side of a flat bed without using bedrails?: A Little Help needed moving to and from a bed to a chair (including a wheelchair)?: A Lot Help needed standing up from a chair using your arms (e.g., wheelchair or bedside chair)?: A Little Help needed to walk in hospital room?: A Lot Help needed climbing 3-5 steps with a railing? : Total 6 Click Score: 15    End of Session   Activity Tolerance: Patient tolerated treatment well Patient left: in chair;with call bell/phone within reach;with chair alarm set Nurse Communication: Mobility status PT Visit Diagnosis: Muscle weakness (generalized) (M62.81);Difficulty in walking, not elsewhere classified (R26.2)     Time: 1020-1057 PT Time Calculation (min) (ACUTE ONLY): 37 min  Charges:  $Gait Training: 8-22 mins $Therapeutic  Activity: 8-22 mins                     04/26/2018  Donnella Sham, PT Acute Rehabilitation Services [phone]  (pager) [phone]  (office)   Tessie Fass  04/26/2018, 2:46 PM

## 2018-04-26 NOTE — Progress Notes (Signed)
PROGRESS NOTE        PATIENT DETAILS Name: Wayne Fuller Age: 84 y.o. Sex: male Date of Birth: 09-30-1923 Admit Date: 04/22/2018 Admitting Physician Elwyn Reach, MD DGU:[id number], [name], NP  Brief Narrative: Patient is a 84 y.o. male with history of dementia, CKD stage IV, CAD, PAF not on anticoagulation-presenting with acute encephalopathy in the setting of probable aspiration pneumonia.  See below for further details  Subjective: Pleasantly confused but easily redirectable-follows commands.  Denies any chest pain or shortness of breath.  Assessment/Plan: Acute metabolic encephalopathy: Suspect secondary to pneumonia in a background of dementia.  He is pleasantly confused this morning-he has had few episodes of agitation intermittently.  He has advanced dementia and some delirium is to be expected.  We will start low-dose Seroquel today-and use IV Haldol as needed  Pneumonia: Suspect aspiration-urine streptococcal antigen is positive-continue Augmentin-stop date is 12/7.  Overall improved-afebrile and without any leukocytosis.   Acute urinary retention: Seems to have improved with Flomax-prior MD on 12/3 briefly contemplated placing a Foley catheter-however patient spontaneously voided.  Continue close monitoring--patient voiding successfully.  Dysphagia: Suspect this is a chronic issue related to dementia-evaluated by speech therapy and started on a dysphagia 2 diet.  This was discussed over the phone this morning with son-family request speech therapy evaluation/follow-up at SNF as well.   CKD stage IV: Creatinine close to usual baseline-follow electrolytes periodically.  PAF: Continue amiodarone, beta-blocker-not on anticoagulation-presumably secondary to frailty, advanced age and fall risk.  Hypertension: Controlled continue amlodipine, metoprolol.  Follow.  Dementia with intermittent delirium: Appears pleasantly confused this morning-if he is at  risk for having delirium during this hospital stay-has required PRN IV Haldol-we will start low-dose Seroquel this evening.  Continue Namenda  DVT Prophylaxis: Prophylactic Lovenox  Code Status: Full code   Family Communication: Spoke with patient's son at bedside  Disposition Plan: Remain inpatient- SNF on discharge-when bed available  Antimicrobial agents: Anti-infectives (From admission, onward)   Start     Dose/Rate Route Frequency Ordered Stop   04/25/18 1000  amoxicillin-clavulanate (AUGMENTIN) 500-125 MG per tablet 500 mg     500 mg Oral Every 12 hours 04/25/18 0858 04/28/18 2359   04/25/18 0600  vancomycin (VANCOCIN) IVPB 1000 mg/200 mL premix  Status:  Discontinued     1,000 mg 200 mL/hr over 60 Minutes Intravenous Every 48 hours 04/23/18 0020 04/23/18 1037   04/23/18 1045  cefTRIAXone (ROCEPHIN) 1 g in sodium chloride 0.9 % 100 mL IVPB  Status:  Discontinued     1 g 200 mL/hr over 30 Minutes Intravenous Every 24 hours 04/23/18 1037 04/25/18 0858   04/23/18 0015  ceFEPIme (MAXIPIME) 1 g in sodium chloride 0.9 % 100 mL IVPB  Status:  Discontinued     1 g 200 mL/hr over 30 Minutes Intravenous Every 24 hours 04/23/18 0000 04/23/18 1037   04/23/18 0000  ceFEPIme (MAXIPIME) 1 g in sodium chloride 0.9 % 100 mL IVPB  Status:  Discontinued     1 g 200 mL/hr over 30 Minutes Intravenous Every 8 hours 04/22/18 2352 04/23/18 0000   04/22/18 2300  vancomycin (VANCOCIN) IVPB 1000 mg/200 mL premix     1,000 mg 200 mL/hr over 60 Minutes Intravenous  Once 04/22/18 2248 04/23/18 0240   04/22/18 2030  azithromycin (ZITHROMAX) 500 mg in sodium chloride 0.9 % 250 mL  IVPB  Status:  Discontinued     500 mg 250 mL/hr over 60 Minutes Intravenous Every 24 hours 04/22/18 2024 04/23/18 1037   04/22/18 2030  cefTRIAXone (ROCEPHIN) 1 g in sodium chloride 0.9 % 100 mL IVPB     1 g 200 mL/hr over 30 Minutes Intravenous  Once 04/22/18 2024 04/22/18 2138   04/22/18 2015  cefTRIAXone (ROCEPHIN) 1 g in  sodium chloride 0.9 % 100 mL IVPB  Status:  Discontinued     1 g 200 mL/hr over 30 Minutes Intravenous Every 24 hours 04/22/18 2004 04/22/18 2358      Procedures: None  CONSULTS:  None  Time spent: 25- minutes-Greater than 50% of this time was spent in counseling, explanation of diagnosis, planning of further management, and coordination of care.  MEDICATIONS: Scheduled Meds: . amiodarone  200 mg Oral Daily  . amLODipine  10 mg Oral Daily  . amoxicillin-clavulanate  500 mg Oral Q12H  . aspirin EC  81 mg Oral Daily  . enoxaparin (LOVENOX) injection  30 mg Subcutaneous Q24H  . ferrous sulfate  325 mg Oral Daily  . hydrALAZINE  100 mg Oral TID  . Melatonin  3 mg Oral QHS  . memantine  5 mg Oral Daily  . metoprolol succinate  25 mg Oral Daily  . pantoprazole  40 mg Oral Daily  . QUEtiapine  12.5 mg Oral QHS  . sodium bicarbonate  650 mg Oral BID  . tamsulosin  0.4 mg Oral QPM  . cyanocobalamin  1,000 mcg Oral Daily   Continuous Infusions: PRN Meds:.acetaminophen, guaiFENesin, haloperidol lactate, loperamide, LORazepam, neomycin-bacitracin-polymyxin, traMADol   PHYSICAL EXAM: Vital signs: Vitals:   04/25/18 2030 04/25/18 2055 04/26/18 0600 04/26/18 0949  BP:  (!) 156/64 (!) 169/54 (!) 145/63  Pulse:  69 69 65  Resp:  18 18   Temp: 99.1 F (37.3 C) 98.7 F (37.1 C) 98 F (36.7 C)   TempSrc: Oral Oral Oral   SpO2:  96% 100%   Weight:      Height:       Filed Weights   04/22/18 2315  Weight: 81.6 kg   Body mass index is 23.11 kg/m.   General appearance:Awake, pleasantly confused-not in acute distress Eyes:no scleral icterus. HEENT: Atraumatic and Normocephalic Neck: supple, no JVD. Resp:Good air entry bilaterally,no rales or rhonchi CVS: S1 S2 regular, no murmurs.  GI: Bowel sounds present, Non tender and not distended with no gaurding, rigidity or rebound. Extremities: B/L Lower Ext shows no edema, both legs are warm to touch Neurology: Rectal exam but  appears to be nonfocal  Skin:No Rash, warm and dry Wounds:N/A  I have personally reviewed following labs and imaging studies  LABORATORY DATA: CBC: Recent Labs  Lab 04/22/18 2009 04/23/18 0317 04/24/18 0310  WBC 3.2* 6.1 6.5  NEUTROABS 2.6  --   --   HGB 10.1* 9.3* 8.7*  HCT 32.9* 30.2* 27.8*  MCV 100.6* 100.7* 100.0  PLT 188 182 952    Basic Metabolic Panel: Recent Labs  Lab 04/22/18 2009 04/23/18 0317 04/24/18 0310 04/25/18 0347  NA 135 138 140 142  K 4.1 4.4 4.1 4.5  CL 104 108 113* 112*  CO2 19* 20* 21* 19*  GLUCOSE 53* 119* 100* 126*  BUN 38* 34* 32* 31*  CREATININE 2.90* 2.73* 2.44* 2.34*  CALCIUM 8.1* 7.9* 7.7* 8.4*  MG  --   --  1.8  --     GFR: Estimated Creatinine Clearance: 23.2 mL/min (A) (by  C-G formula based on SCr of 2.34 mg/dL (H)).  Liver Function Tests: Recent Labs  Lab 04/22/18 2009 04/23/18 0317  AST 20 29  ALT 10 11  ALKPHOS 49 39  BILITOT 0.5 0.4  PROT 6.5 5.5*  ALBUMIN 3.5 2.8*   No results for input(s): LIPASE, AMYLASE in the last 168 hours. No results for input(s): AMMONIA in the last 168 hours.  Coagulation Profile: No results for input(s): INR, PROTIME in the last 168 hours.  Cardiac Enzymes: Recent Labs  Lab 04/22/18 2009  TROPONINI 0.05*    BNP (last 3 results) No results for input(s): PROBNP in the last 8760 hours.  HbA1C: No results for input(s): HGBA1C in the last 72 hours.  CBG: Recent Labs  Lab 04/22/18 2010  GLUCAP 56*    Lipid Profile: No results for input(s): CHOL, HDL, LDLCALC, TRIG, CHOLHDL, LDLDIRECT in the last 72 hours.  Thyroid Function Tests: No results for input(s): TSH, T4TOTAL, FREET4, T3FREE, THYROIDAB in the last 72 hours.  Anemia Panel: No results for input(s): VITAMINB12, FOLATE, FERRITIN, TIBC, IRON, RETICCTPCT in the last 72 hours.  Urine analysis:    Component Value Date/Time   COLORURINE YELLOW 04/23/2018 [place] 04/23/2018 0434   LABSPEC 1.012  04/23/2018 0434   PHURINE 5.0 04/23/2018 0434   GLUCOSEU NEGATIVE 04/23/2018 0434   HGBUR SMALL (A) 04/23/2018 0434   BILIRUBINUR NEGATIVE 04/23/2018 0434   KETONESUR NEGATIVE 04/23/2018 0434   PROTEINUR NEGATIVE 04/23/2018 0434   UROBILINOGEN 0.2 04/02/2015 1430   NITRITE NEGATIVE 04/23/2018 0434   LEUKOCYTESUR NEGATIVE 04/23/2018 0434    Sepsis Labs: Lactic Acid, Venous    Component Value Date/Time   LATICACIDVEN 1.89 04/22/2018 2227    MICROBIOLOGY: Recent Results (from the past 240 hour(s))  MRSA PCR Screening     Status: None   Collection Time: 04/23/18 12:01 AM  Result Value Ref Range Status   MRSA by PCR NEGATIVE NEGATIVE Final    Comment:        The GeneXpert MRSA Assay (FDA approved for NASAL specimens only), is one component of a comprehensive MRSA colonization surveillance program. It is not intended to diagnose MRSA infection nor to guide or monitor treatment for MRSA infections. Performed at [facility], [place] [address] [place], [place] [address]   Urine culture     Status: None   Collection Time: 04/23/18  4:34 AM  Result Value Ref Range Status   Specimen Description URINE, CATHETERIZED  Final   Special Requests NONE  Final   Culture   Final    NO GROWTH Performed at [facility], [address], [place], [place] [address]    Report Status 04/24/2018 FINAL  Final    RADIOLOGY STUDIES/RESULTS: Dg Chest Port 1 View  Result Date: 04/22/2018 CLINICAL DATA:  Altered mental status and fever. EXAM: PORTABLE CHEST 1 VIEW COMPARISON:  09/11/2016 FINDINGS: Lungs are adequately inflated demonstrate patchy hazy bilateral airspace process most notable over the left base and right mid to upper lung. No definite effusion. Cardiomediastinal silhouette and remainder of the exam is unchanged. IMPRESSION: Hazy bilateral multifocal airspace process likely infection. Electronically Signed   By: Marin Olp M.D.   On: 04/22/2018 20:16     LOS: 4 days    Oren Binet, MD  [facility]  If 7PM-7AM, please contact night-coverage  Please page via [email or web address]-Password [email or web address]-click on MD name and type text message  04/26/2018, 11:35 AM

## 2018-04-26 NOTE — Progress Notes (Signed)
Patient's son and daughter haven requested [facility]. Their liaison came to assess the patient, but they are concerned because patient was "held in the chair" by the chair alarm and had to receive ativan. They request to re-evaluate patient tomorrow without the chair device to see how patient acts. They are concerned because SNFs do not have chair alarms or restraints and would not be able to keep patient safe if he is constantly trying to get up.   [name] LCSW [phone]

## 2018-04-27 DIAGNOSIS — G301 Alzheimer's disease with late onset: Secondary | ICD-10-CM

## 2018-04-27 DIAGNOSIS — F0281 Dementia in other diseases classified elsewhere with behavioral disturbance: Secondary | ICD-10-CM

## 2018-04-27 MED ORDER — TRAMADOL HCL 50 MG PO TABS: 50.0000 mg | ORAL_TABLET | Freq: Four times a day (QID) | ORAL | 0 refills | Status: DC | PRN

## 2018-04-27 MED ORDER — AMOXICILLIN-POT CLAVULANATE 500-125 MG PO TABS: 500.0000 mg | ORAL_TABLET | Freq: Two times a day (BID) | ORAL | Status: DC

## 2018-04-27 MED ORDER — LORAZEPAM 0.5 MG PO TABS: 0.5000 mg | ORAL_TABLET | Freq: Three times a day (TID) | ORAL | 0 refills | Status: DC | PRN

## 2018-04-27 MED ORDER — QUETIAPINE FUMARATE 25 MG PO TABS: 12.5000 mg | ORAL_TABLET | Freq: Every day | ORAL | Status: AC

## 2018-04-27 NOTE — Discharge Summary (Addendum)
PATIENT DETAILS Name: Wayne Fuller Age: 84 y.o. Sex: male Date of Birth: Dec 17, 1923 MRN: [id number]. Admitting Physician: Elwyn Reach, MD [id number], [name], NP  Admit Date: 04/22/2018 Discharge date: 04/28/2018  Recommendations for Outpatient Follow-up:  1. Follow up with PCP in 1-2 weeks 2. Please obtain BMP/CBC in one week 3. Please ensure follow-up with speech therapy at SNF  Admitted From:  ALF   Disposition: SNF   Home Health: No  Equipment/Devices: None  Discharge Condition: Stable  CODE STATUS: FULL CODE  Diet recommendation:  Heart Healthy -dys 2 diet-see below  Brief Summary: See H&P, Labs, Consult and Test reports for all details in brief,Patient is a 84 y.o. male with history of dementia, CKD stage IV, CAD, PAF not on anticoagulation-presenting with acute encephalopathy in the setting of probable aspiration pneumonia.  See below for further details  Brief Hospital Course: Acute metabolic encephalopathy: Suspect secondary to pneumonia in a background of dementia.  He is pleasantly confused this morning-he has had few episodes of agitation intermittently during this hospital stay but he had a uneventful last night.He has advanced dementia and some delirium is to be expected.    Pneumonia: Suspect aspiration-urine streptococcal antigen is positive-continue Augmentin-stop date is 12/7.  Overall improved-afebrile and without any leukocytosis.   Acute urinary retention: Seems to have improved with Flomax-prior MD on 12/3 briefly contemplated placing a Foley catheter-however patient spontaneously voided.  Continue close monitoring--patient voiding successfully.  Dysphagia: Suspect this is a chronic issue related to dementia-evaluated by speech therapy and started on a dysphagia 2 diet. This was discussed over the phone on 12/5 with son-family request speech therapy evaluation/follow-up at SNF as well.   CKD stage IV: Creatinine close to usual  baseline-follow electrolytes periodically.  PAF: Continue amiodarone, beta-blocker-not on anticoagulation-presumably secondary to frailty, advanced age and fall risk.  Hypertension: Controlled continue amlodipine, metoprolol.  Follow.  Dementia with intermittent delirium: Appears pleasantly confused this morning-if he is at risk for having delirium during this hospital stay-has required PRN IV Haldol-seems to be tolerating low-dose Seroquel well-continue on discharge.    Procedures/Studies: None  Discharge Diagnoses:  Principal Problem:   HCAP (healthcare-associated pneumonia) Active Problems:   Essential hypertension   CAD (coronary artery disease)   TOBACCO ABUSE, HX OF   CKD (chronic kidney disease) stage 4, GFR 15-29 ml/min (HCC)   GERD (gastroesophageal reflux disease)   Paroxysmal A-fib (HCC)   Dementia with behavioral disturbance (HCC)   Hypoglycemia   Discharge Instructions:  Activity:  As tolerated with Full fall precautions use walker/cane & assistance as needed  Discharge Instructions    Diet - low sodium heart healthy   Complete by:  As directed    Diet recommendations: Dysphagia 2 (fine chop);Thin liquid Liquids provided via: Cup;Straw Medication Administration: Whole meds with puree Supervision: Patient able to self feed;Full supervision/cueing for compensatory strategies Compensations: Minimize environmental distractions;Slow rate;Small sips/bites Postural Changes and/or Swallow Maneuvers: Seated upright 90 degrees;Upright 30-60 min after meal   Increase activity slowly   Complete by:  As directed      Allergies as of 04/28/2018   No Known Allergies     Medication List    STOP taking these medications   magnesium hydroxide 400 MG/5ML suspension Commonly known as:  MILK OF MAGNESIA   MINTOX 200-200-20 MG/5ML suspension Generic drug:  alum & mag hydroxide-simeth     TAKE these medications   acetaminophen 500 MG tablet Commonly known as:   TYLENOL Take 500 mg by  mouth every 6 (six) hours as needed for headache (minor discomfort, fever 99.5-101F).   amiodarone 200 MG tablet Commonly known as:  PACERONE Take 1 tablet (200 mg total) by mouth daily. What changed:  additional instructions   amLODipine 10 MG tablet Commonly known as:  NORVASC Take 1 tablet (10 mg total) by mouth daily. What changed:  additional instructions   amoxicillin-clavulanate 500-125 MG tablet Commonly known as:  AUGMENTIN Take 1 tablet (500 mg total) by mouth every 12 (twelve) hours. Last dose on 12/7 evening   aspirin EC 81 MG tablet Take 81 mg by mouth daily. For CAD   cyanocobalamin 1000 MCG tablet Take 1,000 mcg by mouth daily. Vitamin B12   ferrous sulfate 325 (65 FE) MG tablet Take 325 mg by mouth daily. For Anemia   hydrALAZINE 100 MG tablet Commonly known as:  APRESOLINE Take 1 tablet (100 mg total) by mouth 3 (three) times daily. What changed:  additional instructions   loperamide 2 MG capsule Commonly known as:  IMODIUM Take 2 mg by mouth as needed for diarrhea or loose stools (not to exceed 8 doses in 24 hours).   LORazepam 0.5 MG tablet Commonly known as:  ATIVAN Take 1 tablet (0.5 mg total) by mouth every 8 (eight) hours as needed (agitation).   Melatonin 3 MG Tabs Take 3 mg by mouth at bedtime. For sleep   memantine 5 MG tablet Commonly known as:  NAMENDA Take 5 mg by mouth daily. For Dementia   metoprolol succinate 25 MG 24 hr tablet Commonly known as:  TOPROL-XL Take 1 tablet (25 mg total) by mouth daily. What changed:  additional instructions   omeprazole 20 MG capsule Commonly known as:  PRILOSEC Take 20 mg by mouth daily at 6 (six) AM. For GERD   QUEtiapine 25 MG tablet Commonly known as:  SEROQUEL Take 0.5 tablets (12.5 mg total) by mouth at bedtime.   ROBAFEN 100 MG/5ML syrup Generic drug:  guaifenesin Take 200 mg by mouth every 6 (six) hours as needed for cough.   sodium bicarbonate 650 MG  tablet Take 1 tablet (650 mg total) by mouth 2 (two) times daily. What changed:  additional instructions   tamsulosin 0.4 MG Caps capsule Commonly known as:  FLOMAX TAKE 1 CAPSULE(0.4 MG) BY MOUTH DAILY AFTER SUPPER What changed:  See the new instructions.   traMADol 50 MG tablet Commonly known as:  ULTRAM Take 1 tablet (50 mg total) by mouth every 6 (six) hours as needed for moderate pain.   TRIPLE ANTIBIOTIC [id number] Oint Apply 1 application topically 4 (four) times daily as needed (minor skin tears or abrasions). Clean area with normal saline, apply triple antibiotic, cover with bandaid or gauze and tape, change as needed until healed.       Contact information for follow-up providers    [name], NP. Schedule an appointment as soon as possible for a visit in 1 week(s).   Specialty:  Geriatric Medicine Contact information: [place]. [place] [address] [phone]            Contact information for after-discharge care    Destination    [facility] .   Service:  Skilled Nursing Contact information: [place] [phone]                 No Known Allergies  Consultations:   None  Other Procedures/Studies: Dg Chest Port 1 View  Result Date: 04/22/2018  CLINICAL DATA:  Altered mental status and fever. EXAM: PORTABLE CHEST 1 VIEW COMPARISON:  09/11/2016 FINDINGS: Lungs are adequately inflated demonstrate patchy hazy bilateral airspace process most notable over the left base and right mid to upper lung. No definite effusion. Cardiomediastinal silhouette and remainder of the exam is unchanged. IMPRESSION: Hazy bilateral multifocal airspace process likely infection. Electronically Signed   By: Marin Olp M.D.   On: 04/22/2018 20:16     TODAY-DAY OF DISCHARGE:  Subjective:   Wayne Fuller today mains pleasantly confused-he was sleeping at the bedside chair.  He  awoke-follows some commands. Objective:   Blood pressure (!) 180/59, pulse 72, temperature 98.1 F (36.7 C), temperature source Oral, resp. rate 16, height 6\' 2"  (1.88 m), weight 81.6 kg, SpO2 97 %.  Intake/Output Summary (Last 24 hours) at 04/28/2018 1022 Last data filed at 04/28/2018 0900 Gross per 24 hour  Intake 1526 ml  Output 300 ml  Net 1226 ml   Filed Weights   04/22/18 2315  Weight: 81.6 kg    Exam: Awake pleasantly confused, no new F.N deficits, Normal affect [place].AT,PERRAL Supple Neck,No JVD, No cervical lymphadenopathy appriciated.  Symmetrical Chest wall movement, Good air movement bilaterally, CTAB RRR,No Gallops,Rubs or new Murmurs, No Parasternal Heave +ve B.Sounds, Abd Soft, Non tender, No organomegaly appriciated, No rebound -guarding or rigidity. No Cyanosis, Clubbing or edema, No new Rash or bruise   PERTINENT RADIOLOGIC STUDIES: Dg Chest Port 1 View  Result Date: 04/22/2018 CLINICAL DATA:  Altered mental status and fever. EXAM: PORTABLE CHEST 1 VIEW COMPARISON:  09/11/2016 FINDINGS: Lungs are adequately inflated demonstrate patchy hazy bilateral airspace process most notable over the left base and right mid to upper lung. No definite effusion. Cardiomediastinal silhouette and remainder of the exam is unchanged. IMPRESSION: Hazy bilateral multifocal airspace process likely infection. Electronically Signed   By: Marin Olp M.D.   On: 04/22/2018 20:16     PERTINENT LAB RESULTS: CBC: No results for input(s): WBC, HGB, HCT, PLT in the last 72 hours. CMET CMP     Component Value Date/Time   NA 142 04/25/2018 0347   NA 142 12/07/2016   K 4.5 04/25/2018 0347   CL 112 (H) 04/25/2018 0347   CO2 19 (L) 04/25/2018 0347   GLUCOSE 126 (H) 04/25/2018 0347   BUN 31 (H) 04/25/2018 0347   BUN 36 (A) 12/07/2016   CREATININE 2.34 (H) 04/25/2018 0347   CREATININE 2.66 (H) 10/24/2017 1603   CALCIUM 8.4 (L) 04/25/2018 0347   PROT 5.5 (L) 04/23/2018 0317   ALBUMIN  2.8 (L) 04/23/2018 0317   AST 29 04/23/2018 0317   ALT 11 04/23/2018 0317   ALKPHOS 39 04/23/2018 0317   BILITOT 0.4 04/23/2018 0317   GFRNONAA 23 (L) 04/25/2018 0347   GFRNONAA 20 (L) 10/24/2017 1603   GFRAA 27 (L) 04/25/2018 0347   GFRAA 23 (L) 10/24/2017 1603    GFR Estimated Creatinine Clearance: 23.2 mL/min (A) (by C-G formula based on SCr of 2.34 mg/dL (H)). No results for input(s): LIPASE, AMYLASE in the last 72 hours. No results for input(s): CKTOTAL, CKMB, CKMBINDEX, TROPONINI in the last 72 hours. Invalid input(s): POCBNP No results for input(s): DDIMER in the last 72 hours. No results for input(s): HGBA1C in the last 72 hours. No results for input(s): CHOL, HDL, LDLCALC, TRIG, CHOLHDL, LDLDIRECT in the last 72 hours. No results for input(s): TSH, T4TOTAL, T3FREE, THYROIDAB in the last 72 hours.  Invalid input(s): FREET3 No results for input(s): VITAMINB12, FOLATE,  FERRITIN, TIBC, IRON, RETICCTPCT in the last 72 hours. Coags: No results for input(s): INR in the last 72 hours.  Invalid input(s): PT Microbiology: Recent Results (from the past 240 hour(s))  MRSA PCR Screening     Status: None   Collection Time: 04/23/18 12:01 AM  Result Value Ref Range Status   MRSA by PCR NEGATIVE NEGATIVE Final    Comment:        The GeneXpert MRSA Assay (FDA approved for NASAL specimens only), is one component of a comprehensive MRSA colonization surveillance program. It is not intended to diagnose MRSA infection nor to guide or monitor treatment for MRSA infections. Performed at [facility], [place] [address], [place], [place] [address]   Urine culture     Status: None   Collection Time: 04/23/18  4:34 AM  Result Value Ref Range Status   Specimen Description URINE, CATHETERIZED  Final   Special Requests NONE  Final   Culture   Final    NO GROWTH Performed at [facility], [address] [place], [place] [address]    Report Status 04/24/2018 FINAL  Final     FURTHER DISCHARGE INSTRUCTIONS:  Get Medicines reviewed and adjusted: Please take all your medications with you for your next visit with your Primary MD  Laboratory/radiological data: Please request your Primary MD to go over all hospital tests and procedure/radiological results at the follow up, please ask your Primary MD to get all Hospital records sent to his/her office.  In some cases, they will be blood work, cultures and biopsy results pending at the time of your discharge. Please request that your primary care M.D. goes through all the records of your hospital data and follows up on these results.  Also Note the following: If you experience worsening of your admission symptoms, develop shortness of breath, life threatening emergency, suicidal or homicidal thoughts you must seek medical attention immediately by calling 911 or calling your MD immediately  if symptoms less severe.  You must read complete instructions/literature along with all the possible adverse reactions/side effects for all the Medicines you take and that have been prescribed to you. Take any new Medicines after you have completely understood and accpet all the possible adverse reactions/side effects.   Do not drive when taking Pain medications or sleeping medications (Benzodaizepines)  Do not take more than prescribed Pain, Sleep and Anxiety Medications. It is not advisable to combine anxiety,sleep and pain medications without talking with your primary care practitioner  Special Instructions: If you have smoked or chewed Tobacco  in the last 2 yrs please stop smoking, stop any regular Alcohol  and or any Recreational drug use.  Wear Seat belts while driving.  Please note: You were cared for by a hospitalist during your hospital stay. Once you are discharged, your primary care physician will handle any further medical issues. Please note that NO REFILLS for any discharge medications will be authorized once you  are discharged, as it is imperative that you return to your primary care physician (or establish a relationship with a primary care physician if you do not have one) for your post hospital discharge needs so that they can reassess your need for medications and monitor your lab values.  Total Time spent coordinating discharge including counseling, education and face to face time equals 35 minutes.  SignedOren Binet 04/28/2018 10:22 AM

## 2018-04-27 NOTE — Progress Notes (Signed)
Family still has not shown up to sign paperwork at [facility]. They are unable to accept patient until then. Weekend CSW to follow up on Saturday.   [name] LCSW [phone]

## 2018-04-27 NOTE — Progress Notes (Signed)
Received report from night RN. Patient in chair. Per RN patient had an uneventful night. Chose to sleep in chair, chair alarm only.

## 2018-04-27 NOTE — Progress Notes (Addendum)
3:45pm-Wayne Fuller has insurance approval. Awaiting family to sign paperwork at 4pm and then can contact [facility].   1:17pm-Wayne Fuller awaiting insurance authorization. Patient's daughter states her brother will go to the facility to sign admission paperwork.   [name] LCSW [phone]

## 2018-04-27 NOTE — NC FL2 (Signed)
[place] LEVEL OF CARE SCREENING TOOL     IDENTIFICATION  Patient Name: Wayne Fuller Birthdate: [birth year] Sex: male Admission Date (Current Location): 04/22/2018  [facility] and [place] Number:  Herbalist and Address:  [facility], [place] [address] [place], [place] [address]      Provider Number: [id number]  Attending Physician Name and Address:  Jonetta Osgood, MD  Relative Name and Phone Number:  Fritz Pickerel, son , [phone]    Current Level of Care: Hospital Recommended Level of Care: [place] Prior Approval Number:    Date Approved/Denied:   PASRR Number: [id number]  Discharge Plan: SNF    Current Diagnoses: Patient Active Problem List   Diagnosis Date Noted  . HCAP (healthcare-associated pneumonia) 04/22/2018  . Hypoglycemia 04/22/2018  . S/p left hip fracture 09/29/2016  . Dehydration   . 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 Dislocation of internal left hip prosthesis ([place]) 09/21/2016  . Hyperkalemia 09/21/2016  . Protein-calorie malnutrition, severe 09/21/2016  . Anemia, chronic disease 01/30/2016  . Acute kidney injury superimposed on CKD ([place]) 01/30/2016  . Dementia with behavioral disturbance ([place]) 01/19/2016  . Back pain 01/19/2016  . Elevated troponin 04/02/2015  . Paroxysmal A-fib ([place]) 04/02/2015  . CKD (chronic kidney disease) stage 4, GFR 15-29 ml/min (HCC) 08/11/2014  . GERD (gastroesophageal reflux disease) 08/11/2014  . Essential hypertension 04/29/2009  . CAD (coronary artery disease) 04/29/2009  . TOBACCO ABUSE, HX OF 04/29/2009    Orientation RESPIRATION BLADDER Height & Weight     Self  Normal Continent Weight: 81.6 kg Height:  6\' 2"  (188 cm)  BEHAVIORAL SYMPTOMS/MOOD NEUROLOGICAL BOWEL NUTRITION STATUS  (Confused)   Continent Diet(Please see DC Summary)  AMBULATORY STATUS COMMUNICATION OF NEEDS Skin   Limited Assist Verbally  Normal                       Personal Care Assistance Level of Assistance  Bathing, Feeding, Dressing Bathing Assistance: Maximum assistance Feeding assistance: Limited assistance Dressing Assistance: Limited assistance     Functional Limitations Info  Sight, Hearing, Speech Sight Info: Adequate Hearing Info: Adequate Speech Info: Adequate(Sometimes hard to understand)    [place]  PT (By licensed PT), OT (By licensed OT)     PT Frequency: 5x/week OT Frequency: 3x/week            Contractures Contractures Info: Not present    Additional Factors Info  Psychotropic Code Status Info: Full Allergies Info: NKA Psychotropic Info: Seroquel (Ativan on home med list)         Current Medications (04/27/2018):  This is the current hospital active medication list Current Facility-Administered Medications  Medication Dose Route Frequency Provider Last Rate Last Dose  . acetaminophen (TYLENOL) tablet 500 mg  500 mg Oral Q6H PRN Elwyn Reach, MD      . amiodarone (PACERONE) tablet 200 mg  200 mg Oral Daily Gala Romney L, MD   200 mg at 04/27/18 1004  . amLODipine (NORVASC) tablet 10 mg  10 mg Oral Daily Gala Romney L, MD   10 mg at 04/27/18 1004  . amoxicillin-clavulanate (AUGMENTIN) 500-125 MG per tablet 500 mg  500 mg Oral Q12H Ghimire, Henreitta Leber, MD   500 mg at 04/27/18 1004  . aspirin EC tablet 81 mg  81 mg Oral Daily Gala Romney L, MD   81 mg at 04/27/18 1004  . enoxaparin (  LOVENOX) injection 30 mg  30 mg Subcutaneous Q24H Thurnell Lose, MD   30 mg at 04/27/18 0058  . ferrous sulfate tablet 325 mg  325 mg Oral Daily Elwyn Reach, MD   325 mg at 04/27/18 1004  . guaiFENesin (ROBITUSSIN) 100 MG/5ML solution 200 mg  200 mg Oral Q6H PRN Gala Romney L, MD      . haloperidol lactate (HALDOL) injection 2 mg  2 mg Intramuscular Q6H PRN Jonetta Osgood, MD   2 mg at 04/25/18 2138  . hydrALAZINE (APRESOLINE) tablet 100 mg  100  mg Oral TID Elwyn Reach, MD   100 mg at 04/27/18 1004  . loperamide (IMODIUM) capsule 2 mg  2 mg Oral PRN Gala Romney L, MD      . LORazepam (ATIVAN) tablet 0.5 mg  0.5 mg Oral Q8H PRN Elwyn Reach, MD   0.5 mg at 04/26/18 0829  . Melatonin TABS 3 mg  3 mg Oral QHS Elwyn Reach, MD   3 mg at 04/26/18 2204  . memantine (NAMENDA) tablet 5 mg  5 mg Oral Daily Gala Romney L, MD   5 mg at 04/27/18 1004  . metoprolol succinate (TOPROL-XL) 24 hr tablet 25 mg  25 mg Oral Daily Gala Romney L, MD   25 mg at 04/27/18 1004  . neomycin-bacitracin-polymyxin (NEOSPORIN) ointment 1 application  1 application Topical QID PRN Gala Romney L, MD      . pantoprazole (PROTONIX) EC tablet 40 mg  40 mg Oral Daily Gala Romney L, MD   40 mg at 04/27/18 1004  . QUEtiapine (SEROQUEL) tablet 12.5 mg  12.5 mg Oral QHS Jonetta Osgood, MD   12.5 mg at 04/26/18 2206  . sodium bicarbonate tablet 650 mg  650 mg Oral BID Gala Romney L, MD   650 mg at 04/27/18 1004  . tamsulosin (FLOMAX) capsule 0.4 mg  0.4 mg Oral QPM Gala Romney L, MD   0.4 mg at 04/26/18 1810  . traMADol (ULTRAM) tablet 50 mg  50 mg Oral Q6H PRN Elwyn Reach, MD   50 mg at 04/25/18 2137  . vitamin B-12 (CYANOCOBALAMIN) tablet 1,000 mcg  1,000 mcg Oral Daily Elwyn Reach, MD   1,000 mcg at 04/27/18 1004     Discharge Medications: Please see discharge summary for a list of discharge medications.  Relevant Imaging Results:  Relevant Lab Results:   Additional Information SSN [id number]  Benard Halsted, LCSW

## 2018-04-28 NOTE — Progress Notes (Addendum)
Clinical Social Worker facilitated patient discharge including contacting patient family and facility to confirm patient discharge plans.  Clinical information faxed to facility and family agreeable with plan.  CSW arranged ambulance transport via [facility] to [facility].  RN to call for report prior to discharge [phone]) Room 111.  Clinical Social Worker will sign off for now as social work intervention is no longer needed. Please consult [place] again if new need arises.  Mahamad Jabbi LCSW [phone]

## 2018-04-28 NOTE — Progress Notes (Addendum)
Nsg Discharge Note  Admit Date:  04/22/2018 Discharge date: 04/28/2018   Wayne Fuller to be D/C'd [facility] per MD order.  AVS completed.  Copy for chart, and copy for patient signed, and dated. Patient/caregiver able to verbalize understanding.  Discharge Medication: Allergies as of 04/28/2018   No Known Allergies     Medication List    STOP taking these medications   magnesium hydroxide 400 MG/5ML suspension Commonly known as:  MILK OF MAGNESIA     TAKE these medications   acetaminophen 500 MG tablet Commonly known as:  TYLENOL Take 500 mg by mouth every 6 (six) hours as needed for headache (minor discomfort, fever 99.5-101F).   amiodarone 200 MG tablet Commonly known as:  PACERONE Take 1 tablet (200 mg total) by mouth daily. What changed:  additional instructions   amLODipine 10 MG tablet Commonly known as:  NORVASC Take 1 tablet (10 mg total) by mouth daily. What changed:  additional instructions   amoxicillin-clavulanate 500-125 MG tablet Commonly known as:  AUGMENTIN Take 1 tablet (500 mg total) by mouth every 12 (twelve) hours. Last dose on 12/7 evening   aspirin EC 81 MG tablet Take 81 mg by mouth daily. For CAD   cyanocobalamin 1000 MCG tablet Take 1,000 mcg by mouth daily. Vitamin B12   ferrous sulfate 325 (65 FE) MG tablet Take 325 mg by mouth daily. For Anemia   hydrALAZINE 100 MG tablet Commonly known as:  APRESOLINE Take 1 tablet (100 mg total) by mouth 3 (three) times daily. What changed:  additional instructions   loperamide 2 MG capsule Commonly known as:  IMODIUM Take 2 mg by mouth as needed for diarrhea or loose stools (not to exceed 8 doses in 24 hours).   LORazepam 0.5 MG tablet Commonly known as:  ATIVAN Take 1 tablet (0.5 mg total) by mouth every 8 (eight) hours as needed (agitation).   Melatonin 3 MG Tabs Take 3 mg by mouth at bedtime. For sleep   memantine 5 MG tablet Commonly known as:  NAMENDA Take 5 mg by mouth daily. For  Dementia   metoprolol succinate 25 MG 24 hr tablet Commonly known as:  TOPROL-XL Take 1 tablet (25 mg total) by mouth daily. What changed:  additional instructions   MINTOX 200-200-20 MG/5ML suspension Generic drug:  alum & mag hydroxide-simeth Take 30 mLs by mouth 4 (four) times daily as needed for indigestion or heartburn.   omeprazole 20 MG capsule Commonly known as:  PRILOSEC Take 20 mg by mouth daily at 6 (six) AM. For GERD   QUEtiapine 25 MG tablet Commonly known as:  SEROQUEL Take 0.5 tablets (12.5 mg total) by mouth at bedtime.   ROBAFEN 100 MG/5ML syrup Generic drug:  guaifenesin Take 200 mg by mouth every 6 (six) hours as needed for cough.   sodium bicarbonate 650 MG tablet Take 1 tablet (650 mg total) by mouth 2 (two) times daily. What changed:  additional instructions   tamsulosin 0.4 MG Caps capsule Commonly known as:  FLOMAX TAKE 1 CAPSULE(0.4 MG) BY MOUTH DAILY AFTER SUPPER What changed:  See the new instructions.   traMADol 50 MG tablet Commonly known as:  ULTRAM Take 1 tablet (50 mg total) by mouth every 6 (six) hours as needed for moderate pain.   TRIPLE ANTIBIOTIC [phone] Oint Apply 1 application topically 4 (four) times daily as needed (minor skin tears or abrasions). Clean area with normal saline, apply triple antibiotic, cover with bandaid or gauze and tape, change  as needed until healed.       Discharge Assessment: Vitals:   04/28/18 0538 04/28/18 0920  BP: (!) 145/54 (!) 180/59  Pulse: 64 72  Resp: 16   Temp: 98.1 F (36.7 C)   SpO2: 97%    Skin clean, dry and intact without evidence of skin break down, no evidence of skin tears noted. IV catheter discontinued intact. Site without signs and symptoms of complications - no redness or edema noted at insertion site, patient denies c/o pain - only slight tenderness at site.  Dressing with slight pressure applied.  D/c Instructions-Education: Discharge instructions given to patient/family  with verbalized understanding. D/c education completed with patient/family including follow up instructions, medication list, d/c activities limitations if indicated, with other d/c instructions as indicated by MD - patient able to verbalize understanding, all questions fully answered. Patient instructed to return to ED, call 911, or call MD for any changes in condition.  Patient transported via [facility]. Report given to Nurse [place] at [facility], RN 04/28/2018 9:58 AM

## 2018-07-26 IMAGING — CR DG FEMUR 2+V*L*
4 series · 4 of 4 positions shown · non-contrast
Comparison: None.

CLINICAL DATA: Per EMS, pt coming from [facility]. Staff reported
pt fell out of bed. Bed is very low to the ground. Pt has splint
brace on left leg

EXAM:
LEFT FEMUR 2 VIEWS

[x femur proximal ap left]
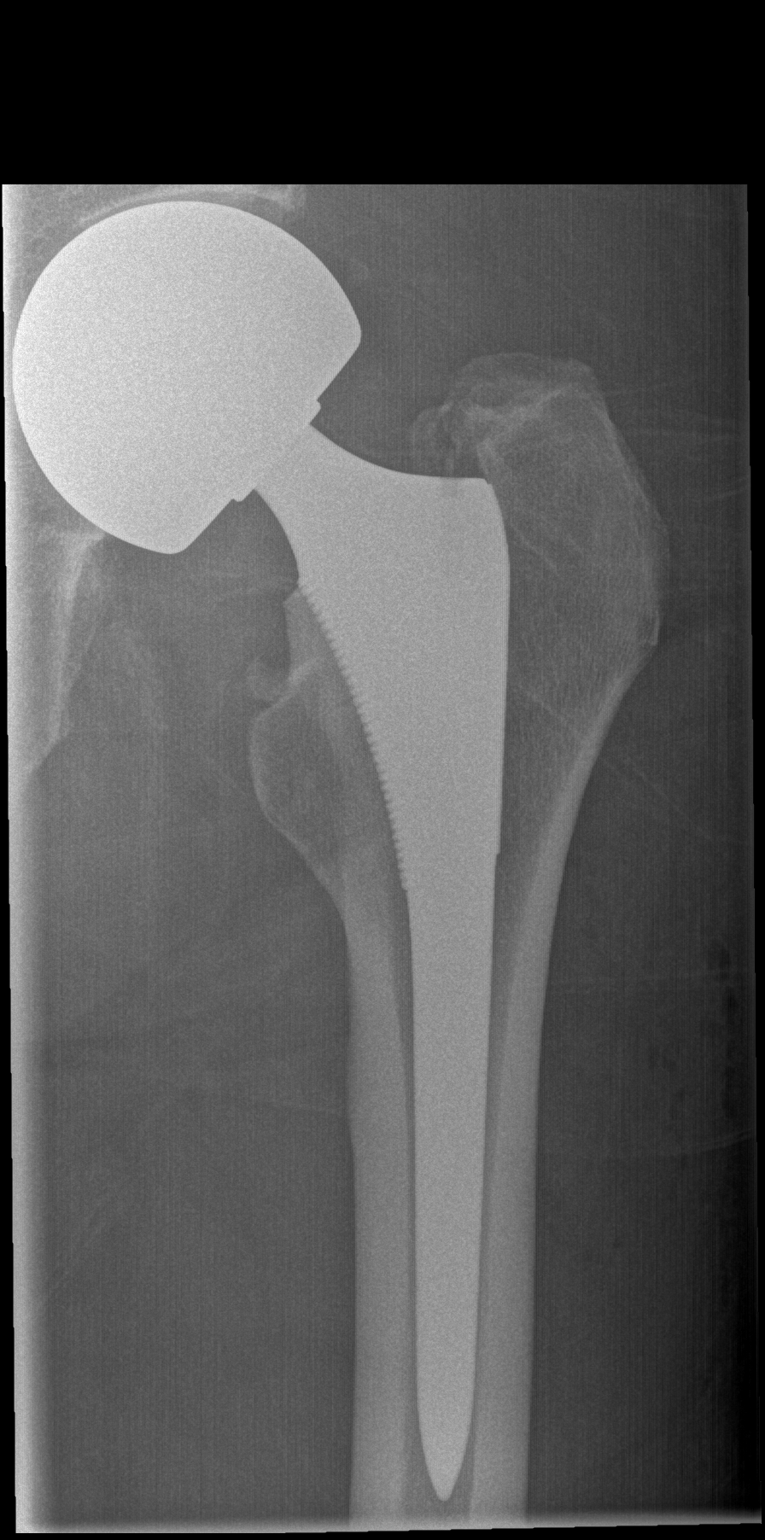

[x femur distal ap left (1 of 2)]
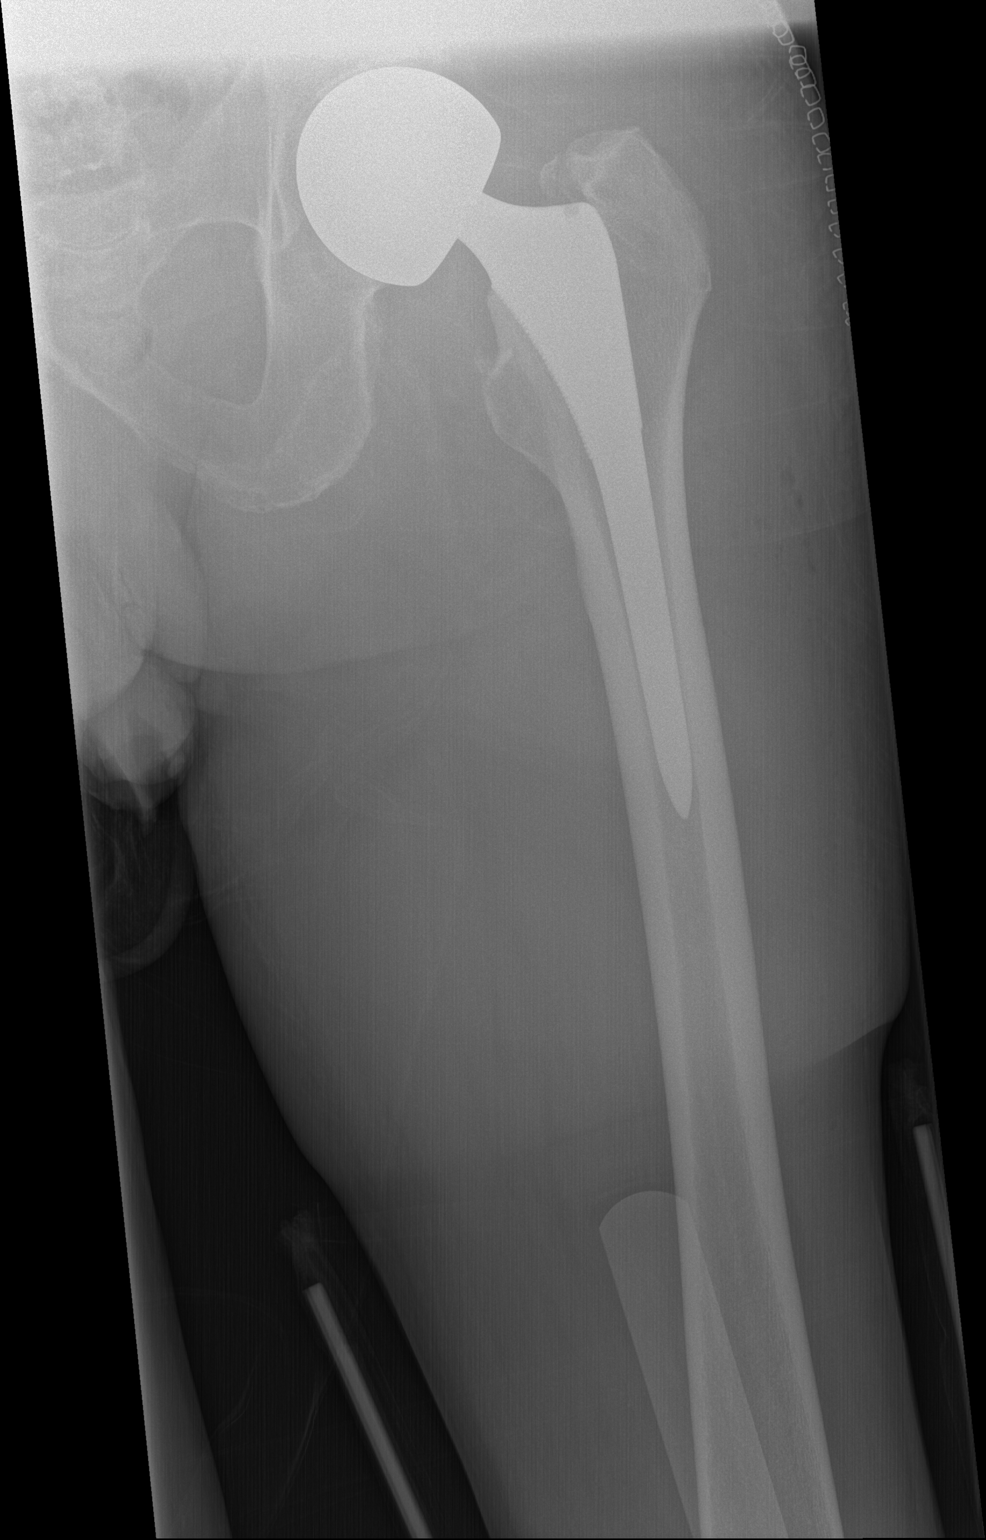

[x femur distal ap left (2 of 2)]
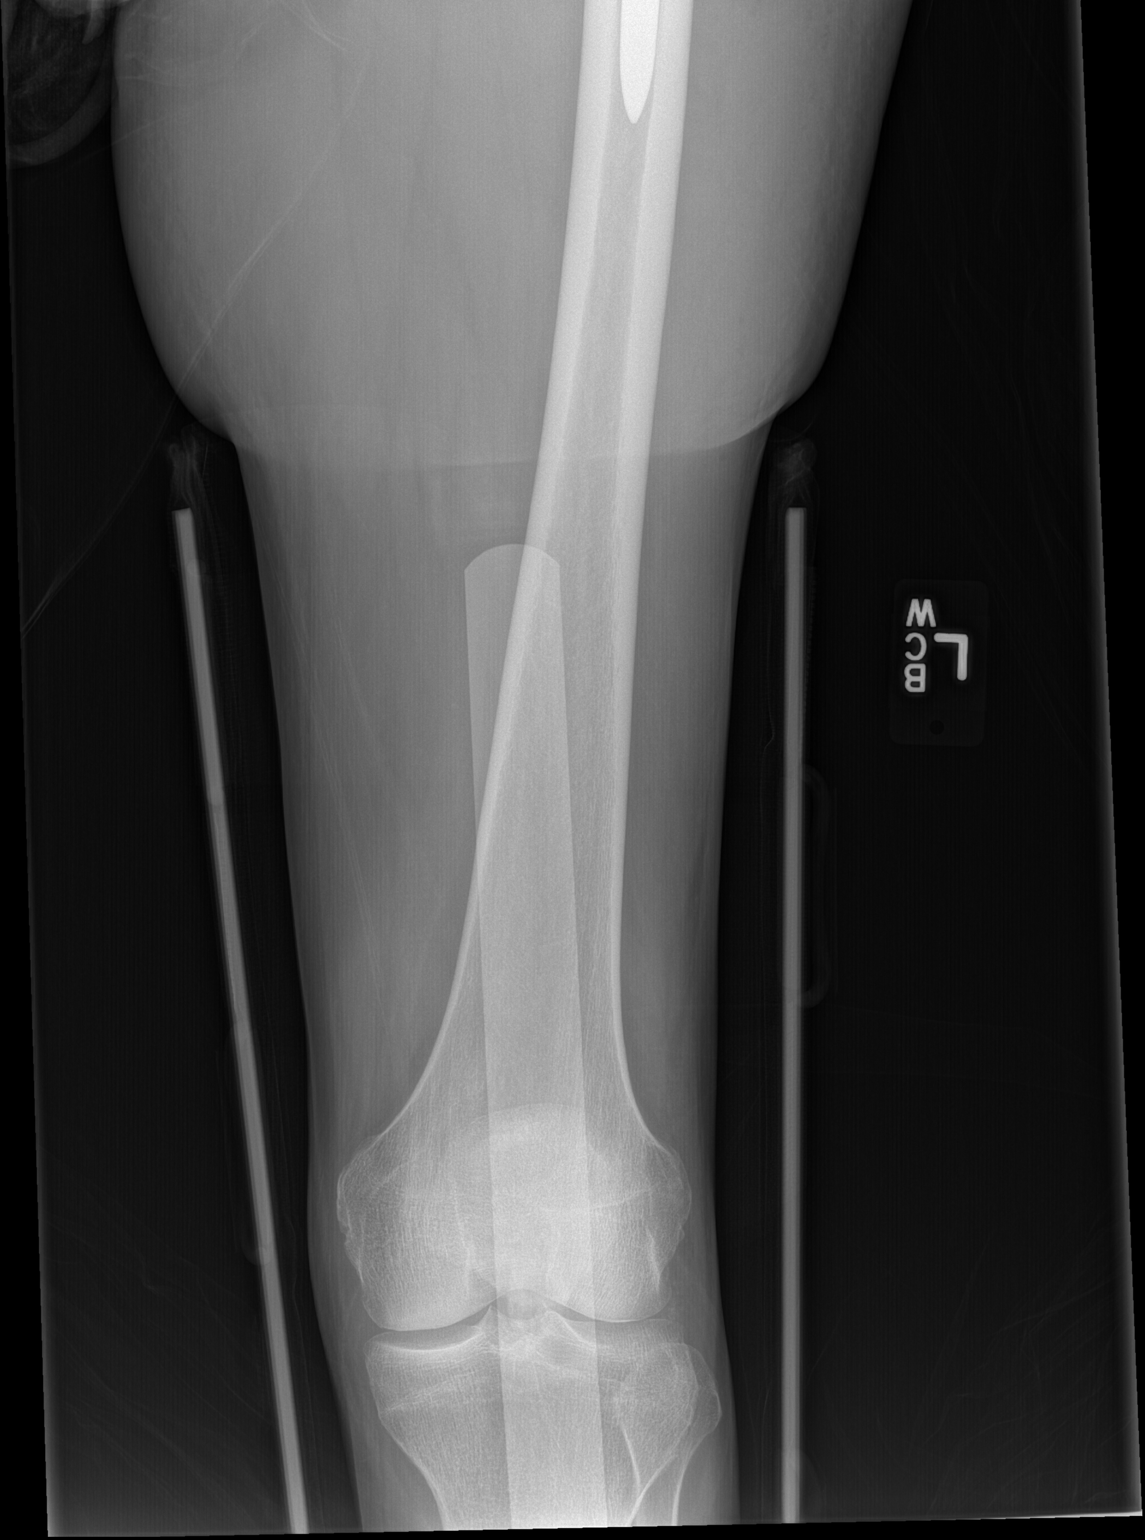

[w femur distal lat left]
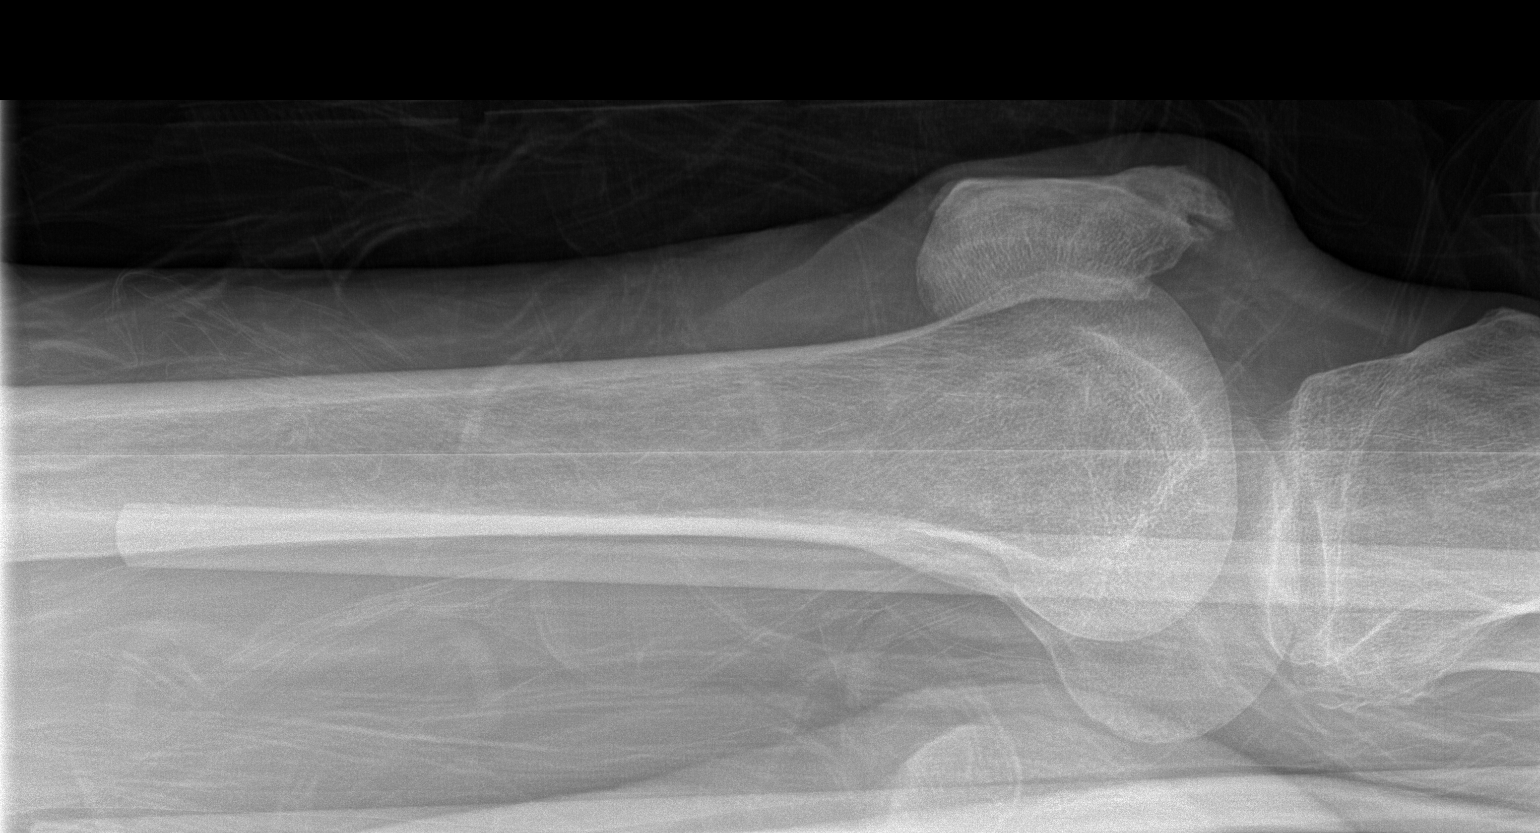

[4 of 4 positions shown; findings below may reference images not displayed]

FINDINGS: No fracture. Left hip prosthesis appears well seated and well
aligned with the acetabulum. No evidence of prosthetic loosening.

Knee joint is normally spaced and aligned.

There is postoperative soft tissue swelling lateral to the left hip
with overlying skin staples. Soft tissues otherwise unremarkable.
IMPRESSION: 1. No acute fracture or dislocation. No evidence of disruption of
the left hip prosthesis.

## 2018-07-26 IMAGING — CT CT HEAD W/O CM
3 of 4 series · 15 of 47 positions shown, 18 images · non-contrast
Comparison: 09/06/2016

CLINICAL DATA: s/p unwitnessed fall, ... Staff reported pt fell out
of bed. Bed is very low to the ground. Pt has splint brace on left
leg. Pt has dementia and Hx HTN.

EXAM:
CT HEAD WITHOUT CONTRAST
TECHNIQUE: Contiguous axial images were obtained from the base of the skull
through the vertex without intravenous contrast.

[Series 2: head w/o · axial · non-contrast · 0.45mm/px · z∈[-133,-13]mm · 9 of 30 slices shown, 12 images]
[im 3/30  brain]
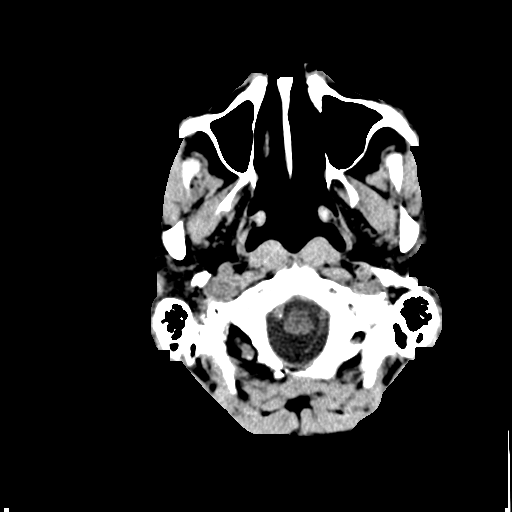
[im 3/30  bone]
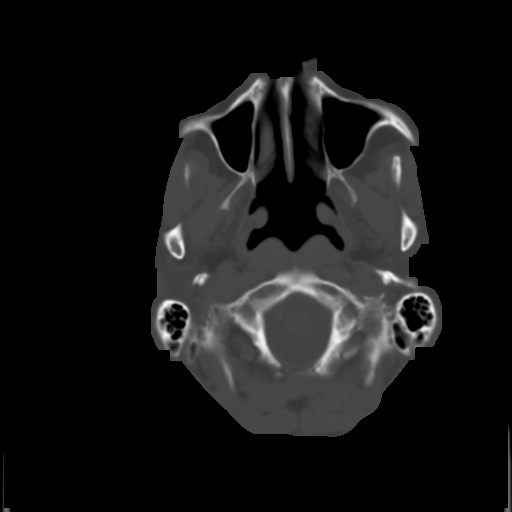
[im 6/30  brain]
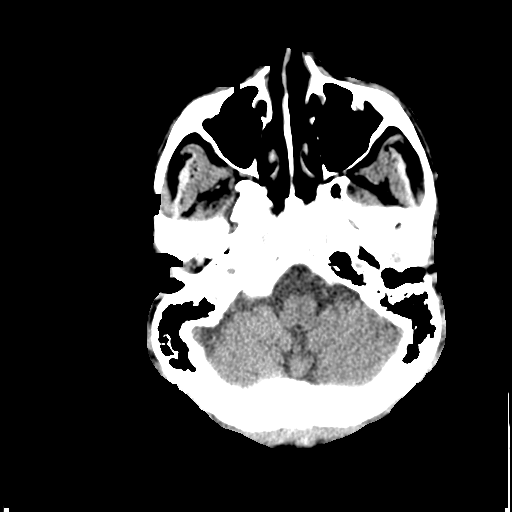
[im 9/30  brain]
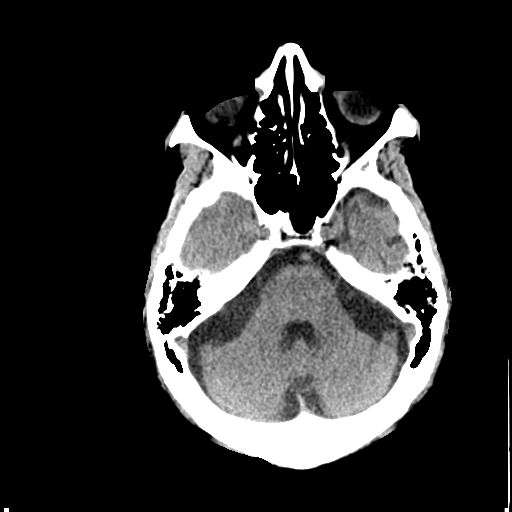
[im 12/30  brain]
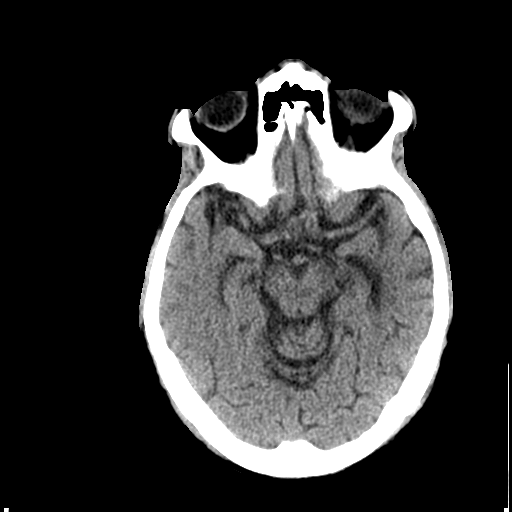
[im 15/30  brain]
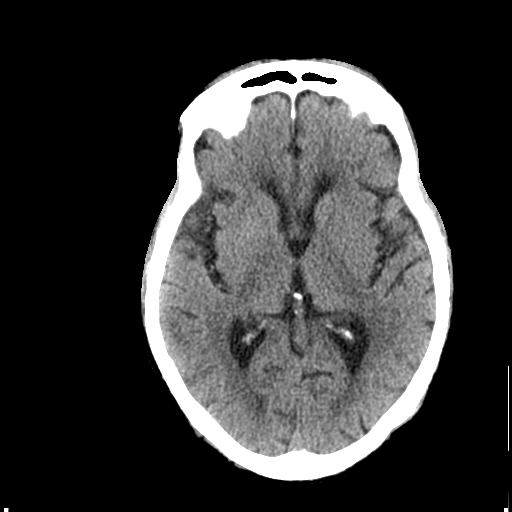
[im 15/30  bone]
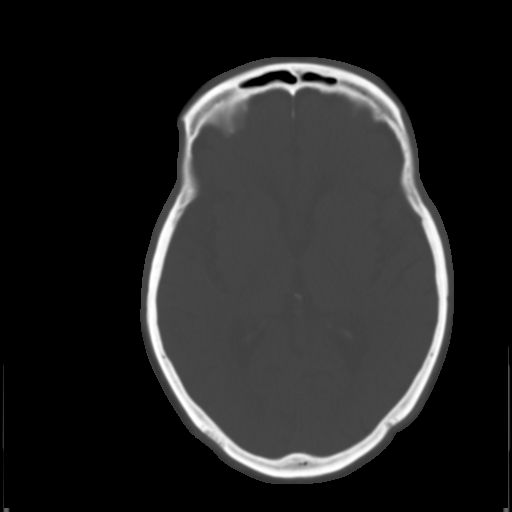
[im 18/30  brain]
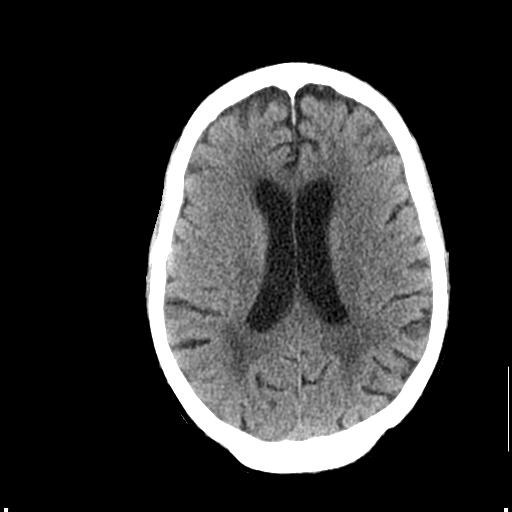
[im 21/30  brain]
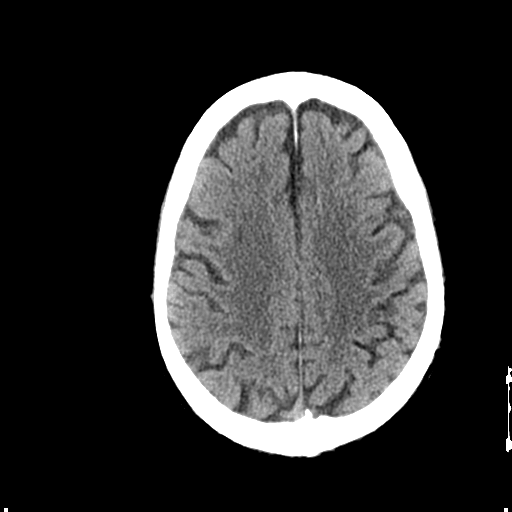
[im 24/30  brain]
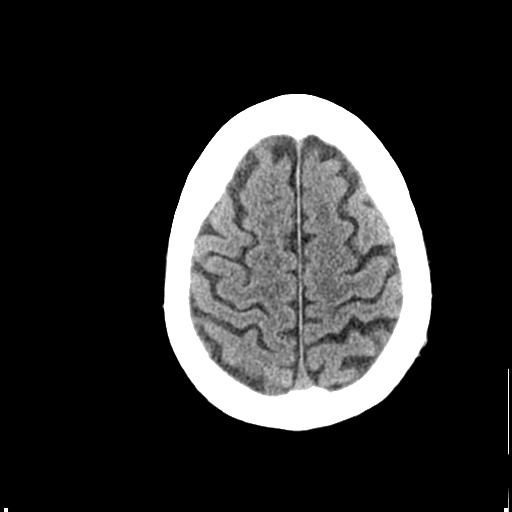
[im 27/30  brain]
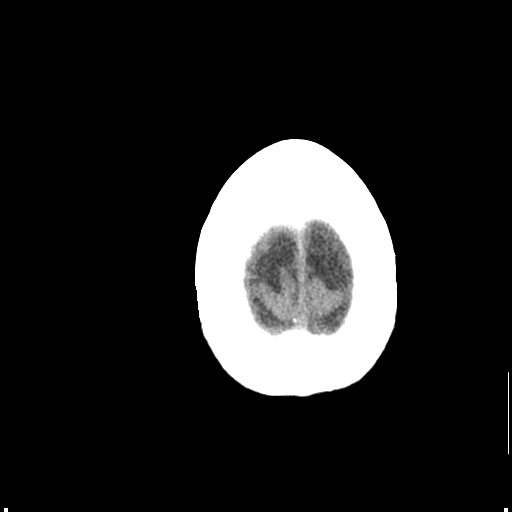
[im 27/30  bone]
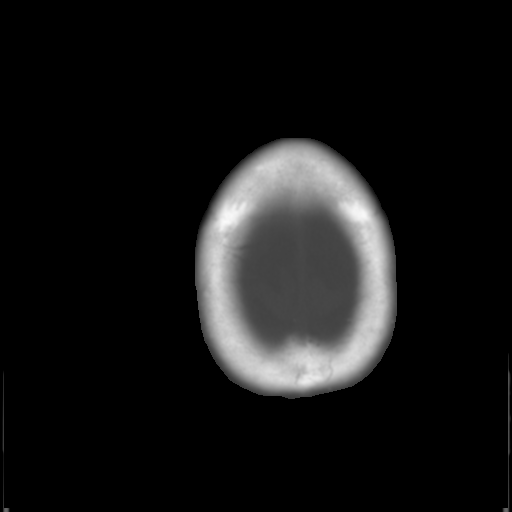

[Series 5: coronal · coronal · 0.29mm/px · 3 of 62 slices shown]
[im 21/62  brain]
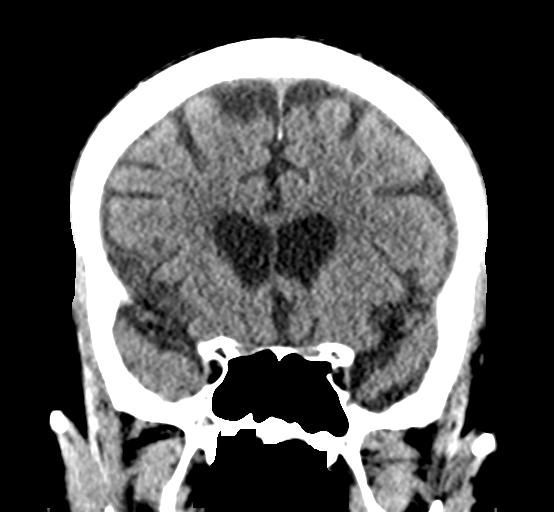
[im 28/62  brain]
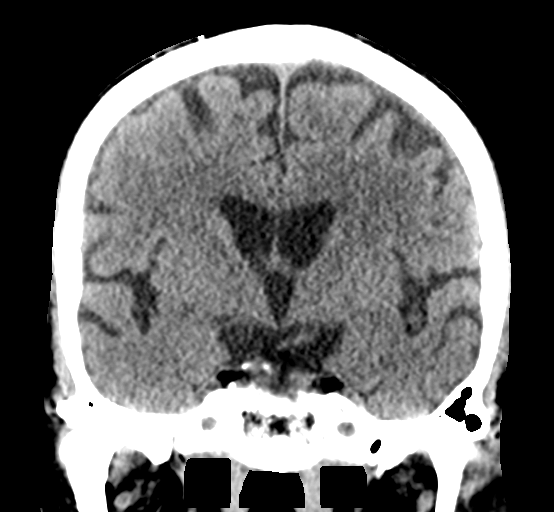
[im 34/62  brain]
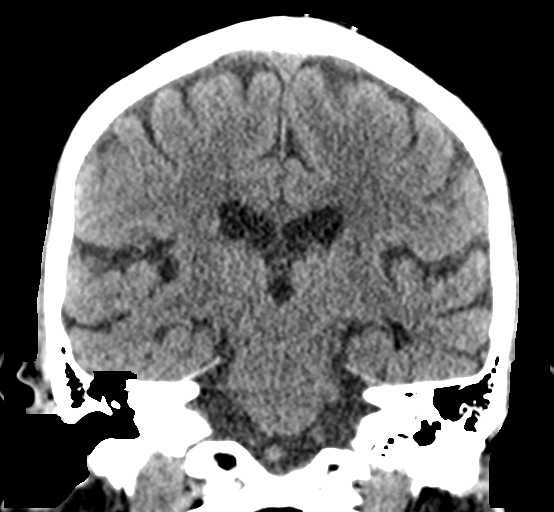

[Series 6: sagittal · sagittal · 0.30mm/px · 3 of 47 slices shown]
[im 16/47  brain]
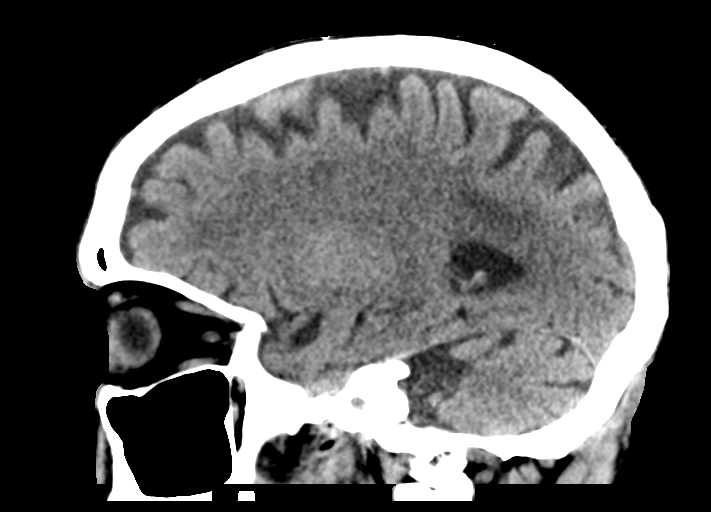
[im 24/47  brain]
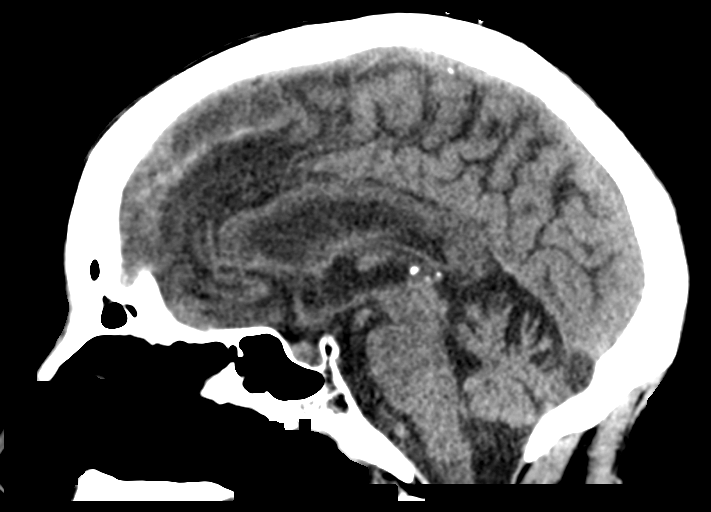
[im 31/47  brain]
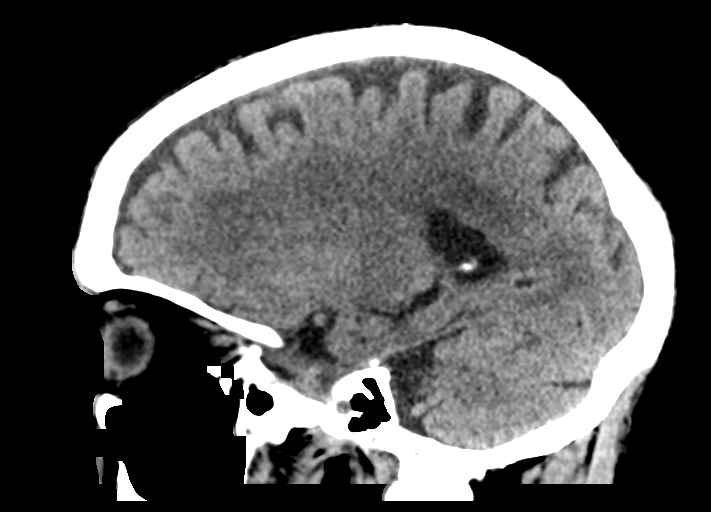

[15 of 47 positions shown; findings below may reference images not displayed]

FINDINGS: Brain: No evidence of acute infarction, hemorrhage, hydrocephalus,
extra-axial collection or mass lesion/mass effect.

There is ventricular and sulcal enlargement reflecting age related
volume loss. Patchy white matter hypoattenuation is noted consistent
with moderate chronic microvascular ischemic change. This is stable.

Vascular: No hyperdense vessel or unexpected calcification.

Skull: Normal. Negative for fracture or focal lesion.

Sinuses/Orbits: Visualize globes and orbits are unremarkable.
Visualized sinuses and mastoid air cells are clear.

Other: None.
IMPRESSION: 1. No acute intracranial abnormalities.
2. Age related volume loss and moderate chronic microvascular
ischemic change. Stable appearance from the prior study.

## 2018-08-30 ENCOUNTER — Telehealth: Payer: Self-pay | Admitting: *Deleted

## 2018-08-30 NOTE — Telephone Encounter (Signed)
Noted thank you

## 2018-08-30 NOTE — Telephone Encounter (Signed)
Patient daughter, Claudine Mouton called and stated that patient is in [place] down at [facility]. Daughter is requesting refills on patient's medications.  Instructed daughter to have pharmacy fax [place] the refill requests. She agreed.  Also informed daughter that patient is needing an appointment because he hasn't been seen since 10/2017 and has canceled 2 appointments. She stated that once this virus is over she will schedule an appointment for patient to be seen.

## 2019-05-06 ENCOUNTER — Telehealth: Payer: Self-pay | Admitting: *Deleted

## 2019-05-06 NOTE — Telephone Encounter (Signed)
Yes the on site doctor will need to write order and if they are seeing pt we will need to remove ourselves from his chart

## 2019-05-06 NOTE — Telephone Encounter (Signed)
Daughter calling stating that pt needs a new order for wheelchair. I advised that pt was last seen in 10/2017 and would need to be seen, per daughter pt lives in a facility and WILL NOT be coming to appt at [facility]. Per daughter pt is seeing the on site physician there. I then advised that the on site physician will need to write the order for the wheelchair.

## 2019-06-19 ENCOUNTER — Emergency Department (HOSPITAL_COMMUNITY)
Admission: EM | Admit: 2019-06-19 | Discharge: 2019-06-20 | Disposition: A | Discharge status: Home / Self Care | Payer: Medicare Other | Attending: Emergency Medicine | Admitting: Emergency Medicine

## 2019-06-19 ENCOUNTER — Encounter (HOSPITAL_COMMUNITY): Payer: Self-pay

## 2019-06-19 ENCOUNTER — Emergency Department (HOSPITAL_COMMUNITY): Payer: Medicare Other

## 2019-06-19 ENCOUNTER — Other Ambulatory Visit: Payer: Self-pay

## 2019-06-19 DIAGNOSIS — N183 Chronic kidney disease, stage 3 unspecified: Secondary | ICD-10-CM | POA: Diagnosis not present

## 2019-06-19 DIAGNOSIS — Z7982 Long term (current) use of aspirin: Secondary | ICD-10-CM | POA: Diagnosis not present

## 2019-06-19 DIAGNOSIS — R52 Pain, unspecified: Secondary | ICD-10-CM

## 2019-06-19 DIAGNOSIS — I4891 Unspecified atrial fibrillation: Secondary | ICD-10-CM | POA: Insufficient documentation

## 2019-06-19 DIAGNOSIS — Y939 Activity, unspecified: Secondary | ICD-10-CM | POA: Insufficient documentation

## 2019-06-19 DIAGNOSIS — I2510 Atherosclerotic heart disease of native coronary artery without angina pectoris: Secondary | ICD-10-CM | POA: Insufficient documentation

## 2019-06-19 DIAGNOSIS — Y999 Unspecified external cause status: Secondary | ICD-10-CM | POA: Insufficient documentation

## 2019-06-19 DIAGNOSIS — R296 Repeated falls: Secondary | ICD-10-CM | POA: Diagnosis not present

## 2019-06-19 DIAGNOSIS — K5900 Constipation, unspecified: Secondary | ICD-10-CM | POA: Diagnosis not present

## 2019-06-19 DIAGNOSIS — S0990XA Unspecified injury of head, initial encounter: Secondary | ICD-10-CM

## 2019-06-19 DIAGNOSIS — Y92122 Bedroom in nursing home as the place of occurrence of the external cause: Secondary | ICD-10-CM | POA: Diagnosis not present

## 2019-06-19 DIAGNOSIS — Z79899 Other long term (current) drug therapy: Secondary | ICD-10-CM | POA: Diagnosis not present

## 2019-06-19 DIAGNOSIS — I129 Hypertensive chronic kidney disease with stage 1 through stage 4 chronic kidney disease, or unspecified chronic kidney disease: Secondary | ICD-10-CM | POA: Diagnosis not present

## 2019-06-19 DIAGNOSIS — W06XXXA Fall from bed, initial encounter: Secondary | ICD-10-CM | POA: Insufficient documentation

## 2019-06-19 DIAGNOSIS — S0011XA Contusion of right eyelid and periocular area, initial encounter: Secondary | ICD-10-CM | POA: Diagnosis not present

## 2019-06-19 DIAGNOSIS — F0390 Unspecified dementia without behavioral disturbance: Secondary | ICD-10-CM | POA: Diagnosis not present

## 2019-06-19 MED ORDER — LORAZEPAM 2 MG/ML IJ SOLN
0.5000 mg | Freq: Once | INTRAMUSCULAR | Status: AC
  Administered 2019-06-19: 0.5 mg via INTRAVENOUS
  Filled 2019-06-19: qty 1

## 2019-06-19 MED ORDER — LORAZEPAM 2 MG/ML IJ SOLN
0.5000 mg | INTRAMUSCULAR | Status: DC | PRN
  Administered 2019-06-19: 09:00:00 0.5 mg via INTRAVENOUS
  Filled 2019-06-19: qty 1

## 2019-06-19 MED ORDER — POLYETHYLENE GLYCOL 3350 17 G PO PACK: 17.0000 g | PACK | Freq: Every day | ORAL | 0 refills | Status: AC

## 2019-06-19 MED ORDER — LORAZEPAM 2 MG/ML IJ SOLN
1.0000 mg | Freq: Once | INTRAMUSCULAR | Status: AC
  Administered 2019-06-19: 1 mg via INTRAMUSCULAR
  Filled 2019-06-19: qty 1

## 2019-06-19 MED ORDER — ACETAMINOPHEN 500 MG PO TABS
1000.0000 mg | ORAL_TABLET | Freq: Once | ORAL | Status: AC
  Administered 2019-06-19: 1000 mg via ORAL
  Filled 2019-06-19: qty 2

## 2019-06-19 NOTE — ED Notes (Signed)
[facility] updated on POC.

## 2019-06-19 NOTE — ED Triage Notes (Signed)
Pt bib [facility] from [facility] after witnessed fall. Pt fell out of bed and hit head on dresser. Hematoma to R eye area. No LOC, pt is not on blood thinners. AOx1. Pt has dementia hx and is at his neuro baseline per EMS. EMS VSS BP 158/55 RR 18 HR 63 CBG 122 SPO2 96% on RA

## 2019-06-19 NOTE — ED Notes (Signed)
[facility] Called for transport 

## 2019-06-19 NOTE — ED Notes (Signed)
[place] daughter

## 2019-06-19 NOTE — ED Notes (Signed)
Patient transported to CT 

## 2019-06-19 NOTE — ED Notes (Signed)
Pt transferred with [facility] back to facility

## 2019-06-19 NOTE — ED Notes (Signed)
Pt transported to MRI 

## 2019-06-19 NOTE — ED Notes (Signed)
[facility] notified that pt is being discharged from hospital and coming back to facility

## 2019-06-19 NOTE — ED Provider Notes (Signed)
Pt was initially seen by Dr Dolly Rias.  Please see his note.  Pt was sent to the ED for evaluation after a fall.  CT scans with question of c spine injury.  MRI negative for acute injury.  Stable for discharge.  Xray shows constipation. MRI results as below.  IMPRESSION: 1. No acute findings or explanation for the patient's symptoms. No evidence of acute injury of the anterior arch of C1. 2. Previous C3-7 laminectomy and fusion. No residual spinal stenosis or cord deformity. 3. 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  Contacted son and notified him of the results.  Pt appears stable for discharge.   Dorie Rank, MD 06/19/19 1426

## 2019-06-19 NOTE — ED Notes (Signed)
[phone] Wayne Fuller son wants an update

## 2019-06-19 NOTE — ED Notes (Signed)
[place] care giver at [facility] living home . Wants updates

## 2019-06-19 NOTE — Discharge Instructions (Signed)
Ice to help with swelling, tylenol as needed for pain, follow up with your doctor, consider taking a stool softener to help with constipation

## 2019-06-20 ENCOUNTER — Emergency Department (HOSPITAL_COMMUNITY)
Admission: EM | Admit: 2019-06-20 | Discharge: 2019-06-20 | Disposition: A | Discharge status: Home / Self Care | Payer: Medicare Other | Source: Home / Self Care | Attending: Emergency Medicine | Admitting: Emergency Medicine

## 2019-06-20 ENCOUNTER — Other Ambulatory Visit: Payer: Self-pay

## 2019-06-20 ENCOUNTER — Encounter (HOSPITAL_COMMUNITY): Payer: Self-pay

## 2019-06-20 DIAGNOSIS — E860 Dehydration: Secondary | ICD-10-CM

## 2019-06-20 DIAGNOSIS — Z79899 Other long term (current) drug therapy: Secondary | ICD-10-CM | POA: Insufficient documentation

## 2019-06-20 DIAGNOSIS — Z87891 Personal history of nicotine dependence: Secondary | ICD-10-CM | POA: Insufficient documentation

## 2019-06-20 DIAGNOSIS — W19XXXA Unspecified fall, initial encounter: Secondary | ICD-10-CM

## 2019-06-20 DIAGNOSIS — S0011XA Contusion of right eyelid and periocular area, initial encounter: Secondary | ICD-10-CM | POA: Diagnosis not present

## 2019-06-20 DIAGNOSIS — I2510 Atherosclerotic heart disease of native coronary artery without angina pectoris: Secondary | ICD-10-CM | POA: Insufficient documentation

## 2019-06-20 DIAGNOSIS — Z7982 Long term (current) use of aspirin: Secondary | ICD-10-CM | POA: Insufficient documentation

## 2019-06-20 DIAGNOSIS — Z043 Encounter for examination and observation following other accident: Secondary | ICD-10-CM | POA: Insufficient documentation

## 2019-06-20 DIAGNOSIS — F0390 Unspecified dementia without behavioral disturbance: Secondary | ICD-10-CM | POA: Insufficient documentation

## 2019-06-20 DIAGNOSIS — I129 Hypertensive chronic kidney disease with stage 1 through stage 4 chronic kidney disease, or unspecified chronic kidney disease: Secondary | ICD-10-CM | POA: Insufficient documentation

## 2019-06-20 DIAGNOSIS — N184 Chronic kidney disease, stage 4 (severe): Secondary | ICD-10-CM | POA: Insufficient documentation

## 2019-06-20 LAB — CBC WITH DIFFERENTIAL/PLATELET
Abs Immature Granulocytes: 0.19 10*3/uL — ABNORMAL HIGH (ref 0.00–0.07)
Basophils Absolute: 0 10*3/uL (ref 0.0–0.1)
Basophils Relative: 0 %
Eosinophils Absolute: 0 10*3/uL (ref 0.0–0.5)
Eosinophils Relative: 0 %
HCT: 33.8 % — ABNORMAL LOW (ref 39.0–52.0)
Hemoglobin: 10.4 g/dL — ABNORMAL LOW (ref 13.0–17.0)
Immature Granulocytes: 3 %
Lymphocytes Relative: 7 %
Lymphs Abs: 0.5 10*3/uL — ABNORMAL LOW (ref 0.7–4.0)
MCH: 32.1 pg (ref 26.0–34.0)
MCHC: 30.8 g/dL (ref 30.0–36.0)
MCV: 104.3 fL — ABNORMAL HIGH (ref 80.0–100.0)
Monocytes Absolute: 0.2 10*3/uL (ref 0.1–1.0)
Monocytes Relative: 3 %
Neutro Abs: 5.8 10*3/uL (ref 1.7–7.7)
Neutrophils Relative %: 87 %
Platelets: 174 10*3/uL (ref 150–400)
RBC: 3.24 MIL/uL — ABNORMAL LOW (ref 4.22–5.81)
RDW: 14.8 % (ref 11.5–15.5)
WBC: 6.6 10*3/uL (ref 4.0–10.5)
nRBC: 0 % (ref 0.0–0.2)

## 2019-06-20 LAB — URINALYSIS, ROUTINE W REFLEX MICROSCOPIC
Bacteria, UA: NONE SEEN
Bilirubin Urine: NEGATIVE
Glucose, UA: NEGATIVE mg/dL
Ketones, ur: NEGATIVE mg/dL
Leukocytes,Ua: NEGATIVE
Nitrite: NEGATIVE
Protein, ur: 100 mg/dL — AB
Specific Gravity, Urine: 1.013 (ref 1.005–1.030)
pH: 5 (ref 5.0–8.0)

## 2019-06-20 LAB — BASIC METABOLIC PANEL
Anion gap: 16 — ABNORMAL HIGH (ref 5–15)
BUN: 35 mg/dL — ABNORMAL HIGH (ref 8–23)
CO2: 20 mmol/L — ABNORMAL LOW (ref 22–32)
Calcium: 8.9 mg/dL (ref 8.9–10.3)
Chloride: 108 mmol/L (ref 98–111)
Creatinine, Ser: 2.6 mg/dL — ABNORMAL HIGH (ref 0.61–1.24)
GFR calc Af Amer: 24 mL/min — ABNORMAL LOW (ref >60)
GFR calc non Af Amer: 20 mL/min — ABNORMAL LOW (ref >60)
Glucose, Bld: 98 mg/dL (ref 70–99)
Potassium: 4.4 mmol/L (ref 3.5–5.1)
Sodium: 144 mmol/L (ref 135–145)

## 2019-06-20 NOTE — ED Provider Notes (Signed)
  Physical Exam  BP (!) 158/80   Pulse 76   Temp 97.9 F (36.6 C) (Axillary)   Resp 18   SpO2 100%   Physical Exam  ED Course/Procedures     Procedures  MDM  Received patient in signout.  More falls.  No new trauma seen.  Urine does not show infection.  Creatinine mildly elevated but appears to be close to baseline.  May have a component of dehydration.  Will encourage oral intake.  Discharge back to nursing home.  Attempted to contact family, however no answer at number listed.       Davonna Belling, MD 06/20/19 [phone]

## 2019-06-20 NOTE — ED Notes (Signed)
Labeled labs sent  Condom catheter placed on pt to collect urine

## 2019-06-20 NOTE — ED Notes (Signed)
Care endorsed to [place], RN

## 2019-06-20 NOTE — ED Notes (Signed)
Received notice that labs hemolyzed. Labs redrawn, labeled with 2 pt identifiers, and sent to lab Pt remains on cart in NAD. Breathing easy, non-labored. Equal rise and fall of chest noted

## 2019-06-20 NOTE — ED Triage Notes (Signed)
Pt brought in by EMS from [facility]. Pt has hx of dementia and had an unwitnessed fall tonight. Pt A&Ox1 baseline. Pt not on blood thinners. Hx of covid but tested negative on 1/12. Pt with R eye swelling. Pt seen here yesterday for the same complaint. Hematoma present to forehead is from previous fall.

## 2019-06-20 NOTE — Discharge Instructions (Signed)
Encourage oral fluids.  You need a little extra hydration.

## 2019-06-20 NOTE — ED Notes (Signed)
Pt straight cathed under sterile procedure without incidence. 100 cc of urine output. Urine labeled with 2 pt identifiers and sent to lab Per Dr. Christy Gentles, ok for pt to be off monitors at this time

## 2019-06-20 NOTE — ED Provider Notes (Signed)
[facility] EMERGENCY DEPARTMENT Provider Note   CSN: [id number] Arrival date & time: 06/20/19  [id number]     History Chief Complaint  Patient presents with  . Fall   Level 5 caveat due to dementia Wayne Fuller is a 86 y.o. male.  The history is provided by the nursing home and the patient. The history is limited by the condition of the patient.  Fall This is a new problem. Nothing aggravates the symptoms. Nothing relieves the symptoms.   Patient history dementia, chronic kidney disease, hypertension presents with fall.  Patient lives at a local nursing facility.  Patient had an unwitnessed fall.  It is reported he was found sitting on the floor by his wheelchair.  Patient is not on anticoagulants.  Patient seen in the ER recently for a fall and had a hematoma to his forehead.    Past Medical History:  Diagnosis Date  . BPH (benign prostatic hyperplasia)   . Chronic kidney disease    ckd stage 3  . Dementia ([place])   . GERD (gastroesophageal reflux disease)   . Hemorrhoids   . History of angina   . Hyperlipemia   . Hypertension   . Irregular heart rhythm   . Paroxysmal A-fib ([place]) 04/02/2015    Patient Active Problem List   Diagnosis Date Noted  . HCAP (healthcare-associated pneumonia) 04/22/2018  . Hypoglycemia 04/22/2018  . S/p left hip fracture 09/29/2016  . Dehydration   . 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 Dislocation of internal left hip prosthesis ([place]) 09/21/2016  . Hyperkalemia 09/21/2016  . Protein-calorie malnutrition, severe 09/21/2016  . Anemia, chronic disease 01/30/2016  . Acute kidney injury superimposed on CKD ([place]) 01/30/2016  . Dementia with behavioral disturbance ([place]) 01/19/2016  . Back pain 01/19/2016  . Elevated troponin 04/02/2015  . Paroxysmal A-fib ([place]) 04/02/2015  . CKD (chronic kidney disease) stage 4, GFR 15-29 ml/min ([facility]) 08/11/2014  . GERD (gastroesophageal reflux disease)  08/11/2014  . Essential hypertension 04/29/2009  . CAD (coronary artery disease) 04/29/2009  . TOBACCO ABUSE, HX OF 04/29/2009    Past Surgical History:  Procedure Laterality Date  . BACK SURGERY    . CERVICAL SPINE SURGERY    . SPINE SURGERY         Family History  Problem Relation Age of Onset  . Stroke Mother   . Hypertension Sister   . Other Son        Homicide, stabbed  . Stroke Son   . Hypertension Son   . Diabetes Son   . Hypertension Son     Social History   Tobacco Use  . Smoking status: Former Smoker    Packs/day: 0.50    Years: 10.00    Pack years: 5.00    Types: Cigarettes  . Smokeless tobacco: Never Used  . Tobacco comment: quit smoking  around 1996  Substance Use Topics  . Alcohol use: No    Comment: hx of occasional ETOH use  . Drug use: No    Home Medications Prior to Admission medications   Medication Sig Start Date End Date Taking? Authorizing Provider  acetaminophen (TYLENOL) 500 MG tablet Take 500 mg by mouth every 6 (six) hours as needed for headache (minor discomfort, fever 99.5-101F).    [provider]  alum & mag hydroxide-simeth ([place]) 200-200-20 MG/5ML suspension Take 30 mLs by mouth every 6 (six) hours as needed for indigestion or heartburn.    [provider]  amiodarone (PACERONE) 200 MG tablet Take 1 tablet (200 mg total) by mouth daily. 10/20/16   [name], NP  amLODipine (NORVASC) 10 MG tablet Take 1 tablet (10 mg total) by mouth daily. 10/20/16   [name], NP  amoxicillin-clavulanate (AUGMENTIN) 500-125 MG tablet Take 1 tablet (500 mg total) by mouth every 12 (twelve) hours. Last dose on 12/7 evening Patient not taking: Reported on 06/19/2019 04/27/18   Jonetta Osgood, MD  aspirin EC 81 MG tablet Take 81 mg by mouth daily. For CAD    [provider]  cyanocobalamin 1000 MCG tablet Take 1,000 mcg by mouth daily. Vitamin B12    [provider]  ferrous sulfate 325 (65 FE) MG  tablet Take 325 mg by mouth daily. For Anemia    [provider]  guaifenesin (ROBAFEN) 100 MG/5ML syrup Take 200 mg by mouth every 6 (six) hours as needed for cough.    [provider]  hydrALAZINE (APRESOLINE) 100 MG tablet Take 1 tablet (100 mg total) by mouth 3 (three) times daily. 10/20/16   [name], NP  loperamide (IMODIUM) 2 MG capsule Take 2 mg by mouth as needed for diarrhea or loose stools (not to exceed 8 doses in 24 hours).    [provider]  LORazepam (ATIVAN) 0.5 MG tablet Take 1 tablet (0.5 mg total) by mouth every 8 (eight) hours as needed (agitation). 04/27/18   Ghimire, Henreitta Leber, MD  magnesium hydroxide (MILK OF MAGNESIA) 400 MG/5ML suspension Take 30 mLs by mouth daily as needed for mild constipation.    [provider]  Melatonin 3 MG TABS Take 3 mg by mouth at bedtime. For sleep    [provider]  memantine (NAMENDA) 5 MG tablet Take 5 mg by mouth daily. For Dementia    [provider]  metoprolol succinate (TOPROL-XL) 25 MG 24 hr tablet Take 1 tablet (25 mg total) by mouth daily. 08/24/16   [name], NP  Neomycin-Bacitracin-Polymyxin (TRIPLE ANTIBIOTIC) [phone] OINT Apply 1 application topically 4 (four) times daily as needed (minor skin tears or abrasions). Clean area with normal saline, apply triple antibiotic, cover with bandaid or gauze and tape, change as needed until healed.    [provider]  omeprazole (PRILOSEC) 20 MG capsule Take 20 mg by mouth daily at 6 (six) AM. For GERD    [provider]  polyethylene glycol (MIRALAX) 17 g packet Take 17 g by mouth daily. 06/19/19   Dorie Rank, MD  QUEtiapine (SEROQUEL) 25 MG tablet Take 0.5 tablets (12.5 mg total) by mouth at bedtime. 04/27/18   Ghimire, Henreitta Leber, MD  sodium bicarbonate 650 MG tablet Take 1 tablet (650 mg total) by mouth 2 (two) times daily. 09/22/16   Barton Dubois, MD  tamsulosin (FLOMAX) 0.4 MG CAPS capsule TAKE 1  CAPSULE(0.4 MG) BY MOUTH DAILY AFTER SUPPER Patient taking differently: Take 0.4 mg by mouth at bedtime.  06/21/16   [name], NP  traMADol (ULTRAM) 50 MG tablet Take 1 tablet (50 mg total) by mouth every 6 (six) hours as needed for moderate pain. Patient not taking: Reported on 06/19/2019 04/27/18   Jonetta Osgood, MD    Allergies    Patient has no known allergies.  Review of Systems   Review of Systems  Unable to perform ROS: Dementia    Physical Exam Updated Vital Signs BP (!) 158/80   Pulse 80   Temp 97.9 F (36.6 C) (Axillary)  Resp 17   SpO2 100%   Physical Exam CONSTITUTIONAL: Elderly, no acute distress HEAD: Hematoma noted to right forehead, right periorbital hematoma noted EYES: EOMI/PERRL, right periorbital hematoma noted, but no evidence of eye injury.  Pupils are equal and reactive.  No traumatic hyphema noted ENMT: Mucous membranes moist, no signs of facial trauma NECK: supple no meningeal signs SPINE/BACK:entire spine nontender, no bruising/crepitance/stepoffs noted to spine CV: S1/S2 noted LUNGS: Lungs are clear to auscultation bilaterally, no apparent distress Chest - no tenderness ABDOMEN: soft, nontender NEURO: Pt is awake/alert, moves all extremities x4, patient appears confused EXTREMITIES: pulses normal/equal, full ROM, pelvis stable, all other extremities/joints palpated/ranged and nontender SKIN: warm, color normal  ED Results / Procedures / Treatments   Labs (all labs ordered are listed, but only abnormal results are displayed) Labs Reviewed  CBC WITH DIFFERENTIAL/PLATELET - Abnormal; Notable for the following components:      Result Value   RBC 3.24 (*)    Hemoglobin 10.4 (*)    HCT 33.8 (*)    MCV 104.3 (*)    Lymphs Abs 0.5 (*)    Abs Immature Granulocytes 0.19 (*)    All other components within normal limits  BASIC METABOLIC PANEL  URINALYSIS, ROUTINE W REFLEX MICROSCOPIC    EKG EKG Interpretation  Date/Time:  Thursday  June 20 2019 05:26:12 EST Ventricular Rate:  75 PR Interval:    QRS Duration: 110 QT Interval:  391 QTC Calculation: 437 R Axis:   -47 Text Interpretation: Sinus rhythm Short PR interval LAD, consider left anterior fascicular block LVH with secondary repolarization abnormality Probable anterior infarct, age indeterminate Interpretation limited secondary to artifact No significant change since last tracing Confirmed by Ripley Fraise [phone]) on 06/20/2019 5:37:39 AM   Radiology DG Chest 1 View  Result Date: 06/19/2019 CLINICAL DATA:  Witnessed fall with positive head strike EXAM: CHEST  1 VIEW COMPARISON:  None. FINDINGS: Hazy opacity in the left lung base likely reflects superimposed soft tissues. No consolidation, features of edema, pneumothorax, or effusion. The aorta is calcified. The remaining cardiomediastinal contours are unremarkable. No acute osseous or soft tissue abnormality. Degenerative changes are present in the imaged spine and shoulders. Partial visualization of cervical fusion hardware. Telemetry leads overlie the chest. IMPRESSION: No acute cardiopulmonary abnormality or traumatic findings in the chest. Electronically Signed   By: Lovena Le M.D.   On: 06/19/2019 06:46   DG Abd 1 View  Result Date: 06/19/2019 CLINICAL DATA:  Witnessed fall EXAM: ABDOMEN - 1 VIEW COMPARISON:  CT abdomen pelvis January 10, 2026, hip radiograph Sep 21, 2016 FINDINGS: Multiple loops of nondilated air-filled small bowel in the mid abdomen. Moderate to large stool burden throughout the colon. No suspicious calcifications over the course of the kidneys or ureters. Lung bases and mediastinum are better assessed on dedicated chest radiograph. Multilevel degenerative changes are present in the spine. There is evidence of prior right femoral transcervical fixation and hip hemiarthroplasty. Some radiodensities projecting about the left femoral stem are incompletely included in the level of imaging and may  reflect hypertrophic changes. If there are further concerns, consider dedicated hip radiographs. IMPRESSION: 1. Moderate to large stool burden throughout the colon. No evidence of high-grade bowel obstruction. 2. Some irregular radiodensity projecting about the left hip are incompletely included in the level of imaging and could be better assessed with dedicated radiographs. Possibly reflecting overlying draping or hypertrophic changes though a traumatic injury is not fully excluded. Electronically Signed   By: March Rummage  [facility] M.D.   On: 06/19/2019 06:50   CT Head Wo Contrast  Result Date: 06/19/2019 CLINICAL DATA:  Witnessed fall from bed with facial strike upon the dresser, right periorbital hematoma EXAM: CT HEAD WITHOUT CONTRAST CT MAXILLOFACIAL WITHOUT CONTRAST CT CERVICAL SPINE WITHOUT CONTRAST TECHNIQUE: Multidetector CT imaging of the head, cervical spine, and maxillofacial structures were performed using the standard protocol without intravenous contrast. Multiplanar CT image reconstructions of the cervical spine and maxillofacial structures were also generated. COMPARISON:  CT head 09/11/2016 FINDINGS: CT HEAD FINDINGS Brain: Suboptimal imaging quality due to patient motion artifact. May limit detection of subtle anomalies. No evidence of acute infarction, hemorrhage, hydrocephalus, extra-axial collection or mass lesion/mass effect. Symmetric prominence of the ventricles, cisterns and sulci compatible with parenchymal volume loss. Patchy areas of white matter hypoattenuation are most compatible with chronic microvascular angiopathy. Vascular: Atherosclerotic calcification of the carotid siphons and intradural vertebral arteries. No hyperdense vessel. Skull: Extensive right frontal and supraorbital scalp swelling with crescentic hematoma measuring up to 1 cm maximal thickness. No definite calvarial fracture though motion artifact may limit detection of nondisplaced fractures. Other: None CT MAXILLOFACIAL  FINDINGS Osseous: Remote fracture left orbital floor. No acute fracture of the bony orbits. Deformity left nasal bone, age indeterminate. Correlate with point tenderness. No other mid face fractures are seen. The pterygoid plates are intact. The mandible is intact. Temporomandibular joints are normally aligned. No temporal bone fractures are identified. Complete absence of the maxillary dentition. Numerous absent mandibular teeth. Multiple carious lesions of the mandibular dentition. No fractured or avulsed teeth. Orbits: Right periorbital soft tissue swelling and palpebral thickening. No retro septal stranding or gas. Bilateral lens extractions are noted. The globes appear normal and symmetric. Symmetric appearance of the extraocular musculature and optic nerve sheath complexes. Normal caliber of the superior ophthalmic veins. Sinuses: Soft tissue thickening within the anterior ethmoids. Remaining paranasal sinuses are predominantly clear. No air-fluid levels. No pneumatized secretions. Middle ear cavities are clear. Debris is noted in the external auditory canal on the left and right. Ossicular chains are normally configured. Mastoid air cells are well aerated, better seen on CT images of the head. Soft tissues: Right periorbital soft tissue swelling and palpebral swelling. Small amount of skin thickening extending to the nasal bridge. Mild pre mandibular soft tissue thickening as well. Correlate for additional site of contusion. CT CERVICAL SPINE FINDINGS Alignment: Preservation of the normal cervical lordosis. Slight retrolisthesis C2 on 3 and C5 on C6 are likely degenerative in nature. Prior C3-4 anterior cervical spinal fusion and long segment fusion the C3-T1 levels. Slight anterior widening of the C1-2 lateral mass articulation. Skull base and vertebrae: There is fragmentation inferior to the anterior arch of C1. No other suspicious osseous lesions concerning for acute injury. There is bony fusion of the  vertebral bodies across the C3-4 levels and bony fusion of the posterior elements from C3-T1. No evidence of hardware fracture or failure. Soft tissues and spinal canal: There is no significant soft tissue thickening adjacent the anterior arch of C1. Calcific pannus formation is noted posterior to the dens. No visible canal hematoma. Some limitations are imposed by streak artifact from patient's cervical fusion hardware. Disc levels: Multilevel cervical spondylitic changes throughout the instrumented levels with multilevel anterior disc osteophyte complexes. There is posterior decompression from the C2-T1 levels.At most mild to moderate residual multilevel neural foraminal narrowing is seen most pronounced at C3-4. Upper chest: Biapical pleuroparenchymal scarring. Lung apices are otherwise clear. Other: Normal thyroid gland. Cervical carotid  atherosclerosis. IMPRESSION: CT head 1. Extensive right frontal and supraorbital scalp swelling with crescentic hematoma measuring up to 1 cm maximal thickness. No definite calvarial fracture though motion artifact may limit detection of nondisplaced fractures. 2. No CT evidence of acute intracranial hemorrhage or other acute intracranial abnormality. 3. Advanced parenchymal atrophy and microvascular angiopathy CT facial bones 1. Deformity of the left nasal bone, age indeterminate. Correlate with point tenderness. No other mid face fractures identified. Remote left orbital floor fracture. 2. Right periorbital soft tissue swelling and palpebral thickening. No retro septal stranding or gas. 3. Small amount of skin thickening anterior to the mandible. Correlate for additional site of contusion. 4. Extensive carious lesions of the dentition, correlate with dental exam. CT cervical spine 1. Fragment seen inferior to the anterior arch of C1 may be degenerative in nature given absence of significant stranding or hemorrhage however given slight anterior widening of the C1-2 lateral mass  articulation, and mechanism of injury, a hyperflexion type injury acute osteophyte fracture cannot be excluded. Consider evaluation with MR to assess for bony edema. 2. Prior C3-4 anterior cervical spinal fusion and long segment fusion C3-T1 with posterior decompression C3-T7. There is bony fusion of the vertebral bodies across the C3-4 levels and bony fusion of the posterior elements from C3-T1. No evidence of hardware fracture or failure. 3. Multilevel spondylitic changes throughout the instrumented levels with multilevel anterior disc osteophyte complexes. There is posterior decompression from the C2-T1 levels. No visible canal hematoma. These results were called by telephone at the time of interpretation on 06/19/2019 at 5:16 am to provider [facility] , who verbally acknowledged these results. Electronically Signed   By: Lovena Le M.D.   On: 06/19/2019 05:16   CT Cervical Spine Wo Contrast  Result Date: 06/19/2019 CLINICAL DATA:  Witnessed fall from bed with facial strike upon the dresser, right periorbital hematoma EXAM: CT HEAD WITHOUT CONTRAST CT MAXILLOFACIAL WITHOUT CONTRAST CT CERVICAL SPINE WITHOUT CONTRAST TECHNIQUE: Multidetector CT imaging of the head, cervical spine, and maxillofacial structures were performed using the standard protocol without intravenous contrast. Multiplanar CT image reconstructions of the cervical spine and maxillofacial structures were also generated. COMPARISON:  CT head 09/11/2016 FINDINGS: CT HEAD FINDINGS Brain: Suboptimal imaging quality due to patient motion artifact. May limit detection of subtle anomalies. No evidence of acute infarction, hemorrhage, hydrocephalus, extra-axial collection or mass lesion/mass effect. Symmetric prominence of the ventricles, cisterns and sulci compatible with parenchymal volume loss. Patchy areas of white matter hypoattenuation are most compatible with chronic microvascular angiopathy. Vascular: Atherosclerotic calcification of the  carotid siphons and intradural vertebral arteries. No hyperdense vessel. Skull: Extensive right frontal and supraorbital scalp swelling with crescentic hematoma measuring up to 1 cm maximal thickness. No definite calvarial fracture though motion artifact may limit detection of nondisplaced fractures. Other: None CT MAXILLOFACIAL FINDINGS Osseous: Remote fracture left orbital floor. No acute fracture of the bony orbits. Deformity left nasal bone, age indeterminate. Correlate with point tenderness. No other mid face fractures are seen. The pterygoid plates are intact. The mandible is intact. Temporomandibular joints are normally aligned. No temporal bone fractures are identified. Complete absence of the maxillary dentition. Numerous absent mandibular teeth. Multiple carious lesions of the mandibular dentition. No fractured or avulsed teeth. Orbits: Right periorbital soft tissue swelling and palpebral thickening. No retro septal stranding or gas. Bilateral lens extractions are noted. The globes appear normal and symmetric. Symmetric appearance of the extraocular musculature and optic nerve sheath complexes. Normal caliber of the superior ophthalmic veins.  Sinuses: Soft tissue thickening within the anterior ethmoids. Remaining paranasal sinuses are predominantly clear. No air-fluid levels. No pneumatized secretions. Middle ear cavities are clear. Debris is noted in the external auditory canal on the left and right. Ossicular chains are normally configured. Mastoid air cells are well aerated, better seen on CT images of the head. Soft tissues: Right periorbital soft tissue swelling and palpebral swelling. Small amount of skin thickening extending to the nasal bridge. Mild pre mandibular soft tissue thickening as well. Correlate for additional site of contusion. CT CERVICAL SPINE FINDINGS Alignment: Preservation of the normal cervical lordosis. Slight retrolisthesis C2 on 3 and C5 on C6 are likely degenerative in nature.  Prior C3-4 anterior cervical spinal fusion and long segment fusion the C3-T1 levels. Slight anterior widening of the C1-2 lateral mass articulation. Skull base and vertebrae: There is fragmentation inferior to the anterior arch of C1. No other suspicious osseous lesions concerning for acute injury. There is bony fusion of the vertebral bodies across the C3-4 levels and bony fusion of the posterior elements from C3-T1. No evidence of hardware fracture or failure. Soft tissues and spinal canal: There is no significant soft tissue thickening adjacent the anterior arch of C1. Calcific pannus formation is noted posterior to the dens. No visible canal hematoma. Some limitations are imposed by streak artifact from patient's cervical fusion hardware. Disc levels: Multilevel cervical spondylitic changes throughout the instrumented levels with multilevel anterior disc osteophyte complexes. There is posterior decompression from the C2-T1 levels.At most mild to moderate residual multilevel neural foraminal narrowing is seen most pronounced at C3-4. Upper chest: Biapical pleuroparenchymal scarring. Lung apices are otherwise clear. Other: Normal thyroid gland. Cervical carotid atherosclerosis. IMPRESSION: CT head 1. Extensive right frontal and supraorbital scalp swelling with crescentic hematoma measuring up to 1 cm maximal thickness. No definite calvarial fracture though motion artifact may limit detection of nondisplaced fractures. 2. No CT evidence of acute intracranial hemorrhage or other acute intracranial abnormality. 3. Advanced parenchymal atrophy and microvascular angiopathy CT facial bones 1. Deformity of the left nasal bone, age indeterminate. Correlate with point tenderness. No other mid face fractures identified. Remote left orbital floor fracture. 2. Right periorbital soft tissue swelling and palpebral thickening. No retro septal stranding or gas. 3. Small amount of skin thickening anterior to the mandible.  Correlate for additional site of contusion. 4. Extensive carious lesions of the dentition, correlate with dental exam. CT cervical spine 1. Fragment seen inferior to the anterior arch of C1 may be degenerative in nature given absence of significant stranding or hemorrhage however given slight anterior widening of the C1-2 lateral mass articulation, and mechanism of injury, a hyperflexion type injury acute osteophyte fracture cannot be excluded. Consider evaluation with MR to assess for bony edema. 2. Prior C3-4 anterior cervical spinal fusion and long segment fusion C3-T1 with posterior decompression C3-T7. There is bony fusion of the vertebral bodies across the C3-4 levels and bony fusion of the posterior elements from C3-T1. No evidence of hardware fracture or failure. 3. Multilevel spondylitic changes throughout the instrumented levels with multilevel anterior disc osteophyte complexes. There is posterior decompression from the C2-T1 levels. No visible canal hematoma. These results were called by telephone at the time of interpretation on 06/19/2019 at 5:16 am to provider [facility] , who verbally acknowledged these results. Electronically Signed   By: Lovena Le M.D.   On: 06/19/2019 05:16   MR Cervical Spine Wo Contrast  Result Date: 06/19/2019 CLINICAL DATA:  Neck pain after falling.  History of cervical fusion. Indeterminate findings on CT. EXAM: MRI CERVICAL SPINE WITHOUT CONTRAST TECHNIQUE: Multiplanar, multisequence MR imaging of the cervical spine was performed. No intravenous contrast was administered. COMPARISON:  CT cervical spine 06/19/2019. FINDINGS: Alignment: Stable and near anatomic. Vertebrae: No evidence of acute fracture or traumatic subluxation. There is no bone marrow edema within the osseous fragment inferior to the anterior arch of C1. Patient is status post extensive cervical fusion with C3-7 laminectomies and bilateral posterior fusion from C3 through T1. Anterior plate and screws  are present at C3-4. Fusion appears solid at each level. Cord: Normal in signal and caliber. Posterior Fossa, vertebral arteries, paraspinal tissues: Mild atrophy within the posterior fossa. Diminutive left vertebral artery without definite flow void. No significant paraspinal findings. Disc levels: C1-2: Synovial thickening around the odontoid process with bony fragment inferior to the anterior arch of C1. No bone marrow edema or spinal stenosis. C2-3: Spondylosis with disc bulging and asymmetric uncinate spurring on the right. Mild bilateral facet hypertrophy. Mild-to-moderate right foraminal narrowing. No cord deformity. The spinal canal is well-decompressed status post laminectomy and fusion from C3-4 through C7-T1. Mild residual foraminal narrowing due to endplate osteophytes and facet hypertrophy. T1-2: Adjacent segment disease with annular disc bulging and endplate osteophytes asymmetric to the right. Bilateral facet hypertrophy. Resulting mild spinal stenosis with asymmetric moderate right foraminal narrowing. T2-3: Sagittal images demonstrate disc bulging and mild right foraminal narrowing. No cord deformity. IMPRESSION: 1. No acute findings or explanation for the patient's symptoms. No evidence of acute injury of the anterior arch of C1. 2. Previous C3-7 laminectomy and fusion. No residual spinal stenosis or cord deformity. 3. 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 Electronically Signed   By: Richardean Sale M.D.   On: 06/19/2019 09:42   CT Maxillofacial Wo Contrast  Result Date: 06/19/2019 CLINICAL DATA:  Witnessed fall from bed with facial strike upon the dresser, right periorbital hematoma EXAM: CT HEAD WITHOUT CONTRAST CT MAXILLOFACIAL WITHOUT CONTRAST CT CERVICAL SPINE WITHOUT CONTRAST TECHNIQUE: Multidetector CT imaging of the head, cervical spine, and maxillofacial structures were performed using the standard protocol without  intravenous contrast. Multiplanar CT image reconstructions of the cervical spine and maxillofacial structures were also generated. COMPARISON:  CT head 09/11/2016 FINDINGS: CT HEAD FINDINGS Brain: Suboptimal imaging quality due to patient motion artifact. May limit detection of subtle anomalies. No evidence of acute infarction, hemorrhage, hydrocephalus, extra-axial collection or mass lesion/mass effect. Symmetric prominence of the ventricles, cisterns and sulci compatible with parenchymal volume loss. Patchy areas of white matter hypoattenuation are most compatible with chronic microvascular angiopathy. Vascular: Atherosclerotic calcification of the carotid siphons and intradural vertebral arteries. No hyperdense vessel. Skull: Extensive right frontal and supraorbital scalp swelling with crescentic hematoma measuring up to 1 cm maximal thickness. No definite calvarial fracture though motion artifact may limit detection of nondisplaced fractures. Other: None CT MAXILLOFACIAL FINDINGS Osseous: Remote fracture left orbital floor. No acute fracture of the bony orbits. Deformity left nasal bone, age indeterminate. Correlate with point tenderness. No other mid face fractures are seen. The pterygoid plates are intact. The mandible is intact. Temporomandibular joints are normally aligned. No temporal bone fractures are identified. Complete absence of the maxillary dentition. Numerous absent mandibular teeth. Multiple carious lesions of the mandibular dentition. No fractured or avulsed teeth. Orbits: Right periorbital soft tissue swelling and palpebral thickening. No retro septal stranding or gas. Bilateral lens extractions are noted. The globes appear normal and symmetric. Symmetric  appearance of the extraocular musculature and optic nerve sheath complexes. Normal caliber of the superior ophthalmic veins. Sinuses: Soft tissue thickening within the anterior ethmoids. Remaining paranasal sinuses are predominantly clear. No  air-fluid levels. No pneumatized secretions. Middle ear cavities are clear. Debris is noted in the external auditory canal on the left and right. Ossicular chains are normally configured. Mastoid air cells are well aerated, better seen on CT images of the head. Soft tissues: Right periorbital soft tissue swelling and palpebral swelling. Small amount of skin thickening extending to the nasal bridge. Mild pre mandibular soft tissue thickening as well. Correlate for additional site of contusion. CT CERVICAL SPINE FINDINGS Alignment: Preservation of the normal cervical lordosis. Slight retrolisthesis C2 on 3 and C5 on C6 are likely degenerative in nature. Prior C3-4 anterior cervical spinal fusion and long segment fusion the C3-T1 levels. Slight anterior widening of the C1-2 lateral mass articulation. Skull base and vertebrae: There is fragmentation inferior to the anterior arch of C1. No other suspicious osseous lesions concerning for acute injury. There is bony fusion of the vertebral bodies across the C3-4 levels and bony fusion of the posterior elements from C3-T1. No evidence of hardware fracture or failure. Soft tissues and spinal canal: There is no significant soft tissue thickening adjacent the anterior arch of C1. Calcific pannus formation is noted posterior to the dens. No visible canal hematoma. Some limitations are imposed by streak artifact from patient's cervical fusion hardware. Disc levels: Multilevel cervical spondylitic changes throughout the instrumented levels with multilevel anterior disc osteophyte complexes. There is posterior decompression from the C2-T1 levels.At most mild to moderate residual multilevel neural foraminal narrowing is seen most pronounced at C3-4. Upper chest: Biapical pleuroparenchymal scarring. Lung apices are otherwise clear. Other: Normal thyroid gland. Cervical carotid atherosclerosis. IMPRESSION: CT head 1. Extensive right frontal and supraorbital scalp swelling with  crescentic hematoma measuring up to 1 cm maximal thickness. No definite calvarial fracture though motion artifact may limit detection of nondisplaced fractures. 2. No CT evidence of acute intracranial hemorrhage or other acute intracranial abnormality. 3. Advanced parenchymal atrophy and microvascular angiopathy CT facial bones 1. Deformity of the left nasal bone, age indeterminate. Correlate with point tenderness. No other mid face fractures identified. Remote left orbital floor fracture. 2. Right periorbital soft tissue swelling and palpebral thickening. No retro septal stranding or gas. 3. Small amount of skin thickening anterior to the mandible. Correlate for additional site of contusion. 4. Extensive carious lesions of the dentition, correlate with dental exam. CT cervical spine 1. Fragment seen inferior to the anterior arch of C1 may be degenerative in nature given absence of significant stranding or hemorrhage however given slight anterior widening of the C1-2 lateral mass articulation, and mechanism of injury, a hyperflexion type injury acute osteophyte fracture cannot be excluded. Consider evaluation with MR to assess for bony edema. 2. Prior C3-4 anterior cervical spinal fusion and long segment fusion C3-T1 with posterior decompression C3-T7. There is bony fusion of the vertebral bodies across the C3-4 levels and bony fusion of the posterior elements from C3-T1. No evidence of hardware fracture or failure. 3. Multilevel spondylitic changes throughout the instrumented levels with multilevel anterior disc osteophyte complexes. There is posterior decompression from the C2-T1 levels. No visible canal hematoma. These results were called by telephone at the time of interpretation on 06/19/2019 at 5:16 am to provider [facility] , who verbally acknowledged these results. Electronically Signed   By: Lovena Le M.D.   On: 06/19/2019 05:16  Procedures Procedures  Medications Ordered in ED Medications - No  data to display  ED Course  I have reviewed the triage vital signs and the nursing notes.  Pertinent labs results that were available during my care of the patient were reviewed by me and considered in my medical decision making (see chart for details).    MDM Rules/Calculators/A&P                      6:07 AM Patient presents for nursing home for a fall.  He was just seen in the ER on January 27 for a fall.  At that time he was noted to have a hematoma around his right eye.  Now appears that that hematoma has progressed, but no evidence of eye injury. Imaging from previous ER visit negative for acute findings. When I called the nursing home, they reported that he had no new signs of injury. Will check labs but defer imaging for now, I discussed case with his daughter, she thinks he has UTI 6:55 AM Signed  Out to dr pickering at shift change Final Clinical Impression(s) / ED Diagnoses Final diagnoses:  None    Rx / DC Orders ED Discharge Orders    None       Ripley Fraise, MD 06/20/19 [phone]

## 2019-08-02 NOTE — ED Provider Notes (Signed)
Wayne Fuller   CSN: [id number] Arrival date & time: 06/19/19  0236     History Chief Complaint  Patient presents with  . Fall    Wayne Fuller is a 86 y.o. male.   Fall This is a recurrent problem. The current episode started yesterday. The problem occurs every several days. The problem has not changed since onset.Associated symptoms include headaches. Pertinent negatives include no chest pain. Nothing aggravates the symptoms. Nothing relieves the symptoms. He has tried nothing for the symptoms. The treatment provided no relief.       Past Medical History:  Diagnosis Date  . BPH (benign prostatic hyperplasia)   . Chronic kidney disease    ckd stage 3  . Dementia ([place])   . GERD (gastroesophageal reflux disease)   . Hemorrhoids   . History of angina   . Hyperlipemia   . Hypertension   . Irregular heart rhythm   . Paroxysmal A-fib ([place]) 04/02/2015    Patient Active Problem List   Diagnosis Date Noted  . HCAP (healthcare-associated pneumonia) 04/22/2018  . Hypoglycemia 04/22/2018  . S/p left hip fracture 09/29/2016  . Dehydration   . 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 Dislocation of internal left hip prosthesis ([place]) 09/21/2016  . Hyperkalemia 09/21/2016  . Protein-calorie malnutrition, severe 09/21/2016  . Anemia, chronic disease 01/30/2016  . Acute kidney injury superimposed on CKD ([place]) 01/30/2016  . Dementia with behavioral disturbance ([place]) 01/19/2016  . Back pain 01/19/2016  . Elevated troponin 04/02/2015  . Paroxysmal A-fib ([place]) 04/02/2015  . CKD (chronic kidney disease) stage 4, GFR 15-29 ml/min ([facility]) 08/11/2014  . GERD (gastroesophageal reflux disease) 08/11/2014  . Essential hypertension 04/29/2009  . CAD (coronary artery disease) 04/29/2009  . TOBACCO ABUSE, HX OF 04/29/2009    Past Surgical History:  Procedure Laterality Date  . BACK SURGERY    .  CERVICAL SPINE SURGERY    . SPINE SURGERY         Family History  Problem Relation Age of Onset  . Stroke Mother   . Hypertension Sister   . Other Son        Homicide, stabbed  . Stroke Son   . Hypertension Son   . Diabetes Son   . Hypertension Son     Social History   Tobacco Use  . Smoking status: Former Smoker    Packs/day: 0.50    Years: 10.00    Pack years: 5.00    Types: Cigarettes  . Smokeless tobacco: Never Used  . Tobacco comment: quit smoking  around 1996  Substance Use Topics  . Alcohol use: No    Comment: hx of occasional ETOH use  . Drug use: No    Home Medications Prior to Admission medications   Medication Sig Start Date End Date Taking? Authorizing Provider  acetaminophen (TYLENOL) 500 MG tablet Take 500 mg by mouth every 6 (six) hours as needed for headache (minor discomfort, fever 99.5-101F).   Yes [provider]  alum & mag hydroxide-simeth ([place]) 200-200-20 MG/5ML suspension Take 30 mLs by mouth every 6 (six) hours as needed for indigestion or heartburn.   Yes [provider]  amiodarone (PACERONE) 200 MG tablet Take 1 tablet (200 mg total) by mouth daily. 10/20/16  Yes [name], NP  amLODipine (NORVASC) 10 MG tablet Take 1 tablet (10 mg total) by mouth daily. 10/20/16  Yes [name], NP  aspirin EC 81  MG tablet Take 81 mg by mouth daily. For CAD   Yes [provider]  cyanocobalamin 1000 MCG tablet Take 1,000 mcg by mouth daily. Vitamin B12   Yes [provider]  ferrous sulfate 325 (65 FE) MG tablet Take 325 mg by mouth daily. For Anemia   Yes [provider]  guaifenesin (ROBAFEN) 100 MG/5ML syrup Take 200 mg by mouth every 6 (six) hours as needed for cough.   Yes [provider]  hydrALAZINE (APRESOLINE) 100 MG tablet Take 1 tablet (100 mg total) by mouth 3 (three) times daily. 10/20/16  Yes [name], NP  loperamide (IMODIUM) 2 MG capsule Take 2 mg by mouth as  needed for diarrhea or loose stools (not to exceed 8 doses in 24 hours).   Yes [provider]  LORazepam (ATIVAN) 0.5 MG tablet Take 1 tablet (0.5 mg total) by mouth every 8 (eight) hours as needed (agitation). 04/27/18  Yes Ghimire, Henreitta Leber, MD  magnesium hydroxide (MILK OF MAGNESIA) 400 MG/5ML suspension Take 30 mLs by mouth daily as needed for mild constipation.   Yes [provider]  Melatonin 3 MG TABS Take 3 mg by mouth at bedtime. For sleep   Yes [provider]  memantine (NAMENDA) 5 MG tablet Take 5 mg by mouth daily. For Dementia   Yes [provider]  metoprolol succinate (TOPROL-XL) 25 MG 24 hr tablet Take 1 tablet (25 mg total) by mouth daily. 08/24/16  Yes [name], NP  Neomycin-Bacitracin-Polymyxin (TRIPLE ANTIBIOTIC) [phone] OINT Apply 1 application topically 4 (four) times daily as needed (minor skin tears or abrasions). Clean area with normal saline, apply triple antibiotic, cover with bandaid or gauze and tape, change as needed until healed.   Yes [provider]  omeprazole (PRILOSEC) 20 MG capsule Take 20 mg by mouth daily at 6 (six) AM. For GERD   Yes [provider]  QUEtiapine (SEROQUEL) 25 MG tablet Take 0.5 tablets (12.5 mg total) by mouth at bedtime. 04/27/18  Yes Ghimire, Henreitta Leber, MD  sodium bicarbonate 650 MG tablet Take 1 tablet (650 mg total) by mouth 2 (two) times daily. 09/22/16  Yes Barton Dubois, MD  tamsulosin (FLOMAX) 0.4 MG CAPS capsule TAKE 1 CAPSULE(0.4 MG) BY MOUTH DAILY AFTER SUPPER Patient taking differently: Take 0.4 mg by mouth at bedtime.  06/21/16  Yes [name], NP  amoxicillin-clavulanate (AUGMENTIN) 500-125 MG tablet Take 1 tablet (500 mg total) by mouth every 12 (twelve) hours. Last dose on 12/7 evening Patient not taking: Reported on 06/19/2019 04/27/18   Jonetta Osgood, MD  polyethylene glycol (MIRALAX) 17 g packet Take 17 g by mouth daily. 06/19/19   Dorie Rank, MD    traMADol (ULTRAM) 50 MG tablet Take 1 tablet (50 mg total) by mouth every 6 (six) hours as needed for moderate pain. Patient not taking: Reported on 06/19/2019 04/27/18   Jonetta Osgood, MD    Allergies    Patient has no known allergies.  Review of Systems   Review of Systems  Cardiovascular: Negative for chest pain.  Neurological: Positive for headaches.  All other systems reviewed and are negative.   Physical Exam Updated Vital Signs BP (!) 158/65   Pulse (!) 50   Resp 16   SpO2 97%   Physical Exam Vitals and nursing Fuller reviewed.  Constitutional:      Appearance: He is well-developed.  HENT:     Head: Normocephalic and atraumatic.  Mouth/Throat:     Mouth: Mucous membranes are dry.     Pharynx: Oropharynx is clear.  Eyes:     Conjunctiva/sclera: Conjunctivae normal.  Cardiovascular:     Rate and Rhythm: Normal rate.  Pulmonary:     Effort: Pulmonary effort is normal. No respiratory distress.  Abdominal:     General: There is no distension.  Musculoskeletal:        General: Tenderness (c spine) present. Normal range of motion.     Cervical back: Normal range of motion.  Skin:    General: Skin is warm and dry.     Coloration: Skin is not pale.  Neurological:     General: No focal deficit present.     Mental Status: He is alert.     ED Results / Procedures / Treatments   Labs (all labs ordered are listed, but only abnormal results are displayed) Labs Reviewed - No data to display  EKG EKG Interpretation  Date/Time:  Wednesday June 19 2019 02:53:47 EST Ventricular Rate:  64 PR Interval:    QRS Duration: 112 QT Interval:  438 QTC Calculation: 452 R Axis:   -36 Text Interpretation: Sinus rhythm Prolonged PR interval LVH with secondary repolarization abnormality Anterior Q waves, possibly due to LVH new STE in V2 only baseline with artifact but likely new inverted T waves in multiple leads as well. Confirmed by Merrily Pew [phone]) on 06/19/2019  3:27:20 AM   Radiology No results found.  Procedures Procedures (including critical care time)  Medications Ordered in ED Medications  acetaminophen (TYLENOL) tablet 1,000 mg (1,000 mg Oral Given 06/19/19 0355)  LORazepam (ATIVAN) injection 0.5 mg (0.5 mg Intravenous Given 06/19/19 1340)  LORazepam (ATIVAN) injection 1 mg (1 mg Intramuscular Given 06/19/19 1624)    ED Course  I have reviewed the triage vital signs and the nursing notes.  Pertinent labs & imaging results that were available during my care of the patient were reviewed by me and considered in my medical decision making (see chart for details).    MDM Rules/Calculators/A&P                      Here with another fall. Questionable injury on cervical ct with ttp, will MRI. otherwise negative workup.   Care transferred pending MRI and results.   Final Clinical Impression(s) / ED Diagnoses Final diagnoses:  Injury of head, initial encounter  Constipation, unspecified constipation type    Rx / DC Orders ED Discharge Orders         Ordered    polyethylene glycol (MIRALAX) 17 g packet  Daily     06/19/19 1427           Aneka Fagerstrom, Corene Cornea, MD 08/02/19 [phone]

## 2019-12-27 ENCOUNTER — Emergency Department (HOSPITAL_COMMUNITY)
Admission: EM | Admit: 2019-12-27 | Discharge: 2019-12-27 | Disposition: A | Discharge status: Home / Self Care | Payer: Medicare Other | Attending: Emergency Medicine | Admitting: Emergency Medicine

## 2019-12-27 ENCOUNTER — Other Ambulatory Visit: Payer: Self-pay

## 2019-12-27 DIAGNOSIS — T783XXA Angioneurotic edema, initial encounter: Secondary | ICD-10-CM | POA: Diagnosis present

## 2019-12-27 DIAGNOSIS — F0390 Unspecified dementia without behavioral disturbance: Secondary | ICD-10-CM | POA: Diagnosis not present

## 2019-12-27 DIAGNOSIS — T7840XA Allergy, unspecified, initial encounter: Secondary | ICD-10-CM | POA: Diagnosis not present

## 2019-12-27 DIAGNOSIS — Z79899 Other long term (current) drug therapy: Secondary | ICD-10-CM | POA: Insufficient documentation

## 2019-12-27 DIAGNOSIS — I129 Hypertensive chronic kidney disease with stage 1 through stage 4 chronic kidney disease, or unspecified chronic kidney disease: Secondary | ICD-10-CM | POA: Insufficient documentation

## 2019-12-27 DIAGNOSIS — I2510 Atherosclerotic heart disease of native coronary artery without angina pectoris: Secondary | ICD-10-CM | POA: Diagnosis not present

## 2019-12-27 DIAGNOSIS — N183 Chronic kidney disease, stage 3 unspecified: Secondary | ICD-10-CM | POA: Diagnosis not present

## 2019-12-27 DIAGNOSIS — Z87891 Personal history of nicotine dependence: Secondary | ICD-10-CM | POA: Diagnosis not present

## 2019-12-27 MED ORDER — METHYLPREDNISOLONE SODIUM SUCC 125 MG IJ SOLR
125.0000 mg | Freq: Once | INTRAMUSCULAR | Status: AC
  Administered 2019-12-27: 125 mg via INTRAVENOUS
  Filled 2019-12-27: qty 2

## 2019-12-27 MED ORDER — EPINEPHRINE 0.3 MG/0.3ML IJ SOAJ
0.3000 mg | Freq: Once | INTRAMUSCULAR | Status: AC
  Administered 2019-12-27: 0.3 mg via INTRAMUSCULAR
  Filled 2019-12-27: qty 0.3

## 2019-12-27 MED ORDER — FAMOTIDINE IN NACL 20-0.9 MG/50ML-% IV SOLN
20.0000 mg | Freq: Once | INTRAVENOUS | Status: AC
  Administered 2019-12-27: 20 mg via INTRAVENOUS
  Filled 2019-12-27: qty 50

## 2019-12-27 NOTE — Discharge Instructions (Signed)
Please follow up with PCP regarding ED visit today Return to the ED for any worsening symptoms

## 2019-12-27 NOTE — ED Triage Notes (Addendum)
Pt BIBA from [facility] (per paperwork)-  Per EMS-  Staff reports finding pt this AM with angioedema, facial swelling below eyes. Drooling. Pt arrived to ED speaking in full sentences, on RA, NAD.   EMS gave 50 mg PO benadryl (1287).   Staff reports pt is mentally at baseline, hx of dementia. Ambulatory with assistance.

## 2019-12-27 NOTE — ED Notes (Signed)
[facility] called for transport.  

## 2019-12-27 NOTE — ED Provider Notes (Signed)
[place] DEPT Provider Note   CSN: [id number] Arrival date & time: 12/27/19  1013     History Chief Complaint  Patient presents with  . Angioedema   LEVEL 5 CAVEAT - DEMENTIA  Wayne Fuller is a 86 y.o. male with PMHx HTN, HLD, A fib, GERD, CKF stage III, BPH, and dementia who presents to the ED today via EMS for facial swelling/angioedema. Per EMS they were called out after staff at [facility] noticed angioedema of the lower lip and facial swelling below eyes bilaterally. They also report they were called out as pt was drooling however when pt arrived to the ED he was noted to be speaking in full sentences. EMS gave pt 50 mg oral Benadryl at 09:55 AM. Per nursing facility pt is at his baseline.   Additional information obtained by nursing facility at [facility] - they rounded on patient this morning around 7 AM without any signs of facial swelling. They noticed mild facial swelling prior to breakfast which appeared to worsen while pt was eating scrambled eggs and sausages. He has had this breakfast in the past without issue. They deny any new medications or food recently.   The history is provided by the patient, the EMS personnel, medical records and the nursing home.       Past Medical History:  Diagnosis Date  . BPH (benign prostatic hyperplasia)   . Chronic kidney disease    ckd stage 3  . Dementia ([place])   . GERD (gastroesophageal reflux disease)   . Hemorrhoids   . History of angina   . Hyperlipemia   . Hypertension   . Irregular heart rhythm   . Paroxysmal A-fib ([place]) 04/02/2015    Patient Active Problem List   Diagnosis Date Noted  . HCAP (healthcare-associated pneumonia) 04/22/2018  . Hypoglycemia 04/22/2018  . S/p left hip fracture 09/29/2016  . Dehydration   . 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 Dislocation of internal left hip prosthesis ([place]) 09/21/2016  .  Hyperkalemia 09/21/2016  . Protein-calorie malnutrition, severe 09/21/2016  . Anemia, chronic disease 01/30/2016  . Acute kidney injury superimposed on CKD ([place]) 01/30/2016  . Dementia with behavioral disturbance ([place]) 01/19/2016  . Back pain 01/19/2016  . Elevated troponin 04/02/2015  . Paroxysmal A-fib ([place]) 04/02/2015  . CKD (chronic kidney disease) stage 4, GFR 15-29 ml/min (HCC) 08/11/2014  . GERD (gastroesophageal reflux disease) 08/11/2014  . Essential hypertension 04/29/2009  . CAD (coronary artery disease) 04/29/2009  . TOBACCO ABUSE, HX OF 04/29/2009    Past Surgical History:  Procedure Laterality Date  . BACK SURGERY    . CERVICAL SPINE SURGERY    . SPINE SURGERY         Family History  Problem Relation Age of Onset  . Stroke Mother   . Hypertension Sister   . Other Son        Homicide, stabbed  . Stroke Son   . Hypertension Son   . Diabetes Son   . Hypertension Son     Social History   Tobacco Use  . Smoking status: Former Smoker    Packs/day: 0.50    Years: 10.00    Pack years: 5.00    Types: Cigarettes  . Smokeless tobacco: Never Used  . Tobacco comment: quit smoking  around 1996  Vaping Use  . Vaping Use: Never used  Substance Use Topics  . Alcohol use: No    Comment: hx of  occasional ETOH use  . Drug use: No    Home Medications Prior to Admission medications   Medication Sig Start Date End Date Taking? Authorizing Provider  acetaminophen (TYLENOL) 500 MG tablet Take 500 mg by mouth every 4 (four) hours as needed for headache (minor discomfort, fever 99.5-101F).    Yes [provider]  alum & mag hydroxide-simeth ([place]) 200-200-20 MG/5ML suspension Take 30 mLs by mouth every 6 (six) hours as needed for indigestion or heartburn.   Yes [provider]  amiodarone (PACERONE) 200 MG tablet Take 1 tablet (200 mg total) by mouth daily. 10/20/16  Yes [name], NP  amLODipine (NORVASC) 10 MG tablet Take 1 tablet (10 mg  total) by mouth daily. 10/20/16  Yes [name], NP  aspirin EC 81 MG tablet Take 81 mg by mouth daily. For CAD   Yes [provider]  cyanocobalamin 1000 MCG tablet Take 1,000 mcg by mouth daily. Vitamin B12   Yes [provider]  ferrous sulfate 325 (65 FE) MG tablet Take 325 mg by mouth daily. For Anemia   Yes [provider]  guaifenesin (ROBAFEN) 100 MG/5ML syrup Take 200 mg by mouth every 6 (six) hours as needed for cough.   Yes [provider]  hydrALAZINE (APRESOLINE) 100 MG tablet Take 1 tablet (100 mg total) by mouth 3 (three) times daily. 10/20/16  Yes [name], NP  loperamide (IMODIUM) 2 MG capsule Take 2 mg by mouth as needed for diarrhea or loose stools (not to exceed 8 doses in 24 hours).   Yes [provider]  magnesium hydroxide (MILK OF MAGNESIA) 400 MG/5ML suspension Take 30 mLs by mouth daily as needed for mild constipation.   Yes [provider]  Melatonin 3 MG TABS Take 3 mg by mouth at bedtime. For sleep   Yes [provider]  memantine (NAMENDA) 5 MG tablet Take 5 mg by mouth daily. For Dementia   Yes [provider]  metoprolol succinate (TOPROL-XL) 25 MG 24 hr tablet Take 1 tablet (25 mg total) by mouth daily. 08/24/16  Yes [name], NP  Neomycin-Bacitracin-Polymyxin (TRIPLE ANTIBIOTIC) [phone] OINT Apply 1 application topically 4 (four) times daily as needed (minor skin tears or abrasions). Clean area with normal saline, apply triple antibiotic, cover with bandaid or gauze and tape, change as needed until healed.   Yes [provider]  omeprazole (PRILOSEC) 20 MG capsule Take 20 mg by mouth daily at 6 (six) AM. For GERD   Yes [provider]  polyethylene glycol (MIRALAX) 17 g packet Take 17 g by mouth daily. 06/19/19  Yes Dorie Rank, MD  QUEtiapine (SEROQUEL) 25 MG tablet Take 0.5 tablets (12.5 mg total) by mouth at bedtime. 04/27/18  Yes Ghimire, Henreitta Leber,  MD  sodium bicarbonate 650 MG tablet Take 1 tablet (650 mg total) by mouth 2 (two) times daily. 09/22/16  Yes Barton Dubois, MD  tamsulosin (FLOMAX) 0.4 MG CAPS capsule TAKE 1 CAPSULE(0.4 MG) BY MOUTH DAILY AFTER SUPPER Patient taking differently: Take 0.4 mg by mouth at bedtime.  06/21/16  Yes [name], NP  Vitamin D, Cholecalciferol, 25 MCG (1000 UT) CAPS Take 1,000 Units by mouth daily.   Yes [provider]  amoxicillin-clavulanate (AUGMENTIN) 500-125 MG tablet Take 1 tablet (500 mg total) by mouth every 12 (twelve) hours. Last dose on 12/7 evening Patient not taking: Reported on 06/19/2019 04/27/18   Jonetta Osgood, MD  LORazepam (ATIVAN) 0.5  MG tablet Take 1 tablet (0.5 mg total) by mouth every 8 (eight) hours as needed (agitation). Patient not taking: Reported on 12/27/2019 04/27/18   Jonetta Osgood, MD  traMADol (ULTRAM) 50 MG tablet Take 1 tablet (50 mg total) by mouth every 6 (six) hours as needed for moderate pain. Patient not taking: Reported on 06/19/2019 04/27/18   Jonetta Osgood, MD    Allergies    Patient has no known allergies.  Review of Systems   Review of Systems  Unable to perform ROS: Dementia  HENT: Positive for facial swelling.     Physical Exam Updated Vital Signs BP (!) 153/66   Pulse (!) 57   Temp 99.5 F (37.5 C) (Oral)   Resp 15   SpO2 100%   Physical Exam Vitals and nursing note reviewed.  Constitutional:      Appearance: He is not ill-appearing or diaphoretic.     Comments: Pt speaking in full sentences without difficulty. He is at baseline.   HENT:     Head: Atraumatic.     Comments: Significant swelling noted to lower lip, greater on R side compared to L. Mild swelling noted infraorbital area bilaterally. No signs of oral swelling; uvula is midline; airway patent Eyes:     Conjunctiva/sclera: Conjunctivae normal.  Cardiovascular:     Rate and Rhythm: Normal rate and regular rhythm.  Pulmonary:     Effort: Pulmonary  effort is normal. No respiratory distress.     Breath sounds: Normal breath sounds. No stridor. No wheezing, rhonchi or rales.  Abdominal:     Palpations: Abdomen is soft.     Tenderness: There is no abdominal tenderness.  Musculoskeletal:     Cervical back: Neck supple.  Skin:    General: Skin is warm and dry.  Neurological:     Mental Status: He is alert. Mental status is at baseline.     ED Results / Procedures / Treatments   Labs (all labs ordered are listed, but only abnormal results are displayed) Labs Reviewed - No data to display  EKG None  Radiology No results found.  Procedures Procedures (including critical care time)  Medications Ordered in ED Medications  methylPREDNISolone sodium succinate (SOLU-MEDROL) 125 mg/2 mL injection 125 mg (125 mg Intravenous Given 12/27/19 1127)  famotidine (PEPCID) IVPB 20 mg premix (0 mg Intravenous Stopped 12/27/19 1216)  EPINEPHrine (EPI-PEN) injection 0.3 mg (0.3 mg Intramuscular Given 12/27/19 1122)    ED Course  I have reviewed the triage vital signs and the nursing notes.  Pertinent labs & imaging results that were available during my care of the patient were reviewed by me and considered in my medical decision making (see chart for details).    MDM Rules/Calculators/A&P                          86 year old male who presents to the ED from [facility] memory care facility for facial swelling that they noticed this morning.  No new medicines or foods.  EMS gave patient 50 mg p.o. Benadryl and brought him to the ED.  Present nursing staff patient is at baseline mentally.  On arrival to the ED patient is afebrile, nontachycardic nontachypneic.  He appears to be in no acute distress.  Is noted to have lower lip swelling, right greater than left side.  Patient also has some bilateral swelling to his mid face underneath his eyes.  Does not appear to extend the oral  cavity.  His airway is patent and uvula is midline.  He is  tolerating his own secretions.  He is satting 99% on room air.  Given that patient is having lip swelling with concern that it could extend into the oral cavity will provide epinephrine at this time.  Will provide Pepcid and Solu-Medrol as well, patient has already received Benadryl with EMS.  We will plan to monitor for a couple of hours after epinephrine.  Discussed case with attending physician Dr. Kathrynn Humble, he evaluated patient himself and agrees with plan.   Monitored in the ED for over 3 hours after epinephrine.  He appears to be in no acute distress and we will plan to discharge home to [facility].   This note was prepared using Dragon voice recognition software and may include unintentional dictation errors due to the inherent limitations of voice recognition software.  Final Clinical Impression(s) / ED Diagnoses Final diagnoses:  Allergic reaction, initial encounter    Rx / DC Orders ED Discharge Orders    None       Eustaquio Maize, PA-C 12/27/19 1328    Varney Biles, MD 12/27/19 1544

## 2019-12-27 NOTE — ED Notes (Signed)
Attempted call report to [facility] and put on hold with no answer.

## 2020-03-16 ENCOUNTER — Emergency Department (HOSPITAL_COMMUNITY)
Admission: EM | Admit: 2020-03-16 | Discharge: 2020-03-16 | Disposition: A | Discharge status: Home / Self Care | Payer: Medicare Other | Attending: Emergency Medicine | Admitting: Emergency Medicine

## 2020-03-16 ENCOUNTER — Encounter (HOSPITAL_COMMUNITY): Payer: Self-pay

## 2020-03-16 ENCOUNTER — Other Ambulatory Visit: Payer: Self-pay

## 2020-03-16 DIAGNOSIS — I129 Hypertensive chronic kidney disease with stage 1 through stage 4 chronic kidney disease, or unspecified chronic kidney disease: Secondary | ICD-10-CM | POA: Insufficient documentation

## 2020-03-16 DIAGNOSIS — R040 Epistaxis: Secondary | ICD-10-CM | POA: Diagnosis not present

## 2020-03-16 DIAGNOSIS — I2510 Atherosclerotic heart disease of native coronary artery without angina pectoris: Secondary | ICD-10-CM | POA: Diagnosis not present

## 2020-03-16 DIAGNOSIS — F0390 Unspecified dementia without behavioral disturbance: Secondary | ICD-10-CM | POA: Insufficient documentation

## 2020-03-16 DIAGNOSIS — N184 Chronic kidney disease, stage 4 (severe): Secondary | ICD-10-CM | POA: Diagnosis not present

## 2020-03-16 DIAGNOSIS — Z87891 Personal history of nicotine dependence: Secondary | ICD-10-CM | POA: Diagnosis not present

## 2020-03-16 DIAGNOSIS — J3489 Other specified disorders of nose and nasal sinuses: Secondary | ICD-10-CM | POA: Diagnosis present

## 2020-03-16 DIAGNOSIS — D649 Anemia, unspecified: Secondary | ICD-10-CM | POA: Insufficient documentation

## 2020-03-16 LAB — CBC WITH DIFFERENTIAL/PLATELET
Abs Immature Granulocytes: 0.03 10*3/uL (ref 0.00–0.07)
Basophils Absolute: 0 10*3/uL (ref 0.0–0.1)
Basophils Relative: 1 %
Eosinophils Absolute: 0 10*3/uL (ref 0.0–0.5)
Eosinophils Relative: 1 %
HCT: 25.6 % — ABNORMAL LOW (ref 39.0–52.0)
Hemoglobin: 7.6 g/dL — ABNORMAL LOW (ref 13.0–17.0)
Immature Granulocytes: 1 %
Lymphocytes Relative: 24 %
Lymphs Abs: 0.8 10*3/uL (ref 0.7–4.0)
MCH: 32.2 pg (ref 26.0–34.0)
MCHC: 29.7 g/dL — ABNORMAL LOW (ref 30.0–36.0)
MCV: 108.5 fL — ABNORMAL HIGH (ref 80.0–100.0)
Monocytes Absolute: 0.3 10*3/uL (ref 0.1–1.0)
Monocytes Relative: 10 %
Neutro Abs: 2.1 10*3/uL (ref 1.7–7.7)
Neutrophils Relative %: 63 %
Platelets: 187 10*3/uL (ref 150–400)
RBC: 2.36 MIL/uL — ABNORMAL LOW (ref 4.22–5.81)
RDW: 14.7 % (ref 11.5–15.5)
WBC: 3.4 10*3/uL — ABNORMAL LOW (ref 4.0–10.5)
nRBC: 0 % (ref 0.0–0.2)

## 2020-03-16 MED ORDER — QUETIAPINE FUMARATE 25 MG PO TABS
ORAL_TABLET | ORAL | Status: AC
  Administered 2020-03-16: 12.5 mg via ORAL
  Filled 2020-03-16: qty 1

## 2020-03-16 MED ORDER — SILVER NITRATE-POT NITRATE 75-25 % EX MISC
1.0000 | Freq: Once | CUTANEOUS | Status: AC
  Administered 2020-03-16: 1 via TOPICAL
  Filled 2020-03-16: qty 30

## 2020-03-16 MED ORDER — QUETIAPINE FUMARATE 25 MG PO TABS
12.5000 mg | ORAL_TABLET | Freq: Once | ORAL | Status: AC
  Filled 2020-03-16: qty 1

## 2020-03-16 MED ORDER — TRANEXAMIC ACID FOR EPISTAXIS
500.0000 mg | Freq: Once | TOPICAL | Status: AC
  Administered 2020-03-16: 500 mg via TOPICAL
  Filled 2020-03-16: qty 10

## 2020-03-16 NOTE — Discharge Instructions (Addendum)
You were seen in the emergency room today with a nosebleed. The nosebleed has ultimately stopped. I have listed the ENT provider on call on this paperwork and you can call tomorrow to schedule a follow-up appointment later this week.  Your lab work today showed anemia.  Your hemoglobin is 7.6 and you will need to have repeat blood work drawn in the next 4 days to make sure this is not downtrending further.   Return to the ED with any new or worsening symptoms.

## 2020-03-16 NOTE — ED Notes (Signed)
Attempted to call report back to [facility] x 2, no answer.

## 2020-03-16 NOTE — ED Notes (Signed)
Mittens placed on patient due to trying to pull out Rhino rocket

## 2020-03-16 NOTE — ED Triage Notes (Signed)
intermittent nose bleed 6-7 days described as dribbling per ems. Patient is using Afrin at facility.   BP:160/60 HR:52 R:18 Sp02:98 cbg:143

## 2020-03-16 NOTE — ED Notes (Signed)
Pt self removed rhino rocket from nose, MD aware. Will continue to monitor for any new signs of bleeding.

## 2020-03-16 NOTE — ED Notes (Signed)
Rhino rocket applied to left nare

## 2020-03-16 NOTE — ED Notes (Signed)
Attempted to call report to facility x2 with no response. Called [facility] for transport

## 2020-03-16 NOTE — ED Provider Notes (Signed)
Emergency Department Provider Note   I have reviewed the triage vital signs and the nursing notes.   HISTORY  Chief Complaint Epistaxis   HPI Wayne Fuller is a 86 y.o. male with past medical history reviewed below presents to the emergency department with intermittent oozing from the left nostril.  Patient is at a nursing home and has history of dementia.  Level 5 caveat applies given this.  Per EMS the patient has had 6 days of intermittent oozing blood from the left nostril.  No report of large-volume epistaxis.  Patient is on aspirin daily but in review of his medicines does not appear to be on Coumadin or other anticoagulation.  He denies any pain or symptoms at this time.   Past Medical History:  Diagnosis Date  . BPH (benign prostatic hyperplasia)   . Chronic kidney disease    ckd stage 3  . Dementia ([place])   . GERD (gastroesophageal reflux disease)   . Hemorrhoids   . History of angina   . Hyperlipemia   . Hypertension   . Irregular heart rhythm   . Paroxysmal A-fib ([place]) 04/02/2015    Patient Active Problem List   Diagnosis Date Noted  . HCAP (healthcare-associated pneumonia) 04/22/2018  . Hypoglycemia 04/22/2018  . S/p left hip fracture 09/29/2016  . Dehydration   . 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 Dislocation of internal left hip prosthesis ([place]) 09/21/2016  . Hyperkalemia 09/21/2016  . Protein-calorie malnutrition, severe 09/21/2016  . Anemia, chronic disease 01/30/2016  . Acute kidney injury superimposed on CKD ([place]) 01/30/2016  . Dementia with behavioral disturbance ([place]) 01/19/2016  . Back pain 01/19/2016  . Elevated troponin 04/02/2015  . Paroxysmal A-fib ([place]) 04/02/2015  . CKD (chronic kidney disease) stage 4, GFR 15-29 ml/min (HCC) 08/11/2014  . GERD (gastroesophageal reflux disease) 08/11/2014  . Essential hypertension 04/29/2009  . CAD (coronary artery disease) 04/29/2009  . TOBACCO ABUSE, HX OF  04/29/2009    Past Surgical History:  Procedure Laterality Date  . BACK SURGERY    . CERVICAL SPINE SURGERY    . SPINE SURGERY      Allergies Patient has no known allergies.  Family History  Problem Relation Age of Onset  . Stroke Mother   . Hypertension Sister   . Other Son        Homicide, stabbed  . Stroke Son   . Hypertension Son   . Diabetes Son   . Hypertension Son     Social History Social History   Tobacco Use  . Smoking status: Former Smoker    Packs/day: 0.50    Years: 10.00    Pack years: 5.00    Types: Cigarettes  . Smokeless tobacco: Never Used  . Tobacco comment: quit smoking  around 1996  Vaping Use  . Vaping Use: Never used  Substance Use Topics  . Alcohol use: No    Comment: hx of occasional ETOH use  . Drug use: No    Review of Systems  Level 5 caveat: Dementia   ____________________________________________   PHYSICAL EXAM:  VITAL SIGNS: ED Triage Vitals  Enc Vitals Group     BP 03/16/20 1521 (!) 169/60     Pulse Rate 03/16/20 1521 (!) 55     Resp 03/16/20 1521 18     Temp 03/16/20 1521 98.5 F (36.9 C)     Temp Source 03/16/20 1521 Oral     SpO2 03/16/20 1521 100 %  Weight 03/16/20 1515 178 lb 9.2 oz (81 kg)     Height 03/16/20 1515 6\' 2"  (1.88 m)   Constitutional: Alert but confused.  Eyes: Conjunctivae are normal. Head: Atraumatic. Nose: Clot in the left nostril. No active bleeding.  Mouth/Throat: Mucous membranes are moist. Neck: No stridor.   Cardiovascular: Bradycardia. Good peripheral circulation. Respiratory: Normal respiratory effort.  Gastrointestinal: No distention.  Musculoskeletal: No gross deformities of extremities. Neurologic:  Normal speech and language.  Skin: No rash noted.  ____________________________________________   LABS (all labs ordered are listed, but only abnormal results are displayed)  Labs Reviewed  CBC WITH DIFFERENTIAL/PLATELET - Abnormal; Notable for the following components:       Result Value   WBC 3.4 (*)    RBC 2.36 (*)    Hemoglobin 7.6 (*)    HCT 25.6 (*)    MCV 108.5 (*)    MCHC 29.7 (*)    All other components within normal limits   ____________________________________________   PROCEDURES  Procedure(s) performed:   .Epistaxis Management  Date/Time: 03/16/2020 6:16 PM Performed by: Margette Fast, MD Authorized by: Margette Fast, MD   Consent:    Consent obtained:  Verbal   Consent given by:  Healthcare agent   Risks discussed:  Bleeding, infection, nasal injury and pain   Alternatives discussed:  Delayed treatment and no treatment Anesthesia (see MAR for exact dosages):    Anesthesia method:  None Procedure details:    Treatment site:  L anterior   Treatment method:  Anterior pack and silver nitrate (TXA)   Treatment complexity:  Limited   Treatment episode: initial   Post-procedure details:    Assessment:  Bleeding stopped   Patient tolerance of procedure:  Tolerated well, no immediate complications     ____________________________________________   INITIAL IMPRESSION / ASSESSMENT AND PLAN / ED COURSE  Pertinent labs & imaging results that were available during my care of the patient were reviewed by me and considered in my medical decision making (see chart for details).   Patient presents to the emergency department with oozing blood from the left nostril over the past 6 days.  Vital signs are largely unremarkable with only mild elevated blood pressure and bradycardia.  Patient has no active heavy bleeding.  Question mild oozing from the left nostril.  Plan for TXA and attempt at further visualization for possible cautery.   Patient ultimately required anterior packing after tXA and silver nitrate unsuccessful.   Patient boarding in the ED waiting for [facility] transport back to facility. Patient became confused and pulled out the packing. No bleeding afterwards. He remained in the ED for several hours after pulling out the packing  with no bleeding. Plan for ENT and PCP follow up as an outpatient. No replacement of packing required with high chance of pulling out again and causing trauma/worsening bleeding.  ____________________________________________  FINAL CLINICAL IMPRESSION(S) / ED DIAGNOSES  Final diagnoses:  Epistaxis  Anemia, unspecified type     MEDICATIONS GIVEN DURING THIS VISIT:  Medications  QUEtiapine (SEROQUEL) tablet 12.5 mg (12.5 mg Oral Not Given 03/16/20 1810)  tranexamic acid (CYKLOKAPRON) 1000 MG/10ML topical solution 500 mg (500 mg Topical Given 03/16/20 1546)  silver nitrate applicators applicator 1 Stick (1 Stick Topical Given 03/16/20 1638)    Note:  This document was prepared using Dragon voice recognition software and may include unintentional dictation errors.  Nanda Quinton, MD, [facility]    Syvanna Ciolino, [facility], MD 03/17/20 [phone]

## 2020-03-16 NOTE — ED Notes (Signed)
ent cart at bedside

## 2020-08-31 ENCOUNTER — Emergency Department (HOSPITAL_COMMUNITY)
Admission: EM | Admit: 2020-08-31 | Discharge: 2020-09-01 | Disposition: A | Discharge status: Home / Self Care | Payer: Medicare Other | Source: Home / Self Care | Attending: Emergency Medicine | Admitting: Emergency Medicine

## 2020-08-31 ENCOUNTER — Encounter (HOSPITAL_COMMUNITY): Payer: Self-pay | Admitting: Emergency Medicine

## 2020-08-31 ENCOUNTER — Other Ambulatory Visit: Payer: Self-pay

## 2020-08-31 DIAGNOSIS — Z7982 Long term (current) use of aspirin: Secondary | ICD-10-CM | POA: Insufficient documentation

## 2020-08-31 DIAGNOSIS — F0390 Unspecified dementia without behavioral disturbance: Secondary | ICD-10-CM | POA: Insufficient documentation

## 2020-08-31 DIAGNOSIS — Z87891 Personal history of nicotine dependence: Secondary | ICD-10-CM | POA: Insufficient documentation

## 2020-08-31 DIAGNOSIS — Z711 Person with feared health complaint in whom no diagnosis is made: Secondary | ICD-10-CM | POA: Insufficient documentation

## 2020-08-31 DIAGNOSIS — R0683 Snoring: Secondary | ICD-10-CM | POA: Insufficient documentation

## 2020-08-31 DIAGNOSIS — Z79899 Other long term (current) drug therapy: Secondary | ICD-10-CM | POA: Insufficient documentation

## 2020-08-31 DIAGNOSIS — N183 Chronic kidney disease, stage 3 unspecified: Secondary | ICD-10-CM | POA: Insufficient documentation

## 2020-08-31 DIAGNOSIS — I129 Hypertensive chronic kidney disease with stage 1 through stage 4 chronic kidney disease, or unspecified chronic kidney disease: Secondary | ICD-10-CM | POA: Insufficient documentation

## 2020-08-31 DIAGNOSIS — J90 Pleural effusion, not elsewhere classified: Secondary | ICD-10-CM | POA: Diagnosis not present

## 2020-08-31 DIAGNOSIS — E875 Hyperkalemia: Secondary | ICD-10-CM | POA: Diagnosis not present

## 2020-08-31 NOTE — ED Notes (Signed)
Attempted to call report to [facility]; mailbox not set up

## 2020-08-31 NOTE — ED Triage Notes (Signed)
Pt arrived via EMS from [facility]. Per EMS, staff at [facility] heard pt grunting and wheezing in his sleep and was worried that he was short of breath. Per EMS, pt's lungs have been clear. Pt has not been given his mucinex or nasal spray today. No distress noted, pt's oxygen level is at 100% on room air. Pt is non-verbal and has hx of dementia.

## 2020-08-31 NOTE — ED Provider Notes (Signed)
[facility] DEPT Provider Note: Georgena Spurling, MD, FACEP  CSN: [id number] MRN: [id number] ARRIVAL: 08/31/20 at 2153 ROOM: [place]  Snoring  Level 5 caveat: Dementia HISTORY OF PRESENT ILLNESS  08/31/20 11:22 PM Wayne Fuller is a 87 y.o. male who was sent from his living facility because staffed her patient grunting and making what was thought thought were wheezing sounds during his sleep.  He had not been given his Mucinex or nasal spray today.  EMS reports the patient's lungs were clear and the patient had to be awakened from sleep to bring to the ED.  On arrival here his breathing has been unlabored with normal breath sounds and 100% oxygen saturation on room air.  He has dementia and his speech is incomprehensible at baseline.   Past Medical History:  Diagnosis Date  . BPH (benign prostatic hyperplasia)   . Chronic kidney disease    ckd stage 3  . Dementia ([place])   . GERD (gastroesophageal reflux disease)   . Hemorrhoids   . History of angina   . Hyperlipemia   . Hypertension   . Irregular heart rhythm   . Paroxysmal A-fib ([place]) 04/02/2015    Past Surgical History:  Procedure Laterality Date  . BACK SURGERY    . CERVICAL SPINE SURGERY    . SPINE SURGERY      Family History  Problem Relation Age of Onset  . Stroke Mother   . Hypertension Sister   . Other Son        Homicide, stabbed  . Stroke Son   . Hypertension Son   . Diabetes Son   . Hypertension Son     Social History   Tobacco Use  . Smoking status: Former Smoker    Packs/day: 0.50    Years: 10.00    Pack years: 5.00    Types: Cigarettes  . Smokeless tobacco: Never Used  . Tobacco comment: quit smoking  around 1996  Vaping Use  . Vaping Use: Never used  Substance Use Topics  . Alcohol use: No    Comment: hx of occasional ETOH use  . Drug use: No    Prior to Admission medications   Medication Sig Start Date End Date Taking? Authorizing Provider  acetaminophen  (TYLENOL) 500 MG tablet Take 500 mg by mouth every 4 (four) hours as needed for headache (minor discomfort, fever 99.5-101F).     [provider]  alum & mag hydroxide-simeth ([place]) 200-200-20 MG/5ML suspension Take 30 mLs by mouth every 6 (six) hours as needed for indigestion or heartburn.    [provider]  amiodarone (PACERONE) 200 MG tablet Take 1 tablet (200 mg total) by mouth daily. 10/20/16   [name], NP  amLODipine (NORVASC) 10 MG tablet Take 1 tablet (10 mg total) by mouth daily. 10/20/16   [name], NP  aspirin EC 81 MG tablet Take 81 mg by mouth daily. For CAD    [provider]  cyanocobalamin 1000 MCG tablet Take 1,000 mcg by mouth daily. Vitamin B12    [provider]  ferrous sulfate 325 (65 FE) MG tablet Take 325 mg by mouth daily. For Anemia    [provider]  guaifenesin (ROBAFEN) 100 MG/5ML syrup Take 200 mg by mouth every 6 (six) hours as needed for cough.    [provider]  hydrALAZINE (APRESOLINE) 100 MG tablet Take 1 tablet (100 mg total) by mouth 3 (three) times daily. 10/20/16  [name], NP  loperamide (IMODIUM) 2 MG capsule Take 2 mg by mouth as needed for diarrhea or loose stools (not to exceed 8 doses in 24 hours).    [provider]  magnesium hydroxide (MILK OF MAGNESIA) 400 MG/5ML suspension Take 30 mLs by mouth daily as needed for mild constipation.    [provider]  Melatonin 3 MG TABS Take 3 mg by mouth at bedtime. For sleep    [provider]  memantine (NAMENDA) 5 MG tablet Take 5 mg by mouth daily. For Dementia    [provider]  metoprolol succinate (TOPROL-XL) 25 MG 24 hr tablet Take 1 tablet (25 mg total) by mouth daily. 08/24/16   [name], NP  Neomycin-Bacitracin-Polymyxin (TRIPLE ANTIBIOTIC) [phone] OINT Apply 1 application topically 4 (four) times daily as needed (minor skin tears or abrasions). Clean area with normal  saline, apply triple antibiotic, cover with bandaid or gauze and tape, change as needed until healed.    [provider]  omeprazole (PRILOSEC) 20 MG capsule Take 20 mg by mouth daily at 6 (six) AM. For GERD    [provider]  polyethylene glycol (MIRALAX) 17 g packet Take 17 g by mouth daily. 06/19/19   Dorie Rank, MD  QUEtiapine (SEROQUEL) 25 MG tablet Take 0.5 tablets (12.5 mg total) by mouth at bedtime. 04/27/18   Ghimire, Henreitta Leber, MD  sodium bicarbonate 650 MG tablet Take 1 tablet (650 mg total) by mouth 2 (two) times daily. 09/22/16   Barton Dubois, MD  tamsulosin (FLOMAX) 0.4 MG CAPS capsule TAKE 1 CAPSULE(0.4 MG) BY MOUTH DAILY AFTER SUPPER Patient taking differently: Take 0.4 mg by mouth at bedtime.  06/21/16   [name], NP  Vitamin D, Cholecalciferol, 25 MCG (1000 UT) CAPS Take 1,000 Units by mouth daily.    [provider]    Allergies Patient has no known allergies.   REVIEW OF SYSTEMS  Cannot assess due to dementia.   PHYSICAL EXAMINATION  Initial Vital Signs Blood pressure (!) 166/60, pulse (!) 57, temperature 97.9 F (36.6 C), temperature source Oral, resp. rate 19, height '6\' 2"'$  (1.88 m), weight 81 kg, SpO2 100 %.  Examination General: Well-developed, well-nourished male in no acute distress; appearance consistent with age of record HENT: normocephalic; atraumatic Eyes: Right pupil pinpoint; left pupil round and reactive to light; arcus senilis bilaterally; Neck: supple Heart: regular rate and rhythm; bradycardia Lungs: clear to auscultation bilaterally; no wheezing; no stridor Abdomen: soft; nondistended; nontender; bowel sounds present Extremities: No deformity; full range of motion; pulses normal Neurologic: Awake, alert, incomprehensible speech; motor function intact in all extremities and symmetric; no facial droop Skin: Warm and dry Psychiatric: Flat affect   RESULTS  Summary of this visit's results, reviewed and  interpreted by myself:   EKG Interpretation  Date/Time:  Monday August 31 2020 22:00:57 EDT Ventricular Rate:  56 PR Interval:  225 QRS Duration: 121 QT Interval:  462 QTC Calculation: 446 R Axis:   -28 Text Interpretation: Sinus rhythm Borderline prolonged PR interval Left bundle branch block Rate is slower Confirmed by Yuya Vanwingerden [phone]) on 08/31/2020 11:36:32 PM      Laboratory Studies: No results found for this or any previous visit (from the past 24 hour(s)). Imaging Studies: No results found.  ED COURSE and MDM  Nursing notes, initial and subsequent vitals signs, including pulse oximetry, reviewed and interpreted by myself.  Vitals:   08/31/20 2157 08/31/20 2158 08/31/20 2201 08/31/20 2245  BP:  (!) 150/70  (!) 166/60  Pulse:  (!) 56  (!) 57  Resp:  19  19  Temp:  97.9 F (36.6 C)    TempSrc:  Oral    SpO2: 98% 100%  100%  Weight:   81 kg   Height:   '6\' 2"'$  (1.88 m)    Medications - No data to display  The patient has a reassuring physical examination.  There is no evidence of any acute respiratory difficulty.  Respiration rate is normal and oxygen saturation is 100% on room air.  EKG is mildly bradycardic with left bundle branch block consistent with patient's previous known left bundle branch block.  I do not see any need for additional work-up or hospitalization.  PROCEDURES  Procedures   ED DIAGNOSES     ICD-10-CM   1. Concern about respiratory disease without diagnosis  Z71.1        Teandre Hamre, MD 08/31/20 2341

## 2020-09-01 NOTE — ED Notes (Signed)
[facility] arrived for pt transport back to nursing home

## 2020-09-01 NOTE — ED Notes (Signed)
Report called to [facility]

## 2020-09-03 ENCOUNTER — Inpatient Hospital Stay (HOSPITAL_COMMUNITY): Payer: Medicare Other

## 2020-09-03 ENCOUNTER — Emergency Department (HOSPITAL_COMMUNITY): Payer: Medicare Other

## 2020-09-03 ENCOUNTER — Inpatient Hospital Stay (HOSPITAL_COMMUNITY)
Admission: EM | Admit: 2020-09-03 | Discharge: 2020-09-17 | DRG: 186 | Disposition: A | Discharge status: Hospice | Payer: Medicare Other | Attending: Internal Medicine | Admitting: Internal Medicine

## 2020-09-03 DIAGNOSIS — Z8249 Family history of ischemic heart disease and other diseases of the circulatory system: Secondary | ICD-10-CM

## 2020-09-03 DIAGNOSIS — F0391 Unspecified dementia with behavioral disturbance: Secondary | ICD-10-CM | POA: Diagnosis present

## 2020-09-03 DIAGNOSIS — I480 Paroxysmal atrial fibrillation: Secondary | ICD-10-CM | POA: Diagnosis present

## 2020-09-03 DIAGNOSIS — Z66 Do not resuscitate: Secondary | ICD-10-CM | POA: Diagnosis present

## 2020-09-03 DIAGNOSIS — Z7189 Other specified counseling: Secondary | ICD-10-CM | POA: Diagnosis not present

## 2020-09-03 DIAGNOSIS — I129 Hypertensive chronic kidney disease with stage 1 through stage 4 chronic kidney disease, or unspecified chronic kidney disease: Secondary | ICD-10-CM | POA: Diagnosis present

## 2020-09-03 DIAGNOSIS — N179 Acute kidney failure, unspecified: Secondary | ICD-10-CM | POA: Diagnosis present

## 2020-09-03 DIAGNOSIS — Z515 Encounter for palliative care: Secondary | ICD-10-CM | POA: Diagnosis not present

## 2020-09-03 DIAGNOSIS — Z7982 Long term (current) use of aspirin: Secondary | ICD-10-CM

## 2020-09-03 DIAGNOSIS — Z79899 Other long term (current) drug therapy: Secondary | ICD-10-CM

## 2020-09-03 DIAGNOSIS — D631 Anemia in chronic kidney disease: Secondary | ICD-10-CM | POA: Diagnosis present

## 2020-09-03 DIAGNOSIS — E872 Acidosis: Secondary | ICD-10-CM | POA: Diagnosis not present

## 2020-09-03 DIAGNOSIS — F0280 Dementia in other diseases classified elsewhere without behavioral disturbance: Secondary | ICD-10-CM | POA: Diagnosis not present

## 2020-09-03 DIAGNOSIS — F0281 Dementia in other diseases classified elsewhere with behavioral disturbance: Secondary | ICD-10-CM | POA: Diagnosis not present

## 2020-09-03 DIAGNOSIS — J9601 Acute respiratory failure with hypoxia: Secondary | ICD-10-CM | POA: Diagnosis present

## 2020-09-03 DIAGNOSIS — Z823 Family history of stroke: Secondary | ICD-10-CM | POA: Diagnosis not present

## 2020-09-03 DIAGNOSIS — R471 Dysarthria and anarthria: Secondary | ICD-10-CM | POA: Diagnosis not present

## 2020-09-03 DIAGNOSIS — I10 Essential (primary) hypertension: Secondary | ICD-10-CM | POA: Diagnosis present

## 2020-09-03 DIAGNOSIS — G249 Dystonia, unspecified: Secondary | ICD-10-CM

## 2020-09-03 DIAGNOSIS — N184 Chronic kidney disease, stage 4 (severe): Secondary | ICD-10-CM | POA: Diagnosis present

## 2020-09-03 DIAGNOSIS — E875 Hyperkalemia: Secondary | ICD-10-CM | POA: Diagnosis present

## 2020-09-03 DIAGNOSIS — Z87891 Personal history of nicotine dependence: Secondary | ICD-10-CM | POA: Diagnosis not present

## 2020-09-03 DIAGNOSIS — N400 Enlarged prostate without lower urinary tract symptoms: Secondary | ICD-10-CM | POA: Diagnosis present

## 2020-09-03 DIAGNOSIS — R1310 Dysphagia, unspecified: Secondary | ICD-10-CM | POA: Diagnosis present

## 2020-09-03 DIAGNOSIS — R531 Weakness: Secondary | ICD-10-CM

## 2020-09-03 DIAGNOSIS — J189 Pneumonia, unspecified organism: Secondary | ICD-10-CM | POA: Diagnosis present

## 2020-09-03 DIAGNOSIS — E860 Dehydration: Secondary | ICD-10-CM | POA: Diagnosis not present

## 2020-09-03 DIAGNOSIS — G301 Alzheimer's disease with late onset: Secondary | ICD-10-CM | POA: Diagnosis not present

## 2020-09-03 DIAGNOSIS — J90 Pleural effusion, not elsewhere classified: Secondary | ICD-10-CM | POA: Diagnosis present

## 2020-09-03 DIAGNOSIS — F03918 Unspecified dementia, unspecified severity, with other behavioral disturbance: Secondary | ICD-10-CM | POA: Diagnosis present

## 2020-09-03 DIAGNOSIS — E785 Hyperlipidemia, unspecified: Secondary | ICD-10-CM | POA: Diagnosis present

## 2020-09-03 DIAGNOSIS — Z20822 Contact with and (suspected) exposure to covid-19: Secondary | ICD-10-CM | POA: Diagnosis present

## 2020-09-03 DIAGNOSIS — J984 Other disorders of lung: Secondary | ICD-10-CM

## 2020-09-03 DIAGNOSIS — W19XXXA Unspecified fall, initial encounter: Secondary | ICD-10-CM

## 2020-09-03 DIAGNOSIS — Z833 Family history of diabetes mellitus: Secondary | ICD-10-CM

## 2020-09-03 DIAGNOSIS — K219 Gastro-esophageal reflux disease without esophagitis: Secondary | ICD-10-CM | POA: Diagnosis present

## 2020-09-03 DIAGNOSIS — Z993 Dependence on wheelchair: Secondary | ICD-10-CM

## 2020-09-03 LAB — COMPREHENSIVE METABOLIC PANEL
ALT: 21 U/L (ref 0–44)
AST: 25 U/L (ref 15–41)
Albumin: 3.3 g/dL — ABNORMAL LOW (ref 3.5–5.0)
Alkaline Phosphatase: 86 U/L (ref 38–126)
Anion gap: 6 (ref 5–15)
BUN: 61 mg/dL — ABNORMAL HIGH (ref 8–23)
CO2: 17 mmol/L — ABNORMAL LOW (ref 22–32)
Calcium: 8.4 mg/dL — ABNORMAL LOW (ref 8.9–10.3)
Chloride: 116 mmol/L — ABNORMAL HIGH (ref 98–111)
Creatinine, Ser: 4.06 mg/dL — ABNORMAL HIGH (ref 0.61–1.24)
GFR, Estimated: 13 mL/min — ABNORMAL LOW (ref >60)
Glucose, Bld: 101 mg/dL — ABNORMAL HIGH (ref 70–99)
Potassium: 7.3 mmol/L (ref 3.5–5.1)
Sodium: 139 mmol/L (ref 135–145)
Total Bilirubin: 0.6 mg/dL (ref 0.3–1.2)
Total Protein: 7.2 g/dL (ref 6.5–8.1)

## 2020-09-03 LAB — CBC
HCT: 27.1 % — ABNORMAL LOW (ref 39.0–52.0)
Hemoglobin: 7.8 g/dL — ABNORMAL LOW (ref 13.0–17.0)
MCH: 32.8 pg (ref 26.0–34.0)
MCHC: 28.8 g/dL — ABNORMAL LOW (ref 30.0–36.0)
MCV: 113.9 fL — ABNORMAL HIGH (ref 80.0–100.0)
Platelets: 264 10*3/uL (ref 150–400)
RBC: 2.38 MIL/uL — ABNORMAL LOW (ref 4.22–5.81)
RDW: 15.8 % — ABNORMAL HIGH (ref 11.5–15.5)
WBC: 5 10*3/uL (ref 4.0–10.5)
nRBC: 0.4 % — ABNORMAL HIGH (ref 0.0–0.2)

## 2020-09-03 LAB — CBG MONITORING, ED: Glucose-Capillary: 47 mg/dL — ABNORMAL LOW (ref 70–99)

## 2020-09-03 LAB — RESP PANEL BY RT-PCR (FLU A&B, COVID) ARPGX2
Influenza A by PCR: NEGATIVE
Influenza B by PCR: NEGATIVE
SARS Coronavirus 2 by RT PCR: NEGATIVE

## 2020-09-03 LAB — BRAIN NATRIURETIC PEPTIDE: B Natriuretic Peptide: 278.2 pg/mL — ABNORMAL HIGH (ref 0.0–100.0)

## 2020-09-03 MED ORDER — AMIODARONE HCL 200 MG PO TABS
200.0000 mg | ORAL_TABLET | Freq: Every day | ORAL | Status: DC
  Administered 2020-09-04 – 2020-09-16 (×13): 200 mg via ORAL
  Filled 2020-09-03 (×14): qty 1

## 2020-09-03 MED ORDER — METOPROLOL SUCCINATE ER 25 MG PO TB24
25.0000 mg | ORAL_TABLET | Freq: Every day | ORAL | Status: DC
  Administered 2020-09-04 – 2020-09-16 (×13): 25 mg via ORAL
  Filled 2020-09-03 (×14): qty 1

## 2020-09-03 MED ORDER — AMLODIPINE BESYLATE 10 MG PO TABS
10.0000 mg | ORAL_TABLET | Freq: Every day | ORAL | Status: DC
  Administered 2020-09-04 – 2020-09-16 (×13): 10 mg via ORAL
  Filled 2020-09-03 (×14): qty 1

## 2020-09-03 MED ORDER — CALCIUM GLUCONATE 10 % IV SOLN
1.0000 g | Freq: Once | INTRAVENOUS | Status: AC
  Administered 2020-09-03: 1 g via INTRAVENOUS
  Filled 2020-09-03: qty 10

## 2020-09-03 MED ORDER — DEXTROSE 50 % IV SOLN
25.0000 g | Freq: Once | INTRAVENOUS | Status: AC
  Administered 2020-09-03: 25 g via INTRAVENOUS
  Filled 2020-09-03: qty 50

## 2020-09-03 MED ORDER — MAGNESIUM HYDROXIDE 400 MG/5ML PO SUSP: 30.0000 mL | Freq: Every evening | ORAL | Status: DC | PRN

## 2020-09-03 MED ORDER — IPRATROPIUM BROMIDE HFA 17 MCG/ACT IN AERS
2.0000 | INHALATION_SPRAY | Freq: Once | RESPIRATORY_TRACT | Status: DC
  Filled 2020-09-03: qty 12.9

## 2020-09-03 MED ORDER — SODIUM CHLORIDE 0.9 % IV SOLN
INTRAVENOUS | Status: DC
  Administered 2020-09-04: 100 mL/h via INTRAVENOUS

## 2020-09-03 MED ORDER — ASPIRIN EC 81 MG PO TBEC
81.0000 mg | DELAYED_RELEASE_TABLET | Freq: Every day | ORAL | Status: DC
  Administered 2020-09-04 – 2020-09-11 (×8): 81 mg via ORAL
  Filled 2020-09-03 (×8): qty 1

## 2020-09-03 MED ORDER — SODIUM BICARBONATE 8.4 % IV SOLN
50.0000 meq | Freq: Once | INTRAVENOUS | Status: AC
  Administered 2020-09-03: 50 meq via INTRAVENOUS
  Filled 2020-09-03: qty 50

## 2020-09-03 MED ORDER — HYDRALAZINE HCL 50 MG PO TABS
100.0000 mg | ORAL_TABLET | Freq: Three times a day (TID) | ORAL | Status: DC
  Administered 2020-09-04 – 2020-09-16 (×38): 100 mg via ORAL
  Filled 2020-09-03 (×40): qty 2

## 2020-09-03 MED ORDER — SODIUM BICARBONATE 650 MG PO TABS
650.0000 mg | ORAL_TABLET | Freq: Two times a day (BID) | ORAL | Status: DC
  Administered 2020-09-04 – 2020-09-17 (×28): 650 mg via ORAL
  Filled 2020-09-03 (×29): qty 1

## 2020-09-03 MED ORDER — TAMSULOSIN HCL 0.4 MG PO CAPS
0.4000 mg | ORAL_CAPSULE | Freq: Every day | ORAL | Status: DC
  Administered 2020-09-04 – 2020-09-17 (×14): 0.4 mg via ORAL
  Filled 2020-09-03 (×14): qty 1

## 2020-09-03 MED ORDER — DEXTROSE 50 % IV SOLN
INTRAVENOUS | Status: AC
  Filled 2020-09-03: qty 50

## 2020-09-03 MED ORDER — MEMANTINE HCL 5 MG PO TABS
5.0000 mg | ORAL_TABLET | Freq: Every day | ORAL | Status: DC
  Administered 2020-09-04 – 2020-09-08 (×5): 5 mg via ORAL
  Filled 2020-09-03 (×5): qty 1

## 2020-09-03 MED ORDER — QUETIAPINE FUMARATE 25 MG PO TABS
12.5000 mg | ORAL_TABLET | Freq: Every day | ORAL | Status: DC
  Administered 2020-09-04 – 2020-09-08 (×5): 12.5 mg via ORAL
  Filled 2020-09-03 (×6): qty 1

## 2020-09-03 MED ORDER — ALBUTEROL SULFATE (2.5 MG/3ML) 0.083% IN NEBU
5.0000 mg | INHALATION_SOLUTION | Freq: Once | RESPIRATORY_TRACT | Status: AC
  Administered 2020-09-03: 5 mg via RESPIRATORY_TRACT
  Filled 2020-09-03: qty 6

## 2020-09-03 MED ORDER — IPRATROPIUM BROMIDE 0.02 % IN SOLN
0.5000 mg | Freq: Once | RESPIRATORY_TRACT | Status: AC
  Administered 2020-09-03: 0.5 mg via RESPIRATORY_TRACT
  Filled 2020-09-03: qty 2.5

## 2020-09-03 MED ORDER — FERROUS SULFATE 325 (65 FE) MG PO TABS
325.0000 mg | ORAL_TABLET | Freq: Every day | ORAL | Status: DC
  Administered 2020-09-04 – 2020-09-11 (×8): 325 mg via ORAL
  Filled 2020-09-03 (×9): qty 1

## 2020-09-03 MED ORDER — SODIUM CHLORIDE 0.9 % IV SOLN
500.0000 mg | Freq: Once | INTRAVENOUS | Status: AC
  Administered 2020-09-03: 500 mg via INTRAVENOUS
  Filled 2020-09-03: qty 500

## 2020-09-03 MED ORDER — HEPARIN SODIUM (PORCINE) 5000 UNIT/ML IJ SOLN
5000.0000 [IU] | Freq: Three times a day (TID) | INTRAMUSCULAR | Status: DC
  Administered 2020-09-04 – 2020-09-11 (×20): 5000 [IU] via SUBCUTANEOUS
  Filled 2020-09-03 (×21): qty 1

## 2020-09-03 MED ORDER — SODIUM CHLORIDE 0.9 % IV SOLN
500.0000 mg | INTRAVENOUS | Status: DC
  Administered 2020-09-04 – 2020-09-06 (×3): 500 mg via INTRAVENOUS
  Filled 2020-09-03 (×3): qty 500

## 2020-09-03 MED ORDER — ALBUTEROL SULFATE HFA 108 (90 BASE) MCG/ACT IN AERS
4.0000 | INHALATION_SPRAY | Freq: Once | RESPIRATORY_TRACT | Status: DC
  Filled 2020-09-03: qty 6.7

## 2020-09-03 MED ORDER — SALINE SPRAY 0.65 % NA SOLN
2.0000 | Freq: Every day | NASAL | Status: DC
  Administered 2020-09-04 – 2020-09-17 (×14): 2 via NASAL
  Filled 2020-09-03: qty 44

## 2020-09-03 MED ORDER — INSULIN ASPART 100 UNIT/ML ~~LOC~~ SOLN
6.0000 [IU] | Freq: Once | SUBCUTANEOUS | Status: AC
  Administered 2020-09-03: 6 [IU] via INTRAVENOUS

## 2020-09-03 MED ORDER — ACETAMINOPHEN 500 MG PO TABS
500.0000 mg | ORAL_TABLET | ORAL | Status: DC | PRN
  Administered 2020-09-05 – 2020-09-14 (×3): 500 mg via ORAL
  Filled 2020-09-03 (×3): qty 1

## 2020-09-03 MED ORDER — FAMOTIDINE 20 MG PO TABS
20.0000 mg | ORAL_TABLET | Freq: Every day | ORAL | Status: DC | PRN
  Administered 2020-09-10: 20 mg via ORAL
  Filled 2020-09-03: qty 1

## 2020-09-03 MED ORDER — MELATONIN 3 MG PO TABS
3.0000 mg | ORAL_TABLET | Freq: Every day | ORAL | Status: DC
  Administered 2020-09-04 – 2020-09-17 (×14): 3 mg via ORAL
  Filled 2020-09-03 (×15): qty 1

## 2020-09-03 MED ORDER — SODIUM CHLORIDE 0.9 % IV SOLN
2.0000 g | INTRAVENOUS | Status: AC
  Administered 2020-09-04 – 2020-09-08 (×4): 2 g via INTRAVENOUS
  Filled 2020-09-03 (×5): qty 20

## 2020-09-03 MED ORDER — DEXTROSE 50 % IV SOLN
1.0000 | Freq: Once | INTRAVENOUS | Status: AC
  Administered 2020-09-03: 50 mL via INTRAVENOUS

## 2020-09-03 MED ORDER — SODIUM CHLORIDE 0.9 % IV BOLUS
1000.0000 mL | Freq: Once | INTRAVENOUS | Status: AC
  Administered 2020-09-03: 1000 mL via INTRAVENOUS

## 2020-09-03 MED ORDER — CEFTRIAXONE SODIUM 1 G IJ SOLR
1.0000 g | Freq: Once | INTRAMUSCULAR | Status: AC
  Administered 2020-09-03: 1 g via INTRAVENOUS
  Filled 2020-09-03: qty 10

## 2020-09-03 MED ORDER — POLYETHYLENE GLYCOL 3350 17 G PO PACK
17.0000 g | PACK | Freq: Every day | ORAL | Status: DC
  Administered 2020-09-04 – 2020-09-17 (×8): 17 g via ORAL
  Filled 2020-09-03 (×11): qty 1

## 2020-09-03 MED ORDER — VITAMIN D 25 MCG (1000 UNIT) PO TABS
1000.0000 [IU] | ORAL_TABLET | Freq: Every day | ORAL | Status: DC
  Administered 2020-09-04 – 2020-09-11 (×8): 1000 [IU] via ORAL
  Filled 2020-09-03 (×8): qty 1

## 2020-09-03 MED ORDER — BACITRACIN-NEOMYCIN-POLYMYXIN 400-5-5000 EX OINT: 1.0000 "application " | TOPICAL_OINTMENT | CUTANEOUS | Status: DC | PRN

## 2020-09-03 MED ORDER — SODIUM ZIRCONIUM CYCLOSILICATE 10 G PO PACK
10.0000 g | PACK | Freq: Once | ORAL | Status: AC
  Administered 2020-09-03: 10 g via ORAL
  Filled 2020-09-03: qty 1

## 2020-09-03 MED ORDER — GUAIFENESIN 100 MG/5ML PO SOLN
200.0000 mg | Freq: Four times a day (QID) | ORAL | Status: DC | PRN
  Filled 2020-09-03: qty 10

## 2020-09-03 NOTE — ED Notes (Signed)
MD Ashok Cordia made aware of 7.3 Potassium lab value.

## 2020-09-03 NOTE — ED Triage Notes (Signed)
Pt bib EMS from [facility] facility for reports of altered mental status per facility and daughter. EMS was called out around 1230 and pt was deemed stable. EMS called again around 1930 for same reports. Per facility pts breathing was off. On triage pt is wheezing and has shortness of breath with exertion.  Pt has Hx of dementia and is non-ambulatory.  Vitals:  BP: 166/60 HR: 54 O2: 99% RA Temp: 97.8

## 2020-09-03 NOTE — ED Notes (Signed)
Wayne Fuller, Wayne Fuller, [phone] would like an update when available

## 2020-09-03 NOTE — ED Provider Notes (Signed)
[facility] EMERGENCY DEPARTMENT Provider Note   CSN: [id number] Arrival date & time: 09/03/20  2007     History Chief Complaint  Patient presents with  . Altered Mental Status    Wayne Fuller is a 87 y.o. male.  Patient with hx dementia, from ecf via ems with report of breathing sounding congested, non prod cough, and wheezing. Symptoms presents in past few days. Acute onset, moderate, constant, persistent. Pt w advanced dementia - level 5 caveat. No recorded fevers. No report of trauma/fall. Family denies hx asthma or copd. Non smoker. No report of chest pain.   The history is provided by the patient, a relative and the EMS personnel. The history is limited by the condition of the patient.  Altered Mental Status      Past Medical History:  Diagnosis Date  . BPH (benign prostatic hyperplasia)   . Chronic kidney disease    ckd stage 3  . Dementia ([place])   . GERD (gastroesophageal reflux disease)   . Hemorrhoids   . History of angina   . Hyperlipemia   . Hypertension   . Irregular heart rhythm   . Paroxysmal A-fib ([place]) 04/02/2015    Patient Active Problem List   Diagnosis Date Noted  . HCAP (healthcare-associated pneumonia) 04/22/2018  . Hypoglycemia 04/22/2018  . S/p left hip fracture 09/29/2016  . Dehydration   . 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 Dislocation of internal left hip prosthesis ([place]) 09/21/2016  . Hyperkalemia 09/21/2016  . Protein-calorie malnutrition, severe 09/21/2016  . Anemia, chronic disease 01/30/2016  . Acute kidney injury superimposed on CKD ([place]) 01/30/2016  . Dementia with behavioral disturbance ([place]) 01/19/2016  . Back pain 01/19/2016  . Elevated troponin 04/02/2015  . Paroxysmal A-fib ([place]) 04/02/2015  . CKD (chronic kidney disease) stage 4, GFR 15-29 ml/min ([facility]) 08/11/2014  . GERD (gastroesophageal reflux disease) 08/11/2014  . Essential hypertension 04/29/2009  . CAD  (coronary artery disease) 04/29/2009  . TOBACCO ABUSE, HX OF 04/29/2009    Past Surgical History:  Procedure Laterality Date  . BACK SURGERY    . CERVICAL SPINE SURGERY    . SPINE SURGERY         Family History  Problem Relation Age of Onset  . Stroke Mother   . Hypertension Sister   . Other Son        Homicide, stabbed  . Stroke Son   . Hypertension Son   . Diabetes Son   . Hypertension Son     Social History   Tobacco Use  . Smoking status: Former Smoker    Packs/day: 0.50    Years: 10.00    Pack years: 5.00    Types: Cigarettes  . Smokeless tobacco: Never Used  . Tobacco comment: quit smoking  around 1996  Vaping Use  . Vaping Use: Never used  Substance Use Topics  . Alcohol use: No    Comment: hx of occasional ETOH use  . Drug use: No    Home Medications Prior to Admission medications   Medication Sig Start Date End Date Taking? Authorizing Provider  acetaminophen (TYLENOL) 500 MG tablet Take 500 mg by mouth every 4 (four) hours as needed for headache (minor discomfort, fever 99.5-101F).     [provider]  alum & mag hydroxide-simeth ([place]) 200-200-20 MG/5ML suspension Take 30 mLs by mouth every 6 (six) hours as needed for indigestion or heartburn.    [provider]  amiodarone (PACERONE) 200  MG tablet Take 1 tablet (200 mg total) by mouth daily. 10/20/16   [name], NP  amLODipine (NORVASC) 10 MG tablet Take 1 tablet (10 mg total) by mouth daily. 10/20/16   [name], NP  aspirin EC 81 MG tablet Take 81 mg by mouth daily. For CAD    [provider]  cyanocobalamin 1000 MCG tablet Take 1,000 mcg by mouth daily. Vitamin B12    [provider]  ferrous sulfate 325 (65 FE) MG tablet Take 325 mg by mouth daily. For Anemia    [provider]  guaifenesin (ROBAFEN) 100 MG/5ML syrup Take 200 mg by mouth every 6 (six) hours as needed for cough.    [provider]  hydrALAZINE (APRESOLINE) 100  MG tablet Take 1 tablet (100 mg total) by mouth 3 (three) times daily. 10/20/16   [name], NP  loperamide (IMODIUM) 2 MG capsule Take 2 mg by mouth as needed for diarrhea or loose stools (not to exceed 8 doses in 24 hours).    [provider]  magnesium hydroxide (MILK OF MAGNESIA) 400 MG/5ML suspension Take 30 mLs by mouth daily as needed for mild constipation.    [provider]  Melatonin 3 MG TABS Take 3 mg by mouth at bedtime. For sleep    [provider]  memantine (NAMENDA) 5 MG tablet Take 5 mg by mouth daily. For Dementia    [provider]  metoprolol succinate (TOPROL-XL) 25 MG 24 hr tablet Take 1 tablet (25 mg total) by mouth daily. 08/24/16   [name], NP  Neomycin-Bacitracin-Polymyxin (TRIPLE ANTIBIOTIC) [phone] OINT Apply 1 application topically 4 (four) times daily as needed (minor skin tears or abrasions). Clean area with normal saline, apply triple antibiotic, cover with bandaid or gauze and tape, change as needed until healed.    [provider]  omeprazole (PRILOSEC) 20 MG capsule Take 20 mg by mouth daily at 6 (six) AM. For GERD    [provider]  polyethylene glycol (MIRALAX) 17 g packet Take 17 g by mouth daily. 06/19/19   Dorie Rank, MD  QUEtiapine (SEROQUEL) 25 MG tablet Take 0.5 tablets (12.5 mg total) by mouth at bedtime. 04/27/18   Ghimire, Henreitta Leber, MD  sodium bicarbonate 650 MG tablet Take 1 tablet (650 mg total) by mouth 2 (two) times daily. 09/22/16   Barton Dubois, MD  tamsulosin (FLOMAX) 0.4 MG CAPS capsule TAKE 1 CAPSULE(0.4 MG) BY MOUTH DAILY AFTER SUPPER Patient taking differently: Take 0.4 mg by mouth at bedtime.  06/21/16   [name], NP  Vitamin D, Cholecalciferol, 25 MCG (1000 UT) CAPS Take 1,000 Units by mouth daily.    [provider]    Allergies    Patient has no known allergies.  Review of Systems   Review of Systems  Unable to perform ROS: Dementia  level 5  caveat - dementia    Physical Exam Updated Vital Signs BP 123/66   Pulse (!) 57   Temp 98.2 F (36.8 C) (Oral)   Resp (!) 28   SpO2 96%   Physical Exam Vitals and nursing note reviewed.  Constitutional:      Appearance: Normal appearance. He is well-developed.  HENT:     Head: Atraumatic.     Nose: Nose normal.     Mouth/Throat:     Mouth: Mucous membranes are moist.     Pharynx: Oropharynx is clear.  Eyes:     General: No  scleral icterus.    Conjunctiva/sclera: Conjunctivae normal.  Neck:     Trachea: No tracheal deviation.  Cardiovascular:     Rate and Rhythm: Normal rate and regular rhythm.     Pulses: Normal pulses.     Heart sounds: Normal heart sounds. No murmur heard. No friction rub. No gallop.   Pulmonary:     Effort: No accessory muscle usage.     Breath sounds: Wheezing and rhonchi present.  Abdominal:     General: Bowel sounds are normal. There is no distension.     Palpations: Abdomen is soft.     Tenderness: There is no abdominal tenderness. There is no guarding.  Genitourinary:    Comments: No cva tenderness. Musculoskeletal:        General: No swelling or tenderness.     Cervical back: Normal range of motion and neck supple. No rigidity.     Right lower leg: No edema.     Left lower leg: No edema.  Skin:    General: Skin is warm and dry.     Findings: No rash.  Neurological:     Mental Status: He is alert.     Comments: Alert, mental status reported as c/w baseline. Moves extremities purposefully.   Psychiatric:        Mood and Affect: Mood normal.     ED Results / Procedures / Treatments   Labs (all labs ordered are listed, but only abnormal results are displayed) Results for orders placed or performed during the hospital encounter of 09/03/20  Resp Panel by RT-PCR (Flu A&B, Covid) Nasopharyngeal Swab   Specimen: Nasopharyngeal Swab; Nasopharyngeal(NP) swabs in vial transport medium  Result Value Ref Range   SARS Coronavirus 2 by RT  PCR NEGATIVE NEGATIVE   Influenza A by PCR NEGATIVE NEGATIVE   Influenza B by PCR NEGATIVE NEGATIVE  Comprehensive metabolic panel  Result Value Ref Range   Sodium 139 135 - 145 mmol/L   Potassium 7.3 (HH) 3.5 - 5.1 mmol/L   Chloride 116 (H) 98 - 111 mmol/L   CO2 17 (L) 22 - 32 mmol/L   Glucose, Bld 101 (H) 70 - 99 mg/dL   BUN 61 (H) 8 - 23 mg/dL   Creatinine, Ser 4.06 (H) 0.61 - 1.24 mg/dL   Calcium 8.4 (L) 8.9 - 10.3 mg/dL   Total Protein 7.2 6.5 - 8.1 g/dL   Albumin 3.3 (L) 3.5 - 5.0 g/dL   AST 25 15 - 41 U/L   ALT 21 0 - 44 U/L   Alkaline Phosphatase 86 38 - 126 U/L   Total Bilirubin 0.6 0.3 - 1.2 mg/dL   GFR, Estimated 13 (L) >60 mL/min   Anion gap 6 5 - 15  CBC  Result Value Ref Range   WBC 5.0 4.0 - 10.5 K/uL   RBC 2.38 (L) 4.22 - 5.81 MIL/uL   Hemoglobin 7.8 (L) 13.0 - 17.0 g/dL   HCT 27.1 (L) 39.0 - 52.0 %   MCV 113.9 (H) 80.0 - 100.0 fL   MCH 32.8 26.0 - 34.0 pg   MCHC 28.8 (L) 30.0 - 36.0 g/dL   RDW 15.8 (H) 11.5 - 15.5 %   Platelets 264 150 - 400 K/uL   nRBC 0.4 (H) 0.0 - 0.2 %  Brain natriuretic peptide  Result Value Ref Range   B Natriuretic Peptide 278.2 (H) 0.0 - 100.0 pg/mL    EKG EKG Interpretation  Date/Time:  Thursday September 03 2020 20:37:07 EDT Ventricular Rate:  55 PR Interval:    QRS Duration: 122 QT Interval:  431 QTC Calculation: 413 R Axis:   -34 Text Interpretation: Sinus rhythm with first degree AV block Left bundle branch block Nonspecific T wave abnormality `peaked t waves Non-specific ST-t changes Confirmed by Lajean Saver [phone]) on 09/03/2020 10:12:17 PM   Radiology DG Chest Port 1 View  Result Date: 09/03/2020 CLINICAL DATA:  Altered mental status. EXAM: PORTABLE CHEST 1 VIEW COMPARISON:  June 19, 2019 FINDINGS: There is opacification of the left lung base and mid to lower left lung. A moderate size left pleural effusion is seen. No pneumothorax is identified. The cardiac silhouette is moderately enlarged. There is moderate  severity calcification of the aortic arch. Degenerative changes seen throughout the thoracic spine. IMPRESSION: 1. Cardiomegaly with additional findings likely consistent with marked severity left basilar atelectasis and/or infiltrate. Follow-up to resolution is recommended, as an underlying neoplastic process cannot be excluded. 2. Moderate sized left-sided pleural effusion. Electronically Signed   By: Virgina Norfolk M.D.   On: 09/03/2020 20:42    Procedures Procedures   Medications Ordered in ED Medications  albuterol (VENTOLIN HFA) 108 (90 Base) MCG/ACT inhaler 4 puff (has no administration in time range)  ipratropium (ATROVENT HFA) inhaler 2 puff (has no administration in time range)    ED Course  I have reviewed the triage vital signs and the nursing notes.  Pertinent labs & imaging results that were available during my care of the patient were reviewed by me and considered in my medical decision making (see chart for details).    MDM Rules/Calculators/A&P                         Iv ns. Labs and imaging ordered. Continuous pulse ox and cardiac monitoring. Albuterol and atrovent breathing treatment given.   Reviewed nursing notes and prior charts for additional history.   Labs reviewed/interpreted by me - chem w k very high, aki. Cal gluconate iv, hco3 iv, d50 iv, insulin Iv, lokelma po.   CXR reviewed/interpreted by me - left lower atel vs infiltrate r/o pna r/o mass. +effusion.   After cultures sent, iv abx given.   CT without contrast to further eval lung process.   Hospitalists consulted for admission.  CRITICAL CARE RE: general weakness, AKI with severe hyperkalemia, lung pna vs mass w left effusion.  Performed by: Mirna Mires Total critical care time: 45 minutes Critical care time was exclusive of separately billable procedures and treating other patients. Critical care was necessary to treat or prevent imminent or life-threatening deterioration. Critical care  was time spent personally by me on the following activities: development of treatment plan with patient and/or surrogate as well as nursing, discussions with consultants, evaluation of patient's response to treatment, examination of patient, obtaining history from patient or surrogate, ordering and performing treatments and interventions, ordering and review of laboratory studies, ordering and review of radiographic studies, pulse oximetry and re-evaluation of patient's condition.   Final Clinical Impression(s) / ED Diagnoses Final diagnoses:  None    Rx / DC Orders ED Discharge Orders    None       Lajean Saver, MD 09/03/20 2254

## 2020-09-03 NOTE — ED Notes (Signed)
Pt unable to follow commands to utilize inhalers. MD Ashok Cordia made aware.

## 2020-09-04 ENCOUNTER — Inpatient Hospital Stay (HOSPITAL_COMMUNITY): Payer: Medicare Other

## 2020-09-04 ENCOUNTER — Other Ambulatory Visit: Payer: Self-pay

## 2020-09-04 ENCOUNTER — Encounter (HOSPITAL_COMMUNITY): Payer: Self-pay | Admitting: Internal Medicine

## 2020-09-04 DIAGNOSIS — G301 Alzheimer's disease with late onset: Secondary | ICD-10-CM | POA: Diagnosis not present

## 2020-09-04 DIAGNOSIS — N179 Acute kidney failure, unspecified: Secondary | ICD-10-CM | POA: Diagnosis not present

## 2020-09-04 DIAGNOSIS — E875 Hyperkalemia: Secondary | ICD-10-CM

## 2020-09-04 DIAGNOSIS — J90 Pleural effusion, not elsewhere classified: Principal | ICD-10-CM

## 2020-09-04 DIAGNOSIS — I10 Essential (primary) hypertension: Secondary | ICD-10-CM

## 2020-09-04 DIAGNOSIS — F0281 Dementia in other diseases classified elsewhere with behavioral disturbance: Secondary | ICD-10-CM

## 2020-09-04 DIAGNOSIS — N184 Chronic kidney disease, stage 4 (severe): Secondary | ICD-10-CM | POA: Diagnosis not present

## 2020-09-04 HISTORY — PX: IR THORACENTESIS ASP PLEURAL SPACE W/IMG GUIDE: IMG5380

## 2020-09-04 LAB — BASIC METABOLIC PANEL
Anion gap: 5 (ref 5–15)
BUN: 53 mg/dL — ABNORMAL HIGH (ref 8–23)
CO2: 19 mmol/L — ABNORMAL LOW (ref 22–32)
Calcium: 8.1 mg/dL — ABNORMAL LOW (ref 8.9–10.3)
Chloride: 117 mmol/L — ABNORMAL HIGH (ref 98–111)
Creatinine, Ser: 3.79 mg/dL — ABNORMAL HIGH (ref 0.61–1.24)
GFR, Estimated: 14 mL/min — ABNORMAL LOW (ref >60)
Glucose, Bld: 84 mg/dL (ref 70–99)
Potassium: 6.3 mmol/L (ref 3.5–5.1)
Sodium: 141 mmol/L (ref 135–145)

## 2020-09-04 LAB — PROTEIN, PLEURAL OR PERITONEAL FLUID: Total protein, fluid: <3 g/dL

## 2020-09-04 LAB — BODY FLUID CELL COUNT WITH DIFFERENTIAL
Eos, Fluid: 0 %
Lymphs, Fluid: 92 %
Monocyte-Macrophage-Serous Fluid: 7 % — ABNORMAL LOW (ref 50–90)
Neutrophil Count, Fluid: 1 % (ref 0–25)
Total Nucleated Cell Count, Fluid: 148 cu mm (ref 0–1000)

## 2020-09-04 LAB — CBC
HCT: 25.2 % — ABNORMAL LOW (ref 39.0–52.0)
Hemoglobin: 7.3 g/dL — ABNORMAL LOW (ref 13.0–17.0)
MCH: 32.7 pg (ref 26.0–34.0)
MCHC: 29 g/dL — ABNORMAL LOW (ref 30.0–36.0)
MCV: 113 fL — ABNORMAL HIGH (ref 80.0–100.0)
Platelets: 225 10*3/uL (ref 150–400)
RBC: 2.23 MIL/uL — ABNORMAL LOW (ref 4.22–5.81)
RDW: 15.6 % — ABNORMAL HIGH (ref 11.5–15.5)
WBC: 3.8 10*3/uL — ABNORMAL LOW (ref 4.0–10.5)
nRBC: 0 % (ref 0.0–0.2)

## 2020-09-04 LAB — HIV ANTIBODY (ROUTINE TESTING W REFLEX): HIV Screen 4th Generation wRfx: NONREACTIVE

## 2020-09-04 LAB — LACTIC ACID, PLASMA: Lactic Acid, Venous: 1.9 mmol/L (ref 0.5–1.9)

## 2020-09-04 LAB — POTASSIUM
Potassium: 5.7 mmol/L — ABNORMAL HIGH (ref 3.5–5.1)
Potassium: 5.9 mmol/L — ABNORMAL HIGH (ref 3.5–5.1)

## 2020-09-04 LAB — PROTEIN, TOTAL: Total Protein: 6.2 g/dL — ABNORMAL LOW (ref 6.5–8.1)

## 2020-09-04 LAB — LACTATE DEHYDROGENASE, PLEURAL OR PERITONEAL FLUID: LD, Fluid: 119 U/L — ABNORMAL HIGH (ref 3–23)

## 2020-09-04 LAB — LACTATE DEHYDROGENASE: LDH: 212 U/L — ABNORMAL HIGH (ref 98–192)

## 2020-09-04 LAB — PROCALCITONIN: Procalcitonin: <0.1 ng/mL

## 2020-09-04 LAB — ALBUMIN: Albumin: 2.7 g/dL — ABNORMAL LOW (ref 3.5–5.0)

## 2020-09-04 MED ORDER — LIDOCAINE HCL 1 % IJ SOLN
INTRAMUSCULAR | Status: AC
  Filled 2020-09-04: qty 20

## 2020-09-04 MED ORDER — SODIUM CHLORIDE 0.9 % IV SOLN: 1.0000 g | INTRAVENOUS | Status: DC

## 2020-09-04 MED ORDER — SODIUM POLYSTYRENE SULFONATE 15 GM/60ML PO SUSP
15.0000 g | Freq: Once | ORAL | Status: AC
  Administered 2020-09-04: 15 g via ORAL
  Filled 2020-09-04: qty 60

## 2020-09-04 MED ORDER — HALOPERIDOL LACTATE 5 MG/ML IJ SOLN
5.0000 mg | Freq: Four times a day (QID) | INTRAMUSCULAR | Status: DC | PRN
  Administered 2020-09-04 – 2020-09-06 (×4): 5 mg via INTRAVENOUS
  Filled 2020-09-04 (×4): qty 1

## 2020-09-04 MED ORDER — LABETALOL HCL 5 MG/ML IV SOLN: 10.0000 mg | INTRAVENOUS | Status: DC | PRN

## 2020-09-04 MED ORDER — INSULIN ASPART 100 UNIT/ML IV SOLN: 10.0000 [IU] | Freq: Once | INTRAVENOUS | Status: DC

## 2020-09-04 MED ORDER — DEXTROSE 50 % IV SOLN: 1.0000 | Freq: Once | INTRAVENOUS | Status: DC

## 2020-09-04 MED ORDER — SODIUM CHLORIDE 0.9 % IV SOLN: 500.0000 mg | INTRAVENOUS | Status: DC

## 2020-09-04 MED ORDER — RESOURCE THICKENUP CLEAR PO POWD
ORAL | Status: DC | PRN
  Filled 2020-09-04 (×2): qty 125

## 2020-09-04 MED ORDER — GLUCOSE 40 % PO GEL
ORAL | Status: AC
  Administered 2020-09-04: 37.5 g via ORAL
  Filled 2020-09-04: qty 1

## 2020-09-04 NOTE — Progress Notes (Signed)
Placed order for IV consult, was relayed by IV RN, that an IV was placed at [facility] however , Full linen bed change & bath was made and no IV was found to be have been removed or pulled from PT. Advised IV access vital for Antibiotics.

## 2020-09-04 NOTE — H&P (Addendum)
History and Physical    Wayne Fuller [id number] DOB: [birth year] DOA: 09/03/2020  PCP: [name], NP  Patient coming from: SNF  I have personally briefly reviewed patient's old medical records in [place]  Chief Complaint: breathing problems  HPI: Wayne Fuller is a 87 y.o. male with medical history significant of advanced dementia, CKD 4, HTN, PAF not on anticoagulation.  Patient presents to the ED with c/o SOB.  This has been ongoing for past few days.  Worsening, moderate, constant.  Associated generalized weakness.  Also poor PO intake over past week or so.  Had 2 days of N/V/D due to viral illness that was going around the SNF between 1-2 weeks ago according to daughter who is at bedside providing history.  No reported fevers, fall/trauma.  No h/o asthma nor COPD.   ED Course: COVID neg.  Pt with AKF with creat of 4.0 up from 2.6 in Jan 2021, BUN 61, K is 7.3!  Pt does have tall peaked T waves on EKG today.  Hgb 7.8 (Was 7.6 in Oct 2021).  Initially concern for LLL PNA, pt put on rocephin + azithro.  Procalcitonin neg, and CT chest just reveals large L pleural effusion.  Pt given temp measures for hyperkalemia, hospitalist asked to admit.   Review of Systems: As per HPI, otherwise all review of systems negative.  Past Medical History:  Diagnosis Date  . BPH (benign prostatic hyperplasia)   . Chronic kidney disease    ckd stage 3  . Dementia ([place])   . GERD (gastroesophageal reflux disease)   . Hemorrhoids   . History of angina   . Hyperlipemia   . Hypertension   . Irregular heart rhythm   . Paroxysmal A-fib ([place]) 04/02/2015    Past Surgical History:  Procedure Laterality Date  . BACK SURGERY    . CERVICAL SPINE SURGERY    . SPINE SURGERY       reports that he has quit smoking. His smoking use included cigarettes. He has a 5.00 pack-year smoking history. He has never used smokeless tobacco. He reports that he does not drink alcohol and  does not use drugs.  No Known Allergies  Family History  Problem Relation Age of Onset  . Stroke Mother   . Hypertension Sister   . Other Son        Homicide, stabbed  . Stroke Son   . Hypertension Son   . Diabetes Son   . Hypertension Son      Prior to Admission medications   Medication Sig Start Date End Date Taking? Authorizing Provider  acetaminophen (TYLENOL) 500 MG tablet Take 500 mg by mouth every 4 (four) hours as needed for headache (minor discomfort, fever 99.5-101F).    Yes [provider]  alum & mag hydroxide-simeth (MAALOX/MYLANTA) 200-200-20 MG/5ML suspension Take 30 mLs by mouth every 6 (six) hours as needed for indigestion or heartburn.   Yes [provider]  amiodarone (PACERONE) 200 MG tablet Take 1 tablet (200 mg total) by mouth daily. 10/20/16  Yes [name], NP  amLODipine (NORVASC) 10 MG tablet Take 1 tablet (10 mg total) by mouth daily. 10/20/16  Yes [name], NP  aspirin EC 81 MG tablet Take 81 mg by mouth daily. For CAD   Yes [provider]  famotidine (PEPCID) 20 MG tablet Take 20 mg by mouth daily as needed for indigestion.   Yes [provider]  ferrous sulfate 325 (65 FE)  MG tablet Take 325 mg by mouth daily. For Anemia   Yes [provider]  guaifenesin (ROBITUSSIN) 100 MG/5ML syrup Take 200 mg by mouth every 6 (six) hours as needed for cough.   Yes [provider]  hydrALAZINE (APRESOLINE) 100 MG tablet Take 1 tablet (100 mg total) by mouth 3 (three) times daily. 10/20/16  Yes [name], NP  loperamide (IMODIUM) 2 MG capsule Take 2 mg by mouth as needed for diarrhea or loose stools (not to exceed 8 doses in 24 hours).   Yes [provider]  magnesium hydroxide (MILK OF MAGNESIA) 400 MG/5ML suspension Take 30 mLs by mouth at bedtime as needed for mild constipation.   Yes [provider]  Melatonin 3 MG TABS Take 3 mg by mouth at bedtime. For sleep   Yes  [provider]  memantine (NAMENDA) 5 MG tablet Take 5 mg by mouth daily. For Dementia   Yes [provider]  metoprolol succinate (TOPROL-XL) 25 MG 24 hr tablet Take 1 tablet (25 mg total) by mouth daily. 08/24/16  Yes [name], NP  Neomycin-Bacitracin-Polymyxin (TRIPLE ANTIBIOTIC) [phone] OINT Apply 1 application topically as needed (minor skin tears or abrasions). Clean area with normal saline, apply triple antibiotic, cover with bandaid or gauze and tape, change as needed until healed.   Yes [provider]  polyethylene glycol (MIRALAX) 17 g packet Take 17 g by mouth daily. 06/19/19  Yes Dorie Rank, MD  QUEtiapine (SEROQUEL) 25 MG tablet Take 0.5 tablets (12.5 mg total) by mouth at bedtime. 04/27/18  Yes Ghimire, Henreitta Leber, MD  Skin Protectants, Misc. (BAZA PROTECT EX) Apply 1 application topically See admin instructions. Apply to affected area on bottom bid and after each incontinence episode  prn   Yes [provider]  sodium bicarbonate 650 MG tablet Take 1 tablet (650 mg total) by mouth 2 (two) times daily. 09/22/16  Yes Barton Dubois, MD  sodium chloride (OCEAN) 0.65 % SOLN nasal spray Place 2 sprays into both nostrils daily.   Yes [provider]  tamsulosin (FLOMAX) 0.4 MG CAPS capsule TAKE 1 CAPSULE(0.4 MG) BY MOUTH DAILY AFTER SUPPER Patient taking differently: Take 0.4 mg by mouth at bedtime. 06/21/16  Yes [name], NP  Vitamin D, Cholecalciferol, 25 MCG (1000 UT) CAPS Take 1,000 Units by mouth daily.   Yes [provider]    Physical Exam: Vitals:   09/03/20 2145 09/03/20 2200 09/03/20 2353 09/04/20 0031  BP: (!) 182/78 (!) 178/57 (!) 178/73   Pulse: (!) 55 (!) 53 62   Resp: (!) '27 20 20   '$ Temp:    (!) 97.4 F (36.3 C)  TempSrc:    Oral  SpO2: 100% 97% 98%     Constitutional: NAD, calm, comfortable Eyes: PERRL, lids and conjunctivae normal ENMT: Mucous membranes are moist. Posterior pharynx clear of  any exudate or lesions.Normal dentition.  Neck: normal, supple, no masses, no thyromegaly Respiratory: clear to auscultation bilaterally, no wheezing, no crackles. Normal respiratory effort. No accessory muscle use.  Cardiovascular: Regular rate and rhythm, no murmurs / rubs / gallops. No extremity edema. 2+ pedal pulses. No carotid bruits.  Abdomen: no tenderness, no masses palpated. No hepatosplenomegaly. Bowel sounds positive.  Musculoskeletal: no clubbing / cyanosis. No joint deformity upper and lower extremities. Good ROM, no contractures. Normal muscle tone.  Skin: no rashes, lesions, ulcers. No induration Neurologic: Grossly non-focal, MAE Psychiatric: Demented, mental status c/w baseline   Labs on Admission:  I have personally reviewed following labs and imaging studies  CBC: Recent Labs  Lab 09/03/20 2027  WBC 5.0  HGB 7.8*  HCT 27.1*  MCV 113.9*  PLT [id number]   Basic Metabolic Panel: Recent Labs  Lab 09/03/20 2027 09/03/20 2308  NA 139  --   K 7.3* 5.9*  CL 116*  --   CO2 17*  --   GLUCOSE 101*  --   BUN 61*  --   CREATININE 4.06*  --   CALCIUM 8.4*  --    GFR: Estimated Creatinine Clearance: 12.7 mL/min (A) (by C-G formula based on SCr of 4.06 mg/dL (H)). Liver Function Tests: Recent Labs  Lab 09/03/20 2027  AST 25  ALT 21  ALKPHOS 86  BILITOT 0.6  PROT 7.2  ALBUMIN 3.3*   No results for input(s): LIPASE, AMYLASE in the last 168 hours. No results for input(s): AMMONIA in the last 168 hours. Coagulation Profile: No results for input(s): INR, PROTIME in the last 168 hours. Cardiac Enzymes: No results for input(s): CKTOTAL, CKMB, CKMBINDEX, TROPONINI in the last 168 hours. BNP (last 3 results) No results for input(s): PROBNP in the last 8760 hours. HbA1C: No results for input(s): HGBA1C in the last 72 hours. CBG: Recent Labs  Lab 09/03/20 2340  GLUCAP 47*   Lipid Profile: No results for input(s): CHOL, HDL, LDLCALC, TRIG, CHOLHDL, LDLDIRECT in the  last 72 hours. Thyroid Function Tests: No results for input(s): TSH, T4TOTAL, FREET4, T3FREE, THYROIDAB in the last 72 hours. Anemia Panel: No results for input(s): VITAMINB12, FOLATE, FERRITIN, TIBC, IRON, RETICCTPCT in the last 72 hours. Urine analysis:    Component Value Date/Time   COLORURINE YELLOW 06/20/2019 0630   APPEARANCEUR HAZY (A) 06/20/2019 0630   LABSPEC 1.013 06/20/2019 0630   PHURINE 5.0 06/20/2019 0630   GLUCOSEU NEGATIVE 06/20/2019 0630   HGBUR SMALL (A) 06/20/2019 0630   BILIRUBINUR NEGATIVE 06/20/2019 0630   KETONESUR NEGATIVE 06/20/2019 0630   PROTEINUR 100 (A) 06/20/2019 0630   UROBILINOGEN 0.2 04/02/2015 1430   NITRITE NEGATIVE 06/20/2019 0630   LEUKOCYTESUR NEGATIVE 06/20/2019 0630    Radiological Exams on Admission: CT Chest Wo Contrast  Result Date: 09/03/2020 CLINICAL DATA:  Pleural effusion EXAM: CT CHEST WITHOUT CONTRAST TECHNIQUE: Multidetector CT imaging of the chest was performed following the standard protocol without IV contrast. COMPARISON:  04/30/2009 FINDINGS: Cardiovascular: Mild coronary artery calcification. Global cardiac size is mildly enlarged with mild left ventricular dilation. Hypoattenuation of the cardiac blood pool is in keeping with at least mild anemia. No pericardial effusion. The central pulmonary arteries are enlarged in keeping with changes of pulmonary arterial hypertension. Moderate atherosclerotic calcification is seen within the thoracic aorta. No aortic aneurysm identified. Mediastinum/Nodes: The visualized thyroid is unremarkable. No pathologic thoracic adenopathy. The esophagus is unremarkable. Lungs/Pleura: Large left pleural effusion is present with complete collapse of the left lower lobe and compressive atelectasis of the lingula. Minimal ground-glass infiltrate and traction bronchiolectasis within the right apex likely represents mild fibrotic change. No additional superimposed focal pulmonary infiltrate. No pneumothorax. No  pleural effusion on the right. The central airways are widely patent. Upper Abdomen: No acute abnormality. Musculoskeletal: No acute bone abnormality. No lytic or blastic bone lesion. IMPRESSION: Large left pleural effusion with collapse of the left lower lobe. Mild coronary artery calcification. Mild cardiomegaly with left ventricular dilation. Morphologic changes in keeping with pulmonary arterial hypertension. Mild anemia Aortic Atherosclerosis (ICD10-I70.0). Electronically Signed   By: Fidela Salisbury MD   On: 09/03/2020  23:50   DG Chest Port 1 View  Result Date: 09/03/2020 CLINICAL DATA:  Altered mental status. EXAM: PORTABLE CHEST 1 VIEW COMPARISON:  June 19, 2019 FINDINGS: There is opacification of the left lung base and mid to lower left lung. A moderate size left pleural effusion is seen. No pneumothorax is identified. The cardiac silhouette is moderately enlarged. There is moderate severity calcification of the aortic arch. Degenerative changes seen throughout the thoracic spine. IMPRESSION: 1. Cardiomegaly with additional findings likely consistent with marked severity left basilar atelectasis and/or infiltrate. Follow-up to resolution is recommended, as an underlying neoplastic process cannot be excluded. 2. Moderate sized left-sided pleural effusion. Electronically Signed   By: Virgina Norfolk M.D.   On: 09/03/2020 20:42    EKG: Independently reviewed.  Assessment/Plan Principal Problem:   Acute kidney failure (HCC) Active Problems:   Essential hypertension   CKD (chronic kidney disease) stage 4, GFR 15-29 ml/min (HCC)   Dementia with behavioral disturbance (HCC)   Hyperkalemia   Pleural effusion on left    1. AKF on CKD 4-  1. ? Pre renal given recent reduced PO intake 2. US renal ordered 3. IVF: 1L bolus and NS at 125 4. Getting bladder scan 1. Does have large amount of UOP in the ED documented though (see RN note), so I am hopeful that this isnt obstructive. 2. Also has  good amount of output into condom cath on floor per floor RN 5. Call nephrology in AM 6. Discussed with daughter however: pt likely not a great candidate for dialysis. 2. Hyperkalemia - 1. Due to #1 above 2. Temp measures including calcium, insulin, glucose, kayexelate, NS IVF, etc all started in ED 3. Q2H potassium levels for now 4. Tele monitor 5. Looks like initial treatment has worked some for the moment: repeat Ks were improved to 5.9 and 5.7 (most recent). 3. Advanced dementia, wheelchair bound at baseline, now AKF on CKD 4 with hyperkalemia - 1. Think that its probably a good time to get pal care involved 2. Daughter confirms DNR status 3. Daughter understands and acknowledges that pt would likely be a poor candidate for dialysis, and definitely doesn't seem to be pushing for this. 4. Understands that if we cant get renal improvement / potassium under control, pt may only survive for as little as a few days. 4. L sided pleural effusion - 1. No SIRS, neg procalcitonin 2. Superimposed PNA felt unlikely 3. Will stop ABx for the moment 4. Does probably need to be drained for symptom relief if we can get the potassium and kidney under control. 5. Will put in for IR eval and possible drainage in AM. 5. HTN - 1. BP remains elevated including in ED 2. Cont all home BP meds 3. Doesn't appear to be on any nephrotoxic meds (ACEi, ARB, diuretics). 6. PAF - cont amiodarone  DVT prophylaxis: Heparin [place] Code Status: DNR Family Communication: Daughter at bedside, daughter agrees with medical treatment, agrees that pt likely wouldn't want / be a good candidate for dialysis at this stage. Disposition Plan: SNF after renal recovery and IR drainage vs hospice. Consults called: Put in for [place] care consult in AM, call nephrology in AM, call IR for pleural effusion drainage once kidney stabilized. Admission status: Admit to inpatient  Severity of Illness: The appropriate patient status for this  patient is INPATIENT. Inpatient status is judged to be reasonable and necessary in order to provide the required intensity of service to ensure the patient's safety. The  patient's presenting symptoms, physical exam findings, and initial radiographic and laboratory data in the context of their chronic comorbidities is felt to place them at high risk for further clinical deterioration. Furthermore, it is not anticipated that the patient will be medically stable for discharge from the hospital within 2 midnights of admission. The following factors support the patient status of inpatient.   IP status for AKF with creat of 4.x and K of 7.3 with EKG changes!   * I certify that at the point of admission it is my clinical judgment that the patient will require inpatient hospital care spanning beyond 2 midnights from the point of admission due to high intensity of service, high risk for further deterioration and high frequency of surveillance required.*    Arelie Kuzel M. DO [facility]  How to contact the [facility] Attending or Consulting provider [place] or covering provider during after hours [place], for this patient?  1. Check the care team in [facility] and look for a) attending/consulting [facility] provider listed and b) the [facility] team listed 2. Log into [email or web address]  Amion Physician Scheduling and messaging for groups and whole hospitals  On call and physician scheduling software for group practices, residents, hospitalists and other medical providers for call, clinic, rotation and shift schedules. OnCall [employer] is a hospital-wide system for scheduling doctors and paging doctors on call. EasyPlot is for scientific plotting and data analysis.  [email or web address]  and use Magazine's universal password to access. If you do not have the password, please contact the hospital operator.  3. Locate the [facility] provider you are looking for under [facility] and page to a number that you can be directly  reached. 4. If you still have difficulty reaching the provider, please page the [facility] (Director on Call) for the Hospitalists listed on amion for assistance.  09/04/2020, 12:45 AM

## 2020-09-04 NOTE — ED Notes (Signed)
Pt voided a large amount of urine in the [facility] along with a BM.

## 2020-09-04 NOTE — Progress Notes (Signed)
Potassium 6.3. [place]aw[place], MD paged.

## 2020-09-04 NOTE — Procedures (Addendum)
Ultrasound-guided diagnostic and therapeutic left sided thoracentesis performed yielding 1.5 liters of amber colored fluid. No immediate complications.   Diagnostic fluid was sent to the lab for further analysis. Follow-up chest x-ray pending. EBL is < 2 ml. aptient unable to tolerate additional fluid removal at this time.

## 2020-09-04 NOTE — ED Notes (Signed)
Attempted report to 2W x1

## 2020-09-04 NOTE — Progress Notes (Signed)
PROGRESS NOTE    Wayne Fuller  [id number] DOB: 1923-09-13 DOA: 09/03/2020 PCP: [name], NP   Brief Narrative:  HPI: Wayne Fuller is a 87 y.o. male with medical history significant of advanced dementia, CKD 4, HTN, PAF not on anticoagulation.  Patient presents to the ED with c/o SOB.  This has been ongoing for past few days.  Worsening, moderate, constant.  Associated generalized weakness.  Also poor PO intake over past week or so.  Had 2 days of N/V/D due to viral illness that was going around the SNF between 1-2 weeks ago according to daughter who is at bedside providing history.  No reported fevers, fall/trauma.  No h/o asthma nor COPD.   ED Course: COVID neg.  Pt with AKF with creat of 4.0 up from 2.6 in Jan 2021, BUN 61, K is 7.3!  Pt does have tall peaked T waves on EKG today.  Hgb 7.8 (Was 7.6 in Oct 2021).  Initially concern for LLL PNA, pt put on rocephin + azithro.  Procalcitonin neg, and CT chest just reveals large L pleural effusion.  Pt given temp measures for hyperkalemia, hospitalist asked to admit.  Assessment & Plan:   Principal Problem:   Acute kidney failure ([place]) Active Problems:   Essential hypertension   CKD (chronic kidney disease) stage 4, GFR 15-29 ml/min (HCC)   Dementia with behavioral disturbance (HCC)   Hyperkalemia   Pleural effusion on left  Acute hypoxic respiratory failure due to large left-sided pleural effusion and possible left lower lobe community-acquired pneumonia: Due to large pleural effusion which is also causing collapse of the left lower lobe, it is challenging to rule out pneumonia with confidence.  At this point in time I would continue antibiotics which will be Rocephin and Zithromax empirically to cover pneumonia.  He is now status post left thoracentesis with removal of 1.5 L of fluid.  Labs are pending.  Try to wean oxygen.  AKI on CKD stage IV: Baseline creatinine around 2.5.  Presented with 4.06.   Improved to 3.79.  Unable to provide him IV fluids due to left large pleural effusion.  Hope that renal function now improved now that he had thoracentesis.  Hyperkalemia: Patient presented with potassium of 7.3.  He received insulin, glucose, Kayexalate and calcium.  It is now down to 6.3.  We will give him D50 1 amp along with 10 units of regular insulin as well as Kayexalate and repeat labs in the morning.  Advanced dementia/Wheelchair-bound at baseline: Patient has pretty advanced dementia.  Continue quetiapine to prevent delirium.  Essential hypertension: Blood pressure still elevated.  Continue home medications which include amlodipine 10 mg, hydralazine 100 mg p.o. 3 times daily, Toprol-XL 25 mg daily.  We will add labetalol as needed.  Paroxysmal atrial fibrillation: Rate fairly controlled.  Continue amiodarone, Toprol-XL and baby aspirin.  DVT prophylaxis: heparin injection 5,000 Units Start: 09/03/20 2315   Code Status: DNR  Family Communication:  None present at bedside.   Status is: Inpatient  Remains inpatient appropriate because:Inpatient level of care appropriate due to severity of illness   Dispo: The patient is from: SNF              Anticipated d/c is to: SNF              Patient currently is not medically stable to d/c.   Difficult to place patient No        Estimated body mass index is 22.92 kg/m  as calculated from the following:   Height as of this encounter: 6' 2.02" (1.88 m).   Weight as of this encounter: 81 kg.      Nutritional status:               Consultants:   IR  Procedures:   Left thoracentesis  Antimicrobials:  Anti-infectives (From admission, onward)   Start     Dose/Rate Route Frequency Ordered Stop   09/04/20 2200  cefTRIAXone (ROCEPHIN) 2 g in sodium chloride 0.9 % 100 mL IVPB        2 g 200 mL/hr over 30 Minutes Intravenous Every 24 hours 09/03/20 2312 09/09/20 2159   09/04/20 2200  azithromycin (ZITHROMAX) 500 mg in  sodium chloride 0.9 % 250 mL IVPB        500 mg 250 mL/hr over 60 Minutes Intravenous Every 24 hours 09/03/20 2312 09/09/20 2159   09/03/20 2230  cefTRIAXone (ROCEPHIN) 1 g in sodium chloride 0.9 % 100 mL IVPB        1 g 200 mL/hr over 30 Minutes Intravenous  Once 09/03/20 2229 09/03/20 2332   09/03/20 2230  azithromycin (ZITHROMAX) 500 mg in sodium chloride 0.9 % 250 mL IVPB        500 mg 250 mL/hr over 60 Minutes Intravenous  Once 09/03/20 2229 09/04/20 0148         Subjective: Patient seen and examined.  Patient is alert but completely disoriented however he is pleasant.  Looks comfortable.  Objective: Vitals:   09/04/20 0754 09/04/20 1100 09/04/20 1154 09/04/20 1228  BP: (!) 179/62 (!) 176/42 (!) 176/42 (!) 181/98  Pulse: (!) 58   66  Resp: 20   18  Temp: 97.8 F (36.6 C)   98.6 F (37 C)  TempSrc: Oral   Oral  SpO2: 94%   98%  Weight:      Height:        Intake/Output Summary (Last 24 hours) at 09/04/2020 1632 Last data filed at 09/04/2020 1155 Gross per 24 hour  Intake 250 ml  Output 1500 ml  Net -1250 ml   Filed Weights   09/04/20 0315  Weight: 81 kg    Examination:  General exam: Appears calm and comfortable, pleasant but confused Respiratory system: Diminished breath sounds at the left middle and lower lobe. Respiratory effort normal. Cardiovascular system: S1 & S2 heard, RRR. No JVD, murmurs, rubs, gallops or clicks. No pedal edema. Gastrointestinal system: Abdomen is nondistended, soft and nontender. No organomegaly or masses felt. Normal bowel sounds heard. Central nervous system: Alert but not oriented.  No focal deficit. Extremities: Symmetric 5 x 5 power.    Data Reviewed: I have personally reviewed following labs and imaging studies  CBC: Recent Labs  Lab 09/03/20 2027 09/04/20 1249  WBC 5.0 3.8*  HGB 7.8* 7.3*  HCT 27.1* 25.2*  MCV 113.9* 113.0*  PLT 264 [id number]   Basic Metabolic Panel: Recent Labs  Lab 09/03/20 2027 09/03/20 2308  09/03/20 2359 09/04/20 1249  NA 139  --   --  141  K 7.3* 5.9* 5.7* 6.3*  CL 116*  --   --  117*  CO2 17*  --   --  19*  GLUCOSE 101*  --   --  84  BUN 61*  --   --  53*  CREATININE 4.06*  --   --  3.79*  CALCIUM 8.4*  --   --  8.1*   GFR: Estimated Creatinine Clearance: 13.7 mL/min (  A) (by C-G formula based on SCr of 3.79 mg/dL (H)). Liver Function Tests: Recent Labs  Lab 09/03/20 2027 09/04/20 1249  AST 25  --   ALT 21  --   ALKPHOS 86  --   BILITOT 0.6  --   PROT 7.2 6.2*  ALBUMIN 3.3* 2.7*   No results for input(s): LIPASE, AMYLASE in the last 168 hours. No results for input(s): AMMONIA in the last 168 hours. Coagulation Profile: No results for input(s): INR, PROTIME in the last 168 hours. Cardiac Enzymes: No results for input(s): CKTOTAL, CKMB, CKMBINDEX, TROPONINI in the last 168 hours. BNP (last 3 results) No results for input(s): PROBNP in the last 8760 hours. HbA1C: No results for input(s): HGBA1C in the last 72 hours. CBG: Recent Labs  Lab 09/03/20 2340  GLUCAP 47*   Lipid Profile: No results for input(s): CHOL, HDL, LDLCALC, TRIG, CHOLHDL, LDLDIRECT in the last 72 hours. Thyroid Function Tests: No results for input(s): TSH, T4TOTAL, FREET4, T3FREE, THYROIDAB in the last 72 hours. Anemia Panel: No results for input(s): VITAMINB12, FOLATE, FERRITIN, TIBC, IRON, RETICCTPCT in the last 72 hours. Sepsis Labs: Recent Labs  Lab 09/03/20 2230 09/03/20 2308  PROCALCITON  --  <0.10  LATICACIDVEN 1.9  --     Recent Results (from the past 240 hour(s))  Resp Panel by RT-PCR (Flu A&B, Covid) Nasopharyngeal Swab     Status: None   Collection Time: 09/03/20  8:28 PM   Specimen: Nasopharyngeal Swab; Nasopharyngeal(NP) swabs in vial transport medium  Result Value Ref Range Status   SARS Coronavirus 2 by RT PCR NEGATIVE NEGATIVE Final    Comment: (NOTE) SARS-CoV-2 target nucleic acids are NOT DETECTED.  The SARS-CoV-2 RNA is generally detectable in upper  respiratory specimens during the acute phase of infection. The lowest concentration of SARS-CoV-2 viral copies this assay can detect is 138 copies/mL. A negative result does not preclude SARS-Cov-2 infection and should not be used as the sole basis for treatment or other patient management decisions. A negative result may occur with  improper specimen collection/handling, submission of specimen other than nasopharyngeal swab, presence of viral mutation(s) within the areas targeted by this assay, and inadequate number of viral copies(<138 copies/mL). A negative result must be combined with clinical observations, patient history, and epidemiological information. The expected result is Negative.  Fact Sheet for Patients:  EntrepreneurPulse.com.au  Fact Sheet for Healthcare Providers:  IncredibleEmployment.be  This test is no t yet approved or cleared by the [place] FDA and  has been authorized for detection and/or diagnosis of SARS-CoV-2 by FDA under an Emergency Use Authorization (EUA). This EUA will remain  in effect (meaning this test can be used) for the duration of the COVID-19 declaration under Section 564(b)(1) of the Act, 21 U.S.C.section 360bbb-3(b)(1), unless the authorization is terminated  or revoked sooner.       Influenza A by PCR NEGATIVE NEGATIVE Final   Influenza B by PCR NEGATIVE NEGATIVE Final    Comment: (NOTE) The Xpert Xpress SARS-CoV-2/FLU/RSV plus assay is intended as an aid in the diagnosis of influenza from Nasopharyngeal swab specimens and should not be used as a sole basis for treatment. Nasal washings and aspirates are unacceptable for Xpert Xpress SARS-CoV-2/FLU/RSV testing.  Fact Sheet for Patients: EntrepreneurPulse.com.au  Fact Sheet for Healthcare Providers: IncredibleEmployment.be  This test is not yet approved or cleared by the [place] FDA and has been  authorized for detection and/or diagnosis of SARS-CoV-2 by FDA under an Emergency Use Authorization (  EUA). This EUA will remain in effect (meaning this test can be used) for the duration of the COVID-19 declaration under Section 564(b)(1) of the Act, 21 U.S.C. section 360bbb-3(b)(1), unless the authorization is terminated or revoked.  Performed at [facility], [place] [address], [place], [place] [address]   Blood culture (routine x 2)     Status: None (Preliminary result)   Collection Time: 09/03/20 11:11 PM   Specimen: BLOOD RIGHT FOREARM  Result Value Ref Range Status   Specimen Description BLOOD RIGHT FOREARM  Final   Special Requests   Final    BOTTLES DRAWN AEROBIC AND ANAEROBIC Blood Culture adequate volume   Culture   Final    NO GROWTH < 12 HOURS Performed at [facility], [place] [address], [place], [place] [address]    Report Status PENDING  Incomplete      Radiology Studies: DG Chest 1 View  Result Date: 09/04/2020 CLINICAL DATA:  87 year old male with a history of thoracentesis EXAM: CHEST  1 VIEW COMPARISON:  09/03/2020 FINDINGS: Cardiomediastinal silhouette unchanged with cardiomegaly. Similar appearance of mild central vascular congestion. Improved opacity at the left lung base, right better visualization of the left heart and improved aeration at the base. No pneumothorax. No new confluent airspace disease. Surgical changes of the cervical region incompletely imaged IMPRESSION: Negative for complicating features status post left thoracentesis. Electronically Signed   By: Corrie Mckusick D.O.   On: 09/04/2020 12:31   CT Chest Wo Contrast  Result Date: 09/03/2020 CLINICAL DATA:  Pleural effusion EXAM: CT CHEST WITHOUT CONTRAST TECHNIQUE: Multidetector CT imaging of the chest was performed following the standard protocol without IV contrast. COMPARISON:  04/30/2009 FINDINGS: Cardiovascular: Mild coronary artery calcification. Global cardiac size is mildly enlarged  with mild left ventricular dilation. Hypoattenuation of the cardiac blood pool is in keeping with at least mild anemia. No pericardial effusion. The central pulmonary arteries are enlarged in keeping with changes of pulmonary arterial hypertension. Moderate atherosclerotic calcification is seen within the thoracic aorta. No aortic aneurysm identified. Mediastinum/Nodes: The visualized thyroid is unremarkable. No pathologic thoracic adenopathy. The esophagus is unremarkable. Lungs/Pleura: Large left pleural effusion is present with complete collapse of the left lower lobe and compressive atelectasis of the lingula. Minimal ground-glass infiltrate and traction bronchiolectasis within the right apex likely represents mild fibrotic change. No additional superimposed focal pulmonary infiltrate. No pneumothorax. No pleural effusion on the right. The central airways are widely patent. Upper Abdomen: No acute abnormality. Musculoskeletal: No acute bone abnormality. No lytic or blastic bone lesion. IMPRESSION: Large left pleural effusion with collapse of the left lower lobe. Mild coronary artery calcification. Mild cardiomegaly with left ventricular dilation. Morphologic changes in keeping with pulmonary arterial hypertension. Mild anemia Aortic Atherosclerosis (ICD10-I70.0). Electronically Signed   By: Fidela Salisbury MD   On: 09/03/2020 23:50   US RENAL  Result Date: 09/04/2020 CLINICAL DATA:  Acute kidney failure, history dementia, hypertension, former smoker EXAM: ULTRASOUND RENAL LIMITED COMPARISON:  01/30/2016 FINDINGS: Right Kidney: Renal measurements: 7.8 x 4.0 x 6.5 cm = volume: 109 mL. Cortical thinning. Increased cortical echogenicity. Small cyst at upper pole 16 x 17 x 18 mm. No additional mass or hydronephrosis. No shadowing calcification. Left Kidney: Patient uncooperative/combative, unable to image LEFT kidney. Procedure terminated. IMPRESSION: Medical renal disease changes of RIGHT kidney with small cyst  at upper pole. Premature termination of exam due to patient noncompliance, LEFT kidney not imaged. Electronically Signed   By: Crist Infante.D.  On: 09/04/2020 14:31   DG Chest Port 1 View  Result Date: 09/03/2020 CLINICAL DATA:  Altered mental status. EXAM: PORTABLE CHEST 1 VIEW COMPARISON:  June 19, 2019 FINDINGS: There is opacification of the left lung base and mid to lower left lung. A moderate size left pleural effusion is seen. No pneumothorax is identified. The cardiac silhouette is moderately enlarged. There is moderate severity calcification of the aortic arch. Degenerative changes seen throughout the thoracic spine. IMPRESSION: 1. Cardiomegaly with additional findings likely consistent with marked severity left basilar atelectasis and/or infiltrate. Follow-up to resolution is recommended, as an underlying neoplastic process cannot be excluded. 2. Moderate sized left-sided pleural effusion. Electronically Signed   By: Virgina Norfolk M.D.   On: 09/03/2020 20:42   IR THORACENTESIS ASP PLEURAL SPACE W/IMG GUIDE  Result Date: 09/04/2020 INDICATION: Patient with history of dementia and chronic kidney disease. Admitted to the hospital with shortness of breath. Found to have left-sided pleural effusion. Request is for therapeutic and diagnostic left-sided thoracentesis. EXAM: ULTRASOUND GUIDED THERAPEUTIC AND DIAGNOSTIC LEFT-SIDED THORACENTESIS MEDICATIONS: Lidocaine 1% 10 mL COMPLICATIONS: None immediate. PROCEDURE: An ultrasound guided thoracentesis was thoroughly discussed with the patient and questions answered. The benefits, risks, alternatives and complications were also discussed. The patient understands and wishes to proceed with the procedure. Written consent was obtained. Ultrasound was performed to localize and mark an adequate pocket of fluid in the left chest. The area was then prepped and draped in the normal sterile fashion. 1% Lidocaine was used for local anesthesia. Under ultrasound  guidance a 6 Fr Safe-T-Centesis catheter was introduced. Thoracentesis was performed. The catheter was removed and a dressing applied. FINDINGS: A total of approximately 1.5 L of amber colored fluid was removed. Samples were sent to the laboratory as requested by the clinical team. IMPRESSION: Successful ultrasound guided therapeutic and diagnostic left-sided thoracentesis thoracentesis yielding 1.5 L of pleural fluid. Read by: Rushie Nyhan, NP Electronically Signed   By: Jacqulynn Cadet M.D.   On: 09/04/2020 12:50    Scheduled Meds: . albuterol  4 puff Inhalation Once  . amiodarone  200 mg Oral Daily  . amLODipine  10 mg Oral Daily  . aspirin EC  81 mg Oral Daily  . cholecalciferol  1,000 Units Oral Daily  . ferrous sulfate  325 mg Oral Daily  . heparin  5,000 Units Subcutaneous Q8H  . hydrALAZINE  100 mg Oral TID  . ipratropium  2 puff Inhalation Once  . lidocaine      . melatonin  3 mg Oral QHS  . memantine  5 mg Oral Daily  . metoprolol succinate  25 mg Oral Daily  . polyethylene glycol  17 g Oral Daily  . QUEtiapine  12.5 mg Oral QHS  . sodium bicarbonate  650 mg Oral BID  . sodium chloride  2 spray Each Nare Daily  . tamsulosin  0.4 mg Oral QHS   Continuous Infusions: . sodium chloride 100 mL/hr at 09/03/20 2300  . azithromycin    . cefTRIAXone (ROCEPHIN)  IV       LOS: 1 day   Time spent: 37 minutes   Darliss Cheney, MD [facility]  09/04/2020, 4:32 PM   To contact the attending provider between 7A-7P or the covering provider during after hours 7P-7A, please log into the web site [email or web address].

## 2020-09-04 NOTE — ED Notes (Signed)
MD Alcario Drought made aware of pts CBG. Pt given orange juice. D50 ordered.

## 2020-09-04 NOTE — Evaluation (Signed)
Clinical/Bedside Swallow Evaluation Patient Details  Name: Wayne Fuller MRN: [id number] Date of Birth: 1923-08-16  Today's Date: 09/04/2020 Time: SLP Start Time (ACUTE ONLY): [id number] SLP Stop Time (ACUTE ONLY): 1326 SLP Time Calculation (min) (ACUTE ONLY): 13.05 min  Past Medical History:  Past Medical History:  Diagnosis Date  . BPH (benign prostatic hyperplasia)   . Chronic kidney disease    ckd stage 3  . Dementia ([place])   . GERD (gastroesophageal reflux disease)   . Hemorrhoids   . History of angina   . Hyperlipemia   . Hypertension   . Irregular heart rhythm   . Paroxysmal A-fib ([place]) 04/02/2015   Past Surgical History:  Past Surgical History:  Procedure Laterality Date  . BACK SURGERY    . CERVICAL SPINE SURGERY    . IR THORACENTESIS ASP PLEURAL SPACE W/IMG GUIDE  09/04/2020  . SPINE SURGERY     HPI:  Pt is a 87 y.o. male with medical history significant of advanced dementia, CKD 4, HTN, PAF not on anticoagulation. Pt presented to the ED with c/o SOB and and poor oral intake over the past week. CT chest 4/14: Large left pleural effusion with collapse of the left lower lobe. [name] in December, 2019; pt demonstrated impulsivity and a dysphagia 2 diet with thin liquids was recommended at this time.   Assessment / Plan / Recommendation Clinical Impression  Pt was seen for bedside swallow evaluation. Pt was unable to provide any history, but pt's son at bedside reported that the pt was consuming regular texture solids and thin liquids without overt s/sx of aspiration. Pt's son reported that the pt is confused at baseline and that utterances are often meaningless, but he stated that the pt may be slightly more confused at this time. Pt was unable to participate in an oral mechanism exam due to his inability to follow commands. Pt did not allow adequate oral inspection, but only two mandibular teeth were visualized. Pt demonstrated impulsive tendencies and cups and straws  needed to be withdrawn to reduce intake rate. Despite this, pt demonstrated coughing with thin liquids via cup/straw. Mastication of regular textures was rapid and its thoroughness is questioned. A dysphagia 2 diet with nectar thick liquids will be initiated at this time. SLP will follow to assess diet tolerance and for diet advancement as clinically indicated. SLP Visit Diagnosis: Dysphagia, unspecified (R13.10)    Aspiration Risk  Moderate aspiration risk    Diet Recommendation Dysphagia 2 (Fine chop);Nectar-thick liquid   Liquid Administration via: Cup;Straw Medication Administration: Whole meds with puree Supervision: Full supervision/cueing for compensatory strategies;Staff to assist with self feeding Compensations: Slow rate;Small sips/bites;Minimize environmental distractions Postural Changes: Seated upright at 90 degrees    Other  Recommendations Oral Care Recommendations: Oral care BID   Follow up Recommendations  (TBD)      Frequency and Duration min 2x/week  2 weeks       Prognosis Prognosis for Safe Diet Advancement: Fair Barriers to Reach Goals: Cognitive deficits;Severity of deficits      Swallow Study   General Date of Onset: 09/03/20 HPI: Pt is a 87 y.o. male with medical history significant of advanced dementia, CKD 4, HTN, PAF not on anticoagulation. Pt presented to the ED with c/o SOB and and poor oral intake over the past week. CT chest 4/14: Large left pleural effusion with collapse of the left lower lobe. [name] in December, 2019; pt demonstrated impulsivity and a dysphagia  2 diet with thin liquids was recommended at this time. Type of Study: Bedside Swallow Evaluation Previous Swallow Assessment: See HPI Diet Prior to this Study:  (no diet ordered) Temperature Spikes Noted: No Respiratory Status: Nasal cannula History of Recent Intubation: No Behavior/Cognition: Alert;Cooperative;Confused Oral Cavity Assessment: Within Functional Limits Oral  Care Completed by SLP: No Oral Cavity - Dentition: Missing dentition;Poor condition Vision: Functional for self-feeding Self-Feeding Abilities: Needs assist Patient Positioning: Upright in bed;Postural control adequate for testing Baseline Vocal Quality: Normal Volitional Cough: Weak Volitional Swallow: Able to elicit    Oral/Motor/Sensory Function Overall Oral Motor/Sensory Function:  (pty unable to participate)   Ice Chips Ice chips: Within functional limits Presentation: Spoon   Thin Liquid Thin Liquid: Impaired Presentation: Cup;Straw Pharyngeal  Phase Impairments: Cough - Immediate;Throat Clearing - Immediate    Nectar Thick Nectar Thick Liquid: Within functional limits Presentation: Straw   Honey Thick Honey Thick Liquid: Not tested   Puree Puree: Within functional limits Presentation: Spoon   Solid     Solid: Within functional limits Presentation: [place] [address] [address], [place], [place] Office number [phone] Pager [phone]  Horton Marshall 09/04/2020,2:53 PM

## 2020-09-04 NOTE — ED Notes (Signed)
[facility] given an update on pt.

## 2020-09-05 DIAGNOSIS — N179 Acute kidney failure, unspecified: Secondary | ICD-10-CM | POA: Diagnosis not present

## 2020-09-05 LAB — CBC WITH DIFFERENTIAL/PLATELET
Abs Immature Granulocytes: 0.02 10*3/uL (ref 0.00–0.07)
Basophils Absolute: 0 10*3/uL (ref 0.0–0.1)
Basophils Relative: 0 %
Eosinophils Absolute: 0 10*3/uL (ref 0.0–0.5)
Eosinophils Relative: 1 %
HCT: 21.2 % — ABNORMAL LOW (ref 39.0–52.0)
Hemoglobin: 6.1 g/dL — CL (ref 13.0–17.0)
Immature Granulocytes: 1 %
Lymphocytes Relative: 13 %
Lymphs Abs: 0.5 10*3/uL — ABNORMAL LOW (ref 0.7–4.0)
MCH: 31.9 pg (ref 26.0–34.0)
MCHC: 28.8 g/dL — ABNORMAL LOW (ref 30.0–36.0)
MCV: 111 fL — ABNORMAL HIGH (ref 80.0–100.0)
Monocytes Absolute: 0.3 10*3/uL (ref 0.1–1.0)
Monocytes Relative: 9 %
Neutro Abs: 2.7 10*3/uL (ref 1.7–7.7)
Neutrophils Relative %: 76 %
Platelets: 192 10*3/uL (ref 150–400)
RBC: 1.91 MIL/uL — ABNORMAL LOW (ref 4.22–5.81)
RDW: 15.6 % — ABNORMAL HIGH (ref 11.5–15.5)
WBC: 3.5 10*3/uL — ABNORMAL LOW (ref 4.0–10.5)
nRBC: 0 % (ref 0.0–0.2)

## 2020-09-05 LAB — COMPREHENSIVE METABOLIC PANEL
ALT: 15 U/L (ref 0–44)
AST: 20 U/L (ref 15–41)
Albumin: 2.2 g/dL — ABNORMAL LOW (ref 3.5–5.0)
Alkaline Phosphatase: 61 U/L (ref 38–126)
Anion gap: 7 (ref 5–15)
BUN: 51 mg/dL — ABNORMAL HIGH (ref 8–23)
CO2: 19 mmol/L — ABNORMAL LOW (ref 22–32)
Calcium: 7.8 mg/dL — ABNORMAL LOW (ref 8.9–10.3)
Chloride: 117 mmol/L — ABNORMAL HIGH (ref 98–111)
Creatinine, Ser: 3.75 mg/dL — ABNORMAL HIGH (ref 0.61–1.24)
GFR, Estimated: 14 mL/min — ABNORMAL LOW (ref >60)
Glucose, Bld: 88 mg/dL (ref 70–99)
Potassium: 5.9 mmol/L — ABNORMAL HIGH (ref 3.5–5.1)
Sodium: 143 mmol/L (ref 135–145)
Total Bilirubin: 0.2 mg/dL — ABNORMAL LOW (ref 0.3–1.2)
Total Protein: 5.1 g/dL — ABNORMAL LOW (ref 6.5–8.1)

## 2020-09-05 LAB — GLUCOSE, CAPILLARY: Glucose-Capillary: 102 mg/dL — ABNORMAL HIGH (ref 70–99)

## 2020-09-05 MED ORDER — DEXTROSE 50 % IV SOLN: 1.0000 | Freq: Once | INTRAVENOUS | Status: DC

## 2020-09-05 MED ORDER — SODIUM POLYSTYRENE SULFONATE 15 GM/60ML PO SUSP
30.0000 g | Freq: Once | ORAL | Status: AC
  Administered 2020-09-05: 30 g via ORAL
  Filled 2020-09-05: qty 120

## 2020-09-05 MED ORDER — SODIUM CHLORIDE 0.9% IV SOLUTION: Freq: Once | INTRAVENOUS | Status: AC

## 2020-09-05 MED ORDER — INSULIN ASPART 100 UNIT/ML IV SOLN: 10.0000 [IU] | Freq: Once | INTRAVENOUS | Status: DC

## 2020-09-05 NOTE — Progress Notes (Signed)
PROGRESS NOTE    Wayne Fuller  [id number] DOB: 1924-01-25 DOA: 09/03/2020 PCP: [name], NP   Brief Narrative:  Wayne Fuller is a 87 y.o. male with medical history significant of advanced dementia, CKD 4, HTN, PAF not on anticoagulation.  Patient presents to the ED with c/o SOB.  This has been ongoing for past few days.  Worsening, moderate, constant.  Associated generalized weakness.  Also poor PO intake over past week or so.  Had 2 days of N/V/D due to viral illness that was going around the SNF between 1-2 weeks ago according to daughter who is at bedside providing history.  No reported fevers, fall/trauma.  No h/o asthma nor COPD.   ED Course: COVID neg.  Pt with AKF with creat of 4.0 up from 2.6 in Jan 2021, BUN 61, K is 7.3!  Pt does have tall peaked T waves on EKG today.  Hgb 7.8 (Was 7.6 in Oct 2021).  Initially concern for LLL PNA, pt put on rocephin + azithro.  Procalcitonin neg, and CT chest just reveals large L pleural effusion.  Pt given temp measures for hyperkalemia, hospitalist asked to admit.  Assessment & Plan:   Principal Problem:   Acute kidney failure ([place]) Active Problems:   Essential hypertension   CKD (chronic kidney disease) stage 4, GFR 15-29 ml/min (HCC)   Dementia with behavioral disturbance (HCC)   Hyperkalemia   Pleural effusion on left  Acute hypoxic respiratory failure due to large left-sided pleural effusion and possible left lower lobe community-acquired pneumonia: Status post diagnostic and therapeutic thoracentesis 09/04/2020 with a yield of 1500 cc fluid.  Fluid analysis indicate transudative fluid.  Patient's respiratory status has improved.  He is currently on 3 L and saturating 100%.  I have advised the patient's primary RN to wean oxygen down.  Continue current antibiotics for pneumonia.  AKI on CKD stage IV: Baseline creatinine around 2.5.  Presented with 4.06.  Improved to 3.  7.  Unable to give him IV  fluids due to pleural effusion.  We will see how he does tomorrow.  We will check his labs tomorrow morning.  Hyperkalemia: Patient presented with potassium of 7.3.  He received insulin, glucose, Kayexalate and calcium.  It is now down to 5.9.  We will repeat 1 amp of D50 with regular insulin 10 units and Kayexalate.  Monitor on telemetry.  Recheck labs in the morning.  Acute on chronic anemia: He has anemia of chronic disease due to CKD.  His hemoglobin dropped to 6.1 this morning.  Nocturnist ordered 1 unit of PRBC transfusion which is completed.  Waiting for repeat H&H.  Advanced dementia/Wheelchair-bound at baseline: Patient has pretty advanced dementia.  Continue quetiapine to prevent delirium.  Essential hypertension: Blood pressure was elevated very early in the morning but then was normal.  Continue home medications which include amlodipine 10 mg, hydralazine 100 mg p.o. 3 times daily, Toprol-XL 25 mg daily.  Continue labetalol as needed.  Paroxysmal atrial fibrillation: Rate fairly controlled.  Continue amiodarone, Toprol-XL and baby aspirin.  DVT prophylaxis: heparin injection 5,000 Units Start: 09/03/20 2315   Code Status: DNR  Family Communication:  None present at bedside.  Discussed with daughter over the phone.  Status is: Inpatient  Remains inpatient appropriate because:Inpatient level of care appropriate due to severity of illness   Dispo: The patient is from: SNF              Anticipated d/c is to: SNF  Patient currently is not medically stable to d/c.   Difficult to place patient No        Estimated body mass index is 22.92 kg/m as calculated from the following:   Height as of this encounter: 6' 2.02" (1.88 m).   Weight as of this encounter: 81 kg.      Nutritional status:               Consultants:   IR  Procedures:   Left thoracentesis  Antimicrobials:  Anti-infectives (From admission, onward)   Start     Dose/Rate Route  Frequency Ordered Stop   09/04/20 2200  cefTRIAXone (ROCEPHIN) 2 g in sodium chloride 0.9 % 100 mL IVPB        2 g 200 mL/hr over 30 Minutes Intravenous Every 24 hours 09/03/20 2312 09/09/20 2159   09/04/20 2200  azithromycin (ZITHROMAX) 500 mg in sodium chloride 0.9 % 250 mL IVPB        500 mg 250 mL/hr over 60 Minutes Intravenous Every 24 hours 09/03/20 2312 09/09/20 0559   09/04/20 1730  cefTRIAXone (ROCEPHIN) 1 g in sodium chloride 0.9 % 100 mL IVPB  Status:  Discontinued        1 g 200 mL/hr over 30 Minutes Intravenous Every 24 hours 09/04/20 1634 09/04/20 1643   09/04/20 1730  azithromycin (ZITHROMAX) 500 mg in sodium chloride 0.9 % 250 mL IVPB  Status:  Discontinued        500 mg 250 mL/hr over 60 Minutes Intravenous Every 24 hours 09/04/20 1634 09/04/20 1644   09/03/20 2230  cefTRIAXone (ROCEPHIN) 1 g in sodium chloride 0.9 % 100 mL IVPB        1 g 200 mL/hr over 30 Minutes Intravenous  Once 09/03/20 2229 09/03/20 2332   09/03/20 2230  azithromycin (ZITHROMAX) 500 mg in sodium chloride 0.9 % 250 mL IVPB        500 mg 250 mL/hr over 60 Minutes Intravenous  Once 09/03/20 2229 09/04/20 0148         Subjective: Patient seen and examined.  Lethargic and confused.  Likely at his baseline due to advanced dementia.  Looked comfortable.  Objective: Vitals:   09/05/20 0500 09/05/20 0530 09/05/20 0545 09/05/20 0945  BP: (!) 192/89 (!) 198/89 (!) 120/43 (!) (P) 156/49  Pulse: 67 67    Resp: '20 20 18 '$ (P) 16  Temp: 98.3 F (36.8 C) 98.8 F (37.1 C) 98.2 F (36.8 C) (P) 98 F (36.7 C)  TempSrc: Axillary Axillary Axillary (P) Axillary  SpO2: 100% 100% 100%   Weight:      Height:        Intake/Output Summary (Last 24 hours) at 09/05/2020 1333 Last data filed at 09/05/2020 0930 Gross per 24 hour  Intake 1403.78 ml  Output --  Net 1403.78 ml   Filed Weights   09/04/20 0315  Weight: 81 kg    Examination:  General exam: Appears calm and comfortable  Respiratory system:  Bibasilar crackles and rhonchi. Respiratory effort normal. Cardiovascular system: S1 & S2 heard, RRR. No JVD, murmurs, rubs, gallops or clicks. No pedal edema. Gastrointestinal system: Abdomen is nondistended, soft and nontender. No organomegaly or masses felt. Normal bowel sounds heard. Central nervous system: Slightly lethargic and not oriented.  Moving all extremities spontaneously. Extremities: Symmetric 5 x 5 power.   Data Reviewed: I have personally reviewed following labs and imaging studies  CBC: Recent Labs  Lab 09/03/20 2027 09/04/20 1249 09/05/20 0118  WBC 5.0 3.8* 3.5*  NEUTROABS  --   --  2.7  HGB 7.8* 7.3* 6.1*  HCT 27.1* 25.2* 21.2*  MCV 113.9* 113.0* 111.0*  PLT 264 225 [id number]   Basic Metabolic Panel: Recent Labs  Lab 09/03/20 2027 09/03/20 2308 09/03/20 2359 09/04/20 1249 09/05/20 0118  NA 139  --   --  141 143  K 7.3* 5.9* 5.7* 6.3* 5.9*  CL 116*  --   --  117* 117*  CO2 17*  --   --  19* 19*  GLUCOSE 101*  --   --  84 88  BUN 61*  --   --  53* 51*  CREATININE 4.06*  --   --  3.79* 3.75*  CALCIUM 8.4*  --   --  8.1* 7.8*   GFR: Estimated Creatinine Clearance: 13.8 mL/min (A) (by C-G formula based on SCr of 3.75 mg/dL (H)). Liver Function Tests: Recent Labs  Lab 09/03/20 2027 09/04/20 1249 09/05/20 0118  AST 25  --  20  ALT 21  --  15  ALKPHOS 86  --  61  BILITOT 0.6  --  0.2*  PROT 7.2 6.2* 5.1*  ALBUMIN 3.3* 2.7* 2.2*   No results for input(s): LIPASE, AMYLASE in the last 168 hours. No results for input(s): AMMONIA in the last 168 hours. Coagulation Profile: No results for input(s): INR, PROTIME in the last 168 hours. Cardiac Enzymes: No results for input(s): CKTOTAL, CKMB, CKMBINDEX, TROPONINI in the last 168 hours. BNP (last 3 results) No results for input(s): PROBNP in the last 8760 hours. HbA1C: No results for input(s): HGBA1C in the last 72 hours. CBG: Recent Labs  Lab 09/03/20 2340  GLUCAP 47*   Lipid Profile: No results for  input(s): CHOL, HDL, LDLCALC, TRIG, CHOLHDL, LDLDIRECT in the last 72 hours. Thyroid Function Tests: No results for input(s): TSH, T4TOTAL, FREET4, T3FREE, THYROIDAB in the last 72 hours. Anemia Panel: No results for input(s): VITAMINB12, FOLATE, FERRITIN, TIBC, IRON, RETICCTPCT in the last 72 hours. Sepsis Labs: Recent Labs  Lab 09/03/20 2230 09/03/20 2308  PROCALCITON  --  <0.10  LATICACIDVEN 1.9  --     Recent Results (from the past 240 hour(s))  Resp Panel by RT-PCR (Flu A&B, Covid) Nasopharyngeal Swab     Status: None   Collection Time: 09/03/20  8:28 PM   Specimen: Nasopharyngeal Swab; Nasopharyngeal(NP) swabs in vial transport medium  Result Value Ref Range Status   SARS Coronavirus 2 by RT PCR NEGATIVE NEGATIVE Final    Comment: (NOTE) SARS-CoV-2 target nucleic acids are NOT DETECTED.  The SARS-CoV-2 RNA is generally detectable in upper respiratory specimens during the acute phase of infection. The lowest concentration of SARS-CoV-2 viral copies this assay can detect is 138 copies/mL. A negative result does not preclude SARS-Cov-2 infection and should not be used as the sole basis for treatment or other patient management decisions. A negative result may occur with  improper specimen collection/handling, submission of specimen other than nasopharyngeal swab, presence of viral mutation(s) within the areas targeted by this assay, and inadequate number of viral copies(<138 copies/mL). A negative result must be combined with clinical observations, patient history, and epidemiological information. The expected result is Negative.  Fact Sheet for Patients:  EntrepreneurPulse.com.au  Fact Sheet for Healthcare Providers:  IncredibleEmployment.be  This test is no t yet approved or cleared by the [place] FDA and  has been authorized for detection and/or diagnosis of SARS-CoV-2 by FDA under an Emergency Use Authorization (EUA).  This  EUA will remain  in effect (meaning this test can be used) for the duration of the COVID-19 declaration under Section 564(b)(1) of the Act, 21 U.S.C.section 360bbb-3(b)(1), unless the authorization is terminated  or revoked sooner.       Influenza A by PCR NEGATIVE NEGATIVE Final   Influenza B by PCR NEGATIVE NEGATIVE Final    Comment: (NOTE) The Xpert Xpress SARS-CoV-2/FLU/RSV plus assay is intended as an aid in the diagnosis of influenza from Nasopharyngeal swab specimens and should not be used as a sole basis for treatment. Nasal washings and aspirates are unacceptable for Xpert Xpress SARS-CoV-2/FLU/RSV testing.  Fact Sheet for Patients: EntrepreneurPulse.com.au  Fact Sheet for Healthcare Providers: IncredibleEmployment.be  This test is not yet approved or cleared by the [place] FDA and has been authorized for detection and/or diagnosis of SARS-CoV-2 by FDA under an Emergency Use Authorization (EUA). This EUA will remain in effect (meaning this test can be used) for the duration of the COVID-19 declaration under Section 564(b)(1) of the Act, 21 U.S.C. section 360bbb-3(b)(1), unless the authorization is terminated or revoked.  Performed at [facility], [place] [address] [place], [place] [address]   Blood culture (routine x 2)     Status: None (Preliminary result)   Collection Time: 09/03/20 11:11 PM   Specimen: BLOOD RIGHT FOREARM  Result Value Ref Range Status   Specimen Description BLOOD RIGHT FOREARM  Final   Special Requests   Final    BOTTLES DRAWN AEROBIC AND ANAEROBIC Blood Culture adequate volume   Culture   Final    NO GROWTH 2 DAYS Performed at [facility], [place] [address] [place], [place] [address]    Report Status PENDING  Incomplete  Blood culture (routine x 2)     Status: None (Preliminary result)   Collection Time: 09/03/20 11:23 PM   Specimen: BLOOD LEFT HAND  Result Value Ref Range Status    Specimen Description BLOOD LEFT HAND  Final   Special Requests   Final    BOTTLES DRAWN AEROBIC AND ANAEROBIC Blood Culture adequate volume   Culture   Final    NO GROWTH 1 DAY Performed at [facility], [place] [address], [place], [place] [address]    Report Status PENDING  Incomplete  Body fluid culture w Gram Stain     Status: None (Preliminary result)   Collection Time: 09/04/20 11:56 AM   Specimen: Lung, Left; Pleural Fluid  Result Value Ref Range Status   Specimen Description FLUID  Final   Special Requests LEFT THORACENTESIS  Final   Gram Stain PENDING  Incomplete   Culture   Final    NO GROWTH < 24 HOURS Performed at [facility], [place] [address] [place], [place] [address]    Report Status PENDING  Incomplete      Radiology Studies: DG Chest 1 View  Result Date: 09/04/2020 CLINICAL DATA:  87 year old male with a history of thoracentesis EXAM: CHEST  1 VIEW COMPARISON:  09/03/2020 FINDINGS: Cardiomediastinal silhouette unchanged with cardiomegaly. Similar appearance of mild central vascular congestion. Improved opacity at the left lung base, right better visualization of the left heart and improved aeration at the base. No pneumothorax. No new confluent airspace disease. Surgical changes of the cervical region incompletely imaged IMPRESSION: Negative for complicating features status post left thoracentesis. Electronically Signed   By: Corrie Mckusick D.O.   On: 09/04/2020 12:31   CT Chest Wo Contrast  Result Date:  09/03/2020 CLINICAL DATA:  Pleural effusion EXAM: CT CHEST WITHOUT CONTRAST TECHNIQUE: Multidetector CT imaging of the chest was performed following the standard protocol without IV contrast. COMPARISON:  04/30/2009 FINDINGS: Cardiovascular: Mild coronary artery calcification. Global cardiac size is mildly enlarged with mild left ventricular dilation. Hypoattenuation of the cardiac blood pool is in keeping with at least mild anemia. No pericardial effusion. The  central pulmonary arteries are enlarged in keeping with changes of pulmonary arterial hypertension. Moderate atherosclerotic calcification is seen within the thoracic aorta. No aortic aneurysm identified. Mediastinum/Nodes: The visualized thyroid is unremarkable. No pathologic thoracic adenopathy. The esophagus is unremarkable. Lungs/Pleura: Large left pleural effusion is present with complete collapse of the left lower lobe and compressive atelectasis of the lingula. Minimal ground-glass infiltrate and traction bronchiolectasis within the right apex likely represents mild fibrotic change. No additional superimposed focal pulmonary infiltrate. No pneumothorax. No pleural effusion on the right. The central airways are widely patent. Upper Abdomen: No acute abnormality. Musculoskeletal: No acute bone abnormality. No lytic or blastic bone lesion. IMPRESSION: Large left pleural effusion with collapse of the left lower lobe. Mild coronary artery calcification. Mild cardiomegaly with left ventricular dilation. Morphologic changes in keeping with pulmonary arterial hypertension. Mild anemia Aortic Atherosclerosis (ICD10-I70.0). Electronically Signed   By: Fidela Salisbury MD   On: 09/03/2020 23:50   US RENAL  Result Date: 09/04/2020 CLINICAL DATA:  Acute kidney failure, history dementia, hypertension, former smoker EXAM: ULTRASOUND RENAL LIMITED COMPARISON:  01/30/2016 FINDINGS: Right Kidney: Renal measurements: 7.8 x 4.0 x 6.5 cm = volume: 109 mL. Cortical thinning. Increased cortical echogenicity. Small cyst at upper pole 16 x 17 x 18 mm. No additional mass or hydronephrosis. No shadowing calcification. Left Kidney: Patient uncooperative/combative, unable to image LEFT kidney. Procedure terminated. IMPRESSION: Medical renal disease changes of RIGHT kidney with small cyst at upper pole. Premature termination of exam due to patient noncompliance, LEFT kidney not imaged. Electronically Signed   By: Lavonia Dana M.D.   On:  09/04/2020 14:31   DG Chest Port 1 View  Result Date: 09/03/2020 CLINICAL DATA:  Altered mental status. EXAM: PORTABLE CHEST 1 VIEW COMPARISON:  June 19, 2019 FINDINGS: There is opacification of the left lung base and mid to lower left lung. A moderate size left pleural effusion is seen. No pneumothorax is identified. The cardiac silhouette is moderately enlarged. There is moderate severity calcification of the aortic arch. Degenerative changes seen throughout the thoracic spine. IMPRESSION: 1. Cardiomegaly with additional findings likely consistent with marked severity left basilar atelectasis and/or infiltrate. Follow-up to resolution is recommended, as an underlying neoplastic process cannot be excluded. 2. Moderate sized left-sided pleural effusion. Electronically Signed   By: Virgina Norfolk M.D.   On: 09/03/2020 20:42   IR THORACENTESIS ASP PLEURAL SPACE W/IMG GUIDE  Result Date: 09/04/2020 INDICATION: Patient with history of dementia and chronic kidney disease. Admitted to the hospital with shortness of breath. Found to have left-sided pleural effusion. Request is for therapeutic and diagnostic left-sided thoracentesis. EXAM: ULTRASOUND GUIDED THERAPEUTIC AND DIAGNOSTIC LEFT-SIDED THORACENTESIS MEDICATIONS: Lidocaine 1% 10 mL COMPLICATIONS: None immediate. PROCEDURE: An ultrasound guided thoracentesis was thoroughly discussed with the patient and questions answered. The benefits, risks, alternatives and complications were also discussed. The patient understands and wishes to proceed with the procedure. Written consent was obtained. Ultrasound was performed to localize and mark an adequate pocket of fluid in the left chest. The area was then prepped and draped in the normal sterile fashion. 1% Lidocaine was used for  local anesthesia. Under ultrasound guidance a 6 Fr Safe-T-Centesis catheter was introduced. Thoracentesis was performed. The catheter was removed and a dressing applied. FINDINGS: A  total of approximately 1.5 L of amber colored fluid was removed. Samples were sent to the laboratory as requested by the clinical team. IMPRESSION: Successful ultrasound guided therapeutic and diagnostic left-sided thoracentesis thoracentesis yielding 1.5 L of pleural fluid. Read by: Rushie Nyhan, NP Electronically Signed   By: Jacqulynn Cadet M.D.   On: 09/04/2020 12:50    Scheduled Meds: . albuterol  4 puff Inhalation Once  . amiodarone  200 mg Oral Daily  . amLODipine  10 mg Oral Daily  . aspirin EC  81 mg Oral Daily  . cholecalciferol  1,000 Units Oral Daily  . dextrose  1 ampule Intravenous Once  . dextrose  1 ampule Intravenous Once  . ferrous sulfate  325 mg Oral Daily  . heparin  5,000 Units Subcutaneous Q8H  . hydrALAZINE  100 mg Oral TID  . insulin aspart  10 Units Intravenous Once  . insulin aspart  10 Units Intravenous Once  . ipratropium  2 puff Inhalation Once  . melatonin  3 mg Oral QHS  . memantine  5 mg Oral Daily  . metoprolol succinate  25 mg Oral Daily  . polyethylene glycol  17 g Oral Daily  . QUEtiapine  12.5 mg Oral QHS  . sodium bicarbonate  650 mg Oral BID  . sodium chloride  2 spray Each Nare Daily  . sodium polystyrene  30 g Oral Once  . tamsulosin  0.4 mg Oral QHS   Continuous Infusions: . sodium chloride 100 mL/hr (09/04/20 2327)  . azithromycin 500 mg (09/05/20 0409)  . cefTRIAXone (ROCEPHIN)  IV 2 g (09/04/20 2329)     LOS: 2 days   Time spent: 30 minutes   Darliss Cheney, MD [facility]  09/05/2020, 1:33 PM   To contact the attending provider between 7A-7P or the covering provider during after hours 7P-7A, please log into the web site [email or web address].

## 2020-09-05 NOTE — Plan of Care (Signed)
  Problem: Education: Goal: Knowledge of disease and its progression will improve Outcome: Not Progressing

## 2020-09-05 NOTE — Progress Notes (Addendum)
Lab called , Critical Lab HGB 6.1. MD notified. Will continue to moniter.

## 2020-09-05 NOTE — Progress Notes (Signed)
Overnight floor coverage progress note  Hemoglobin 6.1 on a.m. labs.  It was 7.3 yesterday and 7.6 back in October 2021.  Suspect anemia is due to advanced kidney disease.  Patient has advanced dementia.  I spoke to his daughter Cortney [place] over the phone and discussed giving a blood transfusion.  Benefits versus risks discussed and patient's daughter has given verbal consent for blood transfusion.  She does mention that while in the hospital yesterday he had a nosebleed.  I spoke to the patient's nurse who did not get any report from daytime staff about patient having nosebleeds.  Per RN his stool is brown in color.  He did undergo thoracentesis yesterday for left-sided pleural effusion.  He does have a history of paroxysmal A. fib but is not on chronic anticoagulation. -Type and screen, 1 unit PRBCs ordered.  Follow-up posttransfusion H&H. -Check FOBT

## 2020-09-06 DIAGNOSIS — Z515 Encounter for palliative care: Secondary | ICD-10-CM | POA: Diagnosis not present

## 2020-09-06 DIAGNOSIS — G301 Alzheimer's disease with late onset: Secondary | ICD-10-CM | POA: Diagnosis not present

## 2020-09-06 DIAGNOSIS — N179 Acute kidney failure, unspecified: Secondary | ICD-10-CM | POA: Diagnosis not present

## 2020-09-06 DIAGNOSIS — Z7189 Other specified counseling: Secondary | ICD-10-CM

## 2020-09-06 LAB — BASIC METABOLIC PANEL
Anion gap: 10 (ref 5–15)
BUN: 51 mg/dL — ABNORMAL HIGH (ref 8–23)
CO2: 20 mmol/L — ABNORMAL LOW (ref 22–32)
Calcium: 8.2 mg/dL — ABNORMAL LOW (ref 8.9–10.3)
Chloride: 113 mmol/L — ABNORMAL HIGH (ref 98–111)
Creatinine, Ser: 3.68 mg/dL — ABNORMAL HIGH (ref 0.61–1.24)
GFR, Estimated: 15 mL/min — ABNORMAL LOW (ref >60)
Glucose, Bld: 97 mg/dL (ref 70–99)
Potassium: 4.6 mmol/L (ref 3.5–5.1)
Sodium: 143 mmol/L (ref 135–145)

## 2020-09-06 LAB — TYPE AND SCREEN
ABO/RH(D): O POS
Antibody Screen: NEGATIVE
Unit division: 0

## 2020-09-06 LAB — CBC WITH DIFFERENTIAL/PLATELET
Abs Immature Granulocytes: 0.07 10*3/uL (ref 0.00–0.07)
Basophils Absolute: 0 10*3/uL (ref 0.0–0.1)
Basophils Relative: 0 %
Eosinophils Absolute: 0 10*3/uL (ref 0.0–0.5)
Eosinophils Relative: 0 %
HCT: 28.1 % — ABNORMAL LOW (ref 39.0–52.0)
Hemoglobin: 8.6 g/dL — ABNORMAL LOW (ref 13.0–17.0)
Immature Granulocytes: 1 %
Lymphocytes Relative: 15 %
Lymphs Abs: 0.9 10*3/uL (ref 0.7–4.0)
MCH: 32.2 pg (ref 26.0–34.0)
MCHC: 30.6 g/dL (ref 30.0–36.0)
MCV: 105.2 fL — ABNORMAL HIGH (ref 80.0–100.0)
Monocytes Absolute: 0.4 10*3/uL (ref 0.1–1.0)
Monocytes Relative: 7 %
Neutro Abs: 4.4 10*3/uL (ref 1.7–7.7)
Neutrophils Relative %: 77 %
Platelets: 216 10*3/uL (ref 150–400)
RBC: 2.67 MIL/uL — ABNORMAL LOW (ref 4.22–5.81)
RDW: 17.2 % — ABNORMAL HIGH (ref 11.5–15.5)
WBC: 5.8 10*3/uL (ref 4.0–10.5)
nRBC: 0 % (ref 0.0–0.2)

## 2020-09-06 LAB — BPAM RBC
Blood Product Expiration Date: 202205122359
ISSUE DATE / TIME: 202204160528
Unit Type and Rh: 5100

## 2020-09-06 LAB — MAGNESIUM: Magnesium: 2.1 mg/dL (ref 1.7–2.4)

## 2020-09-06 MED ORDER — AZITHROMYCIN 250 MG PO TABS
500.0000 mg | ORAL_TABLET | Freq: Every day | ORAL | Status: AC
  Administered 2020-09-07: 500 mg via ORAL
  Filled 2020-09-06: qty 2

## 2020-09-06 MED ORDER — SODIUM CHLORIDE 0.9 % IV SOLN: INTRAVENOUS | Status: DC

## 2020-09-06 NOTE — Progress Notes (Signed)
PROGRESS NOTE    Wayne Fuller  [id number] DOB: 07-28-1923 DOA: 09/03/2020 PCP: [name], NP   Brief Narrative:  Wayne Fuller is a 87 y.o. male with medical history significant of advanced dementia, CKD 4, HTN, PAF not on anticoagulation was brought to the ED with c/o SOB ongoing for past few days.  Associated generalized weakness, poor PO intake over past week or so.  Had 2 days of N/V/D due to viral illness that was going around the SNF between 1-2 weeks ago according to daughter. No reported fevers, fall/trauma.  Upon arrival to ED, he was hypoxic requiring oxygen and also had AKI on CKD stage IV with hyperkalemia.  COVID neg. he did have peaked T waves on EKG.  There was some concern about left lower lobe pneumonia on the chest x-ray for which she was started on Rocephin and Zithromax.  CT chest showed large left-sided pleural effusion.  He was admitted to hospital service.  IR was consulted and he underwent left thoracentesis with 1500 cc of urine.  Fluid analysis indicated transudative fluid.  Assessment & Plan:   Principal Problem:   Acute kidney failure ([place]) Active Problems:   Essential hypertension   CKD (chronic kidney disease) stage 4, GFR 15-29 ml/min (HCC)   Dementia with behavioral disturbance (HCC)   Hyperkalemia   Pleural effusion on left  Acute hypoxic respiratory failure due to large left-sided pleural effusion and possible left lower lobe community-acquired pneumonia: Status post diagnostic and therapeutic thoracentesis 09/04/2020 with a yield of 1500 cc fluid.  Fluid analysis indicate transudative fluid.  Patient's respiratory status has improved.  He is currently on 3 L and saturating 100%.  I once again asked the primary RN to wean him down to room air if possible.  Continue current antibiotics.  All cultures are negative.  AKI on CKD stage IV: Baseline creatinine around 2.5.  Presented with 4.06.  Improved to 3.6, not as much as I had expected.  He  clinically looks dehydrated and has poor p.o. intake so at this point in time, I am going to start him on gentle IV hydration. We will check his labs tomorrow morning.  Hyperkalemia: Resolved.  Acute on chronic anemia: He has anemia of chronic disease due to CKD.  His hemoglobin dropped to 6.1 this morning.  Nocturnist ordered 1 unit of PRBC transfusion which is completed.  Hemoglobin 8.6 today.  Advanced dementia/Wheelchair-bound at baseline: Patient has pretty advanced dementia.  Continue quetiapine to prevent delirium.  Essential hypertension: Blood pressure fairly controlled.  Continue home medications which include amlodipine 10 mg, hydralazine 100 mg p.o. 3 times daily, Toprol-XL 25 mg daily.  Continue labetalol as needed.  Paroxysmal atrial fibrillation: Rate fairly controlled.  Continue amiodarone, Toprol-XL and baby aspirin.  DVT prophylaxis: heparin injection 5,000 Units Start: 09/03/20 2315   Code Status: DNR  Family Communication:  None present at bedside.  Discussed with daughter over the phone yesterday.  Status is: Inpatient  Remains inpatient appropriate because:Inpatient level of care appropriate due to severity of illness   Dispo: The patient is from: SNF              Anticipated d/c is to: SNF              Patient currently is not medically stable to d/c.  Potential discharge in next 1 to 2 days   Difficult to place patient No        Estimated body mass index is 22.92 kg/m  as calculated from the following:   Height as of this encounter: 6' 2.02" (1.88 m).   Weight as of this encounter: 81 kg.      Nutritional status:               Consultants:   IR  Procedures:   Left thoracentesis  Antimicrobials:  Anti-infectives (From admission, onward)   Start     Dose/Rate Route Frequency Ordered Stop   09/04/20 2200  cefTRIAXone (ROCEPHIN) 2 g in sodium chloride 0.9 % 100 mL IVPB        2 g 200 mL/hr over 30 Minutes Intravenous Every 24 hours  09/03/20 2312 09/09/20 2159   09/04/20 2200  azithromycin (ZITHROMAX) 500 mg in sodium chloride 0.9 % 250 mL IVPB        500 mg 250 mL/hr over 60 Minutes Intravenous Every 24 hours 09/03/20 2312 09/09/20 0559   09/04/20 1730  cefTRIAXone (ROCEPHIN) 1 g in sodium chloride 0.9 % 100 mL IVPB  Status:  Discontinued        1 g 200 mL/hr over 30 Minutes Intravenous Every 24 hours 09/04/20 1634 09/04/20 1643   09/04/20 1730  azithromycin (ZITHROMAX) 500 mg in sodium chloride 0.9 % 250 mL IVPB  Status:  Discontinued        500 mg 250 mL/hr over 60 Minutes Intravenous Every 24 hours 09/04/20 1634 09/04/20 1644   09/03/20 2230  cefTRIAXone (ROCEPHIN) 1 g in sodium chloride 0.9 % 100 mL IVPB        1 g 200 mL/hr over 30 Minutes Intravenous  Once 09/03/20 2229 09/03/20 2332   09/03/20 2230  azithromycin (ZITHROMAX) 500 mg in sodium chloride 0.9 % 250 mL IVPB        500 mg 250 mL/hr over 60 Minutes Intravenous  Once 09/03/20 2229 09/04/20 0148         Subjective: Patient seen and examined.  Much more alert today.  Trying to talk but has significant dysarthria.  Completely confused.  At his baseline due to advanced dementia.  Objective: Vitals:   09/05/20 0545 09/05/20 0945 09/06/20 0003 09/06/20 0420  BP: (!) 120/43 (!) 156/49 (!) 148/68 (!) 147/48  Pulse:   73 72  Resp: '18 16 20 18  '$ Temp: 98.2 F (36.8 C) 98 F (36.7 C) 98.9 F (37.2 C) 98.8 F (37.1 C)  TempSrc: Axillary Axillary Oral Oral  SpO2: 100%  100% 100%  Weight:      Height:        Intake/Output Summary (Last 24 hours) at 09/06/2020 1123 Last data filed at 09/06/2020 0500 Gross per 24 hour  Intake --  Output 400 ml  Net -400 ml   Filed Weights   09/04/20 0315  Weight: 81 kg    Examination:  General exam: Appears calm and comfortable  Respiratory system: Clear to auscultation. Respiratory effort normal. Cardiovascular system: S1 & S2 heard, RRR. No JVD, murmurs, rubs, gallops or clicks. No pedal  edema. Gastrointestinal system: Abdomen is nondistended, soft and nontender. No organomegaly or masses felt. Normal bowel sounds heard. Central nervous system: Alert but not oriented. Extremities: Symmetric 5 x 5 power. Skin: No rashes, lesions or ulcers.   Data Reviewed: I have personally reviewed following labs and imaging studies  CBC: Recent Labs  Lab 09/03/20 2027 09/04/20 1249 09/05/20 0118 09/06/20 0556  WBC 5.0 3.8* 3.5* 5.8  NEUTROABS  --   --  2.7 4.4  HGB 7.8* 7.3* 6.1* 8.6*  HCT 27.1*  25.2* 21.2* 28.1*  MCV 113.9* 113.0* 111.0* 105.2*  PLT 264 225 192 [id number]   Basic Metabolic Panel: Recent Labs  Lab 09/03/20 2027 09/03/20 2308 09/03/20 2359 09/04/20 1249 09/05/20 0118 09/06/20 0556  NA 139  --   --  141 143 143  K 7.3* 5.9* 5.7* 6.3* 5.9* 4.6  CL 116*  --   --  117* 117* 113*  CO2 17*  --   --  19* 19* 20*  GLUCOSE 101*  --   --  84 88 97  BUN 61*  --   --  53* 51* 51*  CREATININE 4.06*  --   --  3.79* 3.75* 3.68*  CALCIUM 8.4*  --   --  8.1* 7.8* 8.2*  MG  --   --   --   --   --  2.1   GFR: Estimated Creatinine Clearance: 14.1 mL/min (A) (by C-G formula based on SCr of 3.68 mg/dL (H)). Liver Function Tests: Recent Labs  Lab 09/03/20 2027 09/04/20 1249 09/05/20 0118  AST 25  --  20  ALT 21  --  15  ALKPHOS 86  --  61  BILITOT 0.6  --  0.2*  PROT 7.2 6.2* 5.1*  ALBUMIN 3.3* 2.7* 2.2*   No results for input(s): LIPASE, AMYLASE in the last 168 hours. No results for input(s): AMMONIA in the last 168 hours. Coagulation Profile: No results for input(s): INR, PROTIME in the last 168 hours. Cardiac Enzymes: No results for input(s): CKTOTAL, CKMB, CKMBINDEX, TROPONINI in the last 168 hours. BNP (last 3 results) No results for input(s): PROBNP in the last 8760 hours. HbA1C: No results for input(s): HGBA1C in the last 72 hours. CBG: Recent Labs  Lab 09/03/20 2340 09/05/20 1611  GLUCAP 47* 102*   Lipid Profile: No results for input(s): CHOL, HDL,  LDLCALC, TRIG, CHOLHDL, LDLDIRECT in the last 72 hours. Thyroid Function Tests: No results for input(s): TSH, T4TOTAL, FREET4, T3FREE, THYROIDAB in the last 72 hours. Anemia Panel: No results for input(s): VITAMINB12, FOLATE, FERRITIN, TIBC, IRON, RETICCTPCT in the last 72 hours. Sepsis Labs: Recent Labs  Lab 09/03/20 2230 09/03/20 2308  PROCALCITON  --  <0.10  LATICACIDVEN 1.9  --     Recent Results (from the past 240 hour(s))  Resp Panel by RT-PCR (Flu A&B, Covid) Nasopharyngeal Swab     Status: None   Collection Time: 09/03/20  8:28 PM   Specimen: Nasopharyngeal Swab; Nasopharyngeal(NP) swabs in vial transport medium  Result Value Ref Range Status   SARS Coronavirus 2 by RT PCR NEGATIVE NEGATIVE Final    Comment: (NOTE) SARS-CoV-2 target nucleic acids are NOT DETECTED.  The SARS-CoV-2 RNA is generally detectable in upper respiratory specimens during the acute phase of infection. The lowest concentration of SARS-CoV-2 viral copies this assay can detect is 138 copies/mL. A negative result does not preclude SARS-Cov-2 infection and should not be used as the sole basis for treatment or other patient management decisions. A negative result may occur with  improper specimen collection/handling, submission of specimen other than nasopharyngeal swab, presence of viral mutation(s) within the areas targeted by this assay, and inadequate number of viral copies(<138 copies/mL). A negative result must be combined with clinical observations, patient history, and epidemiological information. The expected result is Negative.  Fact Sheet for Patients:  EntrepreneurPulse.com.au  Fact Sheet for Healthcare Providers:  IncredibleEmployment.be  This test is no t yet approved or cleared by the [place] and  has been authorized for  detection and/or diagnosis of SARS-CoV-2 by FDA under an Emergency Use Authorization (EUA). This EUA will remain  in  effect (meaning this test can be used) for the duration of the COVID-19 declaration under Section 564(b)(1) of the Act, 21 U.S.C.section 360bbb-3(b)(1), unless the authorization is terminated  or revoked sooner.       Influenza A by PCR NEGATIVE NEGATIVE Final   Influenza B by PCR NEGATIVE NEGATIVE Final    Comment: (NOTE) The Xpert Xpress SARS-CoV-2/FLU/RSV plus assay is intended as an aid in the diagnosis of influenza from Nasopharyngeal swab specimens and should not be used as a sole basis for treatment. Nasal washings and aspirates are unacceptable for Xpert Xpress SARS-CoV-2/FLU/RSV testing.  Fact Sheet for Patients: EntrepreneurPulse.com.au  Fact Sheet for Healthcare Providers: IncredibleEmployment.be  This test is not yet approved or cleared by the [place] FDA and has been authorized for detection and/or diagnosis of SARS-CoV-2 by FDA under an Emergency Use Authorization (EUA). This EUA will remain in effect (meaning this test can be used) for the duration of the COVID-19 declaration under Section 564(b)(1) of the Act, 21 U.S.C. section 360bbb-3(b)(1), unless the authorization is terminated or revoked.  Performed at [facility], [place] [address] [place], [place] [address]   Blood culture (routine x 2)     Status: None (Preliminary result)   Collection Time: 09/03/20 11:11 PM   Specimen: BLOOD RIGHT FOREARM  Result Value Ref Range Status   Specimen Description BLOOD RIGHT FOREARM  Final   Special Requests   Final    BOTTLES DRAWN AEROBIC AND ANAEROBIC Blood Culture adequate volume   Culture   Final    NO GROWTH 2 DAYS Performed at [facility], [place] [address] [place], [place] [address]    Report Status PENDING  Incomplete  Blood culture (routine x 2)     Status: None (Preliminary result)   Collection Time: 09/03/20 11:23 PM   Specimen: BLOOD LEFT HAND  Result Value Ref Range Status   Specimen Description  BLOOD LEFT HAND  Final   Special Requests   Final    BOTTLES DRAWN AEROBIC AND ANAEROBIC Blood Culture adequate volume   Culture   Final    NO GROWTH 1 DAY Performed at [facility], [place] [address] [place], [place] [address]    Report Status PENDING  Incomplete  Body fluid culture w Gram Stain     Status: None (Preliminary result)   Collection Time: 09/04/20 11:56 AM   Specimen: Lung, Left; Pleural Fluid  Result Value Ref Range Status   Specimen Description FLUID  Final   Special Requests LEFT THORACENTESIS  Final   Gram Stain NO WBC SEEN NO ORGANISMS SEEN   Final   Culture   Final    NO GROWTH 2 DAYS Performed at [facility], [place] [address], [place], [place] [address]    Report Status PENDING  Incomplete      Radiology Studies: DG Chest 1 View  Result Date: 09/04/2020 CLINICAL DATA:  87 year old male with a history of thoracentesis EXAM: CHEST  1 VIEW COMPARISON:  09/03/2020 FINDINGS: Cardiomediastinal silhouette unchanged with cardiomegaly. Similar appearance of mild central vascular congestion. Improved opacity at the left lung base, right better visualization of the left heart and improved aeration at the base. No pneumothorax. No new confluent airspace disease. Surgical changes of the cervical region incompletely imaged IMPRESSION: Negative for complicating features status post left thoracentesis. Electronically Signed   By:  Corrie Mckusick D.O.   On: 09/04/2020 12:31   US RENAL  Result Date: 09/04/2020 CLINICAL DATA:  Acute kidney failure, history dementia, hypertension, former smoker EXAM: ULTRASOUND RENAL LIMITED COMPARISON:  01/30/2016 FINDINGS: Right Kidney: Renal measurements: 7.8 x 4.0 x 6.5 cm = volume: 109 mL. Cortical thinning. Increased cortical echogenicity. Small cyst at upper pole 16 x 17 x 18 mm. No additional mass or hydronephrosis. No shadowing calcification. Left Kidney: Patient uncooperative/combative, unable to image LEFT kidney. Procedure  terminated. IMPRESSION: Medical renal disease changes of RIGHT kidney with small cyst at upper pole. Premature termination of exam due to patient noncompliance, LEFT kidney not imaged. Electronically Signed   By: Lavonia Dana M.D.   On: 09/04/2020 14:31   IR THORACENTESIS ASP PLEURAL SPACE W/IMG GUIDE  Result Date: 09/04/2020 INDICATION: Patient with history of dementia and chronic kidney disease. Admitted to the hospital with shortness of breath. Found to have left-sided pleural effusion. Request is for therapeutic and diagnostic left-sided thoracentesis. EXAM: ULTRASOUND GUIDED THERAPEUTIC AND DIAGNOSTIC LEFT-SIDED THORACENTESIS MEDICATIONS: Lidocaine 1% 10 mL COMPLICATIONS: None immediate. PROCEDURE: An ultrasound guided thoracentesis was thoroughly discussed with the patient and questions answered. The benefits, risks, alternatives and complications were also discussed. The patient understands and wishes to proceed with the procedure. Written consent was obtained. Ultrasound was performed to localize and mark an adequate pocket of fluid in the left chest. The area was then prepped and draped in the normal sterile fashion. 1% Lidocaine was used for local anesthesia. Under ultrasound guidance a 6 Fr Safe-T-Centesis catheter was introduced. Thoracentesis was performed. The catheter was removed and a dressing applied. FINDINGS: A total of approximately 1.5 L of amber colored fluid was removed. Samples were sent to the laboratory as requested by the clinical team. IMPRESSION: Successful ultrasound guided therapeutic and diagnostic left-sided thoracentesis thoracentesis yielding 1.5 L of pleural fluid. Read by: Rushie Nyhan, NP Electronically Signed   By: Jacqulynn Cadet M.D.   On: 09/04/2020 12:50    Scheduled Meds: . albuterol  4 puff Inhalation Once  . amiodarone  200 mg Oral Daily  . amLODipine  10 mg Oral Daily  . aspirin EC  81 mg Oral Daily  . cholecalciferol  1,000 Units Oral Daily  .  dextrose  1 ampule Intravenous Once  . ferrous sulfate  325 mg Oral Daily  . heparin  5,000 Units Subcutaneous Q8H  . hydrALAZINE  100 mg Oral TID  . insulin aspart  10 Units Intravenous Once  . ipratropium  2 puff Inhalation Once  . melatonin  3 mg Oral QHS  . memantine  5 mg Oral Daily  . metoprolol succinate  25 mg Oral Daily  . polyethylene glycol  17 g Oral Daily  . QUEtiapine  12.5 mg Oral QHS  . sodium bicarbonate  650 mg Oral BID  . sodium chloride  2 spray Each Nare Daily  . tamsulosin  0.4 mg Oral QHS   Continuous Infusions: . sodium chloride 100 mL/hr (09/04/20 2327)  . sodium chloride 75 mL/hr at 09/06/20 1000  . azithromycin 500 mg (09/06/20 0557)  . cefTRIAXone (ROCEPHIN)  IV 2 g (09/04/20 2329)     LOS: 3 days   Time spent: 28 minutes   Darliss Cheney, MD [facility]  09/06/2020, 11:23 AM   To contact the attending provider between 7A-7P or the covering provider during after hours 7P-7A, please log into the web site [email or web address].

## 2020-09-06 NOTE — Plan of Care (Signed)
?  Problem: Education: ?Goal: Knowledge of disease and its progression will improve ?Outcome: Progressing ?  ?Problem: Health Behavior/Discharge Planning: ?Goal: Ability to manage health-related needs will improve ?Outcome: Progressing ?  ?Problem: Clinical Measurements: ?Goal: Complications related to the disease process or treatment will be avoided or minimized ?Outcome: Progressing ?Goal: Dialysis access will remain free of complications ?Outcome: Progressing ?  ?Problem: Activity: ?Goal: Activity intolerance will improve ?Outcome: Progressing ?  ?Problem: Fluid Volume: ?Goal: Fluid volume balance will be maintained or improved ?Outcome: Progressing ?  ?Problem: Nutritional: ?Goal: Ability to make appropriate dietary choices will improve ?Outcome: Progressing ?  ?Problem: Respiratory: ?Goal: Respiratory symptoms related to disease process will be avoided ?Outcome: Progressing ?  ?Problem: Self-Concept: ?Goal: Body image disturbance will be avoided or minimized ?Outcome: Progressing ?  ?Problem: Urinary Elimination: ?Goal: Progression of disease will be identified and treated ?Outcome: Progressing ?  ?

## 2020-09-06 NOTE — Consult Note (Signed)
Consultation Note Date: 09/06/2020   Patient Name: Wayne Fuller  DOB: 1923-06-10  MRN: [id number]  Age / Sex: 87 y.o., male  PCP: Wayne Chandler, NP Referring Physician: Darliss Cheney, MD  Reason for Consultation: Establishing goals of care  HPI/Patient Profile: 87 y.o. male  with past medical history of dementia, hypertension, atrial fibrillation not on any anticoagulation, CKD stage 4 admitted on 09/03/2020 with breathing problems diagnosed with possible pneumonia and large left sided pleural effusion s/p thoracentesis 1500 cc removed. Also with worsening renal function.   Clinical Assessment and Goals of Care: I met today with Wayne Fuller. He is awake and alert and very talkative but very difficult to understand. He denies pain/discomfort. His daughter, Wayne Fuller, arrives to bedside along with her husband. Avis shares that she and her brother, Wayne Fuller, are POA. However, Wayne Fuller recently moved to [place]. There were originally 6 sons and 1 daughter (3 sons are deceased). He lives in ALF where he is wheelchair bound but he peddles around facility. He is able to independently change his shirt and make his bed. She shares that he is very independent.   We discussed Wayne Fuller's current status. His breathing has greatly improved per daughter. We did discuss that his renal function is not improving as we had hoped. We reviewed current blood work compared to admission and before. We discussed hopes that this will improve or plateau adequately. However, even if it maintains at current status it may not take much to send him into end stage renal failure. We reviewed that he would really not be a good candidate for dialysis. She is aware that he could have further deficits in functional status after this acute illness as he did previously after falls. She is hopeful for the best and improvement in renal function.   All  questions/concerns addressed. Emotional support provided. Will discuss further tomorrow.   Primary Decision Maker HCPOA daughter, Wayne Fuller, and son, Wayne Fuller    SUMMARY OF RECOMMENDATIONS   - Hopeful for improvement and return to ALF. - MOST already completed previously and reviewed in electronic records. Plans to review for any necessary changes with family prior to discharge.   Code Status/Advance Care Planning:  DNR   Symptom Management:   Per attending.   Palliative Prophylaxis:   Aspiration, Bowel Regimen, Delirium Protocol, Oral Care and Turn Reposition  Additional Recommendations (Limitations, Scope, Preferences):  Full Scope Treatment  Prognosis:   Overall prognosis poor with dementia and renal failure.   Discharge Planning: To Be Determined      Primary Diagnoses: Present on Admission: . Acute kidney failure ([place]) . CKD (chronic kidney disease) stage 4, GFR 15-29 ml/min ([facility]) . Dementia with behavioral disturbance ([place]) . Essential hypertension . Hyperkalemia . Pleural effusion on left   I have reviewed the medical record, interviewed the patient and family, and examined the patient. The following aspects are pertinent.  Past Medical History:  Diagnosis Date  . BPH (benign prostatic hyperplasia)   . Chronic kidney disease    ckd stage  3  . Dementia ([place])   . GERD (gastroesophageal reflux disease)   . Hemorrhoids   . History of angina   . Hyperlipemia   . Hypertension   . Irregular heart rhythm   . Paroxysmal A-fib ([place]) 04/02/2015   Social History   Socioeconomic History  . Marital status: Married    Spouse name: Not on file  . Number of children: Not on file  . Years of education: Not on file  . Highest education level: Not on file  Occupational History  . Not on file  Tobacco Use  . Smoking status: Former Smoker    Packs/day: 0.50    Years: 10.00    Pack years: 5.00    Types: Cigarettes  . Smokeless tobacco: Never Used  . Tobacco  comment: quit smoking  around 1996  Vaping Use  . Vaping Use: Never used  Substance and Sexual Activity  . Alcohol use: No    Comment: hx of occasional ETOH use  . Drug use: No  . Sexual activity: Not Currently  Other Topics Concern  . Not on file  Social History Narrative   Diet? Regular      Do you drink/eat things with caffeine? Soda, sometimes      Marital status?      widow                              What year were you married? 41 years      Do you live in a house, apartment, assisted living, condo, trailer, etc.? house      Is it one or more stories? n/a      How many persons live in your home? 1      Do you have any pets in your home? (please list) no      Current or past profession: work in Associate Professor      Do you exercise?          no                            Type & how often?      Do you have a living will?  yes      Do you have a DNR form?     no                             If not, do you want to discuss one?      Do you have signed POA/HPOA for forms? yes         Social Determinants of Health   Financial Resource Strain: Not on file  Food Insecurity: Not on file  Transportation Needs: Not on file  Physical Activity: Not on file  Stress: Not on file  Social Connections: Not on file   Family History  Problem Relation Age of Onset  . Stroke Mother   . Hypertension Sister   . Other Son        Homicide, stabbed  . Stroke Son   . Hypertension Son   . Diabetes Son   . Hypertension Son    Scheduled Meds: . albuterol  4 puff Inhalation Once  . amiodarone  200 mg Oral Daily  . amLODipine  10 mg Oral Daily  . aspirin EC  81 mg Oral Daily  . [START  ON 09/07/2020] azithromycin  500 mg Oral Daily  . cholecalciferol  1,000 Units Oral Daily  . dextrose  1 ampule Intravenous Once  . ferrous sulfate  325 mg Oral Daily  . heparin  5,000 Units Subcutaneous Q8H  . hydrALAZINE  100 mg Oral TID  . insulin aspart  10 Units Intravenous Once  .  ipratropium  2 puff Inhalation Once  . melatonin  3 mg Oral QHS  . memantine  5 mg Oral Daily  . metoprolol succinate  25 mg Oral Daily  . polyethylene glycol  17 g Oral Daily  . QUEtiapine  12.5 mg Oral QHS  . sodium bicarbonate  650 mg Oral BID  . sodium chloride  2 spray Each Nare Daily  . tamsulosin  0.4 mg Oral QHS   Continuous Infusions: . sodium chloride 75 mL/hr at 09/06/20 1000  . cefTRIAXone (ROCEPHIN)  IV 2 g (09/04/20 2329)   PRN Meds:.acetaminophen, famotidine, guaiFENesin, haloperidol lactate, labetalol, magnesium hydroxide, neomycin-bacitracin-polymyxin, Resource ThickenUp Clear No Known Allergies Review of Systems  Unable to perform ROS: Dementia    Physical Exam Vitals and nursing note reviewed.  Constitutional:      General: He is not in acute distress.    Appearance: He is ill-appearing.  Cardiovascular:     Rate and Rhythm: Normal rate.  Pulmonary:     Effort: No tachypnea, accessory muscle usage or respiratory distress.  Abdominal:     General: Abdomen is flat.     Palpations: Abdomen is soft.  Neurological:     Mental Status: He is alert. He is confused.     Vital Signs: BP (!) 178/70 (BP Location: Left Arm)   Pulse 69   Temp 98.2 F (36.8 C) (Oral)   Resp 16   Ht 6' 2.02" (1.88 m)   Wt 81 kg   SpO2 100%   BMI 22.92 kg/m  Pain Scale: 0-10   Pain Score: 0-No pain   SpO2: SpO2: 100 % O2 Device:SpO2: 100 % O2 Flow Rate: .O2 Flow Rate (L/min): 3 L/min  IO: Intake/output summary:   Intake/Output Summary (Last 24 hours) at 09/06/2020 1634 Last data filed at 09/06/2020 0500 Gross per 24 hour  Intake --  Output 400 ml  Net -400 ml    LBM: Last BM Date: 09/05/20 Baseline Weight: Weight: 81 kg Most recent weight: Weight: 81 kg     Palliative Assessment/Data:     Time In: 1520 Time Out: 1630 Time Total: 70 min Greater than 50%  of this time was spent counseling and coordinating care related to the above assessment and  plan.  Signed by: Vinie Sill, NP Palliative Medicine Team Pager # [phone] (M-F 8a-5p) Team Phone # [phone] (Nights/Weekends)

## 2020-09-07 DIAGNOSIS — Z7189 Other specified counseling: Secondary | ICD-10-CM | POA: Diagnosis not present

## 2020-09-07 DIAGNOSIS — N179 Acute kidney failure, unspecified: Secondary | ICD-10-CM | POA: Diagnosis not present

## 2020-09-07 DIAGNOSIS — G301 Alzheimer's disease with late onset: Secondary | ICD-10-CM | POA: Diagnosis not present

## 2020-09-07 DIAGNOSIS — Z515 Encounter for palliative care: Secondary | ICD-10-CM | POA: Diagnosis not present

## 2020-09-07 LAB — CBC WITH DIFFERENTIAL/PLATELET
Abs Immature Granulocytes: 0.02 10*3/uL (ref 0.00–0.07)
Basophils Absolute: 0 10*3/uL (ref 0.0–0.1)
Basophils Relative: 0 %
Eosinophils Absolute: 0 10*3/uL (ref 0.0–0.5)
Eosinophils Relative: 1 %
HCT: 25.4 % — ABNORMAL LOW (ref 39.0–52.0)
Hemoglobin: 7.7 g/dL — ABNORMAL LOW (ref 13.0–17.0)
Immature Granulocytes: 1 %
Lymphocytes Relative: 7 %
Lymphs Abs: 0.3 10*3/uL — ABNORMAL LOW (ref 0.7–4.0)
MCH: 32.4 pg (ref 26.0–34.0)
MCHC: 30.3 g/dL (ref 30.0–36.0)
MCV: 106.7 fL — ABNORMAL HIGH (ref 80.0–100.0)
Monocytes Absolute: 0.3 10*3/uL (ref 0.1–1.0)
Monocytes Relative: 7 %
Neutro Abs: 3.4 10*3/uL (ref 1.7–7.7)
Neutrophils Relative %: 84 %
Platelets: 196 10*3/uL (ref 150–400)
RBC: 2.38 MIL/uL — ABNORMAL LOW (ref 4.22–5.81)
RDW: 16.4 % — ABNORMAL HIGH (ref 11.5–15.5)
WBC: 4 10*3/uL (ref 4.0–10.5)
nRBC: 0 % (ref 0.0–0.2)

## 2020-09-07 LAB — BASIC METABOLIC PANEL
Anion gap: 5 (ref 5–15)
BUN: 42 mg/dL — ABNORMAL HIGH (ref 8–23)
CO2: 21 mmol/L — ABNORMAL LOW (ref 22–32)
Calcium: 8 mg/dL — ABNORMAL LOW (ref 8.9–10.3)
Chloride: 118 mmol/L — ABNORMAL HIGH (ref 98–111)
Creatinine, Ser: 3.39 mg/dL — ABNORMAL HIGH (ref 0.61–1.24)
GFR, Estimated: 16 mL/min — ABNORMAL LOW (ref >60)
Glucose, Bld: 88 mg/dL (ref 70–99)
Potassium: 4.4 mmol/L (ref 3.5–5.1)
Sodium: 144 mmol/L (ref 135–145)

## 2020-09-07 LAB — CYTOLOGY - NON PAP

## 2020-09-07 LAB — PH, BODY FLUID: pH, Body Fluid: 7.8

## 2020-09-07 NOTE — Progress Notes (Signed)
Palliative:  HPI: 87 y.o. male  with past medical history of dementia, hypertension, atrial fibrillation not on any anticoagulation, CKD stage 4 admitted on 09/03/2020 with breathing problems diagnosed with possible pneumonia and large left sided pleural effusion s/p thoracentesis 1500 cc removed. Also with worsening renal function.    I met today with Wayne Fuller and he is awake and talking. Back on oxygen this morning and RN/NT report more issues with his swallowing with more cough with his breakfast. His kidney function has slightly improved and I discussed that the plan for IV fluids and ongoing support for his kidneys are up to Wayne Fuller. We discussed concern with kidney failure and aspiration risk. I worry that he may not recover back to his previous status (functional or kidney status) and that it would not take much to trigger further decline that may not be recoverable. I worry about his life and quality of life moving forward. I discussed with family the potential option of hospice to add to his care at memory care ALF. I encouraged family to continue to discuss Wayne Fuller's status and quality of life and what he would find acceptable. I discussed this with both daughter, Wayne Fuller, over phone and then with son, Wayne Fuller, in person in room.   All questions/concerns addressed. Emotional support provided.   Exam: Alert, confused. Mumbles and difficult to understand. When I returned to room later he is more lethargic and less interactive. He is coughing more. No distress. Breathing regular, unlabored. Abd flat. Generalized weakness and fatigue.   Plan: - Ongoing conversations amongst family. Would benefit from outpatient palliative to follow.   [place], NP Palliative Medicine Team Pager [phone] (Please see [email or web address] for schedule) Team Phone [phone]    Greater than 50%  of this time was spent counseling and coordinating care related to the above assessment and plan

## 2020-09-07 NOTE — Evaluation (Signed)
Physical Therapy Evaluation Patient Details Name: Wayne Fuller MRN: [id number] DOB: 1924/04/24 Today's Date: 09/07/2020   History of Present Illness  Pt is 87 yo male who presented to the ED on 09/03/20 with shortness of breath.  He has now been admitted with AKI, hyperkalemia, acute resp failure with L pleural effusion s/p thoracentesis on 09/04/20 yielding 1500 cc fluid.  Pt with medical hx of advanced dementia, CKD 4, HTN, and PAF.  Clinical Impression   Pt admitted with above diagnosis. Pt with hx of advanced dementia and from memory care unit but is typically able to pivot to w/c and mobilize.  He does require assist for all ADLs.  Today, pt requiring mod A for squat pivot and stand pivot.  Requiring multimodal cues and mod A to steady.  Unable to reach family members by phone.  Did speak to staff member at [facility] who provided PLOF but were unable to speak to facilities ability to manage at current level. Pt is below his baseline and will benefit from PT to advance.  Pt currently with functional limitations due to the deficits listed below (see PT Problem List). Pt will benefit from skilled PT to increase their independence and safety with mobility to allow discharge to the venue listed below - ideally pt would be able to return to his ALF to be in familiar environment if they are able to provide increased assistance.      Follow Up Recommendations Other (comment);Supervision/Assistance - 24 hour (Pt from Memory Unit at ALF - he is below his baseline requiring mod A for transfers if they are able to provide this level of support could return with therapy; if not then will need SNF)    Equipment Recommendations  None recommended by PT    Recommendations for Other Services       Precautions / Restrictions Precautions Precautions: Fall      Mobility  Bed Mobility Overal bed mobility: Needs Assistance Bed Mobility: Supine to Sit;Sit to Supine     Supine to sit: Min  assist Sit to supine: Min assist   General bed mobility comments: Increased time; multi modal cues    Transfers Overall transfer level: Needs assistance Equipment used: None;Rolling walker (2 wheeled) Transfers: Sit to/from [employer] Transfers;Stand Pivot Transfers Sit to Stand: Mod assist Stand pivot transfers: Mod assist Squat pivot transfers: Mod assist     General transfer comment: Performed squat pivot with mod A to chair.  Performed sit to stand from chair and then pivot back to bed with RW with mod A.  Requiring multimodal cues, increased time, mod A for stability and to rise for all transfers  Ambulation/Gait Ambulation/Gait assistance: Mod assist Gait Distance (Feet): 2 Feet Assistive device: Rolling walker (2 wheeled) Gait Pattern/deviations: Step-to pattern;Decreased stride length;Narrow base of support;Trunk flexed Gait velocity: decreased   General Gait Details: Small steps from chair back to bed, pt taking steps away from bed and very unsteady requiring mod A to steady and to get directed to backing toward bed  Stairs            Wheelchair Mobility    Modified Rankin (Stroke Patients Only)       Balance Overall balance assessment: Needs assistance Sitting-balance support: No upper extremity supported Sitting balance-Leahy Scale: Fair     Standing balance support: Bilateral upper extremity supported Standing balance-Leahy Scale: Poor Standing balance comment: requiring UE support and min-mod A  Pertinent Vitals/Pain Pain Assessment: No/denies pain Faces Pain Scale: No hurt    Home Living Family/patient expects to be discharged to:: Unsure                 Additional Comments: Pt is from [facility]F    Prior Function Level of Independence: Needs assistance   Gait / Transfers Assistance Needed: non ambulatory; could pivot to w/c at times on his own; could mobilize in  w/c  ADL's / Homemaking Assistance Needed: required assist  Comments: Pt unable to provide PLOF, attempted to call multiple family members but unable to reach; spoke with staff member at [facility] who provided above information     Hand Dominance        Extremity/Trunk Assessment   Upper Extremity Assessment Upper Extremity Assessment: Generalized weakness (Unable to follow commands; tremors throughout; generalized weakness)    Lower Extremity Assessment Lower Extremity Assessment: Generalized weakness (Unable to follow commands; tremors throughout; generalized weakness)    Cervical / Trunk Assessment Cervical / Trunk Assessment: Normal  Communication   Communication: Expressive difficulties (Pt very talkative but difficult to understand)  Cognition Arousal/Alertness: Awake/alert Behavior During Therapy: Restless Overall Cognitive Status: History of cognitive impairments - at baseline                                 General Comments: Hx of advanced dementia      General Comments      Exercises     Assessment/Plan    PT Assessment Patient needs continued PT services  PT Problem List Decreased strength;Decreased mobility;Decreased safety awareness;Decreased coordination;Decreased activity tolerance;Decreased balance;Decreased knowledge of use of DME;Decreased knowledge of precautions       PT Treatment Interventions DME instruction;Therapeutic activities;Therapeutic exercise;Gait training;Patient/family education;Balance training;Wheelchair mobility training;Neuromuscular re-education;Functional mobility training (gait training for transfers only)    PT Goals (Current goals can be found in the Care Plan section)  Acute Rehab PT Goals Patient Stated Goal: unable to state ; did attempt to call numbers for family in chart but unable to reach PT Goal Formulation: All assessment and education complete, DC therapy Time For Goal Achievement:  09/21/20 Potential to Achieve Goals: Fair Additional Goals Additional Goal #1: Pt will propel w/c 150' with supervision    Frequency Min 2X/week   Barriers to discharge        Co-evaluation               AM-PAC PT "6 Clicks" Mobility  Outcome Measure Help needed turning from your back to your side while in a flat bed without using bedrails?: A Lot Help needed moving from lying on your back to sitting on the side of a flat bed without using bedrails?: A Lot Help needed moving to and from a bed to a chair (including a wheelchair)?: A Lot Help needed standing up from a chair using your arms (e.g., wheelchair or bedside chair)?: A Lot Help needed to walk in hospital room?: Total Help needed climbing 3-5 steps with a railing? : Total 6 Click Score: 10    End of Session Equipment Utilized During Treatment: Gait belt Activity Tolerance: Patient tolerated treatment well;Other (comment) (limited due to cognition) Patient left: in bed;with bed alarm set;with call bell/phone within reach [facility] locked 30 degrees) Nurse Communication: Mobility status PT Visit Diagnosis: Other abnormalities of gait and mobility (R26.89);Muscle weakness (generalized) (M62.81)    Time: [id number] PT Time Calculation (  min) (ACUTE ONLY): 20 min   Charges:   PT Evaluation $PT Eval Low Complexity: 1 Low          Harol Shabazz, PT Acute Rehab Services Pager [phone] [facility] [phone]    Karlton Lemon 09/07/2020, 3:56 PM

## 2020-09-07 NOTE — Progress Notes (Signed)
  Speech Language Pathology Treatment: Dysphagia  Patient Details Name: Wayne Fuller MRN: [id number] DOB: 28-Aug-1923 Today's Date: 09/07/2020 Time: [id number] SLP Time Calculation (min) (ACUTE ONLY): 17 min  Assessment / Plan / Recommendation Clinical Impression  Pt asleep in bed and initially difficult to increase alterness despite frequent verbal/tactile cues. Son present and reported that pt has been very lethargic ever since completion of pm meal. After some discussion with family, upright positioning of pt in bed and verbal cueing, pt opened eyes and began responding to this clinician's verbal cues. With hand over hand assist to take small sips of thin and NTL, pt demonstrated immediate, wet cough and vocal quality which was cleared with strength of cough across x3 trials. Pt attention and alertness fleeting even once obtained for PO intake, which may have impacted swallow function. NT reports that pt had difficulty with am meal, exhibiting perseveration on mastication and coughing with dysphagia 2 solids after every swallow. Recommend dysphagia 1 (puree), NTL diet at this time with SLP to f/u for diet tolerance and plan for instrumental testing as indicated. Above communicated with nursing.   HPI HPI: Pt is a 87 y.o. male with medical history significant of advanced dementia, CKD 4, HTN, PAF not on anticoagulation. Pt presented to the ED with c/o SOB and and poor oral intake over the past week. CT chest 4/14: Large left pleural effusion with collapse of the left lower lobe. [name] in December, 2019; pt demonstrated impulsivity and a dysphagia 2 diet with thin liquids was recommended at this time.      SLP Plan  Continue with current plan of care;Other (Comment) (MBS if warranted)       Recommendations  Diet recommendations: Nectar-thick liquid;Dysphagia 1 (puree) Liquids provided via: Cup Medication Administration: Whole meds with puree Supervision: Full supervision/cueing  for compensatory strategies Compensations: Slow rate;Small sips/bites;Minimize environmental distractions Postural Changes and/or Swallow Maneuvers: Seated upright 90 degrees                Oral Care Recommendations: Oral care BID Follow up Recommendations:  (TBD) SLP Visit Diagnosis: Dysphagia, unspecified (R13.10) Plan: Continue with current plan of care;Other (Comment) (MBS if warranted)       [place], [place], [place] Office Number: [phone]  Acie Fredrickson 09/07/2020, 2:07 PM

## 2020-09-07 NOTE — Progress Notes (Signed)
PROGRESS NOTE    Wayne Fuller  [id number] DOB: 10-Nov-1923 DOA: 09/03/2020 PCP: [name], NP   Brief Narrative:  Wayne Fuller is a 87 y.o. male with medical history significant of advanced dementia, CKD 4, HTN, PAF not on anticoagulation was brought to the ED with c/o SOB ongoing for past few days.  Associated generalized weakness, poor PO intake over past week or so.  Had 2 days of N/V/D due to viral illness that was going around the SNF between 1-2 weeks ago according to daughter. No reported fevers, fall/trauma.  Upon arrival to ED, he was hypoxic requiring oxygen and also had AKI on CKD stage IV with hyperkalemia.  COVID neg. he did have peaked T waves on EKG.  There was some concern about left lower lobe pneumonia on the chest x-ray for which she was started on Rocephin and Zithromax.  CT chest showed large left-sided pleural effusion.  He was admitted to hospital service.  IR was consulted and he underwent left thoracentesis with 1500 cc of urine.  Fluid analysis indicated transudative fluid.  Assessment & Plan:   Principal Problem:   Acute kidney failure ([place]) Active Problems:   Essential hypertension   CKD (chronic kidney disease) stage 4, GFR 15-29 ml/min (HCC)   Dementia with behavioral disturbance (HCC)   Hyperkalemia   Pleural effusion on left  Acute hypoxic respiratory failure due to large left-sided pleural effusion and possible left lower lobe community-acquired pneumonia: Status post diagnostic and therapeutic thoracentesis 09/04/2020 with a yield of 1500 cc fluid.  Fluid analysis indicate transudative fluid.  Patient's respiratory status has improved.  He was on room air for several hours but again he is requiring 1 to 2 L of oxygen.  Continue current antibiotics.  Cultures negative.  AKI on CKD stage IV: Baseline creatinine around 2.5.  Presented with 4.06.  Improved to 3.4, will continue IV fluids.  Recheck labs in the morning.  Hyperkalemia:  Resolved.  Acute on chronic anemia: He has anemia of chronic disease due to CKD.  His hemoglobin dropped to 6.1 this morning.  Nocturnist ordered 1 unit of PRBC transfusion which is completed.  Hemoglobin improved to 8.6 and now down to 7.7.  Pete labs in the morning.  Advanced dementia/Wheelchair-bound at baseline: Patient has pretty advanced dementia.  Continue quetiapine to prevent delirium.  Essential hypertension: Blood pressure fairly controlled.  Continue home medications which include amlodipine 10 mg, hydralazine 100 mg p.o. 3 times daily, Toprol-XL 25 mg daily.  Continue labetalol as needed.  Paroxysmal atrial fibrillation: Rate fairly controlled.  Continue amiodarone, Toprol-XL and baby aspirin.  DVT prophylaxis: heparin injection 5,000 Units Start: 09/03/20 2315   Code Status: DNR  Family Communication: Son present at the bedside.  Lengthy discussion with the son.  Palliative care at bedside as well  Status is: Inpatient  Remains inpatient appropriate because:Inpatient level of care appropriate due to severity of illness   Dispo: The patient is from: SNF              Anticipated d/c is to: SNF              Patient currently is not medically stable to d/c.  Potential discharge in next 1 to 2 days   Difficult to place patient No        Estimated body mass index is 22.92 kg/m as calculated from the following:   Height as of this encounter: 6' 2.02" (1.88 m).   Weight as of this  encounter: 81 kg.      Nutritional status:               Consultants:   IR  Procedures:   Left thoracentesis  Antimicrobials:  Anti-infectives (From admission, onward)   Start     Dose/Rate Route Frequency Ordered Stop   09/07/20 1000  azithromycin (ZITHROMAX) tablet 500 mg        500 mg Oral Daily 09/06/20 1156 09/07/20 0952   09/04/20 2200  cefTRIAXone (ROCEPHIN) 2 g in sodium chloride 0.9 % 100 mL IVPB        2 g 200 mL/hr over 30 Minutes Intravenous Every 24 hours  09/03/20 2312 09/09/20 2159   09/04/20 2200  azithromycin (ZITHROMAX) 500 mg in sodium chloride 0.9 % 250 mL IVPB  Status:  Discontinued        500 mg 250 mL/hr over 60 Minutes Intravenous Every 24 hours 09/03/20 2312 09/06/20 1156   09/04/20 1730  cefTRIAXone (ROCEPHIN) 1 g in sodium chloride 0.9 % 100 mL IVPB  Status:  Discontinued        1 g 200 mL/hr over 30 Minutes Intravenous Every 24 hours 09/04/20 1634 09/04/20 1643   09/04/20 1730  azithromycin (ZITHROMAX) 500 mg in sodium chloride 0.9 % 250 mL IVPB  Status:  Discontinued        500 mg 250 mL/hr over 60 Minutes Intravenous Every 24 hours 09/04/20 1634 09/04/20 1644   09/03/20 2230  cefTRIAXone (ROCEPHIN) 1 g in sodium chloride 0.9 % 100 mL IVPB        1 g 200 mL/hr over 30 Minutes Intravenous  Once 09/03/20 2229 09/03/20 2332   09/03/20 2230  azithromycin (ZITHROMAX) 500 mg in sodium chloride 0.9 % 250 mL IVPB        500 mg 250 mL/hr over 60 Minutes Intravenous  Once 09/03/20 2229 09/04/20 0148         Subjective: Patient seen and examined.  Alert but not oriented.  Severe dysarthria.  Objective: Vitals:   09/06/20 2100 09/06/20 2257 09/07/20 0345 09/07/20 0807  BP: (!) 180/59 (!) 168/66 (!) 165/58 (!) 155/47  Pulse: 73 71 70 65  Resp: '18 16 18 20  '$ Temp: 97.6 F (36.4 C) 97.6 F (36.4 C) 97.6 F (36.4 C) 98.6 F (37 C)  TempSrc: Oral Oral Oral Oral  SpO2: 98% 100% 99% 98%  Weight:      Height:        Intake/Output Summary (Last 24 hours) at 09/07/2020 1349 Last data filed at 09/07/2020 0346 Gross per 24 hour  Intake --  Output 350 ml  Net -350 ml   Filed Weights   09/04/20 0315  Weight: 81 kg    Examination:  General exam: Appears calm and comfortable  Respiratory system: Diminished breath sounds. Respiratory effort normal. Cardiovascular system: S1 & S2 heard, RRR. No JVD, murmurs, rubs, gallops or clicks. No pedal edema. Gastrointestinal system: Abdomen is nondistended, soft and nontender. No  organomegaly or masses felt. Normal bowel sounds heard. Central nervous system: Alert but not oriented.  Moving all extremities spontaneously. Extremities: Symmetric 5 x 5 power. Skin: No rashes, lesions or ulcers.    Data Reviewed: I have personally reviewed following labs and imaging studies  CBC: Recent Labs  Lab 09/03/20 2027 09/04/20 1249 09/05/20 0118 09/06/20 0556 09/07/20 0847  WBC 5.0 3.8* 3.5* 5.8 4.0  NEUTROABS  --   --  2.7 4.4 3.4  HGB 7.8* 7.3* 6.1* 8.6* 7.7*  HCT 27.1* 25.2* 21.2* 28.1* 25.4*  MCV 113.9* 113.0* 111.0* 105.2* 106.7*  PLT 264 225 192 216 [id number]   Basic Metabolic Panel: Recent Labs  Lab 09/03/20 2027 09/03/20 2308 09/03/20 2359 09/04/20 1249 09/05/20 0118 09/06/20 0556 09/07/20 0847  NA 139  --   --  141 143 143 144  K 7.3*   < > 5.7* 6.3* 5.9* 4.6 4.4  CL 116*  --   --  117* 117* 113* 118*  CO2 17*  --   --  19* 19* 20* 21*  GLUCOSE 101*  --   --  84 88 97 88  BUN 61*  --   --  53* 51* 51* 42*  CREATININE 4.06*  --   --  3.79* 3.75* 3.68* 3.39*  CALCIUM 8.4*  --   --  8.1* 7.8* 8.2* 8.0*  MG  --   --   --   --   --  2.1  --    < > = values in this interval not displayed.   GFR: Estimated Creatinine Clearance: 15.3 mL/min (A) (by C-G formula based on SCr of 3.39 mg/dL (H)). Liver Function Tests: Recent Labs  Lab 09/03/20 2027 09/04/20 1249 09/05/20 0118  AST 25  --  20  ALT 21  --  15  ALKPHOS 86  --  61  BILITOT 0.6  --  0.2*  PROT 7.2 6.2* 5.1*  ALBUMIN 3.3* 2.7* 2.2*   No results for input(s): LIPASE, AMYLASE in the last 168 hours. No results for input(s): AMMONIA in the last 168 hours. Coagulation Profile: No results for input(s): INR, PROTIME in the last 168 hours. Cardiac Enzymes: No results for input(s): CKTOTAL, CKMB, CKMBINDEX, TROPONINI in the last 168 hours. BNP (last 3 results) No results for input(s): PROBNP in the last 8760 hours. HbA1C: No results for input(s): HGBA1C in the last 72 hours. CBG: Recent Labs   Lab 09/03/20 2340 09/05/20 1611  GLUCAP 47* 102*   Lipid Profile: No results for input(s): CHOL, HDL, LDLCALC, TRIG, CHOLHDL, LDLDIRECT in the last 72 hours. Thyroid Function Tests: No results for input(s): TSH, T4TOTAL, FREET4, T3FREE, THYROIDAB in the last 72 hours. Anemia Panel: No results for input(s): VITAMINB12, FOLATE, FERRITIN, TIBC, IRON, RETICCTPCT in the last 72 hours. Sepsis Labs: Recent Labs  Lab 09/03/20 2230 09/03/20 2308  PROCALCITON  --  <0.10  LATICACIDVEN 1.9  --     Recent Results (from the past 240 hour(s))  Resp Panel by RT-PCR (Flu A&B, Covid) Nasopharyngeal Swab     Status: None   Collection Time: 09/03/20  8:28 PM   Specimen: Nasopharyngeal Swab; Nasopharyngeal(NP) swabs in vial transport medium  Result Value Ref Range Status   SARS Coronavirus 2 by RT PCR NEGATIVE NEGATIVE Final    Comment: (NOTE) SARS-CoV-2 target nucleic acids are NOT DETECTED.  The SARS-CoV-2 RNA is generally detectable in upper respiratory specimens during the acute phase of infection. The lowest concentration of SARS-CoV-2 viral copies this assay can detect is 138 copies/mL. A negative result does not preclude SARS-Cov-2 infection and should not be used as the sole basis for treatment or other patient management decisions. A negative result may occur with  improper specimen collection/handling, submission of specimen other than nasopharyngeal swab, presence of viral mutation(s) within the areas targeted by this assay, and inadequate number of viral copies(<138 copies/mL). A negative result must be combined with clinical observations, patient history, and epidemiological information. The expected result is Negative.  Fact Sheet for  Patients:  EntrepreneurPulse.com.au  Fact Sheet for Healthcare Providers:  IncredibleEmployment.be  This test is no t yet approved or cleared by the [place] FDA and  has been authorized for detection  and/or diagnosis of SARS-CoV-2 by FDA under an Emergency Use Authorization (EUA). This EUA will remain  in effect (meaning this test can be used) for the duration of the COVID-19 declaration under Section 564(b)(1) of the Act, 21 U.S.C.section 360bbb-3(b)(1), unless the authorization is terminated  or revoked sooner.       Influenza A by PCR NEGATIVE NEGATIVE Final   Influenza B by PCR NEGATIVE NEGATIVE Final    Comment: (NOTE) The Xpert Xpress SARS-CoV-2/FLU/RSV plus assay is intended as an aid in the diagnosis of influenza from Nasopharyngeal swab specimens and should not be used as a sole basis for treatment. Nasal washings and aspirates are unacceptable for Xpert Xpress SARS-CoV-2/FLU/RSV testing.  Fact Sheet for Patients: EntrepreneurPulse.com.au  Fact Sheet for Healthcare Providers: IncredibleEmployment.be  This test is not yet approved or cleared by the [place] FDA and has been authorized for detection and/or diagnosis of SARS-CoV-2 by FDA under an Emergency Use Authorization (EUA). This EUA will remain in effect (meaning this test can be used) for the duration of the COVID-19 declaration under Section 564(b)(1) of the Act, 21 U.S.C. section 360bbb-3(b)(1), unless the authorization is terminated or revoked.  Performed at [facility], [place] [address] [place], [place] [address]   Blood culture (routine x 2)     Status: None (Preliminary result)   Collection Time: 09/03/20 11:11 PM   Specimen: BLOOD RIGHT FOREARM  Result Value Ref Range Status   Specimen Description BLOOD RIGHT FOREARM  Final   Special Requests   Final    BOTTLES DRAWN AEROBIC AND ANAEROBIC Blood Culture adequate volume   Culture   Final    NO GROWTH 4 DAYS Performed at [facility], [place] [address] [place], [place] [address]    Report Status PENDING  Incomplete  Blood culture (routine x 2)     Status: None (Preliminary result)   Collection  Time: 09/03/20 11:23 PM   Specimen: BLOOD LEFT HAND  Result Value Ref Range Status   Specimen Description BLOOD LEFT HAND  Final   Special Requests   Final    BOTTLES DRAWN AEROBIC AND ANAEROBIC Blood Culture adequate volume   Culture   Final    NO GROWTH 3 DAYS Performed at [facility], [place] [address] [place], [place] [address]    Report Status PENDING  Incomplete  Body fluid culture w Gram Stain     Status: None (Preliminary result)   Collection Time: 09/04/20 11:56 AM   Specimen: Lung, Left; Pleural Fluid  Result Value Ref Range Status   Specimen Description FLUID  Final   Special Requests LEFT THORACENTESIS  Final   Gram Stain NO WBC SEEN NO ORGANISMS SEEN   Final   Culture   Final    NO GROWTH 3 DAYS Performed at [facility], [address], [place], [place] [address]    Report Status PENDING  Incomplete      Radiology Studies: No results found.  Scheduled Meds: . albuterol  4 puff Inhalation Once  . amiodarone  200 mg Oral Daily  . amLODipine  10 mg Oral Daily  . aspirin EC  81 mg Oral Daily  . cholecalciferol  1,000 Units Oral Daily  . dextrose  1 ampule Intravenous Once  .  ferrous sulfate  325 mg Oral Daily  . heparin  5,000 Units Subcutaneous Q8H  . hydrALAZINE  100 mg Oral TID  . insulin aspart  10 Units Intravenous Once  . ipratropium  2 puff Inhalation Once  . melatonin  3 mg Oral QHS  . memantine  5 mg Oral Daily  . metoprolol succinate  25 mg Oral Daily  . polyethylene glycol  17 g Oral Daily  . QUEtiapine  12.5 mg Oral QHS  . sodium bicarbonate  650 mg Oral BID  . sodium chloride  2 spray Each Nare Daily  . tamsulosin  0.4 mg Oral QHS   Continuous Infusions: . sodium chloride 75 mL/hr at 09/06/20 1000  . cefTRIAXone (ROCEPHIN)  IV 2 g (09/06/20 2011)     LOS: 4 days   Time spent: 29 minutes   Darliss Cheney, MD [facility]  09/07/2020, 1:49 PM   To contact the attending provider between 7A-7P or the covering provider  during after hours 7P-7A, please log into the web site [email or web address].

## 2020-09-08 ENCOUNTER — Inpatient Hospital Stay (HOSPITAL_COMMUNITY): Payer: Medicare Other

## 2020-09-08 DIAGNOSIS — N184 Chronic kidney disease, stage 4 (severe): Secondary | ICD-10-CM | POA: Diagnosis not present

## 2020-09-08 DIAGNOSIS — Z515 Encounter for palliative care: Secondary | ICD-10-CM | POA: Diagnosis not present

## 2020-09-08 DIAGNOSIS — N179 Acute kidney failure, unspecified: Secondary | ICD-10-CM | POA: Diagnosis not present

## 2020-09-08 DIAGNOSIS — G301 Alzheimer's disease with late onset: Secondary | ICD-10-CM | POA: Diagnosis not present

## 2020-09-08 DIAGNOSIS — Z7189 Other specified counseling: Secondary | ICD-10-CM | POA: Diagnosis not present

## 2020-09-08 LAB — BODY FLUID CULTURE W GRAM STAIN
Culture: NO GROWTH
Gram Stain: NONE SEEN

## 2020-09-08 LAB — BASIC METABOLIC PANEL
Anion gap: 7 (ref 5–15)
BUN: 44 mg/dL — ABNORMAL HIGH (ref 8–23)
CO2: 20 mmol/L — ABNORMAL LOW (ref 22–32)
Calcium: 8.2 mg/dL — ABNORMAL LOW (ref 8.9–10.3)
Chloride: 119 mmol/L — ABNORMAL HIGH (ref 98–111)
Creatinine, Ser: 3.18 mg/dL — ABNORMAL HIGH (ref 0.61–1.24)
GFR, Estimated: 17 mL/min — ABNORMAL LOW (ref >60)
Glucose, Bld: 85 mg/dL (ref 70–99)
Potassium: 5.2 mmol/L — ABNORMAL HIGH (ref 3.5–5.1)
Sodium: 146 mmol/L — ABNORMAL HIGH (ref 135–145)

## 2020-09-08 LAB — CBC WITH DIFFERENTIAL/PLATELET
Abs Immature Granulocytes: 0.02 10*3/uL (ref 0.00–0.07)
Basophils Absolute: 0 10*3/uL (ref 0.0–0.1)
Basophils Relative: 0 %
Eosinophils Absolute: 0 10*3/uL (ref 0.0–0.5)
Eosinophils Relative: 1 %
HCT: 27.4 % — ABNORMAL LOW (ref 39.0–52.0)
Hemoglobin: 8.2 g/dL — ABNORMAL LOW (ref 13.0–17.0)
Immature Granulocytes: 1 %
Lymphocytes Relative: 16 %
Lymphs Abs: 0.7 10*3/uL (ref 0.7–4.0)
MCH: 31.8 pg (ref 26.0–34.0)
MCHC: 29.9 g/dL — ABNORMAL LOW (ref 30.0–36.0)
MCV: 106.2 fL — ABNORMAL HIGH (ref 80.0–100.0)
Monocytes Absolute: 0.3 10*3/uL (ref 0.1–1.0)
Monocytes Relative: 8 %
Neutro Abs: 3.3 10*3/uL (ref 1.7–7.7)
Neutrophils Relative %: 74 %
Platelets: 190 10*3/uL (ref 150–400)
RBC: 2.58 MIL/uL — ABNORMAL LOW (ref 4.22–5.81)
RDW: 16.5 % — ABNORMAL HIGH (ref 11.5–15.5)
WBC: 4.4 10*3/uL (ref 4.0–10.5)
nRBC: 0 % (ref 0.0–0.2)

## 2020-09-08 LAB — CULTURE, BLOOD (ROUTINE X 2)
Culture: NO GROWTH
Special Requests: ADEQUATE

## 2020-09-08 LAB — SARS CORONAVIRUS 2 (TAT 6-24 HRS): SARS Coronavirus 2: NEGATIVE

## 2020-09-08 MED ORDER — SENNA 8.6 MG PO TABS
1.0000 | ORAL_TABLET | Freq: Every day | ORAL | Status: DC
  Administered 2020-09-08 – 2020-09-11 (×3): 8.6 mg via ORAL
  Filled 2020-09-08 (×3): qty 1

## 2020-09-08 NOTE — Progress Notes (Signed)
Palliative:  HPI: 87 y.o.malewith past medical history of dementia, hypertension, atrial fibrillation not on any anticoagulation, CKD stage 4admitted on 4/14/2022with breathing problems diagnosed with possible pneumonia and large left sided pleural effusion s/p thoracentesis 1500 cc removed.Also with worsening renal function.  I met today at Wayne Fuller's bedside. No family present. He is more alert and talking (still difficult to understand). Renal function has continued to improve. Appears that he ate 100% of lunch food and liquids on tray. Not as congested or cough today and RN reports he seemed to tolerate intake better today. Plans for MBSS tomorrow.   I attempted to call and provide update to daughter, Wayne Fuller, but no answer. I called and spoke with son, Wayne Fuller, and explained the above. Wayne Fuller plans to come back to visit tomorrow and allowing time for other family members to visit today. Wayne Fuller reports hope for further improvement, return to ALF, and they do not want hospice at current time. Would benefit from outpatient palliative to follow.   All questions/concerns addressed. Emotional support provided.   Alert, confused. Mumbles and difficult to understand. No distress. Breathing regular, unlabored. Abd flat. Generalized weakness and fatigue.  Plan: - Optimize with plans to return to memory care ALF with palliative to follow.   25 min  Vinie Sill, NP Palliative Medicine Team Pager [phone] (Please see [email or web address] for schedule) Team Phone [phone]    Greater than 50%  of this time was spent counseling and coordinating care related to the above assessment and plan

## 2020-09-08 NOTE — Care Management Important Message (Signed)
Important Message  Patient Details  Name: Wayne Fuller MRN: [id number] Date of Birth: [birth year]   Medicare Important Message Given:  Yes     Kattaleya Alia 09/08/2020, 4:20 PM

## 2020-09-08 NOTE — Progress Notes (Signed)
PROGRESS NOTE    Wayne Fuller  [id number] DOB: 02-25-1924 DOA: 09/03/2020 PCP: [name], NP   Brief Narrative:  Wayne Fuller is a 87 y.o. male with medical history significant of advanced dementia, CKD 4, HTN, PAF not on anticoagulation was brought to the ED with c/o SOB ongoing for past few days.  Associated generalized weakness, poor PO intake over past week or so.  Had 2 days of N/V/D due to viral illness that was going around the SNF between 1-2 weeks ago according to daughter. No reported fevers, fall/trauma.  Upon arrival to ED, he was hypoxic requiring oxygen and also had AKI on CKD stage IV with hyperkalemia.  COVID neg. he did have peaked T waves on EKG.  There was some concern about left lower lobe pneumonia on the chest x-ray for which he was started on Rocephin and Zithromax.  CT chest showed large left-sided pleural effusion.  He was admitted to hospital service.  IR was consulted and he underwent left thoracentesis with 1500 cc of urine.  Fluid analysis indicated transudative fluid.  Patient's oxygen demand improved.  He was then started on gentle IV hydration as his p.o. intake was not enough.  With that, his renal function started to improve as well.  He also developed acute on chronic anemia where his hemoglobin dropped to 6.1 on 09/05/2020 for which she received 1 unit of PRBC suspicion and since then his hemoglobin remained stable over 7.5.  Assessment & Plan:   Principal Problem:   Acute kidney failure ([place]) Active Problems:   Essential hypertension   CKD (chronic kidney disease) stage 4, GFR 15-29 ml/min (HCC)   Dementia with behavioral disturbance (HCC)   Hyperkalemia   Pleural effusion on left  Acute hypoxic respiratory failure due to large left-sided pleural effusion and possible left lower lobe community-acquired pneumonia: Status post diagnostic and therapeutic thoracentesis 09/04/2020 with a yield of 1500 cc fluid.  Fluid analysis indicate transudative  fluid.  Patient's respiratory status has improved.  He is now back to room air.  Checking follow-up chest x-ray today.  AKI on CKD stage IV: Baseline creatinine around 2.5.  Presented with 4.06.  Improved to 3.18, continue IV fluids.  Checking labs in the morning.  Hyperkalemia: Mild at 5.2.  Hopefully it will resolve with IV fluids.  Acute on chronic anemia: He has anemia of chronic disease due to CKD.  His hemoglobin dropped to 6.1 this morning.  Nocturnist ordered 1 unit of PRBC transfusion which is completed.  Hemoglobin improved to 8.6 and now down to 7.7.    Advanced dementia/Wheelchair-bound at baseline: Patient has pretty advanced dementia.  Continue quetiapine to prevent delirium.  Essential hypertension: Blood pressure fairly controlled.  Continue home medications which include amlodipine 10 mg, hydralazine 100 mg p.o. 3 times daily, Toprol-XL 25 mg daily.  Continue labetalol as needed.  Paroxysmal atrial fibrillation: Rate fairly controlled.  Continue amiodarone, Toprol-XL and baby aspirin.  Goal of care discussion: Palliative care on board.  Patient DNR.  DVT prophylaxis: heparin injection 5,000 Units Start: 09/03/20 2315   Code Status: DNR  Family Communication: None present at bedside today.  Status is: Inpatient  Remains inpatient appropriate because:Inpatient level of care appropriate due to severity of illness   Dispo: The patient is from: SNF              Anticipated d/c is to: SNF              Patient currently is  not medically stable to d/c.  Will be discharged tomorrow per TOC.   Difficult to place patient No        Estimated body mass index is 22.92 kg/m as calculated from the following:   Height as of this encounter: 6' 2.02" (1.88 m).   Weight as of this encounter: 81 kg.      Nutritional status:               Consultants:   IR  Procedures:   Left thoracentesis  Antimicrobials:  Anti-infectives (From admission, onward)   Start      Dose/Rate Route Frequency Ordered Stop   09/07/20 1000  azithromycin (ZITHROMAX) tablet 500 mg        500 mg Oral Daily 09/06/20 1156 09/07/20 0952   09/04/20 2200  cefTRIAXone (ROCEPHIN) 2 g in sodium chloride 0.9 % 100 mL IVPB        2 g 200 mL/hr over 30 Minutes Intravenous Every 24 hours 09/03/20 2312 09/09/20 2159   09/04/20 2200  azithromycin (ZITHROMAX) 500 mg in sodium chloride 0.9 % 250 mL IVPB  Status:  Discontinued        500 mg 250 mL/hr over 60 Minutes Intravenous Every 24 hours 09/03/20 2312 09/06/20 1156   09/04/20 1730  cefTRIAXone (ROCEPHIN) 1 g in sodium chloride 0.9 % 100 mL IVPB  Status:  Discontinued        1 g 200 mL/hr over 30 Minutes Intravenous Every 24 hours 09/04/20 1634 09/04/20 1643   09/04/20 1730  azithromycin (ZITHROMAX) 500 mg in sodium chloride 0.9 % 250 mL IVPB  Status:  Discontinued        500 mg 250 mL/hr over 60 Minutes Intravenous Every 24 hours 09/04/20 1634 09/04/20 1644   09/03/20 2230  cefTRIAXone (ROCEPHIN) 1 g in sodium chloride 0.9 % 100 mL IVPB        1 g 200 mL/hr over 30 Minutes Intravenous  Once 09/03/20 2229 09/03/20 2332   09/03/20 2230  azithromycin (ZITHROMAX) 500 mg in sodium chloride 0.9 % 250 mL IVPB        500 mg 250 mL/hr over 60 Minutes Intravenous  Once 09/03/20 2229 09/04/20 0148         Subjective: Seen and examined.  Patient alert but completely incoherent.  Pleasant.  No other complaint.  Objective: Vitals:   09/07/20 2300 09/08/20 0442 09/08/20 0749 09/08/20 1206  BP: (!) 165/102 (!) 181/55 (!) 167/65 (!) 151/46  Pulse: 66 63 68 60  Resp: '20 16  16  '$ Temp: 98 F (36.7 C) 97.8 F (36.6 C) 98 F (36.7 C)   TempSrc: Oral Oral Oral   SpO2: 99% 98% 99% 97%  Weight:      Height:        Intake/Output Summary (Last 24 hours) at 09/08/2020 1257 Last data filed at 09/07/2020 1516 Gross per 24 hour  Intake --  Output 750 ml  Net -750 ml   Filed Weights   09/04/20 0315  Weight: 81 kg     Examination: General exam: Appears calm and comfortable  Respiratory system: Diminished breath sounds bilaterally, normal respiratory effort. Cardiovascular system: S1 & S2 heard, RRR. No JVD, murmurs, rubs, gallops or clicks. No pedal edema. Gastrointestinal system: Abdomen is nondistended, soft and nontender. No organomegaly or masses felt. Normal bowel sounds heard. Central nervous system: Alert but not oriented.  No focal deficit. Extremities: Symmetric 5 x 5 power. Skin: No rashes, lesions or ulcers.  Data Reviewed: I have personally reviewed following labs and imaging studies  CBC: Recent Labs  Lab 09/04/20 1249 09/05/20 0118 09/06/20 0556 09/07/20 0847 09/08/20 0354  WBC 3.8* 3.5* 5.8 4.0 4.4  NEUTROABS  --  2.7 4.4 3.4 3.3  HGB 7.3* 6.1* 8.6* 7.7* 8.2*  HCT 25.2* 21.2* 28.1* 25.4* 27.4*  MCV 113.0* 111.0* 105.2* 106.7* 106.2*  PLT 225 192 216 196 [id number]   Basic Metabolic Panel: Recent Labs  Lab 09/04/20 1249 09/05/20 0118 09/06/20 0556 09/07/20 0847 09/08/20 0354  NA 141 143 143 144 146*  K 6.3* 5.9* 4.6 4.4 5.2*  CL 117* 117* 113* 118* 119*  CO2 19* 19* 20* 21* 20*  GLUCOSE 84 88 97 88 85  BUN 53* 51* 51* 42* 44*  CREATININE 3.79* 3.75* 3.68* 3.39* 3.18*  CALCIUM 8.1* 7.8* 8.2* 8.0* 8.2*  MG  --   --  2.1  --   --    GFR: Estimated Creatinine Clearance: 16.3 mL/min (A) (by C-G formula based on SCr of 3.18 mg/dL (H)). Liver Function Tests: Recent Labs  Lab 09/03/20 2027 09/04/20 1249 09/05/20 0118  AST 25  --  20  ALT 21  --  15  ALKPHOS 86  --  61  BILITOT 0.6  --  0.2*  PROT 7.2 6.2* 5.1*  ALBUMIN 3.3* 2.7* 2.2*   No results for input(s): LIPASE, AMYLASE in the last 168 hours. No results for input(s): AMMONIA in the last 168 hours. Coagulation Profile: No results for input(s): INR, PROTIME in the last 168 hours. Cardiac Enzymes: No results for input(s): CKTOTAL, CKMB, CKMBINDEX, TROPONINI in the last 168 hours. BNP (last 3  results) No results for input(s): PROBNP in the last 8760 hours. HbA1C: No results for input(s): HGBA1C in the last 72 hours. CBG: Recent Labs  Lab 09/03/20 2340 09/05/20 1611  GLUCAP 47* 102*   Lipid Profile: No results for input(s): CHOL, HDL, LDLCALC, TRIG, CHOLHDL, LDLDIRECT in the last 72 hours. Thyroid Function Tests: No results for input(s): TSH, T4TOTAL, FREET4, T3FREE, THYROIDAB in the last 72 hours. Anemia Panel: No results for input(s): VITAMINB12, FOLATE, FERRITIN, TIBC, IRON, RETICCTPCT in the last 72 hours. Sepsis Labs: Recent Labs  Lab 09/03/20 2230 09/03/20 2308  PROCALCITON  --  <0.10  LATICACIDVEN 1.9  --     Recent Results (from the past 240 hour(s))  Resp Panel by RT-PCR (Flu A&B, Covid) Nasopharyngeal Swab     Status: None   Collection Time: 09/03/20  8:28 PM   Specimen: Nasopharyngeal Swab; Nasopharyngeal(NP) swabs in vial transport medium  Result Value Ref Range Status   SARS Coronavirus 2 by RT PCR NEGATIVE NEGATIVE Final    Comment: (NOTE) SARS-CoV-2 target nucleic acids are NOT DETECTED.  The SARS-CoV-2 RNA is generally detectable in upper respiratory specimens during the acute phase of infection. The lowest concentration of SARS-CoV-2 viral copies this assay can detect is 138 copies/mL. A negative result does not preclude SARS-Cov-2 infection and should not be used as the sole basis for treatment or other patient management decisions. A negative result may occur with  improper specimen collection/handling, submission of specimen other than nasopharyngeal swab, presence of viral mutation(s) within the areas targeted by this assay, and inadequate number of viral copies(<138 copies/mL). A negative result must be combined with clinical observations, patient history, and epidemiological information. The expected result is Negative.  Fact Sheet for Patients:  EntrepreneurPulse.com.au  Fact Sheet for Healthcare Providers:   IncredibleEmployment.be  This test is no  t yet approved or cleared by the [place] and  has been authorized for detection and/or diagnosis of SARS-CoV-2 by FDA under an Emergency Use Authorization (EUA). This EUA will remain  in effect (meaning this test can be used) for the duration of the COVID-19 declaration under Section 564(b)(1) of the Act, 21 U.S.C.section 360bbb-3(b)(1), unless the authorization is terminated  or revoked sooner.       Influenza A by PCR NEGATIVE NEGATIVE Final   Influenza B by PCR NEGATIVE NEGATIVE Final    Comment: (NOTE) The Xpert Xpress SARS-CoV-2/FLU/RSV plus assay is intended as an aid in the diagnosis of influenza from Nasopharyngeal swab specimens and should not be used as a sole basis for treatment. Nasal washings and aspirates are unacceptable for Xpert Xpress SARS-CoV-2/FLU/RSV testing.  Fact Sheet for Patients: EntrepreneurPulse.com.au  Fact Sheet for Healthcare Providers: IncredibleEmployment.be  This test is not yet approved or cleared by the [place] FDA and has been authorized for detection and/or diagnosis of SARS-CoV-2 by FDA under an Emergency Use Authorization (EUA). This EUA will remain in effect (meaning this test can be used) for the duration of the COVID-19 declaration under Section 564(b)(1) of the Act, 21 U.S.C. section 360bbb-3(b)(1), unless the authorization is terminated or revoked.  Performed at [facility], [place] [address], [place], [place] [address]   Blood culture (routine x 2)     Status: None   Collection Time: 09/03/20 11:11 PM   Specimen: BLOOD RIGHT FOREARM  Result Value Ref Range Status   Specimen Description BLOOD RIGHT FOREARM  Final   Special Requests   Final    BOTTLES DRAWN AEROBIC AND ANAEROBIC Blood Culture adequate volume   Culture   Final    NO GROWTH 5 DAYS Performed at [facility], [place] [address] [place],  [place] [address]    Report Status 09/08/2020 FINAL  Final  Blood culture (routine x 2)     Status: None (Preliminary result)   Collection Time: 09/03/20 11:23 PM   Specimen: BLOOD LEFT HAND  Result Value Ref Range Status   Specimen Description BLOOD LEFT HAND  Final   Special Requests   Final    BOTTLES DRAWN AEROBIC AND ANAEROBIC Blood Culture adequate volume   Culture   Final    NO GROWTH 4 DAYS Performed at [facility], [place] [address], [place], [place] [address]    Report Status PENDING  Incomplete  Body fluid culture w Gram Stain     Status: None   Collection Time: 09/04/20 11:56 AM   Specimen: Lung, Left; Pleural Fluid  Result Value Ref Range Status   Specimen Description FLUID  Final   Special Requests LEFT THORACENTESIS  Final   Gram Stain NO WBC SEEN NO ORGANISMS SEEN   Final   Culture   Final    NO GROWTH 3 DAYS Performed at [facility], [address], [place], [place] [address]    Report Status 09/08/2020 FINAL  Final      Radiology Studies: No results found.  Scheduled Meds: . albuterol  4 puff Inhalation Once  . amiodarone  200 mg Oral Daily  . amLODipine  10 mg Oral Daily  . aspirin EC  81 mg Oral Daily  . cholecalciferol  1,000 Units Oral Daily  . dextrose  1 ampule Intravenous Once  . ferrous sulfate  325 mg Oral Daily  . heparin  5,000 Units Subcutaneous Q8H  . hydrALAZINE  100 mg Oral TID  . insulin aspart  10 Units Intravenous Once  . ipratropium  2 puff Inhalation Once  . melatonin  3 mg Oral QHS  . memantine  5 mg Oral Daily  . metoprolol succinate  25 mg Oral Daily  . polyethylene glycol  17 g Oral Daily  . QUEtiapine  12.5 mg Oral QHS  . senna  1 tablet Oral Daily  . sodium bicarbonate  650 mg Oral BID  . sodium chloride  2 spray Each Nare Daily  . tamsulosin  0.4 mg Oral QHS   Continuous Infusions: . sodium chloride 75 mL/hr at 09/06/20 1000  . cefTRIAXone (ROCEPHIN)  IV 2 g (09/07/20 2046)     LOS: 5 days   Time spent: 27  minutes   Darliss Cheney, MD [facility]  09/08/2020, 12:57 PM   To contact the attending provider between 7A-7P or the covering provider during after hours 7P-7A, please log into the web site [email or web address].

## 2020-09-08 NOTE — TOC Progression Note (Signed)
Transition of Care [facility]) - Progression Note    Patient Details  Name: Wayne Fuller MRN: [id number] Date of Birth: 12/04/1923  Transition of Care [facility]) CM/SW [place], RN Phone Number: [phone]  09/08/2020, 8:55 AM  Clinical Narrative:    CM called Stephan Minister to speak with [facility]. Per conversation yesterday CM would need to speak with [facility] in reference to patients return. CM spoke with [place] states that the DON is out of the office today and will not return until 4/20 '@0900'$ . Per Karena Addison the DON is the only person that can review FL2 and d/c summary to determine that patient is able to return. Facility will not be able to accept patient back on today. Message has been sent to MD and CM has requested that nurse collect covid test.         Expected Discharge Plan and Services                                                 Social Determinants of Health (SDOH) Interventions    Readmission Risk Interventions No flowsheet data found.

## 2020-09-08 NOTE — Progress Notes (Signed)
  Speech Language Pathology Treatment: Dysphagia  Patient Details Name: Wayne Fuller MRN: [id number] DOB: 1923-12-31 Today's Date: 09/08/2020 Time: [id number] SLP Time Calculation (min) (ACUTE ONLY): 14 min  Assessment / Plan / Recommendation Clinical Impression  Improvement in level of alertness much improved from last session, however pt very inattentive and verbalizing continuously, requiring clinician to provide constant verbal cues for pt to attend to PO trials. With administered cup sips of thin liquids, pt exhibited audible swallows, immediate coughing/throat clearing across 3 trials and suspect delay in swallow initiation. NTL via cup improved clinical s/sx of aspiration, however audible swallows and throat clears persisted. Upgraded trials of dysphagia 2 solid tolerated well at bedside, however with constant verbalizations during PO intake, concerned for safety with more advanced solids. As session progressed, pt breathing turned into wheezing, which was reported to nursing. Recommend pt continue on dysphagia 1, NTL diet with plan for Modified Barium Swallow Study (MBSS) tomorrow to assess current swallow function and aspiration risk.    HPI HPI: Pt is a 87 y.o. male with medical history significant of advanced dementia, CKD 4, HTN, PAF not on anticoagulation. Pt presented to the ED with c/o SOB and and poor oral intake over the past week. CT chest 4/14: Large left pleural effusion with collapse of the left lower lobe. [name] in December, 2019; pt demonstrated impulsivity and a dysphagia 2 diet with thin liquids was recommended at this time.      SLP Plan  Continue with current plan of care;MBS       Recommendations  Diet recommendations: Dysphagia 1 (puree);Nectar-thick liquid Liquids provided via: Cup Medication Administration: Whole meds with puree Supervision: Full supervision/cueing for compensatory strategies;Staff to assist with self feeding Compensations:  Minimize environmental distractions;Slow rate;Small sips/bites Postural Changes and/or Swallow Maneuvers: Seated upright 90 degrees                Oral Care Recommendations: Oral care BID Follow up Recommendations: 24 hour supervision/assistance;Skilled Nursing facility SLP Visit Diagnosis: Dysphagia, unspecified (R13.10) Plan: Continue with current plan of care;MBS       [place], [place], [place] Office Number: [phone]  Wayne Fuller 09/08/2020, 1:40 PM

## 2020-09-09 ENCOUNTER — Inpatient Hospital Stay (HOSPITAL_COMMUNITY): Payer: Medicare Other

## 2020-09-09 DIAGNOSIS — N179 Acute kidney failure, unspecified: Secondary | ICD-10-CM | POA: Diagnosis not present

## 2020-09-09 DIAGNOSIS — G301 Alzheimer's disease with late onset: Secondary | ICD-10-CM | POA: Diagnosis not present

## 2020-09-09 DIAGNOSIS — Z515 Encounter for palliative care: Secondary | ICD-10-CM | POA: Diagnosis not present

## 2020-09-09 DIAGNOSIS — F0281 Dementia in other diseases classified elsewhere with behavioral disturbance: Secondary | ICD-10-CM | POA: Diagnosis not present

## 2020-09-09 DIAGNOSIS — Z7189 Other specified counseling: Secondary | ICD-10-CM | POA: Diagnosis not present

## 2020-09-09 HISTORY — PX: IR THORACENTESIS ASP PLEURAL SPACE W/IMG GUIDE: IMG5380

## 2020-09-09 LAB — CBC WITH DIFFERENTIAL/PLATELET
Abs Immature Granulocytes: 0.02 10*3/uL (ref 0.00–0.07)
Basophils Absolute: 0 10*3/uL (ref 0.0–0.1)
Basophils Relative: 0 %
Eosinophils Absolute: 0 10*3/uL (ref 0.0–0.5)
Eosinophils Relative: 1 %
HCT: 26.9 % — ABNORMAL LOW (ref 39.0–52.0)
Hemoglobin: 7.7 g/dL — ABNORMAL LOW (ref 13.0–17.0)
Immature Granulocytes: 1 %
Lymphocytes Relative: 10 %
Lymphs Abs: 0.4 10*3/uL — ABNORMAL LOW (ref 0.7–4.0)
MCH: 31.7 pg (ref 26.0–34.0)
MCHC: 28.6 g/dL — ABNORMAL LOW (ref 30.0–36.0)
MCV: 110.7 fL — ABNORMAL HIGH (ref 80.0–100.0)
Monocytes Absolute: 0.3 10*3/uL (ref 0.1–1.0)
Monocytes Relative: 7 %
Neutro Abs: 3.4 10*3/uL (ref 1.7–7.7)
Neutrophils Relative %: 81 %
Platelets: 194 10*3/uL (ref 150–400)
RBC: 2.43 MIL/uL — ABNORMAL LOW (ref 4.22–5.81)
RDW: 16.3 % — ABNORMAL HIGH (ref 11.5–15.5)
WBC: 4.1 10*3/uL (ref 4.0–10.5)
nRBC: 0 % (ref 0.0–0.2)

## 2020-09-09 LAB — BASIC METABOLIC PANEL
Anion gap: 7 (ref 5–15)
BUN: 42 mg/dL — ABNORMAL HIGH (ref 8–23)
CO2: 19 mmol/L — ABNORMAL LOW (ref 22–32)
Calcium: 8.2 mg/dL — ABNORMAL LOW (ref 8.9–10.3)
Chloride: 120 mmol/L — ABNORMAL HIGH (ref 98–111)
Creatinine, Ser: 3 mg/dL — ABNORMAL HIGH (ref 0.61–1.24)
GFR, Estimated: 19 mL/min — ABNORMAL LOW (ref >60)
Glucose, Bld: 120 mg/dL — ABNORMAL HIGH (ref 70–99)
Potassium: 4.8 mmol/L (ref 3.5–5.1)
Sodium: 146 mmol/L — ABNORMAL HIGH (ref 135–145)

## 2020-09-09 LAB — CULTURE, BLOOD (ROUTINE X 2)
Culture: NO GROWTH
Special Requests: ADEQUATE

## 2020-09-09 MED ORDER — SODIUM CHLORIDE 0.45 % IV SOLN
INTRAVENOUS | Status: DC
  Filled 2020-09-09 (×2): qty 75

## 2020-09-09 MED ORDER — LIDOCAINE HCL (PF) 1 % IJ SOLN
INTRAMUSCULAR | Status: AC | PRN
  Administered 2020-09-09: 5 mL

## 2020-09-09 MED ORDER — LIDOCAINE HCL (PF) 1 % IJ SOLN
INTRAMUSCULAR | Status: AC
  Filled 2020-09-09: qty 30

## 2020-09-09 NOTE — Procedures (Signed)
PROCEDURE SUMMARY:  Successful US guided left thoracentesis. Yielded 1.7 L of bloody fluid. Patient tolerated procedure well. No immediate complications. EBL = trace  Post procedure chest X-ray pending.  Annalissa Murphey S Jaidan Stachnik PA-C 09/09/2020 4:22 PM

## 2020-09-09 NOTE — Progress Notes (Signed)
PROGRESS NOTE    Wayne Fuller  [id number] DOB: 1924/02/10 DOA: 09/03/2020 PCP: [name], NP   Brief Narrative:  Wayne Fuller is a 87 y.o. male with medical history significant of advanced dementia, CKD 4, HTN, PAF not on anticoagulation was brought to the ED with c/o SOB ongoing for past few days.  Associated generalized weakness, poor PO intake over past week or so.  Had 2 days of N/V/D due to viral illness that was going around the SNF between 1-2 weeks ago according to daughter. No reported fevers, fall/trauma.  Upon arrival to ED, he was hypoxic requiring oxygen and also had AKI on CKD stage IV with hyperkalemia.  COVID neg. he did have peaked T waves on EKG.  There was some concern about left lower lobe pneumonia on the chest x-ray for which he was started on Rocephin and Zithromax.  CT chest showed large left-sided pleural effusion.  He was admitted to hospital service.  IR was consulted and he underwent left thoracentesis with 1500 cc of urine.  Fluid analysis indicated transudative fluid.  Patient's oxygen demand improved.  He was then started on gentle IV hydration as his p.o. intake was not enough.  With that, his renal function started to improve as well.  He also developed acute on chronic anemia where his hemoglobin dropped to 6.1 on 09/05/2020 for which she received 1 unit of PRBC suspicion and since then his hemoglobin remained stable over 7.5.  Assessment & Plan:   Principal Problem:   Acute kidney failure ([place]) Active Problems:   Essential hypertension   CKD (chronic kidney disease) stage 4, GFR 15-29 ml/min (HCC)   Dementia with behavioral disturbance (HCC)   Hyperkalemia   Pleural effusion on left  Acute hypoxic respiratory failure due to large left-sided pleural effusion and possible left lower lobe community-acquired pneumonia: Status post diagnostic and therapeutic thoracentesis 09/04/2020 with a yield of 1500 cc fluid.  Fluid analysis indicate transudative  fluid.  Patient's respiratory status has improved.  He is now back to room air however strangely x-ray from yesterday shows worsened left large pleural effusion.  I have consulted IR again for therapeutic thoracentesis.  AKI on CKD stage IV/Mild metabolic acidosis: Baseline creatinine around 2.5.  Presented with 4.06.  Improved to 3.0.  CO2 down to 19.  We will stop normal saline.  Switch to bicarb drip with gentle hydration.  Hyperkalemia: Resolved  Acute on chronic anemia: He has anemia of chronic disease due to CKD.  His hemoglobin dropped to 6.1 this morning.  Nocturnist ordered 1 unit of PRBC transfusion which is completed.  Hemoglobin improved to 8.6 and now down to 7.7.    Advanced dementia/Wheelchair-bound at baseline: Patient has pretty advanced dementia.  Continue quetiapine to prevent delirium.  Essential hypertension: Blood pressure fairly controlled.  Continue home medications which include amlodipine 10 mg, hydralazine 100 mg p.o. 3 times daily, Toprol-XL 25 mg daily.  Continue labetalol as needed.  Paroxysmal atrial fibrillation: Rate fairly controlled.  Continue amiodarone, Toprol-XL and baby aspirin.  Goal of care discussion: Palliative care on board.  Patient DNR.  DVT prophylaxis: heparin injection 5,000 Units Start: 09/03/20 2315   Code Status: DNR  Family Communication: None present at bedside today.  Called his son Glendell Docker and left voicemail.  Status is: Inpatient  Remains inpatient appropriate because:Inpatient level of care appropriate due to severity of illness   Dispo: The patient is from: SNF  Anticipated d/c is to: SNF              Patient currently is not medically stable to d/c.     Difficult to place patient No        Estimated body mass index is 22.92 kg/m as calculated from the following:   Height as of this encounter: 6' 2.02" (1.88 m).   Weight as of this encounter: 81 kg.      Nutritional status:                Consultants:   IR  Procedures:   Left thoracentesis  Antimicrobials:  Anti-infectives (From admission, onward)   Start     Dose/Rate Route Frequency Ordered Stop   09/07/20 1000  azithromycin (ZITHROMAX) tablet 500 mg        500 mg Oral Daily 09/06/20 1156 09/07/20 0952   09/04/20 2200  cefTRIAXone (ROCEPHIN) 2 g in sodium chloride 0.9 % 100 mL IVPB        2 g 200 mL/hr over 30 Minutes Intravenous Every 24 hours 09/03/20 2312 09/09/20 2159   09/04/20 2200  azithromycin (ZITHROMAX) 500 mg in sodium chloride 0.9 % 250 mL IVPB  Status:  Discontinued        500 mg 250 mL/hr over 60 Minutes Intravenous Every 24 hours 09/03/20 2312 09/06/20 1156   09/04/20 1730  cefTRIAXone (ROCEPHIN) 1 g in sodium chloride 0.9 % 100 mL IVPB  Status:  Discontinued        1 g 200 mL/hr over 30 Minutes Intravenous Every 24 hours 09/04/20 1634 09/04/20 1643   09/04/20 1730  azithromycin (ZITHROMAX) 500 mg in sodium chloride 0.9 % 250 mL IVPB  Status:  Discontinued        500 mg 250 mL/hr over 60 Minutes Intravenous Every 24 hours 09/04/20 1634 09/04/20 1644   09/03/20 2230  cefTRIAXone (ROCEPHIN) 1 g in sodium chloride 0.9 % 100 mL IVPB        1 g 200 mL/hr over 30 Minutes Intravenous  Once 09/03/20 2229 09/03/20 2332   09/03/20 2230  azithromycin (ZITHROMAX) 500 mg in sodium chloride 0.9 % 250 mL IVPB        500 mg 250 mL/hr over 60 Minutes Intravenous  Once 09/03/20 2229 09/04/20 0148         Subjective: Patient seen and examined.  Slightly lethargic but comfortable.  Tried to talk but has severe dysarthria as usual and is a speech is incomprehensible.  Objective: Vitals:   09/09/20 0358 09/09/20 0745 09/09/20 1028 09/09/20 1206  BP: (!) 182/61 (!) 199/64 (!) 152/52 (!) 158/53  Pulse: 71 67 (!) 59 62  Resp: '20 20 18 20  '$ Temp: 98.7 F (37.1 C) 98.5 F (36.9 C) 98.3 F (36.8 C) 98.9 F (37.2 C)  TempSrc: Axillary Oral Oral Oral  SpO2: 95% 94% 95% 92%  Weight:      Height:         Intake/Output Summary (Last 24 hours) at 09/09/2020 1419 Last data filed at 09/09/2020 1029 Gross per 24 hour  Intake --  Output 1500 ml  Net -1500 ml   Filed Weights   09/04/20 0315  Weight: 81 kg    Examination: General exam: Appears calm and comfortable  Respiratory system: Diminished breath sounds left side. Respiratory effort normal. Cardiovascular system: S1 & S2 heard, RRR. No JVD, murmurs, rubs, gallops or clicks. No pedal edema. Gastrointestinal system: Abdomen is nondistended, soft and nontender. No organomegaly or  masses felt. Normal bowel sounds heard. Central nervous system: Slightly lethargic and disoriented.  No focal deficit Extremities: Symmetric 5 x 5 power. Skin: No rashes, lesions or ulcers.      Data Reviewed: I have personally reviewed following labs and imaging studies  CBC: Recent Labs  Lab 09/05/20 0118 09/06/20 0556 09/07/20 0847 09/08/20 0354 09/09/20 0302  WBC 3.5* 5.8 4.0 4.4 4.1  NEUTROABS 2.7 4.4 3.4 3.3 3.4  HGB 6.1* 8.6* 7.7* 8.2* 7.7*  HCT 21.2* 28.1* 25.4* 27.4* 26.9*  MCV 111.0* 105.2* 106.7* 106.2* 110.7*  PLT 192 216 196 190 [id number]   Basic Metabolic Panel: Recent Labs  Lab 09/05/20 0118 09/06/20 0556 09/07/20 0847 09/08/20 0354 09/09/20 0302  NA 143 143 144 146* 146*  K 5.9* 4.6 4.4 5.2* 4.8  CL 117* 113* 118* 119* 120*  CO2 19* 20* 21* 20* 19*  GLUCOSE 88 97 88 85 120*  BUN 51* 51* 42* 44* 42*  CREATININE 3.75* 3.68* 3.39* 3.18* 3.00*  CALCIUM 7.8* 8.2* 8.0* 8.2* 8.2*  MG  --  2.1  --   --   --    GFR: Estimated Creatinine Clearance: 17.3 mL/min (A) (by C-G formula based on SCr of 3 mg/dL (H)). Liver Function Tests: Recent Labs  Lab 09/03/20 2027 09/04/20 1249 09/05/20 0118  AST 25  --  20  ALT 21  --  15  ALKPHOS 86  --  61  BILITOT 0.6  --  0.2*  PROT 7.2 6.2* 5.1*  ALBUMIN 3.3* 2.7* 2.2*   No results for input(s): LIPASE, AMYLASE in the last 168 hours. No results for input(s): AMMONIA in the last 168  hours. Coagulation Profile: No results for input(s): INR, PROTIME in the last 168 hours. Cardiac Enzymes: No results for input(s): CKTOTAL, CKMB, CKMBINDEX, TROPONINI in the last 168 hours. BNP (last 3 results) No results for input(s): PROBNP in the last 8760 hours. HbA1C: No results for input(s): HGBA1C in the last 72 hours. CBG: Recent Labs  Lab 09/03/20 2340 09/05/20 1611  GLUCAP 47* 102*   Lipid Profile: No results for input(s): CHOL, HDL, LDLCALC, TRIG, CHOLHDL, LDLDIRECT in the last 72 hours. Thyroid Function Tests: No results for input(s): TSH, T4TOTAL, FREET4, T3FREE, THYROIDAB in the last 72 hours. Anemia Panel: No results for input(s): VITAMINB12, FOLATE, FERRITIN, TIBC, IRON, RETICCTPCT in the last 72 hours. Sepsis Labs: Recent Labs  Lab 09/03/20 2230 09/03/20 2308  PROCALCITON  --  <0.10  LATICACIDVEN 1.9  --     Recent Results (from the past 240 hour(s))  Resp Panel by RT-PCR (Flu A&B, Covid) Nasopharyngeal Swab     Status: None   Collection Time: 09/03/20  8:28 PM   Specimen: Nasopharyngeal Swab; Nasopharyngeal(NP) swabs in vial transport medium  Result Value Ref Range Status   SARS Coronavirus 2 by RT PCR NEGATIVE NEGATIVE Final    Comment: (NOTE) SARS-CoV-2 target nucleic acids are NOT DETECTED.  The SARS-CoV-2 RNA is generally detectable in upper respiratory specimens during the acute phase of infection. The lowest concentration of SARS-CoV-2 viral copies this assay can detect is 138 copies/mL. A negative result does not preclude SARS-Cov-2 infection and should not be used as the sole basis for treatment or other patient management decisions. A negative result may occur with  improper specimen collection/handling, submission of specimen other than nasopharyngeal swab, presence of viral mutation(s) within the areas targeted by this assay, and inadequate number of viral copies(<138 copies/mL). A negative result must be combined with clinical  observations, patient history, and epidemiological information. The expected result is Negative.  Fact Sheet for Patients:  EntrepreneurPulse.com.au  Fact Sheet for Healthcare Providers:  IncredibleEmployment.be  This test is no t yet approved or cleared by the [place] FDA and  has been authorized for detection and/or diagnosis of SARS-CoV-2 by FDA under an Emergency Use Authorization (EUA). This EUA will remain  in effect (meaning this test can be used) for the duration of the COVID-19 declaration under Section 564(b)(1) of the Act, 21 U.S.C.section 360bbb-3(b)(1), unless the authorization is terminated  or revoked sooner.       Influenza A by PCR NEGATIVE NEGATIVE Final   Influenza B by PCR NEGATIVE NEGATIVE Final    Comment: (NOTE) The Xpert Xpress SARS-CoV-2/FLU/RSV plus assay is intended as an aid in the diagnosis of influenza from Nasopharyngeal swab specimens and should not be used as a sole basis for treatment. Nasal washings and aspirates are unacceptable for Xpert Xpress SARS-CoV-2/FLU/RSV testing.  Fact Sheet for Patients: EntrepreneurPulse.com.au  Fact Sheet for Healthcare Providers: IncredibleEmployment.be  This test is not yet approved or cleared by the [place] FDA and has been authorized for detection and/or diagnosis of SARS-CoV-2 by FDA under an Emergency Use Authorization (EUA). This EUA will remain in effect (meaning this test can be used) for the duration of the COVID-19 declaration under Section 564(b)(1) of the Act, 21 U.S.C. section 360bbb-3(b)(1), unless the authorization is terminated or revoked.  Performed at [facility], [place] [address] [place], [place] [address]   Blood culture (routine x 2)     Status: None   Collection Time: 09/03/20 11:11 PM   Specimen: BLOOD RIGHT FOREARM  Result Value Ref Range Status   Specimen Description BLOOD RIGHT FOREARM   Final   Special Requests   Final    BOTTLES DRAWN AEROBIC AND ANAEROBIC Blood Culture adequate volume   Culture   Final    NO GROWTH 5 DAYS Performed at [facility], [place] [address], [place], [place] [address]    Report Status 09/08/2020 FINAL  Final  Blood culture (routine x 2)     Status: None   Collection Time: 09/03/20 11:23 PM   Specimen: BLOOD LEFT HAND  Result Value Ref Range Status   Specimen Description BLOOD LEFT HAND  Final   Special Requests   Final    BOTTLES DRAWN AEROBIC AND ANAEROBIC Blood Culture adequate volume   Culture   Final    NO GROWTH 5 DAYS Performed at [facility], [place] [address], [place], [place] [address]    Report Status 09/09/2020 FINAL  Final  Body fluid culture w Gram Stain     Status: None   Collection Time: 09/04/20 11:56 AM   Specimen: Lung, Left; Pleural Fluid  Result Value Ref Range Status   Specimen Description FLUID  Final   Special Requests LEFT THORACENTESIS  Final   Gram Stain NO WBC SEEN NO ORGANISMS SEEN   Final   Culture   Final    NO GROWTH 3 DAYS Performed at [facility] [address] [place], [place] [address]    Report Status 09/08/2020 FINAL  Final  SARS CORONAVIRUS 2 (TAT 6-24 HRS) Nasopharyngeal Nasopharyngeal Swab     Status: None   Collection Time: 09/08/20  9:38 AM   Specimen: Nasopharyngeal Swab  Result Value Ref Range Status   SARS Coronavirus 2 NEGATIVE NEGATIVE Final    Comment: (NOTE) SARS-CoV-2 target nucleic acids  are NOT DETECTED.  The SARS-CoV-2 RNA is generally detectable in upper and lower respiratory specimens during the acute phase of infection. Negative results do not preclude SARS-CoV-2 infection, do not rule out co-infections with other pathogens, and should not be used as the sole basis for treatment or other patient management decisions. Negative results must be combined with clinical observations, patient history, and epidemiological information. The expected result is  Negative.  Fact Sheet for Patients: SugarRoll.be  Fact Sheet for Healthcare Providers: https://www.woods-mathews.com/  This test is not yet approved or cleared by the [place] FDA and  has been authorized for detection and/or diagnosis of SARS-CoV-2 by FDA under an Emergency Use Authorization (EUA). This EUA will remain  in effect (meaning this test can be used) for the duration of the COVID-19 declaration under Se ction 564(b)(1) of the Act, 21 U.S.C. section 360bbb-3(b)(1), unless the authorization is terminated or revoked sooner.  Performed at [facility], [place] [address] [place],  [address]       Radiology Studies: DG CHEST PORT 1 VIEW  Result Date: 09/08/2020 CLINICAL DATA:  Shortness of breath. EXAM: PORTABLE CHEST 1 VIEW COMPARISON:  09/04/2020. FINDINGS: Patient rotated to the right. Cardiomegaly. Mild bilateral interstitial prominence. Large left pleural effusion, increased in size from prior exam. No pneumothorax. Prior cervicothoracic spine fusion. Degenerative change thoracic spine. IMPRESSION: 1. Cardiomegaly. Bilateral interstitial prominence. Interstitial edema could present this fashion. 2.  Large left pleural effusion, increased in size from prior exam. Electronically Signed   By: [place]   On: 09/08/2020 15:26   DG Swallowing Func-Speech Pathology  Result Date: 09/09/2020 Objective Swallowing Evaluation: Type of Study: MBS-Modified Barium Swallow Study  Patient Details Name: Graylan Ballerini MRN: [id number] Date of Birth: 11-12-1923 Today's Date: 09/09/2020 Time: SLP Start Time (ACUTE ONLY): 0945 -SLP Stop Time (ACUTE ONLY): 1000 SLP Time Calculation (min) (ACUTE ONLY): 15 min Past Medical History: Past Medical History: Diagnosis Date . BPH (benign prostatic hyperplasia)  . Chronic kidney disease   ckd stage 3 . Dementia ([place])  . GERD (gastroesophageal reflux disease)  . Hemorrhoids  . History of angina  .  Hyperlipemia  . Hypertension  . Irregular heart rhythm  . Paroxysmal A-fib ([place]) 04/02/2015 Past Surgical History: Past Surgical History: Procedure Laterality Date . BACK SURGERY   . CERVICAL SPINE SURGERY   . IR THORACENTESIS ASP PLEURAL SPACE W/IMG GUIDE  09/04/2020 . SPINE SURGERY   HPI: Pt is a 87 y.o. male with medical history significant of advanced dementia, CKD 4, HTN, PAF not on anticoagulation. Pt presented to the ED with c/o SOB and and poor oral intake over the past week. CT chest 4/14: Large left pleural effusion with collapse of the left lower lobe. [name] in December, 2019; pt demonstrated impulsivity and a dysphagia 2 diet with thin liquids was recommended at this time.  No data recorded Assessment / Plan / Recommendation CHL IP CLINICAL IMPRESSIONS 09/09/2020 Clinical Impression Pt presents with mild-moderate oropharygeal dysphagia, with probable impact of baseline cognitive deficits, c/b impaired mastication of mechanical soft solids and penetration/aspiration of liquids. Thin liquids via cup resulted in trace-moderate silent aspiration (PAS 8) and clinican was unable to elicit cued cough from pt. Nectar thick liquids via cup resulted in penetration during the swallow (PAS 3) and mild residue in pyriform sinuses which was cleared with subsequent independent swallows. Use of straw with nectar liquids only decreased bolus cohesion/manipulation and premature spillage of liquids and subsequent aspiration (  PAS 8) noted x1. Penetration/aspiration eliminated when nectar thick liquids provided via cup and administered in small amounts by clinician. Solids WFL, except for perseveration on mastication of mechanical soft solids requiring clincian to offer liquid wash and verbal cues to initiate swallow. Recommend dysphagia 1, NTL (no straw) diet with full supervision and assist for all meals to provide small sips/bites at slow rate and alternate liquids with solids. Will f/u for continued tolerance  and education. SLP Visit Diagnosis Dysphagia, oropharyngeal phase (R13.12) Attention and concentration deficit following -- Frontal lobe and executive function deficit following -- Impact on safety and function Moderate aspiration risk   CHL IP TREATMENT RECOMMENDATION 09/09/2020 Treatment Recommendations Therapy as outlined in treatment plan below   Prognosis 09/09/2020 Prognosis for Safe Diet Advancement Fair Barriers to Reach Goals Cognitive deficits;Severity of deficits Barriers/Prognosis Comment -- CHL IP DIET RECOMMENDATION 09/09/2020 SLP Diet Recommendations Dysphagia 1 (Puree) solids;Nectar thick liquid Liquid Administration via Cup;No straw Medication Administration Crushed with puree Compensations Minimize environmental distractions;Slow rate;Small sips/bites Postural Changes Remain semi-upright after after feeds/meals (Comment);Seated upright at 90 degrees   CHL IP OTHER RECOMMENDATIONS 09/09/2020 Recommended Consults -- Oral Care Recommendations Oral care BID;Staff/trained caregiver to provide oral care Other Recommendations Order thickener from pharmacy   CHL IP FOLLOW UP RECOMMENDATIONS 09/09/2020 Follow up Recommendations 24 hour supervision/assistance;Skilled Nursing facility   [facility] IP FREQUENCY AND DURATION 09/09/2020 Speech Therapy Frequency (ACUTE ONLY) min 2x/week Treatment Duration 2 weeks      CHL IP ORAL PHASE 09/09/2020 Oral Phase Impaired Oral - Pudding Teaspoon -- Oral - Pudding Cup -- Oral - Honey Teaspoon -- Oral - Honey Cup -- Oral - Nectar Teaspoon -- Oral - Nectar Cup -- Oral - Nectar Straw -- Oral - Thin Teaspoon NT Oral - Thin Cup WFL Oral - Thin Straw Premature spillage Oral - Puree WFL Oral - Mech Soft Other (Comment);Impaired mastication Oral - Regular NT Oral - Multi-Consistency NT Oral - Pill NT Oral Phase - Comment --  CHL IP PHARYNGEAL PHASE 09/09/2020 Pharyngeal Phase Impaired Pharyngeal- Pudding Teaspoon -- Pharyngeal -- Pharyngeal- Pudding Cup -- Pharyngeal -- Pharyngeal- Honey  Teaspoon -- Pharyngeal -- Pharyngeal- Honey Cup -- Pharyngeal -- Pharyngeal- Nectar Teaspoon NT Pharyngeal -- Pharyngeal- Nectar Cup Penetration/Aspiration during swallow;Delayed swallow initiation-pyriform sinuses;Pharyngeal residue - valleculae;Trace aspiration Pharyngeal Material enters airway, remains ABOVE vocal cords and not ejected out;Material enters airway, passes BELOW cords without attempt by patient to eject out (silent aspiration) Pharyngeal- Nectar Straw Pharyngeal residue - valleculae;Delayed swallow initiation-pyriform sinuses Pharyngeal Material enters airway, remains ABOVE vocal cords and not ejected out Pharyngeal- Thin Teaspoon NT Pharyngeal -- Pharyngeal- Thin Cup Delayed swallow initiation-pyriform sinuses;Trace aspiration;Moderate aspiration;Penetration/Aspiration during swallow Pharyngeal Material enters airway, passes BELOW cords without attempt by patient to eject out (silent aspiration);Material enters airway, passes BELOW cords and not ejected out despite cough attempt by patient Pharyngeal- Thin Straw NT Pharyngeal -- Pharyngeal- Puree WFL Pharyngeal -- Pharyngeal- Mechanical Soft WFL Pharyngeal -- Pharyngeal- Regular NT Pharyngeal -- Pharyngeal- Multi-consistency NT Pharyngeal -- Pharyngeal- Pill NT Pharyngeal -- Pharyngeal Comment --  CHL IP CERVICAL ESOPHAGEAL PHASE 09/09/2020 Cervical Esophageal Phase WFL Pudding Teaspoon -- Pudding Cup -- Honey Teaspoon -- Honey Cup -- Nectar Teaspoon -- Nectar Cup -- Nectar Straw -- Thin Teaspoon -- Thin Cup -- Thin Straw -- Puree -- Mechanical Soft -- Regular -- Multi-consistency -- Pill -- Cervical Esophageal Comment -- Ellwood Dense, MA, [facility] Office Number: [phone] Acie Fredrickson 09/09/2020, 12:42 PM  Scheduled Meds: . albuterol  4 puff Inhalation Once  . amiodarone  200 mg Oral Daily  . amLODipine  10 mg Oral Daily  . aspirin EC  81 mg Oral Daily  . cholecalciferol  1,000 Units Oral  Daily  . dextrose  1 ampule Intravenous Once  . ferrous sulfate  325 mg Oral Daily  . heparin  5,000 Units Subcutaneous Q8H  . hydrALAZINE  100 mg Oral TID  . insulin aspart  10 Units Intravenous Once  . ipratropium  2 puff Inhalation Once  . melatonin  3 mg Oral QHS  . metoprolol succinate  25 mg Oral Daily  . polyethylene glycol  17 g Oral Daily  . senna  1 tablet Oral Daily  . sodium bicarbonate  650 mg Oral BID  . sodium chloride  2 spray Each Nare Daily  . tamsulosin  0.4 mg Oral QHS   Continuous Infusions: . cefTRIAXone (ROCEPHIN)  IV 2 g (09/08/20 2026)  . sodium bicarbonate in 0.45 NS mL infusion       LOS: 6 days   Time spent: 29 minutes   Darliss Cheney, MD [facility]  09/09/2020, 2:19 PM   To contact the attending provider between 7A-7P or the covering provider during after hours 7P-7A, please log into the web site [email or web address].

## 2020-09-09 NOTE — Progress Notes (Signed)
Modified Barium Swallow Progress Note  Patient Details  Name: Wayne Fuller MRN: [id number] Date of Birth: September 08, 1923  Today's Date: 09/09/2020  Modified Barium Swallow completed.  Full report located under Chart Review in the Imaging Section.  Brief recommendations include the following:  Clinical Impression  Pt presents with mild-moderate oropharygeal dysphagia, with probable impact of baseline cognitive deficits, c/b impaired mastication of mechanical soft solids and penetration/aspiration of liquids. Thin liquids via cup resulted in trace-moderate silent aspiration (PAS 8) and clinican was unable to elicit cued cough from pt. Nectar thick liquids via cup resulted in penetration during the swallow (PAS 3) and mild residue in pyriform sinuses which was cleared with subsequent independent swallows. Use of straw with nectar liquids only decreased bolus cohesion/manipulation and premature spillage of liquids and subsequent aspiration (PAS 8) noted x1. Penetration/aspiration eliminated when nectar thick liquids provided via cup and administered in small amounts by clinician. Solids WFL, except for perseveration on mastication of mechanical soft solids requiring clincian to offer liquid wash and verbal cues to initiate swallow. Recommend dysphagia 1, NTL (no straw) diet with full supervision and assist for all meals to provide small sips/bites at slow rate and alternate liquids with solids. Will f/u for continued tolerance and education.   Swallow Evaluation Recommendations       SLP Diet Recommendations: Dysphagia 1 (Puree) solids;Nectar thick liquid (No straws)   Liquid Administration via: Cup;No straw   Medication Administration: Crushed with puree   Supervision: Full supervision/cueing for compensatory strategies   Compensations: Minimize environmental distractions;Slow rate;Small sips/bites   Postural Changes: Remain semi-upright after after feeds/meals (Comment);Seated upright at 90  degrees   Oral Care Recommendations: Oral care BID;Staff/trained caregiver to provide oral care   Other Recommendations: Order thickener from [place], [place], [place] Office Number: [place] 09/09/2020,12:17 PM

## 2020-09-09 NOTE — Progress Notes (Signed)
Palliative:  HPI: [id number] y.o.malewith past medical history of dementia, hypertension, atrial fibrillation not on any anticoagulation, CKD stage 4admitted on 4/14/2022with breathing problems diagnosed with possible pneumonia and large left sided pleural effusion s/p thoracentesis 1500 cc removed.Also with worsening renal function now improving. S/P L thoracentesis 1700 cc 09/09/20.   I met today at Mr. Wayne Fuller's bedside. Still able to eat and mentation still at baseline due to baseline advanced dementia. No family at bedside.   I called and spoke with son, Glendell Docker. I discussed that renal function continues to improve. I explained MBSS completed with recommendations for dysphagia 1, nectar thick liquids and goals of diet to prevent aspiration as much as possible but this will always be a risk for his father and fluctuates with how alert and how well he does day by day. I discussed concern for recurrent pleural effusion with thoracentesis removing 1.7L. I wonder if renal failure and malnutrition is the culprit of pleural effusions. This will need to be monitored and further work up determined will be determined by Dr. Doristine Bosworth, Glendell Docker is appreciative of updates. He is hopeful for stabilization and return to memory care ALF ultimately.   All questions/concerns addressed. Emotional support provided.   Exam: Alert, confused. Mumbles and unable to understand him. No distress. Breathing regular, unlabored. Abd soft, flat. Moves all extremities. He does have new tremor/tick movement of upper body.   Plan: - Hopeful for improvement and return to memory care ALF.  - Recommend outpatient palliative care referral. Family not yet ready for hospice.   25 min  Vinie Sill, NP Palliative Medicine Team Pager [phone] (Please see [email or web address] for schedule) Team Phone [phone]    Greater than 50%  of this time was spent counseling and coordinating care related to the above assessment and plan

## 2020-09-09 NOTE — Progress Notes (Signed)
Physician paged. Patient seems to be developing a new onset of tardive dyskinesia. Will continue to monitor.

## 2020-09-10 ENCOUNTER — Inpatient Hospital Stay (HOSPITAL_COMMUNITY): Payer: Medicare Other

## 2020-09-10 DIAGNOSIS — N179 Acute kidney failure, unspecified: Secondary | ICD-10-CM | POA: Diagnosis not present

## 2020-09-10 LAB — CBC WITH DIFFERENTIAL/PLATELET
Abs Immature Granulocytes: 0.02 10*3/uL (ref 0.00–0.07)
Basophils Absolute: 0 10*3/uL (ref 0.0–0.1)
Basophils Relative: 0 %
Eosinophils Absolute: 0 10*3/uL (ref 0.0–0.5)
Eosinophils Relative: 1 %
HCT: 25.3 % — ABNORMAL LOW (ref 39.0–52.0)
Hemoglobin: 7.5 g/dL — ABNORMAL LOW (ref 13.0–17.0)
Immature Granulocytes: 1 %
Lymphocytes Relative: 13 %
Lymphs Abs: 0.5 10*3/uL — ABNORMAL LOW (ref 0.7–4.0)
MCH: 32.1 pg (ref 26.0–34.0)
MCHC: 29.6 g/dL — ABNORMAL LOW (ref 30.0–36.0)
MCV: 108.1 fL — ABNORMAL HIGH (ref 80.0–100.0)
Monocytes Absolute: 0.2 10*3/uL (ref 0.1–1.0)
Monocytes Relative: 7 %
Neutro Abs: 2.7 10*3/uL (ref 1.7–7.7)
Neutrophils Relative %: 78 %
Platelets: 187 10*3/uL (ref 150–400)
RBC: 2.34 MIL/uL — ABNORMAL LOW (ref 4.22–5.81)
RDW: 16.4 % — ABNORMAL HIGH (ref 11.5–15.5)
WBC: 3.5 10*3/uL — ABNORMAL LOW (ref 4.0–10.5)
nRBC: 0 % (ref 0.0–0.2)

## 2020-09-10 LAB — BASIC METABOLIC PANEL
Anion gap: 5 (ref 5–15)
BUN: 38 mg/dL — ABNORMAL HIGH (ref 8–23)
CO2: 19 mmol/L — ABNORMAL LOW (ref 22–32)
Calcium: 8.1 mg/dL — ABNORMAL LOW (ref 8.9–10.3)
Chloride: 123 mmol/L — ABNORMAL HIGH (ref 98–111)
Creatinine, Ser: 2.97 mg/dL — ABNORMAL HIGH (ref 0.61–1.24)
GFR, Estimated: 19 mL/min — ABNORMAL LOW (ref >60)
Glucose, Bld: 88 mg/dL (ref 70–99)
Potassium: 4.8 mmol/L (ref 3.5–5.1)
Sodium: 147 mmol/L — ABNORMAL HIGH (ref 135–145)

## 2020-09-10 NOTE — Progress Notes (Signed)
PROGRESS NOTE    Wayne Fuller  [id number] DOB: 08/03/1923 DOA: 09/03/2020 PCP: [name], NP   Brief Narrative:  Qui Sit is a 87 y.o. male with medical history significant of advanced dementia, CKD 4, HTN, PAF not on anticoagulation was brought to the ED with c/o SOB ongoing for past few days.  Associated generalized weakness, poor PO intake over past week or so.  Had 2 days of N/V/D due to viral illness that was going around the SNF between 1-2 weeks ago according to daughter. No reported fevers, fall/trauma.  Upon arrival to ED, he was hypoxic requiring oxygen and also had AKI on CKD stage IV with hyperkalemia.  COVID neg. he did have peaked T waves on EKG.  There was some concern about left lower lobe pneumonia on the chest x-ray for which he was started on Rocephin and Zithromax.  CT chest showed large left-sided pleural effusion.  He was admitted to hospital service.  IR was consulted and he underwent left thoracentesis with 1500 cc of urine.  Fluid analysis indicated transudative fluid.  Patient's oxygen demand improved.  He was then started on gentle IV hydration as his p.o. intake was not enough.  With that, his renal function started to improve as well.  He also developed acute on chronic anemia where his hemoglobin dropped to 6.1 on 09/05/2020 for which she received 1 unit of PRBC suspicion and since then his hemoglobin remained stable over 7.5.  Repeat chest x-ray was done on 09/08/2020 which once again showed large left-sided pleural effusion.  Another thoracentesis was done on 09/09/2020 with removal of 1.7 L of bloody fluid.  Palliative care has been following patient.  Assessment & Plan:   Principal Problem:   Acute kidney failure ([place]) Active Problems:   Essential hypertension   CKD (chronic kidney disease) stage 4, GFR 15-29 ml/min (HCC)   Dementia with behavioral disturbance (HCC)   Hyperkalemia   Pleural effusion on left  Acute hypoxic respiratory failure due  to large left-sided pleural effusion and possible left lower lobe community-acquired pneumonia: Status post diagnostic and therapeutic thoracentesis 09/04/2020 with a yield of 1500 cc fluid.  Fluid analysis indicate transudative fluid.  Patient's respiratory status has improved.  Patient has remained on room air and looks comfortable for last couple of days however repeat chest x-ray on 09/08/2020 showed accumulation of large left-sided pleural effusion.  Patient underwent another therapeutic thoracentesis on 09/09/2020 with removal of 1.7 L bloody fluid.  Chest x-ray again today shows slightly worsened pleural effusion.  I will repeat chest x-ray tomorrow morning.  If further accumulation of pleural effusion, will need another thoracentesis however at that point in time, would recommend palliative care having frank conversation about hospice with the family.  I will to stop bicarb drip.  I was able to talk to his son Glendell Docker and gave him a heads up about possibly having more discussion about hospice.  He will be here tomorrow, he said.  AKI on CKD stage IV/Mild metabolic acidosis: Baseline creatinine around 2.5.  Presented with 4.06.  Improved to 3.0 yesterday and has remained stable.  CO2 down again today but I was informed that patient's bicarb drip was only started at 1 AM this morning while order was placed yesterday before noon.  Continue p.o. bicarb replacement.  Stop IV bicarb replacement.  Hyperkalemia: Resolved  Acute on chronic anemia: He has anemia of chronic disease due to CKD.  His hemoglobin dropped to 6.1 this morning.  Nocturnist  ordered 1 unit of PRBC transfusion which is completed.  Hemoglobin improved to 8.6 and now down to 7.5.  Advanced dementia/Wheelchair-bound at baseline: Patient has pretty advanced dementia.  Continue quetiapine to prevent delirium.  Essential hypertension: Blood pressure fairly controlled.  Continue home medications which include amlodipine 10 mg, hydralazine 100 mg  p.o. 3 times daily, Toprol-XL 25 mg daily.  Continue labetalol as needed.  Paroxysmal atrial fibrillation: Rate fairly controlled.  Continue amiodarone, Toprol-XL and baby aspirin.  Goal of care discussion: Palliative care on board.  Patient DNR.  Dysphagia: SLP on board.  He is on dysphagia 1 diet  DVT prophylaxis: heparin injection 5,000 Units Start: 09/03/20 2315   Code Status: DNR  Family Communication: None present at bedside today.  Discussed with his son Glendell Docker over the phone  Status is: Inpatient  Remains inpatient appropriate because:Inpatient level of care appropriate due to severity of illness   Dispo: The patient is from: SNF              Anticipated d/c is to: SNF              Patient currently is not medically stable to d/c.     Difficult to place patient No        Estimated body mass index is 22.92 kg/m as calculated from the following:   Height as of this encounter: 6' 2.02" (1.88 m).   Weight as of this encounter: 81 kg.      Nutritional status:               Consultants:   IR  Procedures:   Left thoracentesis  Antimicrobials:  Anti-infectives (From admission, onward)   Start     Dose/Rate Route Frequency Ordered Stop   09/07/20 1000  azithromycin (ZITHROMAX) tablet 500 mg        500 mg Oral Daily 09/06/20 1156 09/07/20 0952   09/04/20 2200  cefTRIAXone (ROCEPHIN) 2 g in sodium chloride 0.9 % 100 mL IVPB        2 g 200 mL/hr over 30 Minutes Intravenous Every 24 hours 09/03/20 2312 09/09/20 2159   09/04/20 2200  azithromycin (ZITHROMAX) 500 mg in sodium chloride 0.9 % 250 mL IVPB  Status:  Discontinued        500 mg 250 mL/hr over 60 Minutes Intravenous Every 24 hours 09/03/20 2312 09/06/20 1156   09/04/20 1730  cefTRIAXone (ROCEPHIN) 1 g in sodium chloride 0.9 % 100 mL IVPB  Status:  Discontinued        1 g 200 mL/hr over 30 Minutes Intravenous Every 24 hours 09/04/20 1634 09/04/20 1643   09/04/20 1730  azithromycin (ZITHROMAX) 500  mg in sodium chloride 0.9 % 250 mL IVPB  Status:  Discontinued        500 mg 250 mL/hr over 60 Minutes Intravenous Every 24 hours 09/04/20 1634 09/04/20 1644   09/03/20 2230  cefTRIAXone (ROCEPHIN) 1 g in sodium chloride 0.9 % 100 mL IVPB        1 g 200 mL/hr over 30 Minutes Intravenous  Once 09/03/20 2229 09/03/20 2332   09/03/20 2230  azithromycin (ZITHROMAX) 500 mg in sodium chloride 0.9 % 250 mL IVPB        500 mg 250 mL/hr over 60 Minutes Intravenous  Once 09/03/20 2229 09/04/20 0148         Subjective: Seen and examined.  Patient alert but not oriented.  Severe dysarthria.  Looks comfortable.  On room air.  Objective: Vitals:   09/09/20 2247 09/10/20 0314 09/10/20 0729 09/10/20 0800  BP: (!) 147/57 (!) 174/55 (!) 175/52   Pulse: 74 72 70   Resp: '18 20 20 20  '$ Temp: 98.3 F (36.8 C) 98.9 F (37.2 C) 97.8 F (36.6 C)   TempSrc: Oral Oral Axillary   SpO2: 95% 98% 96%   Weight:      Height:        Intake/Output Summary (Last 24 hours) at 09/10/2020 1054 Last data filed at 09/10/2020 0500 Gross per 24 hour  Intake --  Output 1300 ml  Net -1300 ml   Filed Weights   09/04/20 0315  Weight: 81 kg    Examination: General exam: Appears calm and comfortable  Respiratory system: Diminished breath sounds left middle and lower lobe. Respiratory effort normal. Cardiovascular system: S1 & S2 heard, RRR. No JVD, murmurs, rubs, gallops or clicks. No pedal edema. Gastrointestinal system: Abdomen is nondistended, soft and nontender. No organomegaly or masses felt. Normal bowel sounds heard. Central nervous system: Alert but not oriented.  No focal deficit except dysarthria. Extremities: Symmetric 5 x 5 power. Skin: No rashes, lesions or ulcers.    Data Reviewed: I have personally reviewed following labs and imaging studies  CBC: Recent Labs  Lab 09/06/20 0556 09/07/20 0847 09/08/20 0354 09/09/20 0302 09/10/20 0200  WBC 5.8 4.0 4.4 4.1 3.5*  NEUTROABS 4.4 3.4 3.3 3.4  2.7  HGB 8.6* 7.7* 8.2* 7.7* 7.5*  HCT 28.1* 25.4* 27.4* 26.9* 25.3*  MCV 105.2* 106.7* 106.2* 110.7* 108.1*  PLT 216 196 190 194 [id number]   Basic Metabolic Panel: Recent Labs  Lab 09/06/20 0556 09/07/20 0847 09/08/20 0354 09/09/20 0302 09/10/20 0200  NA 143 144 146* 146* 147*  K 4.6 4.4 5.2* 4.8 4.8  CL 113* 118* 119* 120* 123*  CO2 20* 21* 20* 19* 19*  GLUCOSE 97 88 85 120* 88  BUN 51* 42* 44* 42* 38*  CREATININE 3.68* 3.39* 3.18* 3.00* 2.97*  CALCIUM 8.2* 8.0* 8.2* 8.2* 8.1*  MG 2.1  --   --   --   --    GFR: Estimated Creatinine Clearance: 17.4 mL/min (A) (by C-G formula based on SCr of 2.97 mg/dL (H)). Liver Function Tests: Recent Labs  Lab 09/03/20 2027 09/04/20 1249 09/05/20 0118  AST 25  --  20  ALT 21  --  15  ALKPHOS 86  --  61  BILITOT 0.6  --  0.2*  PROT 7.2 6.2* 5.1*  ALBUMIN 3.3* 2.7* 2.2*   No results for input(s): LIPASE, AMYLASE in the last 168 hours. No results for input(s): AMMONIA in the last 168 hours. Coagulation Profile: No results for input(s): INR, PROTIME in the last 168 hours. Cardiac Enzymes: No results for input(s): CKTOTAL, CKMB, CKMBINDEX, TROPONINI in the last 168 hours. BNP (last 3 results) No results for input(s): PROBNP in the last 8760 hours. HbA1C: No results for input(s): HGBA1C in the last 72 hours. CBG: Recent Labs  Lab 09/03/20 2340 09/05/20 1611  GLUCAP 47* 102*   Lipid Profile: No results for input(s): CHOL, HDL, LDLCALC, TRIG, CHOLHDL, LDLDIRECT in the last 72 hours. Thyroid Function Tests: No results for input(s): TSH, T4TOTAL, FREET4, T3FREE, THYROIDAB in the last 72 hours. Anemia Panel: No results for input(s): VITAMINB12, FOLATE, FERRITIN, TIBC, IRON, RETICCTPCT in the last 72 hours. Sepsis Labs: Recent Labs  Lab 09/03/20 2230 09/03/20 2308  PROCALCITON  --  <0.10  LATICACIDVEN 1.9  --     Recent  Results (from the past 240 hour(s))  Resp Panel by RT-PCR (Flu A&B, Covid) Nasopharyngeal Swab     Status:  None   Collection Time: 09/03/20  8:28 PM   Specimen: Nasopharyngeal Swab; Nasopharyngeal(NP) swabs in vial transport medium  Result Value Ref Range Status   SARS Coronavirus 2 by RT PCR NEGATIVE NEGATIVE Final    Comment: (NOTE) SARS-CoV-2 target nucleic acids are NOT DETECTED.  The SARS-CoV-2 RNA is generally detectable in upper respiratory specimens during the acute phase of infection. The lowest concentration of SARS-CoV-2 viral copies this assay can detect is 138 copies/mL. A negative result does not preclude SARS-Cov-2 infection and should not be used as the sole basis for treatment or other patient management decisions. A negative result may occur with  improper specimen collection/handling, submission of specimen other than nasopharyngeal swab, presence of viral mutation(s) within the areas targeted by this assay, and inadequate number of viral copies(<138 copies/mL). A negative result must be combined with clinical observations, patient history, and epidemiological information. The expected result is Negative.  Fact Sheet for Patients:  EntrepreneurPulse.com.au  Fact Sheet for Healthcare Providers:  IncredibleEmployment.be  This test is no t yet approved or cleared by the [place] FDA and  has been authorized for detection and/or diagnosis of SARS-CoV-2 by FDA under an Emergency Use Authorization (EUA). This EUA will remain  in effect (meaning this test can be used) for the duration of the COVID-19 declaration under Section 564(b)(1) of the Act, 21 U.S.C.section 360bbb-3(b)(1), unless the authorization is terminated  or revoked sooner.       Influenza A by PCR NEGATIVE NEGATIVE Final   Influenza B by PCR NEGATIVE NEGATIVE Final    Comment: (NOTE) The Xpert Xpress SARS-CoV-2/FLU/RSV plus assay is intended as an aid in the diagnosis of influenza from Nasopharyngeal swab specimens and should not be used as a sole basis for  treatment. Nasal washings and aspirates are unacceptable for Xpert Xpress SARS-CoV-2/FLU/RSV testing.  Fact Sheet for Patients: EntrepreneurPulse.com.au  Fact Sheet for Healthcare Providers: IncredibleEmployment.be  This test is not yet approved or cleared by the [place] FDA and has been authorized for detection and/or diagnosis of SARS-CoV-2 by FDA under an Emergency Use Authorization (EUA). This EUA will remain in effect (meaning this test can be used) for the duration of the COVID-19 declaration under Section 564(b)(1) of the Act, 21 U.S.C. section 360bbb-3(b)(1), unless the authorization is terminated or revoked.  Performed at [facility], [place] [address], [place], [place] [address]   Blood culture (routine x 2)     Status: None   Collection Time: 09/03/20 11:11 PM   Specimen: BLOOD RIGHT FOREARM  Result Value Ref Range Status   Specimen Description BLOOD RIGHT FOREARM  Final   Special Requests   Final    BOTTLES DRAWN AEROBIC AND ANAEROBIC Blood Culture adequate volume   Culture   Final    NO GROWTH 5 DAYS Performed at [facility], [place] [address] [place], [place] [address]    Report Status 09/08/2020 FINAL  Final  Blood culture (routine x 2)     Status: None   Collection Time: 09/03/20 11:23 PM   Specimen: BLOOD LEFT HAND  Result Value Ref Range Status   Specimen Description BLOOD LEFT HAND  Final   Special Requests   Final    BOTTLES DRAWN AEROBIC AND ANAEROBIC Blood Culture adequate volume   Culture   Final    NO GROWTH 5 DAYS Performed  at [facility], [place] [address] [place], [place] [address]    Report Status 09/09/2020 FINAL  Final  Body fluid culture w Gram Stain     Status: None   Collection Time: 09/04/20 11:56 AM   Specimen: Lung, Left; Pleural Fluid  Result Value Ref Range Status   Specimen Description FLUID  Final   Special Requests LEFT THORACENTESIS  Final   Gram Stain NO WBC SEEN NO  ORGANISMS SEEN   Final   Culture   Final    NO GROWTH 3 DAYS Performed at [place] [address], [place], [place] [address]    Report Status 09/08/2020 FINAL  Final  SARS CORONAVIRUS 2 (TAT 6-24 HRS) Nasopharyngeal Nasopharyngeal Swab     Status: None   Collection Time: 09/08/20  9:38 AM   Specimen: Nasopharyngeal Swab  Result Value Ref Range Status   SARS Coronavirus 2 NEGATIVE NEGATIVE Final    Comment: (NOTE) SARS-CoV-2 target nucleic acids are NOT DETECTED.  The SARS-CoV-2 RNA is generally detectable in upper and lower respiratory specimens during the acute phase of infection. Negative results do not preclude SARS-CoV-2 infection, do not rule out co-infections with other pathogens, and should not be used as the sole basis for treatment or other patient management decisions. Negative results must be combined with clinical observations, patient history, and epidemiological information. The expected result is Negative.  Fact Sheet for Patients: SugarRoll.be  Fact Sheet for Healthcare Providers: https://www.woods-mathews.com/  This test is not yet approved or cleared by the [place] FDA and  has been authorized for detection and/or diagnosis of SARS-CoV-2 by FDA under an Emergency Use Authorization (EUA). This EUA will remain  in effect (meaning this test can be used) for the duration of the COVID-19 declaration under Se ction 564(b)(1) of the Act, 21 U.S.C. section 360bbb-3(b)(1), unless the authorization is terminated or revoked sooner.  Performed at [facility], [place] [address], [place], [place] [address]       Radiology Studies: DG Chest 1 View  Result Date: 09/09/2020 CLINICAL DATA:  Post left thoracentesis EXAM: CHEST  1 VIEW COMPARISON:  09/08/2020 FINDINGS: Significant improvement in left pleural effusion. Improvement in left lower lobe atelectasis. No pneumothorax Right lung remains clear without  effusion on the right. IMPRESSION: No complication post left thoracentesis. Electronically Signed   By: Franchot Gallo M.D.   On: 09/09/2020 16:41   DG CHEST PORT 1 VIEW  Result Date: 09/10/2020 CLINICAL DATA:  Pleural effusion.  Confusion. EXAM: PORTABLE CHEST 1 VIEW COMPARISON:  Yesterday FINDINGS: Increased versus preferential inferior accumulation of the left pleural effusion. Underlying left pulmonary opacity persists. Clear right lung. Stable heart size. IMPRESSION: Increased versus re-distributed left pleural effusion which is at least moderate volume. Electronically Signed   By: Monte Fantasia M.D.   On: 09/10/2020 07:53   DG CHEST PORT 1 VIEW  Result Date: 09/08/2020 CLINICAL DATA:  Shortness of breath. EXAM: PORTABLE CHEST 1 VIEW COMPARISON:  09/04/2020. FINDINGS: Patient rotated to the right. Cardiomegaly. Mild bilateral interstitial prominence. Large left pleural effusion, increased in size from prior exam. No pneumothorax. Prior cervicothoracic spine fusion. Degenerative change thoracic spine. IMPRESSION: 1. Cardiomegaly. Bilateral interstitial prominence. Interstitial edema could present this fashion. 2.  Large left pleural effusion, increased in size from prior exam. Electronically Signed   By: [place]   On: 09/08/2020 15:26   DG Swallowing Func-Speech Pathology  Result Date: 09/09/2020 Objective Swallowing Evaluation: Type of Study: MBS-Modified  Barium Swallow Study  Patient Details Name: Ly Stencil MRN: [id number] Date of Birth: 22-Nov-1923 Today's Date: 09/09/2020 Time: SLP Start Time (ACUTE ONLY): 0945 -SLP Stop Time (ACUTE ONLY): 1000 SLP Time Calculation (min) (ACUTE ONLY): 15 min Past Medical History: Past Medical History: Diagnosis Date . BPH (benign prostatic hyperplasia)  . Chronic kidney disease   ckd stage 3 . Dementia ([place])  . GERD (gastroesophageal reflux disease)  . Hemorrhoids  . History of angina  . Hyperlipemia  . Hypertension  . Irregular heart rhythm  .  Paroxysmal A-fib ([place]) 04/02/2015 Past Surgical History: Past Surgical History: Procedure Laterality Date . BACK SURGERY   . CERVICAL SPINE SURGERY   . IR THORACENTESIS ASP PLEURAL SPACE W/IMG GUIDE  09/04/2020 . SPINE SURGERY   HPI: Pt is a 87 y.o. male with medical history significant of advanced dementia, CKD 4, HTN, PAF not on anticoagulation. Pt presented to the ED with c/o SOB and and poor oral intake over the past week. CT chest 4/14: Large left pleural effusion with collapse of the left lower lobe. [name] in December, 2019; pt demonstrated impulsivity and a dysphagia 2 diet with thin liquids was recommended at this time.  No data recorded Assessment / Plan / Recommendation CHL IP CLINICAL IMPRESSIONS 09/09/2020 Clinical Impression Pt presents with mild-moderate oropharygeal dysphagia, with probable impact of baseline cognitive deficits, c/b impaired mastication of mechanical soft solids and penetration/aspiration of liquids. Thin liquids via cup resulted in trace-moderate silent aspiration (PAS 8) and clinican was unable to elicit cued cough from pt. Nectar thick liquids via cup resulted in penetration during the swallow (PAS 3) and mild residue in pyriform sinuses which was cleared with subsequent independent swallows. Use of straw with nectar liquids only decreased bolus cohesion/manipulation and premature spillage of liquids and subsequent aspiration (PAS 8) noted x1. Penetration/aspiration eliminated when nectar thick liquids provided via cup and administered in small amounts by clinician. Solids WFL, except for perseveration on mastication of mechanical soft solids requiring clincian to offer liquid wash and verbal cues to initiate swallow. Recommend dysphagia 1, NTL (no straw) diet with full supervision and assist for all meals to provide small sips/bites at slow rate and alternate liquids with solids. Will f/u for continued tolerance and education. SLP Visit Diagnosis Dysphagia,  oropharyngeal phase (R13.12) Attention and concentration deficit following -- Frontal lobe and executive function deficit following -- Impact on safety and function Moderate aspiration risk   CHL IP TREATMENT RECOMMENDATION 09/09/2020 Treatment Recommendations Therapy as outlined in treatment plan below   Prognosis 09/09/2020 Prognosis for Safe Diet Advancement Fair Barriers to Reach Goals Cognitive deficits;Severity of deficits Barriers/Prognosis Comment -- CHL IP DIET RECOMMENDATION 09/09/2020 SLP Diet Recommendations Dysphagia 1 (Puree) solids;Nectar thick liquid Liquid Administration via Cup;No straw Medication Administration Crushed with puree Compensations Minimize environmental distractions;Slow rate;Small sips/bites Postural Changes Remain semi-upright after after feeds/meals (Comment);Seated upright at 90 degrees   CHL IP OTHER RECOMMENDATIONS 09/09/2020 Recommended Consults -- Oral Care Recommendations Oral care BID;Staff/trained caregiver to provide oral care Other Recommendations Order thickener from pharmacy   CHL IP FOLLOW UP RECOMMENDATIONS 09/09/2020 Follow up Recommendations 24 hour supervision/assistance;Skilled Nursing facility   [facility] IP FREQUENCY AND DURATION 09/09/2020 Speech Therapy Frequency (ACUTE ONLY) min 2x/week Treatment Duration 2 weeks      CHL IP ORAL PHASE 09/09/2020 Oral Phase Impaired Oral - Pudding Teaspoon -- Oral - Pudding Cup -- Oral - Honey Teaspoon -- Oral - Honey Cup -- Oral - Nectar Teaspoon -- Oral -  Nectar Cup -- Oral - Nectar Straw -- Oral - Thin Teaspoon NT Oral - Thin Cup WFL Oral - Thin Straw Premature spillage Oral - Puree WFL Oral - Mech Soft Other (Comment);Impaired mastication Oral - Regular NT Oral - Multi-Consistency NT Oral - Pill NT Oral Phase - Comment --  CHL IP PHARYNGEAL PHASE 09/09/2020 Pharyngeal Phase Impaired Pharyngeal- Pudding Teaspoon -- Pharyngeal -- Pharyngeal- Pudding Cup -- Pharyngeal -- Pharyngeal- Honey Teaspoon -- Pharyngeal -- Pharyngeal- Honey Cup --  Pharyngeal -- Pharyngeal- Nectar Teaspoon NT Pharyngeal -- Pharyngeal- Nectar Cup Penetration/Aspiration during swallow;Delayed swallow initiation-pyriform sinuses;Pharyngeal residue - valleculae;Trace aspiration Pharyngeal Material enters airway, remains ABOVE vocal cords and not ejected out;Material enters airway, passes BELOW cords without attempt by patient to eject out (silent aspiration) Pharyngeal- Nectar Straw Pharyngeal residue - valleculae;Delayed swallow initiation-pyriform sinuses Pharyngeal Material enters airway, remains ABOVE vocal cords and not ejected out Pharyngeal- Thin Teaspoon NT Pharyngeal -- Pharyngeal- Thin Cup Delayed swallow initiation-pyriform sinuses;Trace aspiration;Moderate aspiration;Penetration/Aspiration during swallow Pharyngeal Material enters airway, passes BELOW cords without attempt by patient to eject out (silent aspiration);Material enters airway, passes BELOW cords and not ejected out despite cough attempt by patient Pharyngeal- Thin Straw NT Pharyngeal -- Pharyngeal- Puree WFL Pharyngeal -- Pharyngeal- Mechanical Soft WFL Pharyngeal -- Pharyngeal- Regular NT Pharyngeal -- Pharyngeal- Multi-consistency NT Pharyngeal -- Pharyngeal- Pill NT Pharyngeal -- Pharyngeal Comment --  CHL IP CERVICAL ESOPHAGEAL PHASE 09/09/2020 Cervical Esophageal Phase WFL Pudding Teaspoon -- Pudding Cup -- Honey Teaspoon -- Honey Cup -- Nectar Teaspoon -- Nectar Cup -- Nectar Straw -- Thin Teaspoon -- Thin Cup -- Thin Straw -- Puree -- Mechanical Soft -- Regular -- Multi-consistency -- Pill -- Cervical Esophageal Comment -- Ellwood Dense, MA, CCC-SLP Acute Rehabilitation Services Office Number: [phone] Acie Fredrickson 09/09/2020, 12:42 PM              IR THORACENTESIS ASP PLEURAL SPACE W/IMG GUIDE  Result Date: 09/09/2020 INDICATION: Acute hypoxic respiratory failure due to large left-sided pleural effusion and possible left lower lobe community-acquired pneumonia. Request for therapeutic  thoracentesis. EXAM: ULTRASOUND GUIDED LEFT THORACENTESIS MEDICATIONS: 1% lidocaine 10 mL COMPLICATIONS: None immediate. PROCEDURE: An ultrasound guided thoracentesis was thoroughly discussed with the patient and questions answered. The benefits, risks, alternatives and complications were also discussed. The patient understands and wishes to proceed with the procedure. Written consent was obtained. Ultrasound was performed to localize and mark an adequate pocket of fluid in the left chest. The area was then prepped and draped in the normal sterile fashion. 1% Lidocaine was used for local anesthesia. Under ultrasound guidance a 6 Fr Safe-T-Centesis catheter was introduced. Thoracentesis was performed. The catheter was removed and a dressing applied. FINDINGS: A total of approximately 1.7 L of bloody fluid was removed. Procedure stopped due to patient coughing. IMPRESSION: Successful ultrasound guided left thoracentesis yielding 1.7 L of pleural fluid. Read by: Gareth Eagle, PA-C Electronically Signed   By: Miachel Roux M.D.   On: 09/09/2020 16:22    Scheduled Meds: . albuterol  4 puff Inhalation Once  . amiodarone  200 mg Oral Daily  . amLODipine  10 mg Oral Daily  . aspirin EC  81 mg Oral Daily  . cholecalciferol  1,000 Units Oral Daily  . dextrose  1 ampule Intravenous Once  . ferrous sulfate  325 mg Oral Daily  . heparin  5,000 Units Subcutaneous Q8H  . hydrALAZINE  100 mg Oral TID  . insulin aspart  10 Units Intravenous Once  . ipratropium  2 puff Inhalation Once  . melatonin  3 mg Oral QHS  . metoprolol succinate  25 mg Oral Daily  . polyethylene glycol  17 g Oral Daily  . senna  1 tablet Oral Daily  . sodium bicarbonate  650 mg Oral BID  . sodium chloride  2 spray Each Nare Daily  . tamsulosin  0.4 mg Oral QHS   Continuous Infusions: . sodium bicarbonate in 0.45 NS mL infusion 25 mL/hr at 09/10/20 0442     LOS: 7 days   Time spent: 31 minutes   Darliss Cheney, MD [facility]  09/10/2020, 10:54 AM   To contact the attending provider between 7A-7P or the covering provider during after hours 7P-7A, please log into the web site [email or web address].

## 2020-09-10 NOTE — Progress Notes (Signed)
  Speech Language Pathology Treatment: Dysphagia  Patient Details Name: Wayne Fuller MRN: [id number] DOB: 1924-02-23 Today's Date: 09/10/2020 Time: [id number] SLP Time Calculation (min) (ACUTE ONLY): 12 min  Assessment / Plan / Recommendation Clinical Impression  Diet tolerance targeted this date with pt very inattentive, engaging in constant verbalizing as noted in past sessions. He required max verbal cues to attend, which was sustained long enough for swallowing of small bites of puree and small sips of NTL via cup. Mild wet vocal quality, audible swallows, throat clearing and coughing x2 noted during trials and pt unable to follow commands to elicit cough given cognitive deficits. Pt to continue on current diet and educated nursing staff regarding basic swallow precautions (upright positioning, small bites/sips, no straw and cueing pt to attend to POs). Also educated regarding results of MBS  (09/09/20) and that pt is at risk for silent aspiration. All agreed to recommendations and expressed understanding of education.    HPI HPI: Pt is a 87 y.o. male with medical history significant of advanced dementia, CKD 4, HTN, PAF not on anticoagulation. Pt presented to the ED with c/o SOB and and poor oral intake over the past week. CT chest 4/14: Large left pleural effusion with collapse of the left lower lobe. [name] in December, 2019; pt demonstrated impulsivity and a dysphagia 2 diet with thin liquids was recommended at this time.      SLP Plan  Continue with current plan of care       Recommendations  Diet recommendations: Dysphagia 1 (puree);Nectar-thick liquid Liquids provided via: Cup;No straw Medication Administration: Whole meds with puree Supervision: Full supervision/cueing for compensatory strategies;Staff to assist with self feeding Compensations: Minimize environmental distractions;Slow rate;Small sips/bites Postural Changes and/or Swallow Maneuvers: Seated upright 90  degrees                Oral Care Recommendations: Oral care BID Follow up Recommendations: 24 hour supervision/assistance;Skilled Nursing facility SLP Visit Diagnosis: Dysphagia, oropharyngeal phase (R13.12) Plan: Continue with current plan of care       [place], [place], [place] Office Number: [phone]  Acie Fredrickson 09/10/2020, 3:59 PM

## 2020-09-10 NOTE — Progress Notes (Signed)
Physical Therapy Treatment Patient Details Name: Wayne Fuller MRN: [id number] DOB: [birth year] Today's Date: 09/10/2020    History of Present Illness Pt is 87 yo male who presented to the ED on 09/03/20 with shortness of breath.  He has now been admitted with AKI, hyperkalemia, acute resp failure with L pleural effusion s/p thoracentesis on 09/04/20 yielding 1500 cc fluid.  Pt underwent another thoracentesis 4/21 with 1.7L. Pt with medical hx of advanced dementia, CKD 4, HTN, and PAF.    PT Comments    Pt continues to require significant assist for mobility. Expect pt likely will continue to require assist with mobility going forward since progress with PT may not be successful due to pt's severe dementia and his medical issues. Returning to prior facitity would provide staff/resident familiarity that likely would be helpful but if facility is unable to provide the assist he needs he will likely need long term placement in another facility. PT will stay involved at this point to see if repetition with mobility will improve pt's mobility and decr burden of care. If pt not progressing in next few sessions will sign off.   Follow Up Recommendations  Supervision/Assistance - 24 hour (Recommend return to prior facility if they can provide assistance)     Equipment Recommendations  None recommended by PT    Recommendations for Other Services       Precautions / Restrictions Precautions Precautions: Fall    Mobility  Bed Mobility Overal bed mobility: Needs Assistance Bed Mobility: Supine to Sit;Sit to Supine     Supine to sit: Max assist Sit to supine: Total assist   General bed mobility comments: Assist to bring legs off of bed, elevate trunk into sitting and bring hips to EOB. Assist for all aspects to return to supine.    Transfers Overall transfer level: Needs assistance Equipment used: 1 person hand held assist Transfers: Sit to/from Stand Sit to Stand: Mod assist;Max assist          General transfer comment: On first stand from bed pt required max assist to bring hips up and for balance. On second attempt pt used IV pole in lt hand and stood with mod assist.  Ambulation/Gait                 Stairs             Wheelchair Mobility    Modified Rankin (Stroke Patients Only)       Balance Overall balance assessment: Needs assistance Sitting-balance support: No upper extremity supported Sitting balance-Leahy Scale: Fair     Standing balance support: Single extremity supported Standing balance-Leahy Scale: Poor Standing balance comment: Stood x 2 with mod assist to maintain standing on first attempt and min assist on second attempt.                            Cognition Arousal/Alertness: Awake/alert Behavior During Therapy: Restless Overall Cognitive Status: History of cognitive impairments - at baseline                                 General Comments: Hx of advanced dementia. Does not follow commands but with tactile cues will assist with mobility      Exercises      General Comments        Pertinent Vitals/Pain Pain Assessment: Faces Faces Pain Scale: No hurt  Home Living                      Prior Function            PT Goals (current goals can now be found in the care plan section) Acute Rehab PT Goals Patient Stated Goal: unable to state ; did attempt to call numbers for family in chart but unable to reach Progress towards PT goals: Not progressing toward goals - comment    Frequency    Min 2X/week      PT Plan Current plan remains appropriate    Co-evaluation              AM-PAC PT "6 Clicks" Mobility   Outcome Measure  Help needed turning from your back to your side while in a flat bed without using bedrails?: A Lot Help needed moving from lying on your back to sitting on the side of a flat bed without using bedrails?: A Lot Help needed moving to and from a  bed to a chair (including a wheelchair)?: A Lot Help needed standing up from a chair using your arms (e.g., wheelchair or bedside chair)?: A Lot Help needed to walk in hospital room?: Total Help needed climbing 3-5 steps with a railing? : Total 6 Click Score: 10    End of Session Equipment Utilized During Treatment: Gait belt Activity Tolerance: Patient tolerated treatment well;Other (comment) (limited due to cognition) Patient left: in bed;with bed alarm set;with call bell/phone within reach;Other (comment) (hand mitts in place) Nurse Communication: Mobility status (nurse tech) PT Visit Diagnosis: Other abnormalities of gait and mobility (R26.89);Muscle weakness (generalized) (M62.81)     Time: [id number] PT Time Calculation (min) (ACUTE ONLY): 17 min  Charges:  $Therapeutic Activity: 8-22 mins                     [place] Pager [phone] Office [place] 09/10/2020, 11:24 AM

## 2020-09-11 ENCOUNTER — Inpatient Hospital Stay (HOSPITAL_COMMUNITY): Payer: Medicare Other

## 2020-09-11 DIAGNOSIS — Z515 Encounter for palliative care: Secondary | ICD-10-CM | POA: Diagnosis not present

## 2020-09-11 DIAGNOSIS — J90 Pleural effusion, not elsewhere classified: Secondary | ICD-10-CM | POA: Diagnosis not present

## 2020-09-11 DIAGNOSIS — N179 Acute kidney failure, unspecified: Secondary | ICD-10-CM | POA: Diagnosis not present

## 2020-09-11 LAB — CBC WITH DIFFERENTIAL/PLATELET
Abs Immature Granulocytes: 0.02 10*3/uL (ref 0.00–0.07)
Basophils Absolute: 0 10*3/uL (ref 0.0–0.1)
Basophils Relative: 1 %
Eosinophils Absolute: 0 10*3/uL (ref 0.0–0.5)
Eosinophils Relative: 1 %
HCT: 24.8 % — ABNORMAL LOW (ref 39.0–52.0)
Hemoglobin: 7.2 g/dL — ABNORMAL LOW (ref 13.0–17.0)
Immature Granulocytes: 1 %
Lymphocytes Relative: 17 %
Lymphs Abs: 0.6 10*3/uL — ABNORMAL LOW (ref 0.7–4.0)
MCH: 31.9 pg (ref 26.0–34.0)
MCHC: 29 g/dL — ABNORMAL LOW (ref 30.0–36.0)
MCV: 109.7 fL — ABNORMAL HIGH (ref 80.0–100.0)
Monocytes Absolute: 0.3 10*3/uL (ref 0.1–1.0)
Monocytes Relative: 8 %
Neutro Abs: 2.7 10*3/uL (ref 1.7–7.7)
Neutrophils Relative %: 72 %
Platelets: 196 10*3/uL (ref 150–400)
RBC: 2.26 MIL/uL — ABNORMAL LOW (ref 4.22–5.81)
RDW: 16 % — ABNORMAL HIGH (ref 11.5–15.5)
WBC: 3.6 10*3/uL — ABNORMAL LOW (ref 4.0–10.5)
nRBC: 0 % (ref 0.0–0.2)

## 2020-09-11 LAB — BASIC METABOLIC PANEL
Anion gap: 7 (ref 5–15)
BUN: 42 mg/dL — ABNORMAL HIGH (ref 8–23)
CO2: 20 mmol/L — ABNORMAL LOW (ref 22–32)
Calcium: 8.3 mg/dL — ABNORMAL LOW (ref 8.9–10.3)
Chloride: 122 mmol/L — ABNORMAL HIGH (ref 98–111)
Creatinine, Ser: 3.28 mg/dL — ABNORMAL HIGH (ref 0.61–1.24)
GFR, Estimated: 17 mL/min — ABNORMAL LOW (ref >60)
Glucose, Bld: 84 mg/dL (ref 70–99)
Potassium: 5 mmol/L (ref 3.5–5.1)
Sodium: 149 mmol/L — ABNORMAL HIGH (ref 135–145)

## 2020-09-11 MED ORDER — HYDROMORPHONE HCL 1 MG/ML IJ SOLN: 0.2500 mg | INTRAMUSCULAR | Status: DC | PRN

## 2020-09-11 MED ORDER — SODIUM POLYSTYRENE SULFONATE 15 GM/60ML PO SUSP
15.0000 g | Freq: Once | ORAL | Status: AC
  Administered 2020-09-11: 15 g via ORAL
  Filled 2020-09-11: qty 60

## 2020-09-11 NOTE — Progress Notes (Addendum)
Chief Complaint: Patient was seen in consultation today for pleurX catheter placement   Referring Physician(s): Florentina Jenny PA-C  Supervising Physician: Ruthann Cancer  Patient Status: [facility] - In-pt  History of Present Illness: Wayne Fuller is a 87 y.o. male with advanced dementia, CKD IV, PAF admitted with ongoing SOB. He was found to have large left pleural effusion. Has undergone 2 large volume thoracentesis over the past week. His family is now moving forward with palliative/Hospice approach, but his effusion keeps coming back rapidly. IR is asked to place percutaneous tunneled pleural catheter. PMHx, meds, labs, imaging, allergies reviewed. Family contacted via telephone   Past Medical History:  Diagnosis Date  . BPH (benign prostatic hyperplasia)   . Chronic kidney disease    ckd stage 3  . Dementia ([place])   . GERD (gastroesophageal reflux disease)   . Hemorrhoids   . History of angina   . Hyperlipemia   . Hypertension   . Irregular heart rhythm   . Paroxysmal A-fib ([place]) 04/02/2015    Past Surgical History:  Procedure Laterality Date  . BACK SURGERY    . CERVICAL SPINE SURGERY    . IR THORACENTESIS ASP PLEURAL SPACE W/IMG GUIDE  09/04/2020  . IR THORACENTESIS ASP PLEURAL SPACE W/IMG GUIDE  09/09/2020  . SPINE SURGERY      Allergies: Patient has no known allergies.  Medications:  Current Facility-Administered Medications:  .  acetaminophen (TYLENOL) tablet 500 mg, 500 mg, Oral, Q4H PRN, Etta Quill, DO, 500 mg at 09/05/20 0424 .  albuterol (VENTOLIN HFA) 108 (90 Base) MCG/ACT inhaler 4 puff, 4 puff, Inhalation, Once, Lajean Saver, MD .  amiodarone (PACERONE) tablet 200 mg, 200 mg, Oral, Daily, Jennette Kettle M, DO, 200 mg at 09/11/20 1007 .  amLODipine (NORVASC) tablet 10 mg, 10 mg, Oral, Daily, Jennette Kettle M, DO, 10 mg at 09/11/20 1007 .  dextrose 50 % solution 50 mL, 1 ampule, Intravenous, Once, Pahwani, Ravi, MD .  famotidine (PEPCID)  tablet 20 mg, 20 mg, Oral, Daily PRN, Etta Quill, DO, 20 mg at 09/10/20 2023 .  guaiFENesin (ROBITUSSIN) 100 MG/5ML solution 200 mg, 200 mg, Oral, Q6H PRN, Etta Quill, DO .  hydrALAZINE (APRESOLINE) tablet 100 mg, 100 mg, Oral, TID, Jennette Kettle M, DO, 100 mg at 09/11/20 1007 .  insulin aspart (novoLOG) injection 10 Units, 10 Units, Intravenous, Once, Pahwani, Ravi, MD .  ipratropium (ATROVENT HFA) inhaler 2 puff, 2 puff, Inhalation, Once, Lajean Saver, MD .  labetalol (NORMODYNE) injection 10 mg, 10 mg, Intravenous, Q2H PRN, Pahwani, Ravi, MD .  lidocaine (PF) (XYLOCAINE) 1 % injection, , Infiltration, PRN, Ardis Rowan, PA-C, 5 mL at 09/09/20 1548 .  melatonin tablet 3 mg, 3 mg, Oral, QHS, Gardner, Jared M, DO, 3 mg at 09/10/20 2023 .  metoprolol succinate (TOPROL-XL) 24 hr tablet 25 mg, 25 mg, Oral, Daily, Jennette Kettle M, DO, 25 mg at 09/11/20 1008 .  neomycin-bacitracin-polymyxin (NEOSPORIN) ointment packet 1 application, 1 application, Topical, PRN, Alcario Drought, Jared M, DO .  polyethylene glycol (MIRALAX / GLYCOLAX) packet 17 g, 17 g, Oral, Daily, Jennette Kettle M, DO, 17 g at 09/11/20 1007 .  Resource ThickenUp Clear, , Oral, PRN, Darliss Cheney, MD .  sodium bicarbonate tablet 650 mg, 650 mg, Oral, BID, Etta Quill, DO, 650 mg at 09/11/20 1007 .  sodium chloride (OCEAN) 0.65 % nasal spray 2 spray, 2 spray, Each Nare, Daily, Jennette Kettle M, DO, 2 spray at 09/11/20 1009 .  tamsulosin (FLOMAX) capsule 0.4 mg, 0.4 mg, Oral, QHS, Gardner, Jared M, DO, 0.4 mg at 09/10/20 2022    Family History  Problem Relation Age of Onset  . Stroke Mother   . Hypertension Sister   . Other Son        Homicide, stabbed  . Stroke Son   . Hypertension Son   . Diabetes Son   . Hypertension Son     Social History   Socioeconomic History  . Marital status: Married    Spouse name: Not on file  . Number of children: Not on file  . Years of education: Not on file  . Highest  education level: Not on file  Occupational History  . Not on file  Tobacco Use  . Smoking status: Former Smoker    Packs/day: 0.50    Years: 10.00    Pack years: 5.00    Types: Cigarettes  . Smokeless tobacco: Never Used  . Tobacco comment: quit smoking  around 1996  Vaping Use  . Vaping Use: Never used  Substance and Sexual Activity  . Alcohol use: No    Comment: hx of occasional ETOH use  . Drug use: No  . Sexual activity: Not Currently  Other Topics Concern  . Not on file  Social History Narrative   Diet? Regular      Do you drink/eat things with caffeine? Soda, sometimes      Marital status?      widow                              What year were you married? 68 years      Do you live in a house, apartment, assisted living, condo, trailer, etc.? house      Is it one or more stories? n/a      How many persons live in your home? 1      Do you have any pets in your home? (please list) no      Current or past profession: work in Associate Professor      Do you exercise?          no                            Type & how often?      Do you have a living will?  yes      Do you have a DNR form?     no                             If not, do you want to discuss one?      Do you have signed POA/HPOA for forms? yes         Social Determinants of Health   Financial Resource Strain: Not on file  Food Insecurity: Not on file  Transportation Needs: Not on file  Physical Activity: Not on file  Stress: Not on file  Social Connections: Not on file    Review of Systems: A 12 point ROS discussed and pertinent positives are indicated in the HPI above.  All other systems are negative.  Review of Systems  Vital Signs: BP (!) 171/54 (BP Location: Left Arm)   Pulse 64   Temp 97.8 F (36.6 C) (Axillary)   Resp 18   Ht 6' 2.02" (1.88  m)   Wt 81 kg   SpO2 95%   BMI 22.92 kg/m   Physical Exam HENT:     Mouth/Throat:     Mouth: Mucous membranes are moist.     Pharynx:  Oropharynx is clear.  Cardiovascular:     Rate and Rhythm: Normal rate. Rhythm irregular.     Heart sounds: Normal heart sounds.  Pulmonary:     Effort: Pulmonary effort is normal. No respiratory distress.     Comments: Diminished (L)BS Neurological:     Mental Status: He is disoriented.     Comments: Sleeping.     Imaging: DG Chest 1 View  Result Date: 09/09/2020 CLINICAL DATA:  Post left thoracentesis EXAM: CHEST  1 VIEW COMPARISON:  09/08/2020 FINDINGS: Significant improvement in left pleural effusion. Improvement in left lower lobe atelectasis. No pneumothorax Right lung remains clear without effusion on the right. IMPRESSION: No complication post left thoracentesis. Electronically Signed   By: Franchot Gallo M.D.   On: 09/09/2020 16:41   DG Chest 1 View  Result Date: 09/04/2020 CLINICAL DATA:  87 year old male with a history of thoracentesis EXAM: CHEST  1 VIEW COMPARISON:  09/03/2020 FINDINGS: Cardiomediastinal silhouette unchanged with cardiomegaly. Similar appearance of mild central vascular congestion. Improved opacity at the left lung base, right better visualization of the left heart and improved aeration at the base. No pneumothorax. No new confluent airspace disease. Surgical changes of the cervical region incompletely imaged IMPRESSION: Negative for complicating features status post left thoracentesis. Electronically Signed   By: Corrie Mckusick D.O.   On: 09/04/2020 12:31   CT Chest Wo Contrast  Result Date: 09/03/2020 CLINICAL DATA:  Pleural effusion EXAM: CT CHEST WITHOUT CONTRAST TECHNIQUE: Multidetector CT imaging of the chest was performed following the standard protocol without IV contrast. COMPARISON:  04/30/2009 FINDINGS: Cardiovascular: Mild coronary artery calcification. Global cardiac size is mildly enlarged with mild left ventricular dilation. Hypoattenuation of the cardiac blood pool is in keeping with at least mild anemia. No pericardial effusion. The central  pulmonary arteries are enlarged in keeping with changes of pulmonary arterial hypertension. Moderate atherosclerotic calcification is seen within the thoracic aorta. No aortic aneurysm identified. Mediastinum/Nodes: The visualized thyroid is unremarkable. No pathologic thoracic adenopathy. The esophagus is unremarkable. Lungs/Pleura: Large left pleural effusion is present with complete collapse of the left lower lobe and compressive atelectasis of the lingula. Minimal ground-glass infiltrate and traction bronchiolectasis within the right apex likely represents mild fibrotic change. No additional superimposed focal pulmonary infiltrate. No pneumothorax. No pleural effusion on the right. The central airways are widely patent. Upper Abdomen: No acute abnormality. Musculoskeletal: No acute bone abnormality. No lytic or blastic bone lesion. IMPRESSION: Large left pleural effusion with collapse of the left lower lobe. Mild coronary artery calcification. Mild cardiomegaly with left ventricular dilation. Morphologic changes in keeping with pulmonary arterial hypertension. Mild anemia Aortic Atherosclerosis (ICD10-I70.0). Electronically Signed   By: Fidela Salisbury MD   On: 09/03/2020 23:50   US RENAL  Result Date: 09/04/2020 CLINICAL DATA:  Acute kidney failure, history dementia, hypertension, former smoker EXAM: ULTRASOUND RENAL LIMITED COMPARISON:  01/30/2016 FINDINGS: Right Kidney: Renal measurements: 7.8 x 4.0 x 6.5 cm = volume: 109 mL. Cortical thinning. Increased cortical echogenicity. Small cyst at upper pole 16 x 17 x 18 mm. No additional mass or hydronephrosis. No shadowing calcification. Left Kidney: Patient uncooperative/combative, unable to image LEFT kidney. Procedure terminated. IMPRESSION: Medical renal disease changes of RIGHT kidney with small cyst at upper pole. Premature termination  of exam due to patient noncompliance, LEFT kidney not imaged. Electronically Signed   By: Lavonia Dana M.D.   On:  09/04/2020 14:31   DG CHEST PORT 1 VIEW  Result Date: 09/11/2020 CLINICAL DATA:  Pleural effusion EXAM: PORTABLE CHEST 1 VIEW COMPARISON:  09/10/2020 FINDINGS: Moderate layering left pleural effusion, grossly unchanged. Associated left lower lobe opacity, likely compressive atelectasis. Right lung is clear. No pneumothorax. Heart is normal in size. Cervical spine fixation hardware, incompletely visualized. IMPRESSION: Moderate layering left pleural effusion, grossly unchanged. Associated left lower lobe opacity, likely compressive atelectasis. Electronically Signed   By: Julian Hy M.D.   On: 09/11/2020 07:54   DG CHEST PORT 1 VIEW  Result Date: 09/10/2020 CLINICAL DATA:  Pleural effusion.  Confusion. EXAM: PORTABLE CHEST 1 VIEW COMPARISON:  Yesterday FINDINGS: Increased versus preferential inferior accumulation of the left pleural effusion. Underlying left pulmonary opacity persists. Clear right lung. Stable heart size. IMPRESSION: Increased versus re-distributed left pleural effusion which is at least moderate volume. Electronically Signed   By: Monte Fantasia M.D.   On: 09/10/2020 07:53   DG CHEST PORT 1 VIEW  Result Date: 09/08/2020 CLINICAL DATA:  Shortness of breath. EXAM: PORTABLE CHEST 1 VIEW COMPARISON:  09/04/2020. FINDINGS: Patient rotated to the right. Cardiomegaly. Mild bilateral interstitial prominence. Large left pleural effusion, increased in size from prior exam. No pneumothorax. Prior cervicothoracic spine fusion. Degenerative change thoracic spine. IMPRESSION: 1. Cardiomegaly. Bilateral interstitial prominence. Interstitial edema could present this fashion. 2.  Large left pleural effusion, increased in size from prior exam. Electronically Signed   By: [place]   On: 09/08/2020 15:26   DG Chest Port 1 View  Result Date: 09/03/2020 CLINICAL DATA:  Altered mental status. EXAM: PORTABLE CHEST 1 VIEW COMPARISON:  June 19, 2019 FINDINGS: There is opacification of the  left lung base and mid to lower left lung. A moderate size left pleural effusion is seen. No pneumothorax is identified. The cardiac silhouette is moderately enlarged. There is moderate severity calcification of the aortic arch. Degenerative changes seen throughout the thoracic spine. IMPRESSION: 1. Cardiomegaly with additional findings likely consistent with marked severity left basilar atelectasis and/or infiltrate. Follow-up to resolution is recommended, as an underlying neoplastic process cannot be excluded. 2. Moderate sized left-sided pleural effusion. Electronically Signed   By: Virgina Norfolk M.D.   On: 09/03/2020 20:42   DG Swallowing Func-Speech Pathology  Result Date: 09/09/2020 Objective Swallowing Evaluation: Type of Study: MBS-Modified Barium Swallow Study  Patient Details Name: Wayne Fuller MRN: [id number] Date of Birth: 06/04/1923 Today's Date: 09/09/2020 Time: SLP Start Time (ACUTE ONLY): 0945 -SLP Stop Time (ACUTE ONLY): 1000 SLP Time Calculation (min) (ACUTE ONLY): 15 min Past Medical History: Past Medical History: Diagnosis Date . BPH (benign prostatic hyperplasia)  . Chronic kidney disease   ckd stage 3 . Dementia ([place])  . GERD (gastroesophageal reflux disease)  . Hemorrhoids  . History of angina  . Hyperlipemia  . Hypertension  . Irregular heart rhythm  . Paroxysmal A-fib ([place]) 04/02/2015 Past Surgical History: Past Surgical History: Procedure Laterality Date . BACK SURGERY   . CERVICAL SPINE SURGERY   . IR THORACENTESIS ASP PLEURAL SPACE W/IMG GUIDE  09/04/2020 . SPINE SURGERY   HPI: Pt is a 87 y.o. male with medical history significant of advanced dementia, CKD 4, HTN, PAF not on anticoagulation. Pt presented to the ED with c/o SOB and and poor oral intake over the past week. CT chest 4/14: Large left pleural effusion with  collapse of the left lower lobe. [name] in December, 2019; pt demonstrated impulsivity and a dysphagia 2 diet with thin liquids was recommended at this  time.  No data recorded Assessment / Plan / Recommendation CHL IP CLINICAL IMPRESSIONS 09/09/2020 Clinical Impression Pt presents with mild-moderate oropharygeal dysphagia, with probable impact of baseline cognitive deficits, c/b impaired mastication of mechanical soft solids and penetration/aspiration of liquids. Thin liquids via cup resulted in trace-moderate silent aspiration (PAS 8) and clinican was unable to elicit cued cough from pt. Nectar thick liquids via cup resulted in penetration during the swallow (PAS 3) and mild residue in pyriform sinuses which was cleared with subsequent independent swallows. Use of straw with nectar liquids only decreased bolus cohesion/manipulation and premature spillage of liquids and subsequent aspiration (PAS 8) noted x1. Penetration/aspiration eliminated when nectar thick liquids provided via cup and administered in small amounts by clinician. Solids WFL, except for perseveration on mastication of mechanical soft solids requiring clincian to offer liquid wash and verbal cues to initiate swallow. Recommend dysphagia 1, NTL (no straw) diet with full supervision and assist for all meals to provide small sips/bites at slow rate and alternate liquids with solids. Will f/u for continued tolerance and education. SLP Visit Diagnosis Dysphagia, oropharyngeal phase (R13.12) Attention and concentration deficit following -- Frontal lobe and executive function deficit following -- Impact on safety and function Moderate aspiration risk   CHL IP TREATMENT RECOMMENDATION 09/09/2020 Treatment Recommendations Therapy as outlined in treatment plan below   Prognosis 09/09/2020 Prognosis for Safe Diet Advancement Fair Barriers to Reach Goals Cognitive deficits;Severity of deficits Barriers/Prognosis Comment -- [facility] IP DIET RECOMMENDATION 09/09/2020 SLP Diet Recommendations Dysphagia 1 (Puree) solids;Nectar thick liquid Liquid Administration via Cup;No straw Medication Administration Crushed with puree  Compensations Minimize environmental distractions;Slow rate;Small sips/bites Postural Changes Remain semi-upright after after feeds/meals (Comment);Seated upright at 90 degrees   CHL IP OTHER RECOMMENDATIONS 09/09/2020 Recommended Consults -- Oral Care Recommendations Oral care BID;Staff/trained caregiver to provide oral care Other Recommendations Order thickener from pharmacy   CHL IP FOLLOW UP RECOMMENDATIONS 09/09/2020 Follow up Recommendations 24 hour supervision/assistance;Skilled Nursing facility   [facility] IP FREQUENCY AND DURATION 09/09/2020 Speech Therapy Frequency (ACUTE ONLY) min 2x/week Treatment Duration 2 weeks      CHL IP ORAL PHASE 09/09/2020 Oral Phase Impaired Oral - Pudding Teaspoon -- Oral - Pudding Cup -- Oral - Honey Teaspoon -- Oral - Honey Cup -- Oral - Nectar Teaspoon -- Oral - Nectar Cup -- Oral - Nectar Straw -- Oral - Thin Teaspoon NT Oral - Thin Cup WFL Oral - Thin Straw Premature spillage Oral - Puree WFL Oral - Mech Soft Other (Comment);Impaired mastication Oral - Regular NT Oral - Multi-Consistency NT Oral - Pill NT Oral Phase - Comment --  CHL IP PHARYNGEAL PHASE 09/09/2020 Pharyngeal Phase Impaired Pharyngeal- Pudding Teaspoon -- Pharyngeal -- Pharyngeal- Pudding Cup -- Pharyngeal -- Pharyngeal- Honey Teaspoon -- Pharyngeal -- Pharyngeal- Honey Cup -- Pharyngeal -- Pharyngeal- Nectar Teaspoon NT Pharyngeal -- Pharyngeal- Nectar Cup Penetration/Aspiration during swallow;Delayed swallow initiation-pyriform sinuses;Pharyngeal residue - valleculae;Trace aspiration Pharyngeal Material enters airway, remains ABOVE vocal cords and not ejected out;Material enters airway, passes BELOW cords without attempt by patient to eject out (silent aspiration) Pharyngeal- Nectar Straw Pharyngeal residue - valleculae;Delayed swallow initiation-pyriform sinuses Pharyngeal Material enters airway, remains ABOVE vocal cords and not ejected out Pharyngeal- Thin Teaspoon NT Pharyngeal -- Pharyngeal- Thin Cup Delayed  swallow initiation-pyriform sinuses;Trace aspiration;Moderate aspiration;Penetration/Aspiration during swallow Pharyngeal Material enters airway, passes BELOW  cords without attempt by patient to eject out (silent aspiration);Material enters airway, passes BELOW cords and not ejected out despite cough attempt by patient Pharyngeal- Thin Straw NT Pharyngeal -- Pharyngeal- Puree WFL Pharyngeal -- Pharyngeal- Mechanical Soft WFL Pharyngeal -- Pharyngeal- Regular NT Pharyngeal -- Pharyngeal- Multi-consistency NT Pharyngeal -- Pharyngeal- Pill NT Pharyngeal -- Pharyngeal Comment --  CHL IP CERVICAL ESOPHAGEAL PHASE 09/09/2020 Cervical Esophageal Phase WFL Pudding Teaspoon -- Pudding Cup -- Honey Teaspoon -- Honey Cup -- Nectar Teaspoon -- Nectar Cup -- Nectar Straw -- Thin Teaspoon -- Thin Cup -- Thin Straw -- Puree -- Mechanical Soft -- Regular -- Multi-consistency -- Pill -- Cervical Esophageal Comment -- Ellwood Dense, MA, CCC-SLP Acute Rehabilitation Services Office Number: [phone] Acie Fredrickson 09/09/2020, 12:42 PM              IR THORACENTESIS ASP PLEURAL SPACE W/IMG GUIDE  Result Date: 09/09/2020 INDICATION: Acute hypoxic respiratory failure due to large left-sided pleural effusion and possible left lower lobe community-acquired pneumonia. Request for therapeutic thoracentesis. EXAM: ULTRASOUND GUIDED LEFT THORACENTESIS MEDICATIONS: 1% lidocaine 10 mL COMPLICATIONS: None immediate. PROCEDURE: An ultrasound guided thoracentesis was thoroughly discussed with the patient and questions answered. The benefits, risks, alternatives and complications were also discussed. The patient understands and wishes to proceed with the procedure. Written consent was obtained. Ultrasound was performed to localize and mark an adequate pocket of fluid in the left chest. The area was then prepped and draped in the normal sterile fashion. 1% Lidocaine was used for local anesthesia. Under ultrasound guidance a 6 Fr  Safe-T-Centesis catheter was introduced. Thoracentesis was performed. The catheter was removed and a dressing applied. FINDINGS: A total of approximately 1.7 L of bloody fluid was removed. Procedure stopped due to patient coughing. IMPRESSION: Successful ultrasound guided left thoracentesis yielding 1.7 L of pleural fluid. Read by: Gareth Eagle, PA-C Electronically Signed   By: Miachel Roux M.D.   On: 09/09/2020 16:22   IR THORACENTESIS ASP PLEURAL SPACE W/IMG GUIDE  Result Date: 09/04/2020 INDICATION: Patient with history of dementia and chronic kidney disease. Admitted to the hospital with shortness of breath. Found to have left-sided pleural effusion. Request is for therapeutic and diagnostic left-sided thoracentesis. EXAM: ULTRASOUND GUIDED THERAPEUTIC AND DIAGNOSTIC LEFT-SIDED THORACENTESIS MEDICATIONS: Lidocaine 1% 10 mL COMPLICATIONS: None immediate. PROCEDURE: An ultrasound guided thoracentesis was thoroughly discussed with the patient and questions answered. The benefits, risks, alternatives and complications were also discussed. The patient understands and wishes to proceed with the procedure. Written consent was obtained. Ultrasound was performed to localize and mark an adequate pocket of fluid in the left chest. The area was then prepped and draped in the normal sterile fashion. 1% Lidocaine was used for local anesthesia. Under ultrasound guidance a 6 Fr Safe-T-Centesis catheter was introduced. Thoracentesis was performed. The catheter was removed and a dressing applied. FINDINGS: A total of approximately 1.5 L of amber colored fluid was removed. Samples were sent to the laboratory as requested by the clinical team. IMPRESSION: Successful ultrasound guided therapeutic and diagnostic left-sided thoracentesis thoracentesis yielding 1.5 L of pleural fluid. Read by: Rushie Nyhan, NP Electronically Signed   By: Jacqulynn Cadet M.D.   On: 09/04/2020 12:50    Labs:  CBC: Recent Labs     09/08/20 0354 09/09/20 0302 09/10/20 0200 09/11/20 0302  WBC 4.4 4.1 3.5* 3.6*  HGB 8.2* 7.7* 7.5* 7.2*  HCT 27.4* 26.9* 25.3* 24.8*  PLT 190 194 187 196    COAGS: No results for input(s):  INR, APTT in the last 8760 hours.  BMP: Recent Labs    09/08/20 0354 09/09/20 0302 09/10/20 0200 09/11/20 0302  NA 146* 146* 147* 149*  K 5.2* 4.8 4.8 5.0  CL 119* 120* 123* 122*  CO2 20* 19* 19* 20*  GLUCOSE 85 120* 88 84  BUN 44* 42* 38* 42*  CALCIUM 8.2* 8.2* 8.1* 8.3*  CREATININE 3.18* 3.00* 2.97* 3.28*  GFRNONAA 17* 19* 19* 17*    LIVER FUNCTION TESTS: Recent Labs    09/03/20 2027 09/04/20 1249 09/05/20 0118  BILITOT 0.6  --  0.2*  AST 25  --  20  ALT 21  --  15  ALKPHOS 86  --  61  PROT 7.2 6.2* 5.1*  ALBUMIN 3.3* 2.7* 2.2*    TUMOR MARKERS: No results for input(s): AFPTM, CEA, CA199, CHROMGRNA in the last 8760 hours.  Assessment and Plan: Recurrent left pleural effusion. Planning for Hospice care (L[facility] is indicated. Labs reviewed. Discussed with daughter Claudine Mouton over the phone. Procedure explained. Tentatively plan for Monday 4/25. Risks and benefits discussed with the patient including bleeding, infection, damage to adjacent structures, and sepsis.  All questions were answered, patient is agreeable to proceed. Consent obtained from daughter.   Thank you for this interesting consult.  I greatly enjoyed meeting Jacobson Milan and look forward to participating in their care.  A copy of this report was sent to the requesting provider on this date.  Electronically Signed: Ascencion Dike, PA-C 09/11/2020, 2:44 PM   I spent a total of 25 minutes in face to face in clinical consultation, greater than 50% of which was counseling/coordinating care for (L)[facility]le[facility]

## 2020-09-11 NOTE — Progress Notes (Signed)
PROGRESS NOTE    Wayne Fuller  [id number] DOB: 1923-10-13 DOA: 09/03/2020 PCP: [name], NP   Brief Narrative:  Wayne Fuller is a 87 y.o. male with medical history significant of advanced dementia, CKD 4, HTN, PAF not on anticoagulation was brought to the ED with c/o SOB ongoing for past few days.  Associated generalized weakness, poor PO intake over past week or so.  Had 2 days of N/V/D due to viral illness that was going around the SNF between 1-2 weeks ago according to daughter. No reported fevers, fall/trauma.  Upon arrival to ED, he was hypoxic requiring oxygen and also had AKI on CKD stage IV with hyperkalemia.  COVID neg. he did have peaked T waves on EKG.  There was some concern about left lower lobe pneumonia on the chest x-ray for which he was started on Rocephin and Zithromax.  CT chest showed large left-sided pleural effusion.  He was admitted to hospital service.  IR was consulted and he underwent left thoracentesis with 1500 cc of urine.  Fluid analysis indicated transudative fluid.  Patient's oxygen demand improved.  He was then started on gentle IV hydration as his p.o. intake was not enough.  With that, his renal function started to improve as well.  He also developed acute on chronic anemia where his hemoglobin dropped to 6.1 on 09/05/2020 for which she received 1 unit of PRBC suspicion and since then his hemoglobin remained stable over 7.5.  Repeat chest x-ray was done on 09/08/2020 which once again showed large left-sided pleural effusion.  Another thoracentesis was done on 09/09/2020 with removal of 1.7 L of bloody fluid.  Palliative care has been following patient.  Repeat chest x-ray since last 2 days shows slow reaccumulation of the left pleural effusion again.  Palliative care and I had a long discussion with the family and family decided to pursue comfort care and Pleurx catheter  Assessment & Plan:   Principal Problem:   Acute kidney failure ([place]) Active  Problems:   Essential hypertension   CKD (chronic kidney disease) stage 4, GFR 15-29 ml/min (HCC)   Dementia with behavioral disturbance (HCC)   Hyperkalemia   Pleural effusion on left  Acute hypoxic respiratory failure due to large left-sided pleural effusion and possible left lower lobe community-acquired pneumonia: Status post diagnostic and therapeutic thoracentesis 09/04/2020 with a yield of 1500 cc fluid.  Fluid analysis indicate transudative fluid.  Patient's respiratory status has improved.  Patient has remained on room air and looks comfortable for last couple of days however repeat chest x-ray on 09/08/2020 showed accumulation of large left-sided pleural effusion.  Patient underwent another therapeutic thoracentesis on 09/09/2020 with removal of 1.7 L bloody fluid.  Chest x-ray again today shows slightly worsened pleural effusion yesterday and today however patient remains comfortable on room air.  Patient completed antibiotic for 5 days on 09/09/2020.  Discussion with the family today, patient transition to full comfort care and will get Pleurx catheter.  Family undecided about displacement though.  AKI on CKD stage IV/Mild metabolic acidosis: Baseline creatinine around 2.5.  Presented with 4.06.  Improved to 3.0 but then creatinine declined again today.  Now transition to comfort care.  Hyperkalemia: Resolved  Acute on chronic anemia: He has anemia of chronic disease due to CKD.  His hemoglobin dropped to 6.1 on 09/05/2020 for which she received 1 unit of PRBC transfusion, since then his hemoglobin has been dropping slowly and currently 7.2.  Now he is comfort care.  Advanced dementia/Wheelchair-bound at baseline: Patient has pretty advanced dementia.  Continue quetiapine to prevent delirium.  Essential hypertension: Blood pressure fairly controlled.  Continue home medications which include amlodipine 10 mg, hydralazine 100 mg p.o. 3 times daily, Toprol-XL 25 mg daily.  Continue labetalol as  needed.  Paroxysmal atrial fibrillation: Rate fairly controlled.  Continue amiodarone, Toprol-XL and baby aspirin.  Goal of care discussion: Palliative care on board.  Patient DNR.  Dysphagia: SLP on board.  He is on dysphagia 1 diet  DVT prophylaxis:    Code Status: DNR  Family Communication:  discussion with patient's son and daughter along with palliative care.  Status is: Inpatient  Remains inpatient appropriate because:Inpatient level of care appropriate due to severity of illness   Dispo: The patient is from: Memory care unit              Anticipated d/c is to: Memory care unit/undecided              Patient currently is not medically stable to d/c.     Difficult to place patient No        Estimated body mass index is 22.92 kg/m as calculated from the following:   Height as of this encounter: 6' 2.02" (1.88 m).   Weight as of this encounter: 81 kg.      Nutritional status:               Consultants:   IR  Palliative care  Procedures:   Left thoracentesis  Antimicrobials:  Anti-infectives (From admission, onward)   Start     Dose/Rate Route Frequency Ordered Stop   09/07/20 1000  azithromycin (ZITHROMAX) tablet 500 mg        500 mg Oral Daily 09/06/20 1156 09/07/20 0952   09/04/20 2200  cefTRIAXone (ROCEPHIN) 2 g in sodium chloride 0.9 % 100 mL IVPB        2 g 200 mL/hr over 30 Minutes Intravenous Every 24 hours 09/03/20 2312 09/09/20 2159   09/04/20 2200  azithromycin (ZITHROMAX) 500 mg in sodium chloride 0.9 % 250 mL IVPB  Status:  Discontinued        500 mg 250 mL/hr over 60 Minutes Intravenous Every 24 hours 09/03/20 2312 09/06/20 1156   09/04/20 1730  cefTRIAXone (ROCEPHIN) 1 g in sodium chloride 0.9 % 100 mL IVPB  Status:  Discontinued        1 g 200 mL/hr over 30 Minutes Intravenous Every 24 hours 09/04/20 1634 09/04/20 1643   09/04/20 1730  azithromycin (ZITHROMAX) 500 mg in sodium chloride 0.9 % 250 mL IVPB  Status:  Discontinued         500 mg 250 mL/hr over 60 Minutes Intravenous Every 24 hours 09/04/20 1634 09/04/20 1644   09/03/20 2230  cefTRIAXone (ROCEPHIN) 1 g in sodium chloride 0.9 % 100 mL IVPB        1 g 200 mL/hr over 30 Minutes Intravenous  Once 09/03/20 2229 09/03/20 2332   09/03/20 2230  azithromycin (ZITHROMAX) 500 mg in sodium chloride 0.9 % 250 mL IVPB        500 mg 250 mL/hr over 60 Minutes Intravenous  Once 09/03/20 2229 09/04/20 0148         Subjective: Seen and examined.  Alert but pleasantly confused at baseline.  Looks comfortable.  Dysarthric.  Objective: Vitals:   09/11/20 0740 09/11/20 0800 09/11/20 1206 09/11/20 1219  BP: (!) 164/57  (!) 171/54   Pulse: 65  62 64  Resp: 20  18   Temp: 98.5 F (36.9 C)   97.8 F (36.6 C)  TempSrc: Oral   Axillary  SpO2: 95% 96% 92% 95%  Weight:      Height:        Intake/Output Summary (Last 24 hours) at 09/11/2020 1419 Last data filed at 09/11/2020 0414 Gross per 24 hour  Intake --  Output 1000 ml  Net -1000 ml   Filed Weights   09/04/20 0315  Weight: 81 kg    Examination: General exam: Appears calm and comfortable, has hand mittens Respiratory system: Diminished breath sounds at bases bilaterally and left middle lobe with some crackles in the left middle lobe. Respiratory effort normal. Cardiovascular system: S1 & S2 heard, RRR. No JVD, murmurs, rubs, gallops or clicks. No pedal edema. Gastrointestinal system: Abdomen is nondistended, soft and nontender. No organomegaly or masses felt. Normal bowel sounds heard. Central nervous system: Alert but not oriented. Extremities: Symmetric 5 x 5 power. Skin: No rashes, lesions or ulcers.    Data Reviewed: I have personally reviewed following labs and imaging studies  CBC: Recent Labs  Lab 09/07/20 0847 09/08/20 0354 09/09/20 0302 09/10/20 0200 09/11/20 0302  WBC 4.0 4.4 4.1 3.5* 3.6*  NEUTROABS 3.4 3.3 3.4 2.7 2.7  HGB 7.7* 8.2* 7.7* 7.5* 7.2*  HCT 25.4* 27.4* 26.9* 25.3* 24.8*   MCV 106.7* 106.2* 110.7* 108.1* 109.7*  PLT 196 190 194 187 [id number]   Basic Metabolic Panel: Recent Labs  Lab 09/06/20 0556 09/07/20 0847 09/08/20 0354 09/09/20 0302 09/10/20 0200 09/11/20 0302  NA 143 144 146* 146* 147* 149*  K 4.6 4.4 5.2* 4.8 4.8 5.0  CL 113* 118* 119* 120* 123* 122*  CO2 20* 21* 20* 19* 19* 20*  GLUCOSE 97 88 85 120* 88 84  BUN 51* 42* 44* 42* 38* 42*  CREATININE 3.68* 3.39* 3.18* 3.00* 2.97* 3.28*  CALCIUM 8.2* 8.0* 8.2* 8.2* 8.1* 8.3*  MG 2.1  --   --   --   --   --    GFR: Estimated Creatinine Clearance: 15.8 mL/min (A) (by C-G formula based on SCr of 3.28 mg/dL (H)). Liver Function Tests: Recent Labs  Lab 09/05/20 0118  AST 20  ALT 15  ALKPHOS 61  BILITOT 0.2*  PROT 5.1*  ALBUMIN 2.2*   No results for input(s): LIPASE, AMYLASE in the last 168 hours. No results for input(s): AMMONIA in the last 168 hours. Coagulation Profile: No results for input(s): INR, PROTIME in the last 168 hours. Cardiac Enzymes: No results for input(s): CKTOTAL, CKMB, CKMBINDEX, TROPONINI in the last 168 hours. BNP (last 3 results) No results for input(s): PROBNP in the last 8760 hours. HbA1C: No results for input(s): HGBA1C in the last 72 hours. CBG: Recent Labs  Lab 09/05/20 1611  GLUCAP 102*   Lipid Profile: No results for input(s): CHOL, HDL, LDLCALC, TRIG, CHOLHDL, LDLDIRECT in the last 72 hours. Thyroid Function Tests: No results for input(s): TSH, T4TOTAL, FREET4, T3FREE, THYROIDAB in the last 72 hours. Anemia Panel: No results for input(s): VITAMINB12, FOLATE, FERRITIN, TIBC, IRON, RETICCTPCT in the last 72 hours. Sepsis Labs: No results for input(s): PROCALCITON, LATICACIDVEN in the last 168 hours.  Recent Results (from the past 240 hour(s))  Resp Panel by RT-PCR (Flu A&B, Covid) Nasopharyngeal Swab     Status: None   Collection Time: 09/03/20  8:28 PM   Specimen: Nasopharyngeal Swab; Nasopharyngeal(NP) swabs in vial transport medium  Result Value  Ref Range Status  SARS Coronavirus 2 by RT PCR NEGATIVE NEGATIVE Final    Comment: (NOTE) SARS-CoV-2 target nucleic acids are NOT DETECTED.  The SARS-CoV-2 RNA is generally detectable in upper respiratory specimens during the acute phase of infection. The lowest concentration of SARS-CoV-2 viral copies this assay can detect is 138 copies/mL. A negative result does not preclude SARS-Cov-2 infection and should not be used as the sole basis for treatment or other patient management decisions. A negative result may occur with  improper specimen collection/handling, submission of specimen other than nasopharyngeal swab, presence of viral mutation(s) within the areas targeted by this assay, and inadequate number of viral copies(<138 copies/mL). A negative result must be combined with clinical observations, patient history, and epidemiological information. The expected result is Negative.  Fact Sheet for Patients:  EntrepreneurPulse.com.au  Fact Sheet for Healthcare Providers:  IncredibleEmployment.be  This test is no t yet approved or cleared by the [place] FDA and  has been authorized for detection and/or diagnosis of SARS-CoV-2 by FDA under an Emergency Use Authorization (EUA). This EUA will remain  in effect (meaning this test can be used) for the duration of the COVID-19 declaration under Section 564(b)(1) of the Act, 21 U.S.C.section 360bbb-3(b)(1), unless the authorization is terminated  or revoked sooner.       Influenza A by PCR NEGATIVE NEGATIVE Final   Influenza B by PCR NEGATIVE NEGATIVE Final    Comment: (NOTE) The Xpert Xpress SARS-CoV-2/FLU/RSV plus assay is intended as an aid in the diagnosis of influenza from Nasopharyngeal swab specimens and should not be used as a sole basis for treatment. Nasal washings and aspirates are unacceptable for Xpert Xpress SARS-CoV-2/FLU/RSV testing.  Fact Sheet for  Patients: EntrepreneurPulse.com.au  Fact Sheet for Healthcare Providers: IncredibleEmployment.be  This test is not yet approved or cleared by the [place] FDA and has been authorized for detection and/or diagnosis of SARS-CoV-2 by FDA under an Emergency Use Authorization (EUA). This EUA will remain in effect (meaning this test can be used) for the duration of the COVID-19 declaration under Section 564(b)(1) of the Act, 21 U.S.C. section 360bbb-3(b)(1), unless the authorization is terminated or revoked.  Performed at [facility], [place] [address], [place], [place] [address]   Blood culture (routine x 2)     Status: None   Collection Time: 09/03/20 11:11 PM   Specimen: BLOOD RIGHT FOREARM  Result Value Ref Range Status   Specimen Description BLOOD RIGHT FOREARM  Final   Special Requests   Final    BOTTLES DRAWN AEROBIC AND ANAEROBIC Blood Culture adequate volume   Culture   Final    NO GROWTH 5 DAYS Performed at [facility], [place] [address], [place], [place] [address]    Report Status 09/08/2020 FINAL  Final  Blood culture (routine x 2)     Status: None   Collection Time: 09/03/20 11:23 PM   Specimen: BLOOD LEFT HAND  Result Value Ref Range Status   Specimen Description BLOOD LEFT HAND  Final   Special Requests   Final    BOTTLES DRAWN AEROBIC AND ANAEROBIC Blood Culture adequate volume   Culture   Final    NO GROWTH 5 DAYS Performed at [facility], [place] [address] [place], [place] [address]    Report Status 09/09/2020 FINAL  Final  Body fluid culture w Gram Stain     Status: None   Collection Time: 09/04/20 11:56 AM   Specimen: Lung, Left; Pleural Fluid  Result  Value Ref Range Status   Specimen Description FLUID  Final   Special Requests LEFT THORACENTESIS  Final   Gram Stain NO WBC SEEN NO ORGANISMS SEEN   Final   Culture   Final    NO GROWTH 3 DAYS Performed at [facility], [address]  [place], [place] [address]    Report Status 09/08/2020 FINAL  Final  SARS CORONAVIRUS 2 (TAT 6-24 HRS) Nasopharyngeal Nasopharyngeal Swab     Status: None   Collection Time: 09/08/20  9:38 AM   Specimen: Nasopharyngeal Swab  Result Value Ref Range Status   SARS Coronavirus 2 NEGATIVE NEGATIVE Final    Comment: (NOTE) SARS-CoV-2 target nucleic acids are NOT DETECTED.  The SARS-CoV-2 RNA is generally detectable in upper and lower respiratory specimens during the acute phase of infection. Negative results do not preclude SARS-CoV-2 infection, do not rule out co-infections with other pathogens, and should not be used as the sole basis for treatment or other patient management decisions. Negative results must be combined with clinical observations, patient history, and epidemiological information. The expected result is Negative.  Fact Sheet for Patients: SugarRoll.be  Fact Sheet for Healthcare Providers: https://www.woods-mathews.com/  This test is not yet approved or cleared by the [place] FDA and  has been authorized for detection and/or diagnosis of SARS-CoV-2 by FDA under an Emergency Use Authorization (EUA). This EUA will remain  in effect (meaning this test can be used) for the duration of the COVID-19 declaration under Se ction 564(b)(1) of the Act, 21 U.S.C. section 360bbb-3(b)(1), unless the authorization is terminated or revoked sooner.  Performed at [facility], [place] [address], [place], [place] [address]       Radiology Studies: DG Chest 1 View  Result Date: 09/09/2020 CLINICAL DATA:  Post left thoracentesis EXAM: CHEST  1 VIEW COMPARISON:  09/08/2020 FINDINGS: Significant improvement in left pleural effusion. Improvement in left lower lobe atelectasis. No pneumothorax Right lung remains clear without effusion on the right. IMPRESSION: No complication post left thoracentesis. Electronically Signed   By: Franchot Gallo  M.D.   On: 09/09/2020 16:41   DG CHEST PORT 1 VIEW  Result Date: 09/11/2020 CLINICAL DATA:  Pleural effusion EXAM: PORTABLE CHEST 1 VIEW COMPARISON:  09/10/2020 FINDINGS: Moderate layering left pleural effusion, grossly unchanged. Associated left lower lobe opacity, likely compressive atelectasis. Right lung is clear. No pneumothorax. Heart is normal in size. Cervical spine fixation hardware, incompletely visualized. IMPRESSION: Moderate layering left pleural effusion, grossly unchanged. Associated left lower lobe opacity, likely compressive atelectasis. Electronically Signed   By: Julian Hy M.D.   On: 09/11/2020 07:54   DG CHEST PORT 1 VIEW  Result Date: 09/10/2020 CLINICAL DATA:  Pleural effusion.  Confusion. EXAM: PORTABLE CHEST 1 VIEW COMPARISON:  Yesterday FINDINGS: Increased versus preferential inferior accumulation of the left pleural effusion. Underlying left pulmonary opacity persists. Clear right lung. Stable heart size. IMPRESSION: Increased versus re-distributed left pleural effusion which is at least moderate volume. Electronically Signed   By: Monte Fantasia M.D.   On: 09/10/2020 07:53   IR THORACENTESIS ASP PLEURAL SPACE W/IMG GUIDE  Result Date: 09/09/2020 INDICATION: Acute hypoxic respiratory failure due to large left-sided pleural effusion and possible left lower lobe community-acquired pneumonia. Request for therapeutic thoracentesis. EXAM: ULTRASOUND GUIDED LEFT THORACENTESIS MEDICATIONS: 1% lidocaine 10 mL COMPLICATIONS: None immediate. PROCEDURE: An ultrasound guided thoracentesis was thoroughly discussed with the patient and questions answered. The benefits, risks, alternatives and complications were also discussed. The patient  understands and wishes to proceed with the procedure. Written consent was obtained. Ultrasound was performed to localize and mark an adequate pocket of fluid in the left chest. The area was then prepped and draped in the normal sterile fashion. 1%  Lidocaine was used for local anesthesia. Under ultrasound guidance a 6 Fr Safe-T-Centesis catheter was introduced. Thoracentesis was performed. The catheter was removed and a dressing applied. FINDINGS: A total of approximately 1.7 L of bloody fluid was removed. Procedure stopped due to patient coughing. IMPRESSION: Successful ultrasound guided left thoracentesis yielding 1.7 L of pleural fluid. Read by: Gareth Eagle, PA-C Electronically Signed   By: Miachel Roux M.D.   On: 09/09/2020 16:22    Scheduled Meds: . albuterol  4 puff Inhalation Once  . amiodarone  200 mg Oral Daily  . amLODipine  10 mg Oral Daily  . dextrose  1 ampule Intravenous Once  . hydrALAZINE  100 mg Oral TID  . insulin aspart  10 Units Intravenous Once  . ipratropium  2 puff Inhalation Once  . melatonin  3 mg Oral QHS  . metoprolol succinate  25 mg Oral Daily  . polyethylene glycol  17 g Oral Daily  . sodium bicarbonate  650 mg Oral BID  . sodium chloride  2 spray Each Nare Daily  . sodium polystyrene  15 g Oral Once  . tamsulosin  0.4 mg Oral QHS   Continuous Infusions:    LOS: 8 days   Time spent: 30 minutes   Darliss Cheney, MD [facility]  09/11/2020, 2:19 PM   To contact the attending provider between 7A-7P or the covering provider during after hours 7P-7A, please log into the web site [email or web address].

## 2020-09-11 NOTE — Progress Notes (Signed)
Daily Progress Note   Patient Name: Wayne Fuller       Date: 09/11/2020 DOB: 11-08-1923  Age: 87 y.o. MRN#: [id number] Attending Physician: Darliss Cheney, MD Primary Care Physician: [name], NP Admit Date: 09/03/2020  Reason for Consultation/Follow-up: To discuss complex medical decision making related to patient's goals of care  Subjective: Met with patient, dtr/HCPOA Avis and son Glendell Docker at bedside.  Then we moved into a conference room we were joined by pharmacy resident Romilda Garret. We discussed [name] love of staying active as well as his love of fishing and [place].  While he watched NASCAR on TV he did attend some races live.  Even at 87 years old he went to a NASCAR race.  Glendell Docker and Claudine Mouton are very concerned about their father.   They feel he needs more care than [facility] can provide and they want him to have more attention as he is nearing end of life.  We talked about his current situation - recurrent pleural effusion with renal failure.  Renal failure requires IVF but IVF causes faster Pleural fluid build up.   The family mentions his two large volume thoracentesis.     We discussed a pleurx catheter for drainage and comfort.  Both Avis and Glendell Docker are in agreement with moving forward with Plurex cath placement.  Glendell Docker brought up the subject of Hospice.  They do not want to lose their father but understand that everyone has a time to go eventually.  We discussed Hospice services at home, ALF, [facility], and [facility].   The family is unable to make a decision about disposition today.   They want to discuss it with the rest of their family members.  We discussed a shift to comfort measures.  The family is in agreement with focusing on his comfort.  Visitation  restrictions can be lifted.  They can bring in food from home.  We will continue BP medications until DC.  We will continue low dose medication intermittently for an elevated potassium.  Assessment: 87 yo gentleman with dementia.  Now with acute on chronic stage 4 renal failure and recurrent pleural effusions (thoracentsis 4/14 1.5 L and 4/21 1.7 L).  Family understands his prognosis is very limited and his time to be as good as possible.  Patient Profile/HPI:   87 y.o. male  with past medical history of dementia, hypertension, atrial fibrillation not on any anticoagulation, CKD stage 4 admitted on 09/03/2020 with breathing problems diagnosed with possible pneumonia and large left sided pleural effusion s/p thoracentesis 1500 cc removed. Also with worsening renal function.   Length of Stay: 8   Vital Signs: BP (!) 171/54 (BP Location: Left Arm)   Pulse 64   Temp 97.8 F (36.6 C) (Axillary)   Resp 18   Ht 6' 2.02" (1.88 m)   Wt 81 kg   SpO2 95%   BMI 22.92 kg/m  SpO2: SpO2: 95 % O2 Device: O2 Device: Room Air O2 Flow Rate: O2 Flow Rate (L/min): 3 L/min       Palliative Assessment/Data:  20%     Palliative Care Plan    Recommendations/Plan: Family requests a pleurx drain be placed.  Per IR this will likely be done Monday afternoon. Family agreeable to shift to comfort measures as they understand his prognosis is short. Plan to discharge with hospice - trying to determine the location (ALF, home, [place]). PMT will continue to follow with you.  Code Status:  DNR  Prognosis: Days to weeks  Discharge Planning: To Be Determined  Care plan was discussed with family, MD, RN, IR  Thank you for allowing the Palliative Medicine Team to assist in the care of this patient.  Total time spent:  75 min Time in 10:30 Time out 11:45     Greater than 50%  of this time was spent counseling and coordinating care related to the above assessment and plan.  Florentina Jenny, PA-C Palliative Medicine  Please contact Palliative MedicineTeam phone at [phone] for questions and concerns between 7 am - 7 pm.   Please see AMION for individual provider pager numbers.

## 2020-09-11 NOTE — Plan of Care (Signed)
Patient had family members visiting him today. Rested comfortably without any distress throughout the shift. Patient is now comfort care. No acute distress noted.  Problem: Education: Goal: Knowledge of disease and its progression will improve Outcome: Progressing   Problem: Health Behavior/Discharge Planning: Goal: Ability to manage health-related needs will improve Outcome: Progressing   Problem: Clinical Measurements: Goal: Complications related to the disease process or treatment will be avoided or minimized Outcome: Progressing Goal: Dialysis access will remain free of complications Outcome: Progressing   Problem: Activity: Goal: Activity intolerance will improve Outcome: Progressing   Problem: Fluid Volume: Goal: Fluid volume balance will be maintained or improved Outcome: Progressing   Problem: Nutritional: Goal: Ability to make appropriate dietary choices will improve Outcome: Progressing   Problem: Respiratory: Goal: Respiratory symptoms related to disease process will be avoided Outcome: Progressing   Problem: Self-Concept: Goal: Body image disturbance will be avoided or minimized Outcome: Progressing   Problem: Urinary Elimination: Goal: Progression of disease will be identified and treated Outcome: Progressing

## 2020-09-12 DIAGNOSIS — N179 Acute kidney failure, unspecified: Secondary | ICD-10-CM | POA: Diagnosis not present

## 2020-09-12 DIAGNOSIS — J90 Pleural effusion, not elsewhere classified: Secondary | ICD-10-CM | POA: Diagnosis not present

## 2020-09-12 DIAGNOSIS — F0280 Dementia in other diseases classified elsewhere without behavioral disturbance: Secondary | ICD-10-CM

## 2020-09-12 DIAGNOSIS — G301 Alzheimer's disease with late onset: Secondary | ICD-10-CM | POA: Diagnosis not present

## 2020-09-12 DIAGNOSIS — Z515 Encounter for palliative care: Secondary | ICD-10-CM | POA: Diagnosis not present

## 2020-09-12 MED ORDER — HYDROMORPHONE HCL 1 MG/ML IJ SOLN
0.2500 mg | INTRAMUSCULAR | Status: DC | PRN
  Administered 2020-09-15 – 2020-09-17 (×2): 0.25 mg via INTRAVENOUS
  Filled 2020-09-12 (×2): qty 1

## 2020-09-12 NOTE — Progress Notes (Signed)
PROGRESS NOTE    Wayne Fuller  [id number] DOB: January 13, 1924 DOA: 09/03/2020 PCP: [name], NP   Chief Complaint  Patient presents with  . Altered Mental Status  Brief Narrative: 4 male with significant history of advanced dementia, CKD stage IV, hypertension, PAF not on anticoagulation brought to the ED with shortness of breath for few days and SSI generalized weakness poor oral intake over the past week along with 2 days of nausea vomiting diarrhea due to viral illness that was going around in the skilled nursing facility between 1 to 2 weeks. Upon arrival to ED, he was hypoxic requiring oxygen and also had AKI on CKD stage IV with hyperkalemia.  COVID neg. he did have peaked T waves on EKG.  There was some concern about left lower lobe pneumonia on the chest x-ray for which he was started on Rocephin and Zithromax.  CT chest showed large left-sided pleural effusion.  He was admitted to hospital service.  IR was consulted and he underwent left thoracentesis with 1500 cc of urine.  Fluid analysis indicated transudative fluid.  Patient's oxygen demand improved.  He was then started on gentle IV hydration as his p.o. intake was not enough.  With that, his renal function started to improve as well.  He also developed acute on chronic anemia where his hemoglobin dropped to 6.1 on 09/05/2020 for which she received 1 unit of PRBC suspicion and since then his hemoglobin remained stable over 7.5.  Repeat chest x-ray was done on 09/08/2020 which once again showed large left-sided pleural effusion.  Another thoracentesis was done on 09/09/2020 with removal of 1.7 L of bloody fluid.  Palliative care has been following patient.  Repeat chest x-ray since last 2 days shows slow reaccumulation of the left pleural effusion again.  Palliative care has been following and after extensive discussion  patient/family opted to pursue comfort measures and Pleurx catheter placement.    Subjective: Patient resting  comfortably on room air alert awake not communicative. No acute events overnight.  Assessment & Plan:  Acute hypoxic respiratory failure due to large left-sided pleural effusion Possible left lower lobe community-acquired pneumonia Recurrent left sided pleural effusion Status postthoracentesis 4/'15 1 5 '$ 0 cc fluid removed indicating transudative fluid.  Repeat chest x-ray follows/19 with large left-sided pleural effusion underwent therapeutic Thoracentesis 4/21.7 L bloody fluid removed chest x-ray showing worsening pleural effusion although patient has been comfortable on room air.  Patient has completed 5 days of antibiotics on 4/20.  After palliative care discussion plan is for comfort measures and Pleurx catheter placement IR planning on Monday  AKI on CKD stage IV/mild metabolic acidosis-creatinine stable.  No further labs due to comfort measures. Hyperkalemia resolved Acute on chronic anemia in the setting of CKD status post 1 unit PRBC again dropping slowly last one 7.2 no further blood work. Advanced dementia/wheelchair-bound at baseline continue Seroquel, delirium precaution, supportive measures Essential hypertension BP is controlled continue home meds with amlodipine hydralazine Toprol PAF rate controlled on amiodarone Toprol aspirin Goals of care/DNR and now comfort measures with plan for Pleurx catheter Dysphagia on dysphagia 1 diet  Diet Order            Diet NPO time specified Except for: Sips with Meds  Diet effective midnight           DIET - DYS 1 Room service appropriate? Yes; Fluid consistency: Nectar Thick  Diet effective now  Patient's Body mass index is 22.92 kg/m. DVT prophylaxis: NONE for comfort Code Status:   Code Status: DNR  Family Communication: plan of care discussed with patient at bedside. Discussed with the nursing staff. Status is: Inpatient Remains inpatient appropriate because:Inpatient level of care appropriate due to severity of  illness and Awaiting for Pleurx catheter placement  Dispo: The patient is from: Memory care unit              Anticipated d/c is to: TBD              Patient currently is not medically stable to d/c.   Difficult to place patient No   Unresulted Labs (From admission, onward)          Start     Ordered   09/04/20 0822  Body fluid culture (includes gram stain)  (Thoracentesis Labs Panel)  Once,   R       Question:  Are there also cytology or pathology orders on this specimen?  Answer:  Yes   09/04/20 [id number]          Medications reviewed:  Scheduled Meds: . albuterol  4 puff Inhalation Once  . amiodarone  200 mg Oral Daily  . amLODipine  10 mg Oral Daily  . dextrose  1 ampule Intravenous Once  . hydrALAZINE  100 mg Oral TID  . insulin aspart  10 Units Intravenous Once  . ipratropium  2 puff Inhalation Once  . melatonin  3 mg Oral QHS  . metoprolol succinate  25 mg Oral Daily  . polyethylene glycol  17 g Oral Daily  . sodium bicarbonate  650 mg Oral BID  . sodium chloride  2 spray Each Nare Daily  . tamsulosin  0.4 mg Oral QHS   Continuous Infusions:  Consultants:see note  Procedures:see note  Antimicrobials: Anti-infectives (From admission, onward)   Start     Dose/Rate Route Frequency Ordered Stop   09/07/20 1000  azithromycin (ZITHROMAX) tablet 500 mg        500 mg Oral Daily 09/06/20 1156 09/07/20 0952   09/04/20 2200  cefTRIAXone (ROCEPHIN) 2 g in sodium chloride 0.9 % 100 mL IVPB        2 g 200 mL/hr over 30 Minutes Intravenous Every 24 hours 09/03/20 2312 09/09/20 2159   09/04/20 2200  azithromycin (ZITHROMAX) 500 mg in sodium chloride 0.9 % 250 mL IVPB  Status:  Discontinued        500 mg 250 mL/hr over 60 Minutes Intravenous Every 24 hours 09/03/20 2312 09/06/20 1156   09/04/20 1730  cefTRIAXone (ROCEPHIN) 1 g in sodium chloride 0.9 % 100 mL IVPB  Status:  Discontinued        1 g 200 mL/hr over 30 Minutes Intravenous Every 24 hours 09/04/20 1634 09/04/20 1643    09/04/20 1730  azithromycin (ZITHROMAX) 500 mg in sodium chloride 0.9 % 250 mL IVPB  Status:  Discontinued        500 mg 250 mL/hr over 60 Minutes Intravenous Every 24 hours 09/04/20 1634 09/04/20 1644   09/03/20 2230  cefTRIAXone (ROCEPHIN) 1 g in sodium chloride 0.9 % 100 mL IVPB        1 g 200 mL/hr over 30 Minutes Intravenous  Once 09/03/20 2229 09/03/20 2332   09/03/20 2230  azithromycin (ZITHROMAX) 500 mg in sodium chloride 0.9 % 250 mL IVPB        500 mg 250 mL/hr over 60 Minutes Intravenous  Once 09/03/20 2229  09/04/20 0148     Culture/Microbiology    Component Value Date/Time   SDES FLUID 09/04/2020 1156   SPECREQUEST LEFT THORACENTESIS 09/04/2020 1156   CULT  09/04/2020 1156    NO GROWTH 3 DAYS Performed at [facility], [place] [address], [place], [place] [address]    REPTSTATUS 09/08/2020 FINAL 09/04/2020 1156    Other culture-see note  Objective: Vitals: Today's Vitals   09/11/20 2100 09/12/20 0544 09/12/20 0744 09/12/20 1005  BP: (!) 158/54 (!) 154/58  (!) 156/59  Pulse: 66 66  68  Resp: 18 16    Temp: 99.2 F (37.3 C) 98.5 F (36.9 C)    TempSrc: Axillary Oral    SpO2: 96% 100% 96%   Weight:      Height:      PainSc:        Intake/Output Summary (Last 24 hours) at 09/12/2020 1103 Last data filed at 09/12/2020 1026 Gross per 24 hour  Intake 118 ml  Output 1450 ml  Net -1332 ml   Filed Weights   09/04/20 0315  Weight: 81 kg   Weight change:   Intake/Output from previous day: 04/22 0701 - 04/23 0700 In: -  Out: 1450 [Urine:1450] Intake/Output this shift: Total I/O In: 118 [P.O.:118] Out: -  Filed Weights   09/04/20 0315  Weight: 81 kg    Examination: General exam: Alert awake, not communicative or interactive ill looking frail, with dementia  HEENT:Oral mucosa moist, Ear/Nose WNL grossly,dentition normal. Respiratory system: Diminished breath sounds on the left lung, no use of accessory muscle Cardiovascular system: S1 & S2 +,  regular, No JVD. Gastrointestinal system: Abdomen soft, NT,ND, BS+. Nervous System:Alert, awake, demented/confused Extremities: No edema, distal peripheral pulses palpable.  Skin: No rashes,no icterus. MSK: thin muscle bulk,tone, power  Data Reviewed: I have personally reviewed following labs and imaging studies CBC: Recent Labs  Lab 09/07/20 0847 09/08/20 0354 09/09/20 0302 09/10/20 0200 09/11/20 0302  WBC 4.0 4.4 4.1 3.5* 3.6*  NEUTROABS 3.4 3.3 3.4 2.7 2.7  HGB 7.7* 8.2* 7.7* 7.5* 7.2*  HCT 25.4* 27.4* 26.9* 25.3* 24.8*  MCV 106.7* 106.2* 110.7* 108.1* 109.7*  PLT 196 190 194 187 [id number]   Basic Metabolic Panel: Recent Labs  Lab 09/06/20 0556 09/07/20 0847 09/08/20 0354 09/09/20 0302 09/10/20 0200 09/11/20 0302  NA 143 144 146* 146* 147* 149*  K 4.6 4.4 5.2* 4.8 4.8 5.0  CL 113* 118* 119* 120* 123* 122*  CO2 20* 21* 20* 19* 19* 20*  GLUCOSE 97 88 85 120* 88 84  BUN 51* 42* 44* 42* 38* 42*  CREATININE 3.68* 3.39* 3.18* 3.00* 2.97* 3.28*  CALCIUM 8.2* 8.0* 8.2* 8.2* 8.1* 8.3*  MG 2.1  --   --   --   --   --    GFR: Estimated Creatinine Clearance: 15.8 mL/min (A) (by C-G formula based on SCr of 3.28 mg/dL (H)). Liver Function Tests: No results for input(s): AST, ALT, ALKPHOS, BILITOT, PROT, ALBUMIN in the last 168 hours. No results for input(s): LIPASE, AMYLASE in the last 168 hours. No results for input(s): AMMONIA in the last 168 hours. Coagulation Profile: No results for input(s): INR, PROTIME in the last 168 hours. Cardiac Enzymes: No results for input(s): CKTOTAL, CKMB, CKMBINDEX, TROPONINI in the last 168 hours. BNP (last 3 results) No results for input(s): PROBNP in the last 8760 hours. HbA1C: No results for input(s): HGBA1C in the last 72 hours. CBG: Recent Labs  Lab 09/05/20 1611  GLUCAP 102*  Lipid Profile: No results for input(s): CHOL, HDL, LDLCALC, TRIG, CHOLHDL, LDLDIRECT in the last 72 hours. Thyroid Function Tests: No results for input(s):  TSH, T4TOTAL, FREET4, T3FREE, THYROIDAB in the last 72 hours. Anemia Panel: No results for input(s): VITAMINB12, FOLATE, FERRITIN, TIBC, IRON, RETICCTPCT in the last 72 hours. Sepsis Labs: No results for input(s): PROCALCITON, LATICACIDVEN in the last 168 hours.  Recent Results (from the past 240 hour(s))  Resp Panel by RT-PCR (Flu A&B, Covid) Nasopharyngeal Swab     Status: None   Collection Time: 09/03/20  8:28 PM   Specimen: Nasopharyngeal Swab; Nasopharyngeal(NP) swabs in vial transport medium  Result Value Ref Range Status   SARS Coronavirus 2 by RT PCR NEGATIVE NEGATIVE Final    Comment: (NOTE) SARS-CoV-2 target nucleic acids are NOT DETECTED.  The SARS-CoV-2 RNA is generally detectable in upper respiratory specimens during the acute phase of infection. The lowest concentration of SARS-CoV-2 viral copies this assay can detect is 138 copies/mL. A negative result does not preclude SARS-Cov-2 infection and should not be used as the sole basis for treatment or other patient management decisions. A negative result may occur with  improper specimen collection/handling, submission of specimen other than nasopharyngeal swab, presence of viral mutation(s) within the areas targeted by this assay, and inadequate number of viral copies(<138 copies/mL). A negative result must be combined with clinical observations, patient history, and epidemiological information. The expected result is Negative.  Fact Sheet for Patients:  EntrepreneurPulse.com.au  Fact Sheet for Healthcare Providers:  IncredibleEmployment.be  This test is no t yet approved or cleared by the [place] FDA and  has been authorized for detection and/or diagnosis of SARS-CoV-2 by FDA under an Emergency Use Authorization (EUA). This EUA will remain  in effect (meaning this test can be used) for the duration of the COVID-19 declaration under Section 564(b)(1) of the Act,  21 U.S.C.section 360bbb-3(b)(1), unless the authorization is terminated  or revoked sooner.       Influenza A by PCR NEGATIVE NEGATIVE Final   Influenza B by PCR NEGATIVE NEGATIVE Final    Comment: (NOTE) The Xpert Xpress SARS-CoV-2/FLU/RSV plus assay is intended as an aid in the diagnosis of influenza from Nasopharyngeal swab specimens and should not be used as a sole basis for treatment. Nasal washings and aspirates are unacceptable for Xpert Xpress SARS-CoV-2/FLU/RSV testing.  Fact Sheet for Patients: EntrepreneurPulse.com.au  Fact Sheet for Healthcare Providers: IncredibleEmployment.be  This test is not yet approved or cleared by the [place] FDA and has been authorized for detection and/or diagnosis of SARS-CoV-2 by FDA under an Emergency Use Authorization (EUA). This EUA will remain in effect (meaning this test can be used) for the duration of the COVID-19 declaration under Section 564(b)(1) of the Act, 21 U.S.C. section 360bbb-3(b)(1), unless the authorization is terminated or revoked.  Performed at [facility], [place] [address] [place], [place] [address]   Blood culture (routine x 2)     Status: None   Collection Time: 09/03/20 11:11 PM   Specimen: BLOOD RIGHT FOREARM  Result Value Ref Range Status   Specimen Description BLOOD RIGHT FOREARM  Final   Special Requests   Final    BOTTLES DRAWN AEROBIC AND ANAEROBIC Blood Culture adequate volume   Culture   Final    NO GROWTH 5 DAYS Performed at [facility], [place] [address] [place], [place] [address]    Report Status 09/08/2020 FINAL  Final  Blood culture (routine x 2)  Status: None   Collection Time: 09/03/20 11:23 PM   Specimen: BLOOD LEFT HAND  Result Value Ref Range Status   Specimen Description BLOOD LEFT HAND  Final   Special Requests   Final    BOTTLES DRAWN AEROBIC AND ANAEROBIC Blood Culture adequate volume   Culture   Final    NO GROWTH 5  DAYS Performed at [facility], [address] [place], [place] [address]    Report Status 09/09/2020 FINAL  Final  Body fluid culture w Gram Stain     Status: None   Collection Time: 09/04/20 11:56 AM   Specimen: Lung, Left; Pleural Fluid  Result Value Ref Range Status   Specimen Description FLUID  Final   Special Requests LEFT THORACENTESIS  Final   Gram Stain NO WBC SEEN NO ORGANISMS SEEN   Final   Culture   Final    NO GROWTH 3 DAYS Performed at [facility] [address], [place], [place] [address]    Report Status 09/08/2020 FINAL  Final  SARS CORONAVIRUS 2 (TAT 6-24 HRS) Nasopharyngeal Nasopharyngeal Swab     Status: None   Collection Time: 09/08/20  9:38 AM   Specimen: Nasopharyngeal Swab  Result Value Ref Range Status   SARS Coronavirus 2 NEGATIVE NEGATIVE Final    Comment: (NOTE) SARS-CoV-2 target nucleic acids are NOT DETECTED.  The SARS-CoV-2 RNA is generally detectable in upper and lower respiratory specimens during the acute phase of infection. Negative results do not preclude SARS-CoV-2 infection, do not rule out co-infections with other pathogens, and should not be used as the sole basis for treatment or other patient management decisions. Negative results must be combined with clinical observations, patient history, and epidemiological information. The expected result is Negative.  Fact Sheet for Patients: SugarRoll.be  Fact Sheet for Healthcare Providers: https://www.woods-mathews.com/  This test is not yet approved or cleared by the [place] FDA and  has been authorized for detection and/or diagnosis of SARS-CoV-2 by FDA under an Emergency Use Authorization (EUA). This EUA will remain  in effect (meaning this test can be used) for the duration of the COVID-19 declaration under Se ction 564(b)(1) of the Act, 21 U.S.C. section 360bbb-3(b)(1), unless the authorization is terminated or revoked  sooner.  Performed at [facility], [place] [address] [place], [place] [address]      Radiology Studies: DG CHEST PORT 1 VIEW  Result Date: 09/11/2020 CLINICAL DATA:  Pleural effusion EXAM: PORTABLE CHEST 1 VIEW COMPARISON:  09/10/2020 FINDINGS: Moderate layering left pleural effusion, grossly unchanged. Associated left lower lobe opacity, likely compressive atelectasis. Right lung is clear. No pneumothorax. Heart is normal in size. Cervical spine fixation hardware, incompletely visualized. IMPRESSION: Moderate layering left pleural effusion, grossly unchanged. Associated left lower lobe opacity, likely compressive atelectasis. Electronically Signed   By: Julian Hy M.D.   On: 09/11/2020 07:54     LOS: 9 days   Antonieta Pert, MD [facility]  09/12/2020, 11:03 AM

## 2020-09-12 NOTE — Progress Notes (Signed)
Daily Progress Note   Patient Name: Wayne Fuller       Date: 09/12/2020 DOB: July 13, 1923  Age: 87 y.o. MRN#: [id number] Attending Physician: Wayne Pert, MD Primary Care Physician: Wayne Chandler, NP Admit Date: 09/03/2020  Reason for Consultation/Follow-up: To discuss complex medical decision making related to patient's goals of care  Subjective: Visited patient at bedside with his son Wayne Fuller and DIL.  Then talked with HCPOA Wayne Fuller on the phone.   Patient eating today.  Enjoying taking in food and drink but aspirating quite a bit.  Had a large bowel movement yesterday afternoon after Kayexalate so I doubt potassium is too elevated at this point.  Patient is head bobbing quite a bit.  Wayne Fuller tells me that is new in the last few days and asks what it is from.  We discussed the possibility of uremia causing it and I committed to review his medications to determine if there is anything that may not be agreeing with him.   Assessment: Patient with stage IV CKD, severe anemia, advanced dementia, recurrent pleural effusion.  Waiting for Pleurx Cath placement Monday PM   Patient Profile/HPI:  87 y.o.malewith past medical history of dementia, hypertension, atrial fibrillation not on any anticoagulation, CKD stage 4admitted on 4/14/2022with breathing problems diagnosed with possible pneumonia and large left sided pleural effusion s/p thoracentesis 1500 cc removed.Also with worsening renal function.     Length of Stay: 9   Vital Signs: BP (!) 141/43 (BP Location: Left Arm)   Pulse 63   Temp 99.7 F (37.6 C)   Resp 19   Ht 6' 2.02" (1.88 m)   Wt 81 kg   SpO2 98%   BMI 22.92 kg/m  SpO2: SpO2: 98 % O2 Device: O2 Device: Room Air O2 Flow Rate: O2 Flow Rate (L/min): 3 L/min        Palliative Assessment/Data: 30%     Palliative Care Plan    Recommendations/Plan: Comfort Measures. Family visiting. Pleurx placement Monday PM when IR is able. Discharge with Hospice.  Will determine location likely on Monday.  Code Status:  DNR  Prognosis:  days to weeks based on renal function   Discharge Planning: To Be Determined  Care plan was discussed with HCPOA, Son  Thank you for allowing the Palliative Medicine Team to assist in the care  of this patient.  Total time spent:  35 min.     Greater than 50%  of this time was spent counseling and coordinating care related to the above assessment and plan.  Florentina Jenny, PA-C Palliative Medicine  Please contact Palliative MedicineTeam phone at [phone] for questions and concerns between 7 am - 7 pm.   Please see AMION for individual provider pager numbers.

## 2020-09-12 NOTE — Plan of Care (Signed)
?  Problem: Education: ?Goal: Knowledge of disease and its progression will improve ?Outcome: Progressing ?  ?Problem: Health Behavior/Discharge Planning: ?Goal: Ability to manage health-related needs will improve ?Outcome: Progressing ?  ?Problem: Clinical Measurements: ?Goal: Complications related to the disease process or treatment will be avoided or minimized ?Outcome: Progressing ?Goal: Dialysis access will remain free of complications ?Outcome: Progressing ?  ?Problem: Activity: ?Goal: Activity intolerance will improve ?Outcome: Progressing ?  ?Problem: Fluid Volume: ?Goal: Fluid volume balance will be maintained or improved ?Outcome: Progressing ?  ?Problem: Nutritional: ?Goal: Ability to make appropriate dietary choices will improve ?Outcome: Progressing ?  ?Problem: Respiratory: ?Goal: Respiratory symptoms related to disease process will be avoided ?Outcome: Progressing ?  ?Problem: Self-Concept: ?Goal: Body image disturbance will be avoided or minimized ?Outcome: Progressing ?  ?Problem: Urinary Elimination: ?Goal: Progression of disease will be identified and treated ?Outcome: Progressing ?  ?

## 2020-09-13 DIAGNOSIS — I10 Essential (primary) hypertension: Secondary | ICD-10-CM | POA: Diagnosis not present

## 2020-09-13 NOTE — Progress Notes (Signed)
PROGRESS NOTE    Wayne Fuller  [id number] DOB: 01/26/1924 DOA: 09/03/2020 PCP: [name], NP   Chief Complaint  Patient presents with  . Altered Mental Status  Brief Narrative: 18 male with significant history of advanced dementia, CKD stage IV, hypertension, PAF not on anticoagulation brought to the ED with shortness of breath for few days and SSI generalized weakness poor oral intake over the past week along with 2 days of nausea vomiting diarrhea due to viral illness that was going around in the skilled nursing facility between 1 to 2 weeks. Upon arrival to ED, he was hypoxic requiring oxygen and also had AKI on CKD stage IV with hyperkalemia.  COVID neg. he did have peaked T waves on EKG.  There was some concern about left lower lobe pneumonia on the chest x-ray for which he was started on Rocephin and Zithromax.  CT chest showed large left-sided pleural effusion.  He was admitted to hospital service.  IR was consulted and he underwent left thoracentesis with 1500 cc of urine.  Fluid analysis indicated transudative fluid.  Patient's oxygen demand improved.  He was then started on gentle IV hydration as his p.o. intake was not enough.  With that, his renal function started to improve as well.  He also developed acute on chronic anemia where his hemoglobin dropped to 6.1 on 09/05/2020 for which she received 1 unit of PRBC suspicion and since then his hemoglobin remained stable over 7.5.  Repeat chest x-ray was done on 09/08/2020 which once again showed large left-sided pleural effusion.  Another thoracentesis was done on 09/09/2020 with removal of 1.7 L of bloody fluid.  Palliative care has been following patient.  Repeat chest x-ray since last 2 days shows slow reaccumulation of the left pleural effusion again.  Palliative care has been following and after extensive discussion  patient/family opted to pursue comfort measures and Pleurx catheter placement.    Subjective: Seen and examined  this morning. No acute events overnight. He was resting comfortably on room air. His head started to get more shaky on talking/moving  otherwise when I walked in he was calm and head was still.  Assessment & Plan:  Acute hypoxic respiratory failure due to large left-sided pleural effusion Possible left lower lobe community-acquired pneumonia Recurrent left sided pleural effusion Status postthoracentesis 4/'15 1 5 '$ 0 cc fluid removed indicating transudative fluid.  Repeat chest x-ray follows/19 with large left-sided pleural effusion underwent therapeutic Thoracentesis 4/21.7 L bloody fluid removed chest x-ray showing worsening pleural effusion although patient has been comfortable on room air.  Patient has completed 5 days of antibiotics on 4/20.  After palliative care discussion plan is for comfort measures and Pleurx catheter placement IR planning on Monday  AKI on CKD stage IV/mild metabolic acidosis-creatinine stable.  No further labs due to comfort measures. Hyperkalemia resolved Acute on chronic anemia in the setting of CKD status post 1 unit PRBC again dropping slowly last one 7.2 no further blood work. Advanced dementia/wheelchair-bound at baseline continue Seroquel, delirium precaution, supportive measures Essential hypertension BP is controlled continue home meds with amlodipine hydralazine Toprol PAF rate controlled on amiodarone Toprol aspirin Goals of care/DNR and now comfort measures with plan for Pleurx catheter Dysphagia on dysphagia 1 diet Head movement question related to uremia/tremor appears to be more with activity talking  Diet Order            Diet NPO time specified Except for: Sips with Meds  Diet effective midnight  DIET - DYS 1 Room service appropriate? Yes; Fluid consistency: Nectar Thick  Diet effective now                 Patient's Body mass index is 22.92 kg/m. DVT prophylaxis: NONE for comfort Code Status:   Code Status: DNR  Family  Communication: plan of care discussed with patient at bedside. Discussed with the nursing staff. Status is: Inpatient Remains inpatient appropriate because:Inpatient level of care appropriate due to severity of illness and Awaiting for Pleurx catheter placement  Dispo: The patient is from: Memory care unit              Anticipated d/c is to: Hospice- TBD after he has a procedure on Monday              Patient currently is not medically stable to d/c.   Difficult to place patient No   Unresulted Labs (From admission, onward)          Start     Ordered   09/04/20 0822  Body fluid culture (includes gram stain)  (Thoracentesis Labs Panel)  Once,   R       Question:  Are there also cytology or pathology orders on this specimen?  Answer:  Yes   09/04/20 [id number]          Medications reviewed:  Scheduled Meds: . albuterol  4 puff Inhalation Once  . amiodarone  200 mg Oral Daily  . amLODipine  10 mg Oral Daily  . dextrose  1 ampule Intravenous Once  . hydrALAZINE  100 mg Oral TID  . insulin aspart  10 Units Intravenous Once  . ipratropium  2 puff Inhalation Once  . melatonin  3 mg Oral QHS  . metoprolol succinate  25 mg Oral Daily  . polyethylene glycol  17 g Oral Daily  . sodium bicarbonate  650 mg Oral BID  . sodium chloride  2 spray Each Nare Daily  . tamsulosin  0.4 mg Oral QHS   Continuous Infusions:  Consultants:see note  Procedures:see note  Antimicrobials: Anti-infectives (From admission, onward)   Start     Dose/Rate Route Frequency Ordered Stop   09/07/20 1000  azithromycin (ZITHROMAX) tablet 500 mg        500 mg Oral Daily 09/06/20 1156 09/07/20 0952   09/04/20 2200  cefTRIAXone (ROCEPHIN) 2 g in sodium chloride 0.9 % 100 mL IVPB        2 g 200 mL/hr over 30 Minutes Intravenous Every 24 hours 09/03/20 2312 09/09/20 2159   09/04/20 2200  azithromycin (ZITHROMAX) 500 mg in sodium chloride 0.9 % 250 mL IVPB  Status:  Discontinued        500 mg 250 mL/hr over 60  Minutes Intravenous Every 24 hours 09/03/20 2312 09/06/20 1156   09/04/20 1730  cefTRIAXone (ROCEPHIN) 1 g in sodium chloride 0.9 % 100 mL IVPB  Status:  Discontinued        1 g 200 mL/hr over 30 Minutes Intravenous Every 24 hours 09/04/20 1634 09/04/20 1643   09/04/20 1730  azithromycin (ZITHROMAX) 500 mg in sodium chloride 0.9 % 250 mL IVPB  Status:  Discontinued        500 mg 250 mL/hr over 60 Minutes Intravenous Every 24 hours 09/04/20 1634 09/04/20 1644   09/03/20 2230  cefTRIAXone (ROCEPHIN) 1 g in sodium chloride 0.9 % 100 mL IVPB        1 g 200 mL/hr over 30 Minutes Intravenous  Once  09/03/20 2229 09/03/20 2332   09/03/20 2230  azithromycin (ZITHROMAX) 500 mg in sodium chloride 0.9 % 250 mL IVPB        500 mg 250 mL/hr over 60 Minutes Intravenous  Once 09/03/20 2229 09/04/20 0148     Culture/Microbiology    Component Value Date/Time   SDES FLUID 09/04/2020 1156   SPECREQUEST LEFT THORACENTESIS 09/04/2020 1156   CULT  09/04/2020 1156    NO GROWTH 3 DAYS Performed at [facility], [place] [address], [place], [place] [address]    REPTSTATUS 09/08/2020 FINAL 09/04/2020 1156    Other culture-see note  Objective: Vitals: Today's Vitals   09/12/20 1005 09/12/20 1425 09/12/20 2143 09/13/20 0533  BP: (!) 156/59 (!) 141/43 (!) 139/45 (!) 152/55  Pulse: 68 63 66 63  Resp:  '19 20 20  '$ Temp:  99.7 F (37.6 C) 98.8 F (37.1 C) 98.7 F (37.1 C)  TempSrc:   Oral Oral  SpO2:  98% 94% 98%  Weight:      Height:      PainSc:        Intake/Output Summary (Last 24 hours) at 09/13/2020 0847 Last data filed at 09/13/2020 0500 Gross per 24 hour  Intake 358 ml  Output 1050 ml  Net -692 ml   Filed Weights   09/04/20 0315  Weight: 81 kg   Weight change:   Intake/Output from previous day: 04/23 0701 - 04/24 0700 In: 358 [P.O.:358] Out: 1050 [Urine:1050] Intake/Output this shift: No intake/output data recorded. Filed Weights   09/04/20 0315  Weight: 81 kg     Examination: General exam: Alert awake responds with nods able to move his extremities HEENT:Oral mucosa moist, Ear/Nose WNL grossly, dentition normal. Respiratory system: bilaterally air entry present with diminished on the left,no wheezing or crackles,no use of accessory muscle Cardiovascular system: S1 & S2 +, No JVD,. Gastrointestinal system: Abdomen soft, NT,ND, BS+ Nervous System:Alert, awake, head tremors getting worse with activity/speech Extremities: No edema, distal peripheral pulses palpable.  Skin: No rashes,no icterus. MSK: Normal muscle bulk,tone, power  Data Reviewed: I have personally reviewed following labs and imaging studies CBC: Recent Labs  Lab 09/07/20 0847 09/08/20 0354 09/09/20 0302 09/10/20 0200 09/11/20 0302  WBC 4.0 4.4 4.1 3.5* 3.6*  NEUTROABS 3.4 3.3 3.4 2.7 2.7  HGB 7.7* 8.2* 7.7* 7.5* 7.2*  HCT 25.4* 27.4* 26.9* 25.3* 24.8*  MCV 106.7* 106.2* 110.7* 108.1* 109.7*  PLT 196 190 194 187 [id number]   Basic Metabolic Panel: Recent Labs  Lab 09/07/20 0847 09/08/20 0354 09/09/20 0302 09/10/20 0200 09/11/20 0302  NA 144 146* 146* 147* 149*  K 4.4 5.2* 4.8 4.8 5.0  CL 118* 119* 120* 123* 122*  CO2 21* 20* 19* 19* 20*  GLUCOSE 88 85 120* 88 84  BUN 42* 44* 42* 38* 42*  CREATININE 3.39* 3.18* 3.00* 2.97* 3.28*  CALCIUM 8.0* 8.2* 8.2* 8.1* 8.3*   GFR: Estimated Creatinine Clearance: 15.8 mL/min (A) (by C-G formula based on SCr of 3.28 mg/dL (H)). Liver Function Tests: No results for input(s): AST, ALT, ALKPHOS, BILITOT, PROT, ALBUMIN in the last 168 hours. No results for input(s): LIPASE, AMYLASE in the last 168 hours. No results for input(s): AMMONIA in the last 168 hours. Coagulation Profile: No results for input(s): INR, PROTIME in the last 168 hours. Cardiac Enzymes: No results for input(s): CKTOTAL, CKMB, CKMBINDEX, TROPONINI in the last 168 hours. BNP (last 3 results) No results for input(s): PROBNP in the last 8760 hours. HbA1C: No  results for input(s): HGBA1C in the last 72 hours. CBG: No results for input(s): GLUCAP in the last 168 hours. Lipid Profile: No results for input(s): CHOL, HDL, LDLCALC, TRIG, CHOLHDL, LDLDIRECT in the last 72 hours. Thyroid Function Tests: No results for input(s): TSH, T4TOTAL, FREET4, T3FREE, THYROIDAB in the last 72 hours. Anemia Panel: No results for input(s): VITAMINB12, FOLATE, FERRITIN, TIBC, IRON, RETICCTPCT in the last 72 hours. Sepsis Labs: No results for input(s): PROCALCITON, LATICACIDVEN in the last 168 hours.  Recent Results (from the past 240 hour(s))  Resp Panel by RT-PCR (Flu A&B, Covid) Nasopharyngeal Swab     Status: None   Collection Time: 09/03/20  8:28 PM   Specimen: Nasopharyngeal Swab; Nasopharyngeal(NP) swabs in vial transport medium  Result Value Ref Range Status   SARS Coronavirus 2 by RT PCR NEGATIVE NEGATIVE Final    Comment: (NOTE) SARS-CoV-2 target nucleic acids are NOT DETECTED.  The SARS-CoV-2 RNA is generally detectable in upper respiratory specimens during the acute phase of infection. The lowest concentration of SARS-CoV-2 viral copies this assay can detect is 138 copies/mL. A negative result does not preclude SARS-Cov-2 infection and should not be used as the sole basis for treatment or other patient management decisions. A negative result may occur with  improper specimen collection/handling, submission of specimen other than nasopharyngeal swab, presence of viral mutation(s) within the areas targeted by this assay, and inadequate number of viral copies(<138 copies/mL). A negative result must be combined with clinical observations, patient history, and epidemiological information. The expected result is Negative.  Fact Sheet for Patients:  EntrepreneurPulse.com.au  Fact Sheet for Healthcare Providers:  IncredibleEmployment.be  This test is no t yet approved or cleared by the [place] FDA and  has  been authorized for detection and/or diagnosis of SARS-CoV-2 by FDA under an Emergency Use Authorization (EUA). This EUA will remain  in effect (meaning this test can be used) for the duration of the COVID-19 declaration under Section 564(b)(1) of the Act, 21 U.S.C.section 360bbb-3(b)(1), unless the authorization is terminated  or revoked sooner.       Influenza A by PCR NEGATIVE NEGATIVE Final   Influenza B by PCR NEGATIVE NEGATIVE Final    Comment: (NOTE) The Xpert Xpress SARS-CoV-2/FLU/RSV plus assay is intended as an aid in the diagnosis of influenza from Nasopharyngeal swab specimens and should not be used as a sole basis for treatment. Nasal washings and aspirates are unacceptable for Xpert Xpress SARS-CoV-2/FLU/RSV testing.  Fact Sheet for Patients: EntrepreneurPulse.com.au  Fact Sheet for Healthcare Providers: IncredibleEmployment.be  This test is not yet approved or cleared by the [place] FDA and has been authorized for detection and/or diagnosis of SARS-CoV-2 by FDA under an Emergency Use Authorization (EUA). This EUA will remain in effect (meaning this test can be used) for the duration of the COVID-19 declaration under Section 564(b)(1) of the Act, 21 U.S.C. section 360bbb-3(b)(1), unless the authorization is terminated or revoked.  Performed at [facility], [place] [address], [place], [place] [address]   Blood culture (routine x 2)     Status: None   Collection Time: 09/03/20 11:11 PM   Specimen: BLOOD RIGHT FOREARM  Result Value Ref Range Status   Specimen Description BLOOD RIGHT FOREARM  Final   Special Requests   Final    BOTTLES DRAWN AEROBIC AND ANAEROBIC Blood Culture adequate volume   Culture   Final    NO GROWTH 5 DAYS Performed at [facility], [place] [address],  [place], [place] [address]    Report Status 09/08/2020 FINAL  Final  Blood culture (routine x 2)     Status: None   Collection Time:  09/03/20 11:23 PM   Specimen: BLOOD LEFT HAND  Result Value Ref Range Status   Specimen Description BLOOD LEFT HAND  Final   Special Requests   Final    BOTTLES DRAWN AEROBIC AND ANAEROBIC Blood Culture adequate volume   Culture   Final    NO GROWTH 5 DAYS Performed at [facility], [place] [address], [place], [place] [address]    Report Status 09/09/2020 FINAL  Final  Body fluid culture w Gram Stain     Status: None   Collection Time: 09/04/20 11:56 AM   Specimen: Lung, Left; Pleural Fluid  Result Value Ref Range Status   Specimen Description FLUID  Final   Special Requests LEFT THORACENTESIS  Final   Gram Stain NO WBC SEEN NO ORGANISMS SEEN   Final   Culture   Final    NO GROWTH 3 DAYS Performed at [place] [address] [place], [place] [address]    Report Status 09/08/2020 FINAL  Final  SARS CORONAVIRUS 2 (TAT 6-24 HRS) Nasopharyngeal Nasopharyngeal Swab     Status: None   Collection Time: 09/08/20  9:38 AM   Specimen: Nasopharyngeal Swab  Result Value Ref Range Status   SARS Coronavirus 2 NEGATIVE NEGATIVE Final    Comment: (NOTE) SARS-CoV-2 target nucleic acids are NOT DETECTED.  The SARS-CoV-2 RNA is generally detectable in upper and lower respiratory specimens during the acute phase of infection. Negative results do not preclude SARS-CoV-2 infection, do not rule out co-infections with other pathogens, and should not be used as the sole basis for treatment or other patient management decisions. Negative results must be combined with clinical observations, patient history, and epidemiological information. The expected result is Negative.  Fact Sheet for Patients: SugarRoll.be  Fact Sheet for Healthcare Providers: https://www.woods-mathews.com/  This test is not yet approved or cleared by the [place] FDA and  has been authorized for detection and/or diagnosis of SARS-CoV-2 by FDA under an Emergency  Use Authorization (EUA). This EUA will remain  in effect (meaning this test can be used) for the duration of the COVID-19 declaration under Se ction 564(b)(1) of the Act, 21 U.S.C. section 360bbb-3(b)(1), unless the authorization is terminated or revoked sooner.  Performed at [facility], [place] [address] [place], [place] [address]      Radiology Studies: No results found.   LOS: 10 days   Antonieta Pert, MD [facility]  09/13/2020, 8:47 AM

## 2020-09-14 ENCOUNTER — Inpatient Hospital Stay (HOSPITAL_COMMUNITY): Payer: Medicare Other

## 2020-09-14 DIAGNOSIS — G301 Alzheimer's disease with late onset: Secondary | ICD-10-CM | POA: Diagnosis not present

## 2020-09-14 DIAGNOSIS — N184 Chronic kidney disease, stage 4 (severe): Secondary | ICD-10-CM | POA: Diagnosis not present

## 2020-09-14 DIAGNOSIS — F0281 Dementia in other diseases classified elsewhere with behavioral disturbance: Secondary | ICD-10-CM | POA: Diagnosis not present

## 2020-09-14 DIAGNOSIS — J90 Pleural effusion, not elsewhere classified: Secondary | ICD-10-CM | POA: Diagnosis not present

## 2020-09-14 DIAGNOSIS — N179 Acute kidney failure, unspecified: Secondary | ICD-10-CM | POA: Diagnosis not present

## 2020-09-14 HISTORY — PX: IR PERC PLEURAL DRAIN W/INDWELL CATH W/IMG GUIDE: IMG5383

## 2020-09-14 LAB — BASIC METABOLIC PANEL
Anion gap: 12 (ref 5–15)
BUN: 48 mg/dL — ABNORMAL HIGH (ref 8–23)
CO2: 19 mmol/L — ABNORMAL LOW (ref 22–32)
Calcium: 8.4 mg/dL — ABNORMAL LOW (ref 8.9–10.3)
Chloride: 121 mmol/L — ABNORMAL HIGH (ref 98–111)
Creatinine, Ser: 3.35 mg/dL — ABNORMAL HIGH (ref 0.61–1.24)
GFR, Estimated: 16 mL/min — ABNORMAL LOW (ref >60)
Glucose, Bld: 138 mg/dL — ABNORMAL HIGH (ref 70–99)
Potassium: 6 mmol/L — ABNORMAL HIGH (ref 3.5–5.1)
Sodium: 152 mmol/L — ABNORMAL HIGH (ref 135–145)

## 2020-09-14 MED ORDER — FENTANYL CITRATE (PF) 100 MCG/2ML IJ SOLN
INTRAMUSCULAR | Status: AC
  Filled 2020-09-14: qty 2

## 2020-09-14 MED ORDER — LIDOCAINE HCL 1 % IJ SOLN
INTRAMUSCULAR | Status: AC | PRN
  Administered 2020-09-14: 10 mL

## 2020-09-14 MED ORDER — MIDAZOLAM HCL 2 MG/2ML IJ SOLN
INTRAMUSCULAR | Status: AC | PRN
  Administered 2020-09-14: 1 mg via INTRAVENOUS
  Administered 2020-09-14: 0.5 mg via INTRAVENOUS

## 2020-09-14 MED ORDER — SODIUM CHLORIDE 0.9 % IV SOLN
INTRAVENOUS | Status: AC | PRN
  Administered 2020-09-14: 250 mL via INTRAVENOUS

## 2020-09-14 MED ORDER — LIDOCAINE HCL 1 % IJ SOLN
INTRAMUSCULAR | Status: AC
  Filled 2020-09-14: qty 20

## 2020-09-14 MED ORDER — MIDAZOLAM HCL 2 MG/2ML IJ SOLN
INTRAMUSCULAR | Status: AC
  Filled 2020-09-14: qty 2

## 2020-09-14 MED ORDER — FENTANYL CITRATE (PF) 100 MCG/2ML IJ SOLN
INTRAMUSCULAR | Status: AC | PRN
  Administered 2020-09-14 (×2): 25 ug via INTRAVENOUS

## 2020-09-14 NOTE — Procedures (Signed)
Interventional Radiology Procedure Note  Procedure: Left chest pleurX   Indication: Recurrent pleural effusion  Findings: Please refer to procedural dictation for full description.  Complications: None  EBL: < 10 mL  Wayne Roux, MD [phone]  ;

## 2020-09-14 NOTE — Progress Notes (Signed)
Manufacturing engineer [facility])  Received request from [facility] for hospice services at home after discharge.  Chart and pt information under review by [facility] physician.  Hospice eligibility pending at this time.  Hospital liaison spoke with pt's daughter Claudine Mouton and son Glendell Docker to initiate education related to hospice philosophy and services and to answer any questions at this time.  Both verbalized understanding of information given.    [place] send signed and completed DNR home with pt/family.  Please provide prescriptions at discharge as needed to ensure ongoing symptom management until pt can be admitted onto hospice services.    DME needs discussed. Hospital bed and overbed table will be ordered once it's confirmed that pt will be returning to [facility].   ACC information and contact numbers given to [place].  Above information shared with Arma Heading Manager.  Please call with any questions or concerns.  Thank you for the opportunity to participate in this pt's care.  Domenic Moras, BSN, RN [facility] [phone] (24h on call)

## 2020-09-14 NOTE — Progress Notes (Addendum)
Daily Progress Note   Patient Name: Wayne Fuller       Date: 09/14/2020 DOB: [birth year]  Age: 87 y.o. MRN#: [id number] Attending Physician: Antonieta Pert, MD Primary Care Physician: [name], NP Admit Date: 09/03/2020  Reason for Consultation/Follow-up:  To discuss complex medical decision making related to patient's goals of care  Subjective:  Met with patient, daughter - Wayne Fuller and son - Wayne Fuller in patient's room.  We discussed the head bobbing which has started since admission.  It does not seem to bother [name], but it is bothersome to his loved ones.   Could consider treatment but remedies (carbidopa-levidopa) may be contra indicated with his renal failure.  Head bobbing may be caused by renal failure.  Family would like 1 set of labs just to get a feel for patient's prognosis.    I ordered a bmet this morning but it was not drawn.    Discussed options for disposition.  Patient is doing to well for [facility].   We discussed previous ALF/Memory care with Hospice vs SNF or long term care with Hospice.   Family decided they felt better about him going back to a familiar environment with Hospice care.    Assessment:  Patient awake/alert.  Head bobbing.  Mumbles unintelligibly.  Has been eating well.    Patient Profile/HPI:  87 y.o.malewith past medical history of dementia, hypertension, atrial fibrillation not on any anticoagulation, CKD stage 4admitted on 4/14/2022with breathing problems diagnosed with possible pneumonia and large left sided pleural effusion s/p thoracentesis 1500 cc removed.Also with worsening renal function.   Length of Stay: 11   Vital Signs: BP (!) 144/50   Pulse (!) 54   Temp 98.7 F (37.1 C) (Axillary)   Resp 16   Ht 6' 2.02" (1.88 m)   Wt 81  kg   SpO2 99%   BMI 22.92 kg/m  SpO2: SpO2: 99 % O2 Device: O2 Device: Room Air O2 Flow Rate: O2 Flow Rate (L/min): 2 L/min       Palliative Assessment/Data:  30%   Eats well     Palliative Care Plan    Recommendations/Plan: Pleurx Cath Placed.  Hospice to care for Pleurx cath at [facility]. Family hopeful for DC to ALF/Memory care with [facility] Hospice support. Will re-order bmet for prognosis - do  not intend to treat.  Code Status:  DNR  Prognosis:  < 6 months secondary to rapid decline, renal failure, in the setting of dementia.   Discharge Planning: Hopeful for ALF with Hospice  Care plan was discussed with HCPOA, [place]  Thank you for allowing the Palliative Medicine Team to assist in the care of this patient.  Total time spent:  35 min.     Greater than 50%  of this time was spent counseling and coordinating care related to the above assessment and plan.  Florentina Jenny, PA-C Palliative Medicine  Please contact Palliative MedicineTeam phone at [phone] for questions and concerns between 7 am - 7 pm.   Please see AMION for individual provider pager numbers.

## 2020-09-14 NOTE — Progress Notes (Signed)
PROGRESS NOTE    Wayne Fuller  [id number] DOB: [birth year] DOA: 09/03/2020 PCP: [name], NP   Chief Complaint  Patient presents with  . Altered Mental Status  Brief Narrative: 6 male with significant history of advanced dementia, CKD stage IV, hypertension, PAF not on anticoagulation brought to the ED with shortness of breath for few days and SSI generalized weakness poor oral intake over the past week along with 2 days of nausea vomiting diarrhea due to viral illness that was going around in the skilled nursing facility between 1 to 2 weeks. Upon arrival to ED, he was hypoxic requiring oxygen and also had AKI on CKD stage IV with hyperkalemia.  COVID neg. he did have peaked T waves on EKG.  There was some concern about left lower lobe pneumonia on the chest x-ray for which he was started on Rocephin and Zithromax.  CT chest showed large left-sided pleural effusion.  He was admitted to hospital service.  IR was consulted and he underwent left thoracentesis with 1500 cc of urine.  Fluid analysis indicated transudative fluid.  Patient's oxygen demand improved.  He was then started on gentle IV hydration as his p.o. intake was not enough.  With that, his renal function started to improve as well.  He also developed acute on chronic anemia where his hemoglobin dropped to 6.1 on 09/05/2020 for which she received 1 unit of PRBC suspicion and since then his hemoglobin remained stable over 7.5.  Repeat chest x-ray was done on 09/08/2020 which once again showed large left-sided pleural effusion.  Another thoracentesis was done on 09/09/2020 with removal of 1.7 L of bloody fluid.  Palliative care has been following patient.  Repeat chest x-ray since last 2 days shows slow reaccumulation of the left pleural effusion again.  Palliative care has been following and after extensive discussion  patient/family opted to pursue comfort measures and Pleurx catheter placement.    Subjective: Seen this morning.   On room air.  Patient is alert trying to speak saying ahh. Head shaky more with talking, but not in distress  Assessment & Plan:  Acute hypoxic respiratory failure due to large left-sided pleural effusion Possible left lower lobe community-acquired pneumonia Recurrent left sided pleural effusion Status post thoracentesis x2 with reaccumulation.  Initially transudative fluid.  Patient completed 5 days of antibiotics on 4/20. After palliative care discussion plan is for comfort measures and Pleurx catheter placement by IR planning 4/25.    AKI on CKD stage IV/mild metabolic acidosis-last creatinine was at 3.2/22.  Currently on comfort measures.  No labs Recent Labs  Lab 09/08/20 0354 09/09/20 0302 09/10/20 0200 09/11/20 0302  BUN 44* 42* 38* 42*  CREATININE 3.18* 3.00* 2.97* 3.28*   Hyperkalemia resolved Acute on chronic anemia in the setting of CKD status post 1 unit PRBC again dropping slowly last one 7.2 no further blood work. Advanced dementia/wheelchair-bound at baseline continue Seroquel, delirium precaution, supportive measures Essential hypertension BP is controlled continue home meds with amlodipine hydralazine Toprol PAF rate controlled on amiodarone Toprol aspirin Goals of care/DNR and now comfort measures with plan for Pleurx catheter Dysphagia on dysphagia 1 diet Head movement question related to uremia/tremor appears to be more with activity talking  Diet Order            Diet NPO time specified Except for: Sips with Meds  Diet effective midnight                 Patient's Body mass index  is 22.92 kg/m. DVT prophylaxis: NONE for comfort Code Status:   Code Status: DNR  Family Communication: plan of care discussed with patient at bedside. Discussed with the nursing staff. Status is: Inpatient Remains inpatient appropriate because:Inpatient level of care appropriate due to severity of illness and Awaiting for Pleurx catheter placement  Dispo: The patient is  from: Memory care unit              Anticipated d/c is to: Hospice- TBD after he has a procedure.              Patient currently is not medically stable to d/c.   Difficult to place patient No   Unresulted Labs (From admission, onward)          Start     Ordered   09/04/20 0822  Body fluid culture (includes gram stain)  (Thoracentesis Labs Panel)  Once,   R       Question:  Are there also cytology or pathology orders on this specimen?  Answer:  Yes   09/04/20 [id number]   Pending  Basic metabolic panel  Once,   R       Question:  Specimen collection method  Answer:  Lab=Lab collect   Pending          Medications reviewed:  Scheduled Meds: . albuterol  4 puff Inhalation Once  . amiodarone  200 mg Oral Daily  . amLODipine  10 mg Oral Daily  . dextrose  1 ampule Intravenous Once  . hydrALAZINE  100 mg Oral TID  . insulin aspart  10 Units Intravenous Once  . ipratropium  2 puff Inhalation Once  . melatonin  3 mg Oral QHS  . metoprolol succinate  25 mg Oral Daily  . polyethylene glycol  17 g Oral Daily  . sodium bicarbonate  650 mg Oral BID  . sodium chloride  2 spray Each Nare Daily  . tamsulosin  0.4 mg Oral QHS   Continuous Infusions:  Consultants:see note  Procedures:see note  Antimicrobials: Anti-infectives (From admission, onward)   Start     Dose/Rate Route Frequency Ordered Stop   09/07/20 1000  azithromycin (ZITHROMAX) tablet 500 mg        500 mg Oral Daily 09/06/20 1156 09/07/20 0952   09/04/20 2200  cefTRIAXone (ROCEPHIN) 2 g in sodium chloride 0.9 % 100 mL IVPB        2 g 200 mL/hr over 30 Minutes Intravenous Every 24 hours 09/03/20 2312 09/09/20 2159   09/04/20 2200  azithromycin (ZITHROMAX) 500 mg in sodium chloride 0.9 % 250 mL IVPB  Status:  Discontinued        500 mg 250 mL/hr over 60 Minutes Intravenous Every 24 hours 09/03/20 2312 09/06/20 1156   09/04/20 1730  cefTRIAXone (ROCEPHIN) 1 g in sodium chloride 0.9 % 100 mL IVPB  Status:  Discontinued        1  g 200 mL/hr over 30 Minutes Intravenous Every 24 hours 09/04/20 1634 09/04/20 1643   09/04/20 1730  azithromycin (ZITHROMAX) 500 mg in sodium chloride 0.9 % 250 mL IVPB  Status:  Discontinued        500 mg 250 mL/hr over 60 Minutes Intravenous Every 24 hours 09/04/20 1634 09/04/20 1644   09/03/20 2230  cefTRIAXone (ROCEPHIN) 1 g in sodium chloride 0.9 % 100 mL IVPB        1 g 200 mL/hr over 30 Minutes Intravenous  Once 09/03/20 2229 09/03/20 2332   09/03/20  2230  azithromycin (ZITHROMAX) 500 mg in sodium chloride 0.9 % 250 mL IVPB        500 mg 250 mL/hr over 60 Minutes Intravenous  Once 09/03/20 2229 09/04/20 0148     Culture/Microbiology    Component Value Date/Time   SDES FLUID 09/04/2020 1156   SPECREQUEST LEFT THORACENTESIS 09/04/2020 1156   CULT  09/04/2020 1156    NO GROWTH 3 DAYS Performed at [facility], [place] [address], [place], [place] [address]    REPTSTATUS 09/08/2020 FINAL 09/04/2020 1156    Other culture-see note  Objective: Vitals: Today's Vitals   09/13/20 2352 09/14/20 0510 09/14/20 1016 09/14/20 1020  BP: 125/63 (!) 159/62  (!) 165/65  Pulse: (!) 59 66  90  Resp: '17 20  16  '$ Temp: 98.1 F (36.7 C) 98.1 F (36.7 C)    TempSrc:      SpO2:  98%  100%  Weight:      Height:      PainSc:   0-No pain     Intake/Output Summary (Last 24 hours) at 09/14/2020 1103 Last data filed at 09/13/2020 2100 Gross per 24 hour  Intake 120 ml  Output 875 ml  Net -755 ml   Filed Weights   09/04/20 0315  Weight: 81 kg   Weight change:   Intake/Output from previous day: 04/24 0701 - 04/25 0700 In: 120 [P.O.:120] Out: 875 [Urine:875] Intake/Output this shift: No intake/output data recorded. Filed Weights   09/04/20 0315  Weight: 81 kg    Examination: General exam: Alert oriented demented on RA HEENT:Oral mucosa moist, Ear/Nose WNL grossly, dentition normal. Respiratory system: bilaterally diminishedd,no wheezing or crackles,no use of accessory  muscle Cardiovascular system: S1 & S2 +, No JVD,. Gastrointestinal system: Abdomen soft, NT,ND, BS+ Nervous System:Alert, awake, moving extremities, needing wrist restraint. Head tremulous more when he is talking. Extremities: No edema, distal peripheral pulses palpable.  Skin: No rashes,no icterus. MSK: Normal muscle bulk,tone, power   Data Reviewed: I have personally reviewed following labs and imaging studies CBC: Recent Labs  Lab 09/08/20 0354 09/09/20 0302 09/10/20 0200 09/11/20 0302  WBC 4.4 4.1 3.5* 3.6*  NEUTROABS 3.3 3.4 2.7 2.7  HGB 8.2* 7.7* 7.5* 7.2*  HCT 27.4* 26.9* 25.3* 24.8*  MCV 106.2* 110.7* 108.1* 109.7*  PLT 190 194 187 [id number]   Basic Metabolic Panel: Recent Labs  Lab 09/08/20 0354 09/09/20 0302 09/10/20 0200 09/11/20 0302  NA 146* 146* 147* 149*  K 5.2* 4.8 4.8 5.0  CL 119* 120* 123* 122*  CO2 20* 19* 19* 20*  GLUCOSE 85 120* 88 84  BUN 44* 42* 38* 42*  CREATININE 3.18* 3.00* 2.97* 3.28*  CALCIUM 8.2* 8.2* 8.1* 8.3*   GFR: Estimated Creatinine Clearance: 15.8 mL/min (A) (by C-G formula based on SCr of 3.28 mg/dL (H)). Liver Function Tests: No results for input(s): AST, ALT, ALKPHOS, BILITOT, PROT, ALBUMIN in the last 168 hours. No results for input(s): LIPASE, AMYLASE in the last 168 hours. No results for input(s): AMMONIA in the last 168 hours. Coagulation Profile: No results for input(s): INR, PROTIME in the last 168 hours. Cardiac Enzymes: No results for input(s): CKTOTAL, CKMB, CKMBINDEX, TROPONINI in the last 168 hours. BNP (last 3 results) No results for input(s): PROBNP in the last 8760 hours. HbA1C: No results for input(s): HGBA1C in the last 72 hours. CBG: No results for input(s): GLUCAP in the last 168 hours. Lipid Profile: No results for input(s): CHOL, HDL, LDLCALC, TRIG, CHOLHDL, LDLDIRECT in  the last 72 hours. Thyroid Function Tests: No results for input(s): TSH, T4TOTAL, FREET4, T3FREE, THYROIDAB in the last 72 hours. Anemia  Panel: No results for input(s): VITAMINB12, FOLATE, FERRITIN, TIBC, IRON, RETICCTPCT in the last 72 hours. Sepsis Labs: No results for input(s): PROCALCITON, LATICACIDVEN in the last 168 hours.  Recent Results (from the past 240 hour(s))  Body fluid culture w Gram Stain     Status: None   Collection Time: 09/04/20 11:56 AM   Specimen: Lung, Left; Pleural Fluid  Result Value Ref Range Status   Specimen Description FLUID  Final   Special Requests LEFT THORACENTESIS  Final   Gram Stain NO WBC SEEN NO ORGANISMS SEEN   Final   Culture   Final    NO GROWTH 3 DAYS Performed at [facility], [address], [place], [place] [address]    Report Status 09/08/2020 FINAL  Final  SARS CORONAVIRUS 2 (TAT 6-24 HRS) Nasopharyngeal Nasopharyngeal Swab     Status: None   Collection Time: 09/08/20  9:38 AM   Specimen: Nasopharyngeal Swab  Result Value Ref Range Status   SARS Coronavirus 2 NEGATIVE NEGATIVE Final    Comment: (NOTE) SARS-CoV-2 target nucleic acids are NOT DETECTED.  The SARS-CoV-2 RNA is generally detectable in upper and lower respiratory specimens during the acute phase of infection. Negative results do not preclude SARS-CoV-2 infection, do not rule out co-infections with other pathogens, and should not be used as the sole basis for treatment or other patient management decisions. Negative results must be combined with clinical observations, patient history, and epidemiological information. The expected result is Negative.  Fact Sheet for Patients: SugarRoll.be  Fact Sheet for Healthcare Providers: https://www.woods-mathews.com/  This test is not yet approved or cleared by the [place] FDA and  has been authorized for detection and/or diagnosis of SARS-CoV-2 by FDA under an Emergency Use Authorization (EUA). This EUA will remain  in effect (meaning this test can be used) for the duration of the COVID-19 declaration under  Se ction 564(b)(1) of the Act, 21 U.S.C. section 360bbb-3(b)(1), unless the authorization is terminated or revoked sooner.  Performed at [facility], [place] [address], [place], [place] [address]      Radiology Studies: No results found.   LOS: 11 days   Antonieta Pert, MD [facility]  09/14/2020, 11:03 AM

## 2020-09-14 NOTE — Plan of Care (Signed)
Problem: Education: Goal: Knowledge of disease and its progression will improve 09/14/2020 1403 by Durwin Glaze, LPN Outcome: Progressing 09/14/2020 1403 by Durwin Glaze, LPN Outcome: Progressing   Problem: Health Behavior/Discharge Planning: Goal: Ability to manage health-related needs will improve 09/14/2020 1403 by Durwin Glaze, LPN Outcome: Progressing 09/14/2020 1403 by Durwin Glaze, LPN Outcome: Progressing   Problem: Clinical Measurements: Goal: Complications related to the disease process or treatment will be avoided or minimized 09/14/2020 1403 by Durwin Glaze, LPN Outcome: Progressing 09/14/2020 1403 by Durwin Glaze, LPN Outcome: Progressing Goal: Dialysis access will remain free of complications [id number] by Durwin Glaze, LPN Outcome: Progressing 09/14/2020 1403 by Durwin Glaze, LPN Outcome: Progressing   Problem: Activity: Goal: Activity intolerance will improve 09/14/2020 1403 by Durwin Glaze, LPN Outcome: Progressing 09/14/2020 1403 by Durwin Glaze, LPN Outcome: Progressing   Problem: Fluid Volume: Goal: Fluid volume balance will be maintained or improved 09/14/2020 1403 by Durwin Glaze, LPN Outcome: Progressing 09/14/2020 1403 by Durwin Glaze, LPN Outcome: Progressing   Problem: Nutritional: Goal: Ability to make appropriate dietary choices will improve 09/14/2020 1403 by Durwin Glaze, LPN Outcome: Progressing 09/14/2020 1403 by Durwin Glaze, LPN Outcome: Progressing   Problem: Respiratory: Goal: Respiratory symptoms related to disease process will be avoided 09/14/2020 1403 by Durwin Glaze, LPN Outcome: Progressing 09/14/2020 1403 by Durwin Glaze, LPN Outcome: Progressing   Problem: Self-Concept: Goal: Body image disturbance will be avoided or minimized 09/14/2020 1403 by Durwin Glaze, LPN Outcome: Progressing 09/14/2020 1403 by Durwin Glaze, LPN Outcome: Progressing   Problem:  Urinary Elimination: Goal: Progression of disease will be identified and treated 09/14/2020 1403 by Durwin Glaze, LPN Outcome: Progressing 09/14/2020 1403 by Durwin Glaze, LPN Outcome: Progressing   Problem: Education: Goal: Knowledge of General Education information will improve Description: Including pain rating scale, medication(s)/side effects and non-pharmacologic comfort measures 09/14/2020 1403 by Durwin Glaze, LPN Outcome: Progressing 09/14/2020 1403 by Durwin Glaze, LPN Outcome: Progressing   Problem: Health Behavior/Discharge Planning: Goal: Ability to manage health-related needs will improve 09/14/2020 1403 by Durwin Glaze, LPN Outcome: Progressing 09/14/2020 1403 by Durwin Glaze, LPN Outcome: Progressing   Problem: Clinical Measurements: Goal: Ability to maintain clinical measurements within normal limits will improve 09/14/2020 1403 by Durwin Glaze, LPN Outcome: Progressing 09/14/2020 1403 by Durwin Glaze, LPN Outcome: Progressing Goal: Will remain free from infection 09/14/2020 1403 by Durwin Glaze, LPN Outcome: Progressing 09/14/2020 1403 by Durwin Glaze, LPN Outcome: Progressing Goal: Diagnostic test results will improve 09/14/2020 1403 by Durwin Glaze, LPN Outcome: Progressing 09/14/2020 1403 by Durwin Glaze, LPN Outcome: Progressing Goal: Respiratory complications will improve 09/14/2020 1403 by Durwin Glaze, LPN Outcome: Progressing 09/14/2020 1403 by Durwin Glaze, LPN Outcome: Progressing Goal: Cardiovascular complication will be avoided 09/14/2020 1403 by Durwin Glaze, LPN Outcome: Progressing 09/14/2020 1403 by Durwin Glaze, LPN Outcome: Progressing   Problem: Activity: Goal: Risk for activity intolerance will decrease 09/14/2020 1403 by Durwin Glaze, LPN Outcome: Progressing 09/14/2020 1403 by Durwin Glaze, LPN Outcome: Progressing   Problem: Nutrition: Goal: Adequate nutrition will  be maintained 09/14/2020 1403 by Durwin Glaze, LPN Outcome: Progressing 09/14/2020 1403 by Durwin Glaze, LPN Outcome: Progressing   Problem: Coping: Goal: Level of anxiety will decrease 09/14/2020 1403 by Durwin Glaze, LPN Outcome: Progressing 09/14/2020 1403 by Durwin Glaze, LPN Outcome: Progressing  Problem: Elimination: Goal: Will not experience complications related to bowel motility 09/14/2020 1403 by Durwin Glaze, LPN Outcome: Progressing 09/14/2020 1403 by Durwin Glaze, LPN Outcome: Progressing Goal: Will not experience complications related to urinary retention 09/14/2020 1403 by Durwin Glaze, LPN Outcome: Progressing 09/14/2020 1403 by Durwin Glaze, LPN Outcome: Progressing   Problem: Pain Managment: Goal: General experience of comfort will improve 09/14/2020 1403 by Durwin Glaze, LPN Outcome: Progressing 09/14/2020 1403 by Durwin Glaze, LPN Outcome: Progressing   Problem: Skin Integrity: Goal: Risk for impaired skin integrity will decrease 09/14/2020 1403 by Durwin Glaze, LPN Outcome: Progressing 09/14/2020 1403 by Durwin Glaze, LPN Outcome: Progressing

## 2020-09-14 NOTE — Progress Notes (Signed)
Patients post care packet for pleurx drain and supplies was sent back to floor with patient. Care management was notified as well as the floor nurse that the documents must be filled out.

## 2020-09-14 NOTE — Progress Notes (Signed)
TOC CM received call from IR that pt will need assistance with getting set up with Pleurx drains and HHRN. TOC CM sent message to CM covering pt and consult placed. [place] [place] [facility] TOC CM [phone]

## 2020-09-15 DIAGNOSIS — G249 Dystonia, unspecified: Secondary | ICD-10-CM

## 2020-09-15 DIAGNOSIS — J90 Pleural effusion, not elsewhere classified: Secondary | ICD-10-CM | POA: Diagnosis not present

## 2020-09-15 LAB — POTASSIUM: Potassium: 4.2 mmol/L (ref 3.5–5.1)

## 2020-09-15 LAB — RESP PANEL BY RT-PCR (FLU A&B, COVID) ARPGX2
Influenza A by PCR: NEGATIVE
Influenza B by PCR: NEGATIVE
SARS Coronavirus 2 by RT PCR: NEGATIVE

## 2020-09-15 LAB — GLUCOSE, CAPILLARY
Glucose-Capillary: 134 mg/dL — ABNORMAL HIGH (ref 70–99)
Glucose-Capillary: 158 mg/dL — ABNORMAL HIGH (ref 70–99)
Glucose-Capillary: 104 mg/dL — ABNORMAL HIGH (ref 70–99)
Glucose-Capillary: 109 mg/dL — ABNORMAL HIGH (ref 70–99)
Glucose-Capillary: 87 mg/dL (ref 70–99)

## 2020-09-15 LAB — GLUCOSE, RANDOM: Glucose, Bld: 108 mg/dL — ABNORMAL HIGH (ref 70–99)

## 2020-09-15 MED ORDER — FUROSEMIDE 10 MG/ML IJ SOLN: 40.0000 mg | Freq: Once | INTRAMUSCULAR | Status: DC

## 2020-09-15 MED ORDER — INSULIN ASPART 100 UNIT/ML IV SOLN: 4.0000 [IU] | Freq: Once | INTRAVENOUS | Status: DC

## 2020-09-15 MED ORDER — CLONAZEPAM 0.5 MG PO TBDP
0.5000 mg | ORAL_TABLET | Freq: Two times a day (BID) | ORAL | Status: DC | PRN
  Administered 2020-09-17: 0.5 mg via ORAL
  Filled 2020-09-15 (×2): qty 1

## 2020-09-15 MED ORDER — SODIUM ZIRCONIUM CYCLOSILICATE 10 G PO PACK
10.0000 g | PACK | Freq: Every day | ORAL | Status: DC
  Administered 2020-09-16: 10 g via ORAL
  Filled 2020-09-15: qty 1

## 2020-09-15 MED ORDER — DEXTROSE 50 % IV SOLN: 50.0000 mL | Freq: Once | INTRAVENOUS | Status: DC

## 2020-09-15 MED ORDER — INSULIN ASPART 100 UNIT/ML IV SOLN
5.0000 [IU] | Freq: Once | INTRAVENOUS | Status: AC
  Administered 2020-09-15: 5 [IU] via INTRAVENOUS

## 2020-09-15 MED ORDER — ACETAMINOPHEN 500 MG PO TABS: 1000.0000 mg | ORAL_TABLET | Freq: Every day | ORAL | 0 refills | Status: AC

## 2020-09-15 MED ORDER — SODIUM CHLORIDE 0.45 % IV SOLN: INTRAVENOUS | Status: DC

## 2020-09-15 MED ORDER — ALBUTEROL SULFATE HFA 108 (90 BASE) MCG/ACT IN AERS: 4.0000 | INHALATION_SPRAY | Freq: Once | RESPIRATORY_TRACT | Status: AC

## 2020-09-15 MED ORDER — DEXTROSE 50 % IV SOLN
1.0000 | Freq: Once | INTRAVENOUS | Status: AC
  Administered 2020-09-15: 50 mL via INTRAVENOUS
  Filled 2020-09-15: qty 50

## 2020-09-15 MED ORDER — SODIUM ZIRCONIUM CYCLOSILICATE 10 G PO PACK
10.0000 g | PACK | ORAL | Status: AC
  Administered 2020-09-15: 10 g via ORAL
  Filled 2020-09-15: qty 1

## 2020-09-15 MED ORDER — DEXTROSE-NACL 5-0.45 % IV SOLN: INTRAVENOUS | Status: AC

## 2020-09-15 MED ORDER — ACETAMINOPHEN 500 MG PO TABS
1000.0000 mg | ORAL_TABLET | Freq: Every day | ORAL | Status: DC
  Administered 2020-09-16 – 2020-09-17 (×3): 1000 mg via ORAL
  Filled 2020-09-15 (×3): qty 2

## 2020-09-15 MED ORDER — CLONIDINE HCL 0.1 MG PO TABS
0.1000 mg | ORAL_TABLET | Freq: Two times a day (BID) | ORAL | Status: DC
  Administered 2020-09-16 (×2): 0.1 mg via ORAL
  Filled 2020-09-15 (×3): qty 1

## 2020-09-15 MED ORDER — SODIUM CHLORIDE 0.9 % IV BOLUS: 500.0000 mL | Freq: Once | INTRAVENOUS | Status: DC

## 2020-09-15 MED ORDER — CLONIDINE HCL 0.1 MG PO TABS: 0.1000 mg | ORAL_TABLET | Freq: Two times a day (BID) | ORAL | 11 refills | Status: AC

## 2020-09-15 NOTE — Care Management Important Message (Signed)
Important Message  Patient Details  Name: Wayne Fuller MRN: [id number] Date of Birth: 02/26/1924   Medicare Important Message Given:  Yes     Orbie Pyo 09/15/2020, 3:22 PM

## 2020-09-15 NOTE — Progress Notes (Signed)
Manufacturing engineer (ACC)  DME is in place at facility.  Family has decided to address other medical concerns as opposed to discharging today.  [facility] RN to evaluate still to determine if they can provide the level of care he needs.  ACC will continue to follow.  [name] RN, BSN, [facility]iaison

## 2020-09-15 NOTE — Progress Notes (Signed)
PROGRESS NOTE    Wayne Fuller  [id number] DOB: 12-08-1923 DOA: 09/03/2020 PCP: [name], NP   Chief Complaint  Patient presents with  . Altered Mental Status  Brief Narrative: 41 male with significant history of advanced dementia, CKD stage IV, hypertension, PAF not on anticoagulation brought to the ED with shortness of breath for few days and SSI generalized weakness poor oral intake over the past week along with 2 days of nausea vomiting diarrhea due to viral illness that was going around in the skilled nursing facility between 1 to 2 weeks. Upon arrival to ED, he was hypoxic requiring oxygen and also had AKI on CKD stage IV with hyperkalemia.  COVID neg. he did have peaked T waves on EKG.  There was some concern about left lower lobe pneumonia on the chest x-ray for which he was started on Rocephin and Zithromax.  CT chest showed large left-sided pleural effusion.  He was admitted to hospital service.  IR was consulted and he underwent left thoracentesis with 1500 cc of urine.  Fluid analysis indicated transudative fluid.  Patient's oxygen demand improved.  He was then started on gentle IV hydration as his p.o. intake was not enough.  With that, his renal function started to improve as well.  He also developed acute on chronic anemia where his hemoglobin dropped to 6.1 on 09/05/2020 for which she received 1 unit of PRBC suspicion and since then his hemoglobin remained stable over 7.5.  Repeat chest x-ray was done on 09/08/2020 which once again showed large left-sided pleural effusion.  Another thoracentesis was done on 09/09/2020 with removal of 1.7 L of bloody fluid.  Palliative care has been following patient.  Repeat chest x-ray since last 2 days shows slow reaccumulation of the left pleural effusion again.  Palliative care has been following and after extensive discussion  patient/family opted to pursue comfort measures and Pleurx catheter placement.    Subjective:  Seen this  morning. He has a Pleurx catheter in place.  Had some head bobbing movement No other new complaints. Did not eat breakfast well but nursing able to feed the for lunch. Labs were drawn yesterday evening but potassium did come back up at 6.0 and on discussion with patient's son and brother today - they want to pursue hydration and some treatment for potassium until he talks to his brother and sister-I asked him to be clear on Kaser.  Assessment & Plan:  Acute hypoxic respiratory failure due to large left-sided pleural effusion Possible left lower lobe community-acquired pneumonia Recurrent left sided pleural effusion Status post thoracentesis x2 with reaccumulation.Initially transudative fluid.  Patient completed 5 days of antibiotics on 4/20. After palliative care discussion  And for comfort measures Pleurx catheter placed by IR  4/25.  Hospice to manage Pleurx catheter.  Currently on room air.  AKI on CKD stage IV/metabolic acidosis-creatinine y worsening.  In the setting of dementia/marginal oral intake- son wants him some sort of hydration I have added 0.45 NS at 50 mill per hour-requested palliative team care evaluation again. Recent Labs  Lab 09/09/20 0302 09/10/20 0200 09/11/20 0302 09/14/20 1942  BUN 42* 38* 42* 48*  CREATININE 3.00* 2.97* 3.28* 3.35*   Hyperkalemia labs to be drawn yesterday for prognosis but in talking with patient's daughter and son they want to pursue some treatment I have added gentle IV fluids dextrose with insulin 5 units and Lokelma-they would like the K to come down before they make decision-as Son will need to  talk with rest of his brother and sister.Repeat potassium later today.  Dr. Hilma Favors will call family today to discuss.  Acute on chronic anemia in the setting of CKD status post 1 unit PRBC. Recent Labs  Lab 09/09/20 0302 09/10/20 0200 09/11/20 0302  HGB 7.7* 7.5* 7.2*  HCT 26.9* 25.3* 24.8*   Advanced dementia/wheelchair-he is bedbound with  advanced dementia.  Does not follow instructions or commands.  Cont Seroquel, delirium precaution, supportive measures. Essential hypertension BP is controlled continue home meds with amlodipine hydralazine Toprol. PAF rate controlled on amiodarone Toprol aspirin. Goals of care/DNR DNR palliative care following comfort measures but patient family want to pursue treatment for potassium. Dysphagia on dysphagia 1 diet able to feed with nursing assistance. Head movement likely in the setting of uremia electrolyte disturbance hyperkalemia.   Patient is DNR/comfort measures however patient's son/daughter appears to have unrealistic expectation-he wants some treatment and hydration for his potassium and renal failure. I told him it will be a temporizing measures - he is going to discuss with his brother and sister to further clarify goals of care . I also requested palliative care re-engage been requested this is Dr. Hilma Favors.  Diet Order            DIET - DYS 1 Room service appropriate? Yes; Fluid consistency: Nectar Thick  Diet effective now                 Patient's Body mass index is 22.92 kg/m. DVT prophylaxis: NONE for comfort Code Status:   Code Status: DNR  Family Communication: plan of care discussed with patient at bedside. Discussed with the nursing staff. I spoke with patient's son and daughter Overall VERY POOR Prognosis.  Status is: Inpatient Remains inpatient appropriate because:Inpatient level of care appropriate due to severity of illness and Awaiting for Pleurx catheter placement  Dispo: The patient is from: Memory care unit              Anticipated d/c is to: Hospice- TBD alf w/ hospcie .              Patient currently is not medically stable to d/c.   Difficult to place patient No   Unresulted Labs (From admission, onward)          Start     Ordered   09/15/20 1630  Potassium  Now then every 4 hours,   STAT (with TIMED occurrences)     Comments: Until normal  twice.   Question:  Specimen collection method  Answer:  Lab=Lab collect   09/15/20 1459   09/15/20 1615  Glucose, random  (Moderate hyperkalemia:  Potassium  6.5 to 7 mEq/L without ECG changes)  Once,   R       Comments: 1 hour after insulin administered.   Question:  Specimen collection method  Answer:  Lab=Lab collect   09/15/20 1459   09/04/20 [id number]  Body fluid culture (includes gram stain)  (Thoracentesis Labs Panel)  Once,   R       Question:  Are there also cytology or pathology orders on this specimen?  Answer:  Yes   09/04/20 [id number]   Pending  Basic metabolic panel  Once,   R       Question:  Specimen collection method  Answer:  Lab=Lab collect   Pending          Medications reviewed:  Scheduled Meds: . albuterol  4 puff Inhalation Once  . amiodarone  200 mg Oral  Daily  . amLODipine  10 mg Oral Daily  . insulin aspart  5 Units Intravenous Once   And  . dextrose  1 ampule Intravenous Once  . hydrALAZINE  100 mg Oral TID  . ipratropium  2 puff Inhalation Once  . melatonin  3 mg Oral QHS  . metoprolol succinate  25 mg Oral Daily  . polyethylene glycol  17 g Oral Daily  . sodium bicarbonate  650 mg Oral BID  . sodium chloride  2 spray Each Nare Daily  . tamsulosin  0.4 mg Oral QHS   Continuous Infusions: . sodium chloride      Consultants:see note  Procedures:see note  Antimicrobials: Anti-infectives (From admission, onward)   Start     Dose/Rate Route Frequency Ordered Stop   09/07/20 1000  azithromycin (ZITHROMAX) tablet 500 mg        500 mg Oral Daily 09/06/20 1156 09/07/20 0952   09/04/20 2200  cefTRIAXone (ROCEPHIN) 2 g in sodium chloride 0.9 % 100 mL IVPB        2 g 200 mL/hr over 30 Minutes Intravenous Every 24 hours 09/03/20 2312 09/09/20 2159   09/04/20 2200  azithromycin (ZITHROMAX) 500 mg in sodium chloride 0.9 % 250 mL IVPB  Status:  Discontinued        500 mg 250 mL/hr over 60 Minutes Intravenous Every 24 hours 09/03/20 2312 09/06/20 1156    09/04/20 1730  cefTRIAXone (ROCEPHIN) 1 g in sodium chloride 0.9 % 100 mL IVPB  Status:  Discontinued        1 g 200 mL/hr over 30 Minutes Intravenous Every 24 hours 09/04/20 1634 09/04/20 1643   09/04/20 1730  azithromycin (ZITHROMAX) 500 mg in sodium chloride 0.9 % 250 mL IVPB  Status:  Discontinued        500 mg 250 mL/hr over 60 Minutes Intravenous Every 24 hours 09/04/20 1634 09/04/20 1644   09/03/20 2230  cefTRIAXone (ROCEPHIN) 1 g in sodium chloride 0.9 % 100 mL IVPB        1 g 200 mL/hr over 30 Minutes Intravenous  Once 09/03/20 2229 09/03/20 2332   09/03/20 2230  azithromycin (ZITHROMAX) 500 mg in sodium chloride 0.9 % 250 mL IVPB        500 mg 250 mL/hr over 60 Minutes Intravenous  Once 09/03/20 2229 09/04/20 0148     Culture/Microbiology    Component Value Date/Time   SDES FLUID 09/04/2020 1156   SPECREQUEST LEFT THORACENTESIS 09/04/2020 1156   CULT  09/04/2020 1156    NO GROWTH 3 DAYS Performed at [facility], [address] [place], [place] [address]    REPTSTATUS 09/08/2020 FINAL 09/04/2020 1156    Other culture-see note  Objective: Vitals: Today's Vitals   09/14/20 1601 09/14/20 2110 09/14/20 2147 09/15/20 1057  BP: (!) 144/50 (!) 136/42  (!) 163/51  Pulse: (!) 54 (!) 59  60  Resp: '16 20  17  '$ Temp: 98.7 F (37.1 C)     TempSrc: Axillary     SpO2: 99%   100%  Weight:      Height:      PainSc: 0-No pain 0-No pain Asleep 0-No pain    Intake/Output Summary (Last 24 hours) at 09/15/2020 1500 Last data filed at 09/14/2020 2008 Gross per 24 hour  Intake 60.83 ml  Output 1000 ml  Net -939.17 ml   Filed Weights   09/04/20 0315  Weight: 81 kg   Weight change:   Intake/Output from  previous day: 04/25 0701 - 04/26 0700 In: 60.8 [I.V.:60.8] Out: 1000 [Urine:1000] Intake/Output this shift: No intake/output data recorded. Filed Weights   09/04/20 0315  Weight: 81 kg    Examination: General exam: Alert awake oriented x0, head bobbing movement.    HEENT:Oral mucosa moist, Ear/Nose WNL grossly, dentition normal. Respiratory system: bilaterally clear, left chest with Pleurx catheter with dressing in place Cardiovascular system: S1 & S2 +, No JVD,. Gastrointestinal system: Abdomen soft, NT,ND, BS+ Nervous System:Alert, awake, demented. Extremities: No edema, distal peripheral pulses palpable.  Skin: No rashes,no icterus. MSK: thin muscle bulk,tone, power  Data Reviewed: I have personally reviewed following labs and imaging studies CBC: Recent Labs  Lab 09/09/20 0302 09/10/20 0200 09/11/20 0302  WBC 4.1 3.5* 3.6*  NEUTROABS 3.4 2.7 2.7  HGB 7.7* 7.5* 7.2*  HCT 26.9* 25.3* 24.8*  MCV 110.7* 108.1* 109.7*  PLT 194 187 [id number]   Basic Metabolic Panel: Recent Labs  Lab 09/09/20 0302 09/10/20 0200 09/11/20 0302 09/14/20 1942  NA 146* 147* 149* 152*  K 4.8 4.8 5.0 6.0*  CL 120* 123* 122* 121*  CO2 19* 19* 20* 19*  GLUCOSE 120* 88 84 138*  BUN 42* 38* 42* 48*  CREATININE 3.00* 2.97* 3.28* 3.35*  CALCIUM 8.2* 8.1* 8.3* 8.4*   GFR: Estimated Creatinine Clearance: 15.4 mL/min (A) (by C-G formula based on SCr of 3.35 mg/dL (H)). Liver Function Tests: No results for input(s): AST, ALT, ALKPHOS, BILITOT, PROT, ALBUMIN in the last 168 hours. No results for input(s): LIPASE, AMYLASE in the last 168 hours. No results for input(s): AMMONIA in the last 168 hours. Coagulation Profile: No results for input(s): INR, PROTIME in the last 168 hours. Cardiac Enzymes: No results for input(s): CKTOTAL, CKMB, CKMBINDEX, TROPONINI in the last 168 hours. BNP (last 3 results) No results for input(s): PROBNP in the last 8760 hours. HbA1C: No results for input(s): HGBA1C in the last 72 hours. CBG: No results for input(s): GLUCAP in the last 168 hours. Lipid Profile: No results for input(s): CHOL, HDL, LDLCALC, TRIG, CHOLHDL, LDLDIRECT in the last 72 hours. Thyroid Function Tests: No results for input(s): TSH, T4TOTAL, FREET4, T3FREE,  THYROIDAB in the last 72 hours. Anemia Panel: No results for input(s): VITAMINB12, FOLATE, FERRITIN, TIBC, IRON, RETICCTPCT in the last 72 hours. Sepsis Labs: No results for input(s): PROCALCITON, LATICACIDVEN in the last 168 hours.  Recent Results (from the past 240 hour(s))  SARS CORONAVIRUS 2 (TAT 6-24 HRS) Nasopharyngeal Nasopharyngeal Swab     Status: None   Collection Time: 09/08/20  9:38 AM   Specimen: Nasopharyngeal Swab  Result Value Ref Range Status   SARS Coronavirus 2 NEGATIVE NEGATIVE Final    Comment: (NOTE) SARS-CoV-2 target nucleic acids are NOT DETECTED.  The SARS-CoV-2 RNA is generally detectable in upper and lower respiratory specimens during the acute phase of infection. Negative results do not preclude SARS-CoV-2 infection, do not rule out co-infections with other pathogens, and should not be used as the sole basis for treatment or other patient management decisions. Negative results must be combined with clinical observations, patient history, and epidemiological information. The expected result is Negative.  Fact Sheet for Patients: SugarRoll.be  Fact Sheet for Healthcare Providers: https://www.woods-mathews.com/  This test is not yet approved or cleared by the [place] FDA and  has been authorized for detection and/or diagnosis of SARS-CoV-2 by FDA under an Emergency Use Authorization (EUA). This EUA will remain  in effect (meaning this test can be used) for the duration  of the COVID-19 declaration under Se ction 564(b)(1) of the Act, 21 U.S.C. section 360bbb-3(b)(1), unless the authorization is terminated or revoked sooner.  Performed at [facility], [place] [address], [place], [place] [address]   Resp Panel by RT-PCR (Flu A&B, Covid) Nasopharyngeal Swab     Status: None   Collection Time: 09/15/20 11:12 AM   Specimen: Nasopharyngeal Swab; Nasopharyngeal(NP) swabs in vial transport medium  Result  Value Ref Range Status   SARS Coronavirus 2 by RT PCR NEGATIVE NEGATIVE Final    Comment: (NOTE) SARS-CoV-2 target nucleic acids are NOT DETECTED.  The SARS-CoV-2 RNA is generally detectable in upper respiratory specimens during the acute phase of infection. The lowest concentration of SARS-CoV-2 viral copies this assay can detect is 138 copies/mL. A negative result does not preclude SARS-Cov-2 infection and should not be used as the sole basis for treatment or other patient management decisions. A negative result may occur with  improper specimen collection/handling, submission of specimen other than nasopharyngeal swab, presence of viral mutation(s) within the areas targeted by this assay, and inadequate number of viral copies(<138 copies/mL). A negative result must be combined with clinical observations, patient history, and epidemiological information. The expected result is Negative.  Fact Sheet for Patients:  EntrepreneurPulse.com.au  Fact Sheet for Healthcare Providers:  IncredibleEmployment.be  This test is no t yet approved or cleared by the [place] FDA and  has been authorized for detection and/or diagnosis of SARS-CoV-2 by FDA under an Emergency Use Authorization (EUA). This EUA will remain  in effect (meaning this test can be used) for the duration of the COVID-19 declaration under Section 564(b)(1) of the Act, 21 U.S.C.section 360bbb-3(b)(1), unless the authorization is terminated  or revoked sooner.       Influenza A by PCR NEGATIVE NEGATIVE Final   Influenza B by PCR NEGATIVE NEGATIVE Final    Comment: (NOTE) The Xpert Xpress SARS-CoV-2/FLU/RSV plus assay is intended as an aid in the diagnosis of influenza from Nasopharyngeal swab specimens and should not be used as a sole basis for treatment. Nasal washings and aspirates are unacceptable for Xpert Xpress SARS-CoV-2/FLU/RSV testing.  Fact Sheet for  Patients: EntrepreneurPulse.com.au  Fact Sheet for Healthcare Providers: IncredibleEmployment.be  This test is not yet approved or cleared by the [place] FDA and has been authorized for detection and/or diagnosis of SARS-CoV-2 by FDA under an Emergency Use Authorization (EUA). This EUA will remain in effect (meaning this test can be used) for the duration of the COVID-19 declaration under Section 564(b)(1) of the Act, 21 U.S.C. section 360bbb-3(b)(1), unless the authorization is terminated or revoked.  Performed at [facility], [place] [address], [place], [place] [address]      Radiology Studies: IR [facility] PLEURAL DRAIN W/INDWELL CATH W/IMG GUIDE  Result Date: 09/14/2020 INDICATION: 87 year old gentleman with CHF and recurrent left pleural effusion presents to interventional radiology for chest PleurX placement prior to admission to hospice. EXAM: Ultrasound-guided left PleurX placement. MEDICATIONS: The patient is currently admitted to the hospital and receiving intravenous antibiotics. The antibiotics were administered within an appropriate time frame prior to the initiation of the procedure. ANESTHESIA/SEDATION: Fentanyl 25 mcg IV; Versed 1.5 mg IV Moderate Sedation Time:  16 minutes The patient was continuously monitored during the procedure by the interventional radiology nurse under my direct supervision. COMPLICATIONS: None immediate. PROCEDURE: Informed written consent was obtained from the patient's daughter, Claudine Mouton, after a thorough discussion of the procedural risks, benefits and alternatives. All questions were addressed.  Maximal Sterile Barrier Technique was utilized including caps, mask, sterile gowns, sterile gloves, sterile drape, hand hygiene and skin antiseptic. A timeout was performed prior to the initiation of the procedure. Ultrasound examination demonstrated moderate amount of effusion in the left pleural space. Ultrasound image of  the left pleural effusion was obtained and placed in permanent medical record. Overlying skin was prepped and draped in the usual sterile fashion. Following local lidocaine administration, the left pleural space was accessed with a sheathed needle. PleurX catheter was brought to the access site through a short subcutaneous tunnel. Peel-away sheath placed and PleurX catheter inserted through the peel-away sheath. Approximately 1,200 mL of bloody fluid was removed. The access site was closed with Dermabond. The PleurX catheter was secured at the skin entry site with non-absorbable silk suture. Sterile dressing applied over the insertion site. IMPRESSION: Left chest PleurX catheter placement Electronically Signed   By: Miachel Roux M.D.   On: 09/14/2020 16:02     LOS: 12 days   Antonieta Pert, MD [facility]  09/15/2020, 3:00 PM

## 2020-09-15 NOTE — Progress Notes (Signed)
Palliative Care Progress Note  Called today re: confusion about goals of care and disposition/discharge plan. Currently family are requesting time to consider their goals and options-we are still not sure if [place] can meet his care requirements even with hospice services but I also see where DME has already been delivered to [facility]. He has abnormal lab work as anticipated- there is not reversible pathology and any intervention to attempt to correct his electrolytes is only temporary and not a real solution-and correcting the electrolytes has also not helped his symptoms of neck/ head bobbing/ dystonic/dyskenetic movements or his overall mental status. His PO intake is poor and he cannot maintain his nutritional status with his current level of PO intake because his body is preparing for end of life.   Family to discuss current options and our team will check back in tomorrow. In his current situation I think a hospice facility would be most appropriate given his lab finding that would suggest electrolyte disturbance commonly seen at EOL.   Recommendations:  1. Schedule Tylenol QHS 2. Hydromorphone for pain PRN 3. O2 as needed for comfort 4. Regular medications - as tolerated 5. Assistance with feeding and meals-comfort feeding as tolerated 6. Will add PRN Clonazapam for agitation and sleep-he has been on ativan in the past. 7. For involuntary head nodding/Cervical Dyskinesia could add on clonidine but I suspect this is from the head injury and C-spine injury he sustained last year 05/2019 in a significant fall. 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     Lane Hacker, DO Palliative Medicine [phone]  Time: 25 min  Greater than 50%  of this time was spent counseling and coordinating care related to the above assessment and plan.

## 2020-09-15 NOTE — Discharge Summary (Addendum)
Physician Discharge Summary  Wayne Fuller [id number] DOB: Apr 26, 1924 DOA: 09/03/2020  PCP: [name], NP  Admit date: 09/03/2020 Discharge date: 09/17/2020  Admitted From: alf Disposition: hospice  Recommendations for Outpatient Follow-up:  1. Follow up w/ hospice  Home Health:no  Equipment/Devices: none  Discharge Condition: Stable Code Status:   Code Status: DNR Diet recommendation:  Diet Order            DIET - DYS 1 Room service appropriate? Yes; Fluid consistency: Nectar Thick  Diet effective now                 Brief/Interim Summary: 21 male with significant history of advanced dementia, CKD stage IV, hypertension, PAF not on anticoagulation brought to the ED with shortness of breath for few days and SSI generalized weakness poor oral intake over the past week along with 2 days of nausea vomiting diarrhea due to viral illness that was going around in the skilled nursing facility between 1 to 2 weeks. Upon arrival to ED, he was hypoxic requiring oxygen and also had AKI on CKD stage IV with hyperkalemia. COVID neg. he did have peaked T waves on EKG. There was some concern about left lower lobe pneumonia on the chest x-ray for which he was started on Rocephin and Zithromax. CT chest showed large left-sided pleural effusion. He was admitted to hospital service. IR was consulted and he underwent left thoracentesis with 1500 cc of urine. Fluid analysis indicated transudative fluid. Patient's oxygen demand improved. He was then started on gentle IV hydration as his p.o. intake was not enough. With that, his renal function started to improve as well. He also developed acute on chronic anemia where his hemoglobin dropped to 6.1 on 09/05/2020 for which she received 1 unit of PRBC suspicion and since then his hemoglobin remained stable over 7.5. Repeat chest x-ray was done on 09/08/2020 which once again showed large left-sided pleural effusion. Another thoracentesis was  done on 09/09/2020 with removal of 1.7 L of bloody fluid. Palliative care has been following patient. Repeat chest x-ray since last 2 days shows slow reaccumulation of the left pleural effusion again.  Palliative care has been following and after extensive discussion patient/family opted to pursue comfort measures and Pleurx catheter placement-which was done on 4/25 evening. Continue pleurex catheter hospice to take care of Pleurx catheter. After further evaluation and further family meeting with palliative care team plan was for transition to hospice at [facility] and is being discharged 4/28.  Discharge Diagnoses:  Acute hypoxic respiratory failure due to large left-sided pleural effusion Possible left lower lobe community-acquired pneumonia Recurrent left sided pleural effusion S/p thoracentesis x2 with reaccumulation.Initially transudative fluid. Patient completed 5 days of antibiotics on 4/20. After palliative care discussion planned  comfort measures w/ Pleurx catheter and was placedby IR 4/25.Hospice to manage Pleurx catheter.  Currently on room air.  AKI on CKD stage IV/metabolic acidosis-creatinine has bumped to 3.3.In the setting of dementia/marginal oral intake.  Hydrated with IV fluids overnight. Cont to encourage oral intake  further meeting w.palliative.  Family 4/27 and plan for hospice.    Hyperkalemia k improved from 6> 4.2 w/ insulin IV fluids Lokelma, now further uptrending 5.3. This is in the setting of his renal failure.   Acute on chronic anemia in the setting of CKD status post 1 unit PRBC. Last Labs       Recent Labs  Lab 09/10/20 0200 09/11/20 0302  HGB 7.5* 7.2*  HCT 25.3* 24.8*  Advanced dementia/wheelchair-he is bedbound with advanced dementia.  Does not follow instructions or commands.  Cont Seroquel, delirium precaution and other supportive measures.  Essential hypertension BP stable on amlodipine hydralazine Toprol.  PAF rate controlled on  amiodarone Toprol aspirin.  Dysphagia on dysphagia 1 diet able to feed with nursing assistance.  Head movement question cervical dyskinesia could be from his previous C-spine injury also has very severe C-spine disease with fusion from C3-7. Found on floor by nursing staff despite bed alarm being on- this am, no obvious injury noticed, vital stable-came and examined personally, patient is alert,awake, not in pain. RN reprots  No bruise and was able to stand/bear his wt and was brought  Back to bed. I called and discussed with daughter and after discussion obtained CT head and xray pelvis done- no acute fracture.  Goals of care/DNR DNR palliative care following comfort measures but son and daughter wanted to pursue treatment of hyperkalemia 4/26 pm until he is able to discuss with his sister and brother about further disposition return to ALF with hospice versus [facility] .  Family decided on transition to inpatient hospice after palliative care meeting.  Consults:  IR, palliative care  Subjective: Alert awake resting comfortably some head shaking,  Discharge Exam: Vitals:   09/17/20 1019 09/17/20 1228  BP: (!) 109/41 (!) 118/42  Pulse: (!) 51 (!) 58  Resp: 18 18  Temp: 98.8 F (37.1 C) 98.5 F (36.9 C)  SpO2: 99% 98%   eneral exam: Demented, not in distress, responds with saying ahh,older than stated age, weak appearing. HEENT:Oral mucosa moist, Ear/Nose WNL grossly, dentition normal. Respiratory system: bilaterally diminished, no use of accessory muscle Cardiovascular system: S1 & S2 +, No JVD,. Gastrointestinal system: Abdomen soft,NT,ND, BS+ Nervous System demented moving extremities. Extremities: no edema, distal peripheral pulses palpable.  Skin: No rashes,no icterus. MSK: thin, cachectic small bulk/tone, power  Discharge Instructions  Discharge Instructions    Increase activity slowly   Complete by: As directed      Allergies as of 09/17/2020   No Known  Allergies     Medication List    STOP taking these medications   alum & mag hydroxide-simeth 200-200-20 MG/5ML suspension Commonly known as: MAALOX/MYLANTA   amiodarone 200 MG tablet Commonly known as: PACERONE   amLODipine 10 MG tablet Commonly known as: NORVASC   aspirin EC 81 MG tablet   BAZA PROTECT EX   famotidine 20 MG tablet Commonly known as: PEPCID   ferrous sulfate 325 (65 FE) MG tablet   guaifenesin 100 MG/5ML syrup Commonly known as: ROBITUSSIN   hydrALAZINE 100 MG tablet Commonly known as: APRESOLINE   loperamide 2 MG capsule Commonly known as: IMODIUM   magnesium hydroxide 400 MG/5ML suspension Commonly known as: MILK OF MAGNESIA   memantine 5 MG tablet Commonly known as: NAMENDA   metoprolol succinate 25 MG 24 hr tablet Commonly known as: TOPROL-XL   sodium bicarbonate 650 MG tablet   sodium chloride 0.65 % Soln nasal spray Commonly known as: OCEAN   tamsulosin 0.4 MG Caps capsule Commonly known as: FLOMAX   Vitamin D (Cholecalciferol) 25 MCG (1000 UT) Caps     TAKE these medications   acetaminophen 500 MG tablet Commonly known as: TYLENOL Take 2 tablets (1,000 mg total) by mouth at bedtime. What changed:   how much to take  when to take this  reasons to take this   albuterol 108 (90 Base) MCG/ACT inhaler Commonly known as: VENTOLIN HFA Inhale  4 puffs into the lungs once for 1 dose.   clonazePAM 0.5 MG disintegrating tablet Commonly known as: KLONOPIN Take 1 tablet (0.5 mg total) by mouth 2 (two) times daily as needed (agitation, tremors).   cloNIDine 0.1 MG tablet Commonly known as: CATAPRES Take 1 tablet (0.1 mg total) by mouth 2 (two) times daily.   melatonin 3 MG Tabs tablet Take 3 mg by mouth at bedtime. For sleep   polyethylene glycol 17 g packet Commonly known as: MiraLax Take 17 g by mouth daily.   QUEtiapine 25 MG tablet Commonly known as: SEROQUEL Take 0.5 tablets (12.5 mg total) by mouth at bedtime.    Triple Antibiotic [phone] Oint Apply 1 application topically as needed (minor skin tears or abrasions). Clean area with normal saline, apply triple antibiotic, cover with bandaid or gauze and tape, change as needed until healed.       Follow-up Information    ALF hospice team Follow up in 1 day(s).              No Known Allergies  The results of significant diagnostics from this hospitalization (including imaging, microbiology, ancillary and laboratory) are listed below for reference.    Microbiology: Recent Results (from the past 240 hour(s))  SARS CORONAVIRUS 2 (TAT 6-24 HRS) Nasopharyngeal Nasopharyngeal Swab     Status: None   Collection Time: 09/08/20  9:38 AM   Specimen: Nasopharyngeal Swab  Result Value Ref Range Status   SARS Coronavirus 2 NEGATIVE NEGATIVE Final    Comment: (NOTE) SARS-CoV-2 target nucleic acids are NOT DETECTED.  The SARS-CoV-2 RNA is generally detectable in upper and lower respiratory specimens during the acute phase of infection. Negative results do not preclude SARS-CoV-2 infection, do not rule out co-infections with other pathogens, and should not be used as the sole basis for treatment or other patient management decisions. Negative results must be combined with clinical observations, patient history, and epidemiological information. The expected result is Negative.  Fact Sheet for Patients: SugarRoll.be  Fact Sheet for Healthcare Providers: https://www.woods-mathews.com/  This test is not yet approved or cleared by the [place] FDA and  has been authorized for detection and/or diagnosis of SARS-CoV-2 by FDA under an Emergency Use Authorization (EUA). This EUA will remain  in effect (meaning this test can be used) for the duration of the COVID-19 declaration under Se ction 564(b)(1) of the Act, 21 U.S.C. section 360bbb-3(b)(1), unless the authorization is terminated or revoked  sooner.  Performed at [facility], [place] [address], [place], [place] [address]   Resp Panel by RT-PCR (Flu A&B, Covid) Nasopharyngeal Swab     Status: None   Collection Time: 09/15/20 11:12 AM   Specimen: Nasopharyngeal Swab; Nasopharyngeal(NP) swabs in vial transport medium  Result Value Ref Range Status   SARS Coronavirus 2 by RT PCR NEGATIVE NEGATIVE Final    Comment: (NOTE) SARS-CoV-2 target nucleic acids are NOT DETECTED.  The SARS-CoV-2 RNA is generally detectable in upper respiratory specimens during the acute phase of infection. The lowest concentration of SARS-CoV-2 viral copies this assay can detect is 138 copies/mL. A negative result does not preclude SARS-Cov-2 infection and should not be used as the sole basis for treatment or other patient management decisions. A negative result may occur with  improper specimen collection/handling, submission of specimen other than nasopharyngeal swab, presence of viral mutation(s) within the areas targeted by this assay, and inadequate number of viral copies(<138 copies/mL). A negative result must be combined with clinical  observations, patient history, and epidemiological information. The expected result is Negative.  Fact Sheet for Patients:  EntrepreneurPulse.com.au  Fact Sheet for Healthcare Providers:  IncredibleEmployment.be  This test is no t yet approved or cleared by the [place] FDA and  has been authorized for detection and/or diagnosis of SARS-CoV-2 by FDA under an Emergency Use Authorization (EUA). This EUA will remain  in effect (meaning this test can be used) for the duration of the COVID-19 declaration under Section 564(b)(1) of the Act, 21 U.S.C.section 360bbb-3(b)(1), unless the authorization is terminated  or revoked sooner.       Influenza A by PCR NEGATIVE NEGATIVE Final   Influenza B by PCR NEGATIVE NEGATIVE Final    Comment: (NOTE) The Xpert Xpress  SARS-CoV-2/FLU/RSV plus assay is intended as an aid in the diagnosis of influenza from Nasopharyngeal swab specimens and should not be used as a sole basis for treatment. Nasal washings and aspirates are unacceptable for Xpert Xpress SARS-CoV-2/FLU/RSV testing.  Fact Sheet for Patients: EntrepreneurPulse.com.au  Fact Sheet for Healthcare Providers: IncredibleEmployment.be  This test is not yet approved or cleared by the [place] FDA and has been authorized for detection and/or diagnosis of SARS-CoV-2 by FDA under an Emergency Use Authorization (EUA). This EUA will remain in effect (meaning this test can be used) for the duration of the COVID-19 declaration under Section 564(b)(1) of the Act, 21 U.S.C. section 360bbb-3(b)(1), unless the authorization is terminated or revoked.  Performed at [facility], [place] [address] [place], [place] [address]     Procedures/Studies: DG Chest 1 View  Result Date: 09/09/2020 CLINICAL DATA:  Post left thoracentesis EXAM: CHEST  1 VIEW COMPARISON:  09/08/2020 FINDINGS: Significant improvement in left pleural effusion. Improvement in left lower lobe atelectasis. No pneumothorax Right lung remains clear without effusion on the right. IMPRESSION: No complication post left thoracentesis. Electronically Signed   By: Franchot Gallo M.D.   On: 09/09/2020 16:41   DG Chest 1 View  Result Date: 09/04/2020 CLINICAL DATA:  87 year old male with a history of thoracentesis EXAM: CHEST  1 VIEW COMPARISON:  09/03/2020 FINDINGS: Cardiomediastinal silhouette unchanged with cardiomegaly. Similar appearance of mild central vascular congestion. Improved opacity at the left lung base, right better visualization of the left heart and improved aeration at the base. No pneumothorax. No new confluent airspace disease. Surgical changes of the cervical region incompletely imaged IMPRESSION: Negative for complicating features status post  left thoracentesis. Electronically Signed   By: Corrie Mckusick D.O.   On: 09/04/2020 12:31   CT Chest Wo Contrast  Result Date: 09/03/2020 CLINICAL DATA:  Pleural effusion EXAM: CT CHEST WITHOUT CONTRAST TECHNIQUE: Multidetector CT imaging of the chest was performed following the standard protocol without IV contrast. COMPARISON:  04/30/2009 FINDINGS: Cardiovascular: Mild coronary artery calcification. Global cardiac size is mildly enlarged with mild left ventricular dilation. Hypoattenuation of the cardiac blood pool is in keeping with at least mild anemia. No pericardial effusion. The central pulmonary arteries are enlarged in keeping with changes of pulmonary arterial hypertension. Moderate atherosclerotic calcification is seen within the thoracic aorta. No aortic aneurysm identified. Mediastinum/Nodes: The visualized thyroid is unremarkable. No pathologic thoracic adenopathy. The esophagus is unremarkable. Lungs/Pleura: Large left pleural effusion is present with complete collapse of the left lower lobe and compressive atelectasis of the lingula. Minimal ground-glass infiltrate and traction bronchiolectasis within the right apex likely represents mild fibrotic change. No additional superimposed focal pulmonary infiltrate. No pneumothorax. No pleural effusion on the right. The central  airways are widely patent. Upper Abdomen: No acute abnormality. Musculoskeletal: No acute bone abnormality. No lytic or blastic bone lesion. IMPRESSION: Large left pleural effusion with collapse of the left lower lobe. Mild coronary artery calcification. Mild cardiomegaly with left ventricular dilation. Morphologic changes in keeping with pulmonary arterial hypertension. Mild anemia Aortic Atherosclerosis (ICD10-I70.0). Electronically Signed   By: Fidela Salisbury MD   On: 09/03/2020 23:50   US RENAL  Result Date: 09/04/2020 CLINICAL DATA:  Acute kidney failure, history dementia, hypertension, former smoker EXAM: ULTRASOUND  RENAL LIMITED COMPARISON:  01/30/2016 FINDINGS: Right Kidney: Renal measurements: 7.8 x 4.0 x 6.5 cm = volume: 109 mL. Cortical thinning. Increased cortical echogenicity. Small cyst at upper pole 16 x 17 x 18 mm. No additional mass or hydronephrosis. No shadowing calcification. Left Kidney: Patient uncooperative/combative, unable to image LEFT kidney. Procedure terminated. IMPRESSION: Medical renal disease changes of RIGHT kidney with small cyst at upper pole. Premature termination of exam due to patient noncompliance, LEFT kidney not imaged. Electronically Signed   By: Lavonia Dana M.D.   On: 09/04/2020 14:31   DG CHEST PORT 1 VIEW  Result Date: 09/11/2020 CLINICAL DATA:  Pleural effusion EXAM: PORTABLE CHEST 1 VIEW COMPARISON:  09/10/2020 FINDINGS: Moderate layering left pleural effusion, grossly unchanged. Associated left lower lobe opacity, likely compressive atelectasis. Right lung is clear. No pneumothorax. Heart is normal in size. Cervical spine fixation hardware, incompletely visualized. IMPRESSION: Moderate layering left pleural effusion, grossly unchanged. Associated left lower lobe opacity, likely compressive atelectasis. Electronically Signed   By: Julian Hy M.D.   On: 09/11/2020 07:54   DG CHEST PORT 1 VIEW  Result Date: 09/10/2020 CLINICAL DATA:  Pleural effusion.  Confusion. EXAM: PORTABLE CHEST 1 VIEW COMPARISON:  Yesterday FINDINGS: Increased versus preferential inferior accumulation of the left pleural effusion. Underlying left pulmonary opacity persists. Clear right lung. Stable heart size. IMPRESSION: Increased versus re-distributed left pleural effusion which is at least moderate volume. Electronically Signed   By: Monte Fantasia M.D.   On: 09/10/2020 07:53   DG CHEST PORT 1 VIEW  Result Date: 09/08/2020 CLINICAL DATA:  Shortness of breath. EXAM: PORTABLE CHEST 1 VIEW COMPARISON:  09/04/2020. FINDINGS: Patient rotated to the right. Cardiomegaly. Mild bilateral interstitial  prominence. Large left pleural effusion, increased in size from prior exam. No pneumothorax. Prior cervicothoracic spine fusion. Degenerative change thoracic spine. IMPRESSION: 1. Cardiomegaly. Bilateral interstitial prominence. Interstitial edema could present this fashion. 2.  Large left pleural effusion, increased in size from prior exam. Electronically Signed   By: [place]   On: 09/08/2020 15:26   DG Chest Port 1 View  Result Date: 09/03/2020 CLINICAL DATA:  Altered mental status. EXAM: PORTABLE CHEST 1 VIEW COMPARISON:  June 19, 2019 FINDINGS: There is opacification of the left lung base and mid to lower left lung. A moderate size left pleural effusion is seen. No pneumothorax is identified. The cardiac silhouette is moderately enlarged. There is moderate severity calcification of the aortic arch. Degenerative changes seen throughout the thoracic spine. IMPRESSION: 1. Cardiomegaly with additional findings likely consistent with marked severity left basilar atelectasis and/or infiltrate. Follow-up to resolution is recommended, as an underlying neoplastic process cannot be excluded. 2. Moderate sized left-sided pleural effusion. Electronically Signed   By: Virgina Norfolk M.D.   On: 09/03/2020 20:42   DG Swallowing Func-Speech Pathology  Result Date: 09/09/2020 Objective Swallowing Evaluation: Type of Study: MBS-Modified Barium Swallow Study  Patient Details Name: Dakarie Decardenas MRN: [id number] Date of Birth: December 03, 1923 Today's  Date: 09/09/2020 Time: SLP Start Time (ACUTE ONLY): 0945 -SLP Stop Time (ACUTE ONLY): 1000 SLP Time Calculation (min) (ACUTE ONLY): 15 min Past Medical History: Past Medical History: Diagnosis Date . BPH (benign prostatic hyperplasia)  . Chronic kidney disease   ckd stage 3 . Dementia ([place])  . GERD (gastroesophageal reflux disease)  . Hemorrhoids  . History of angina  . Hyperlipemia  . Hypertension  . Irregular heart rhythm  . Paroxysmal A-fib ([place]) 04/02/2015 Past  Surgical History: Past Surgical History: Procedure Laterality Date . BACK SURGERY   . CERVICAL SPINE SURGERY   . IR THORACENTESIS ASP PLEURAL SPACE W/IMG GUIDE  09/04/2020 . SPINE SURGERY   HPI: Pt is a 87 y.o. male with medical history significant of advanced dementia, CKD 4, HTN, PAF not on anticoagulation. Pt presented to the ED with c/o SOB and and poor oral intake over the past week. CT chest 4/14: Large left pleural effusion with collapse of the left lower lobe. [name] in December, 2019; pt demonstrated impulsivity and a dysphagia 2 diet with thin liquids was recommended at this time.  No data recorded Assessment / Plan / Recommendation CHL IP CLINICAL IMPRESSIONS 09/09/2020 Clinical Impression Pt presents with mild-moderate oropharygeal dysphagia, with probable impact of baseline cognitive deficits, c/b impaired mastication of mechanical soft solids and penetration/aspiration of liquids. Thin liquids via cup resulted in trace-moderate silent aspiration (PAS 8) and clinican was unable to elicit cued cough from pt. Nectar thick liquids via cup resulted in penetration during the swallow (PAS 3) and mild residue in pyriform sinuses which was cleared with subsequent independent swallows. Use of straw with nectar liquids only decreased bolus cohesion/manipulation and premature spillage of liquids and subsequent aspiration (PAS 8) noted x1. Penetration/aspiration eliminated when nectar thick liquids provided via cup and administered in small amounts by clinician. Solids WFL, except for perseveration on mastication of mechanical soft solids requiring clincian to offer liquid wash and verbal cues to initiate swallow. Recommend dysphagia 1, NTL (no straw) diet with full supervision and assist for all meals to provide small sips/bites at slow rate and alternate liquids with solids. Will f/u for continued tolerance and education. SLP Visit Diagnosis Dysphagia, oropharyngeal phase (R13.12) Attention and  concentration deficit following -- Frontal lobe and executive function deficit following -- Impact on safety and function Moderate aspiration risk   CHL IP TREATMENT RECOMMENDATION 09/09/2020 Treatment Recommendations Therapy as outlined in treatment plan below   Prognosis 09/09/2020 Prognosis for Safe Diet Advancement Fair Barriers to Reach Goals Cognitive deficits;Severity of deficits Barriers/Prognosis Comment -- CHL IP DIET RECOMMENDATION 09/09/2020 SLP Diet Recommendations Dysphagia 1 (Puree) solids;Nectar thick liquid Liquid Administration via Cup;No straw Medication Administration Crushed with puree Compensations Minimize environmental distractions;Slow rate;Small sips/bites Postural Changes Remain semi-upright after after feeds/meals (Comment);Seated upright at 90 degrees   CHL IP OTHER RECOMMENDATIONS 09/09/2020 Recommended Consults -- Oral Care Recommendations Oral care BID;Staff/trained caregiver to provide oral care Other Recommendations Order thickener from pharmacy   CHL IP FOLLOW UP RECOMMENDATIONS 09/09/2020 Follow up Recommendations 24 hour supervision/assistance;Skilled Nursing facility   [facility] IP FREQUENCY AND DURATION 09/09/2020 Speech Therapy Frequency (ACUTE ONLY) min 2x/week Treatment Duration 2 weeks      CHL IP ORAL PHASE 09/09/2020 Oral Phase Impaired Oral - Pudding Teaspoon -- Oral - Pudding Cup -- Oral - Honey Teaspoon -- Oral - Honey Cup -- Oral - Nectar Teaspoon -- Oral - Nectar Cup -- Oral - Nectar Straw -- Oral - Thin Teaspoon NT Oral -  Thin Cup [facility] Oral - Thin Straw Premature spillage Oral - Puree WFL Oral - Mech Soft Other (Comment);Impaired mastication Oral - Regular NT Oral - Multi-Consistency NT Oral - Pill NT Oral Phase - Comment --  CHL IP PHARYNGEAL PHASE 09/09/2020 Pharyngeal Phase Impaired Pharyngeal- Pudding Teaspoon -- Pharyngeal -- Pharyngeal- Pudding Cup -- Pharyngeal -- Pharyngeal- Honey Teaspoon -- Pharyngeal -- Pharyngeal- Honey Cup -- Pharyngeal -- Pharyngeal- Nectar Teaspoon  NT Pharyngeal -- Pharyngeal- Nectar Cup Penetration/Aspiration during swallow;Delayed swallow initiation-pyriform sinuses;Pharyngeal residue - valleculae;Trace aspiration Pharyngeal Material enters airway, remains ABOVE vocal cords and not ejected out;Material enters airway, passes BELOW cords without attempt by patient to eject out (silent aspiration) Pharyngeal- Nectar Straw Pharyngeal residue - valleculae;Delayed swallow initiation-pyriform sinuses Pharyngeal Material enters airway, remains ABOVE vocal cords and not ejected out Pharyngeal- Thin Teaspoon NT Pharyngeal -- Pharyngeal- Thin Cup Delayed swallow initiation-pyriform sinuses;Trace aspiration;Moderate aspiration;Penetration/Aspiration during swallow Pharyngeal Material enters airway, passes BELOW cords without attempt by patient to eject out (silent aspiration);Material enters airway, passes BELOW cords and not ejected out despite cough attempt by patient Pharyngeal- Thin Straw NT Pharyngeal -- Pharyngeal- Puree WFL Pharyngeal -- Pharyngeal- Mechanical Soft WFL Pharyngeal -- Pharyngeal- Regular NT Pharyngeal -- Pharyngeal- Multi-consistency NT Pharyngeal -- Pharyngeal- Pill NT Pharyngeal -- Pharyngeal Comment --  CHL IP CERVICAL ESOPHAGEAL PHASE 09/09/2020 Cervical Esophageal Phase WFL Pudding Teaspoon -- Pudding Cup -- Honey Teaspoon -- Honey Cup -- Nectar Teaspoon -- Nectar Cup -- Nectar Straw -- Thin Teaspoon -- Thin Cup -- Thin Straw -- Puree -- Mechanical Soft -- Regular -- Multi-consistency -- Pill -- Cervical Esophageal Comment -- Ellwood Dense, MA, CCC-SLP Acute Rehabilitation Services Office Number: [phone] Acie Fredrickson 09/09/2020, 12:42 PM              IR THORACENTESIS ASP PLEURAL SPACE W/IMG GUIDE  Result Date: 09/09/2020 INDICATION: Acute hypoxic respiratory failure due to large left-sided pleural effusion and possible left lower lobe community-acquired pneumonia. Request for therapeutic thoracentesis. EXAM: ULTRASOUND GUIDED LEFT  THORACENTESIS MEDICATIONS: 1% lidocaine 10 mL COMPLICATIONS: None immediate. PROCEDURE: An ultrasound guided thoracentesis was thoroughly discussed with the patient and questions answered. The benefits, risks, alternatives and complications were also discussed. The patient understands and wishes to proceed with the procedure. Written consent was obtained. Ultrasound was performed to localize and mark an adequate pocket of fluid in the left chest. The area was then prepped and draped in the normal sterile fashion. 1% Lidocaine was used for local anesthesia. Under ultrasound guidance a 6 Fr Safe-T-Centesis catheter was introduced. Thoracentesis was performed. The catheter was removed and a dressing applied. FINDINGS: A total of approximately 1.7 L of bloody fluid was removed. Procedure stopped due to patient coughing. IMPRESSION: Successful ultrasound guided left thoracentesis yielding 1.7 L of pleural fluid. Read by: Gareth Eagle, PA-C Electronically Signed   By: Miachel Roux M.D.   On: 09/09/2020 16:22   IR THORACENTESIS ASP PLEURAL SPACE W/IMG GUIDE  Result Date: 09/04/2020 INDICATION: Patient with history of dementia and chronic kidney disease. Admitted to the hospital with shortness of breath. Found to have left-sided pleural effusion. Request is for therapeutic and diagnostic left-sided thoracentesis. EXAM: ULTRASOUND GUIDED THERAPEUTIC AND DIAGNOSTIC LEFT-SIDED THORACENTESIS MEDICATIONS: Lidocaine 1% 10 mL COMPLICATIONS: None immediate. PROCEDURE: An ultrasound guided thoracentesis was thoroughly discussed with the patient and questions answered. The benefits, risks, alternatives and complications were also discussed. The patient understands and wishes to proceed with the procedure. Written consent was obtained. Ultrasound was performed to localize and mark  an adequate pocket of fluid in the left chest. The area was then prepped and draped in the normal sterile fashion. 1% Lidocaine was used for local  anesthesia. Under ultrasound guidance a 6 Fr Safe-T-Centesis catheter was introduced. Thoracentesis was performed. The catheter was removed and a dressing applied. FINDINGS: A total of approximately 1.5 L of amber colored fluid was removed. Samples were sent to the laboratory as requested by the clinical team. IMPRESSION: Successful ultrasound guided therapeutic and diagnostic left-sided thoracentesis thoracentesis yielding 1.5 L of pleural fluid. Read by: Rushie Nyhan, NP Electronically Signed   By: Jacqulynn Cadet M.D.   On: 09/04/2020 12:50   IR PERC PLEURAL DRAIN W/INDWELL CATH W/IMG GUIDE  Result Date: 09/14/2020 INDICATION: 87 year old gentleman with CHF and recurrent left pleural effusion presents to interventional radiology for chest PleurX placement prior to admission to hospice. EXAM: Ultrasound-guided left PleurX placement. MEDICATIONS: The patient is currently admitted to the hospital and receiving intravenous antibiotics. The antibiotics were administered within an appropriate time frame prior to the initiation of the procedure. ANESTHESIA/SEDATION: Fentanyl 25 mcg IV; Versed 1.5 mg IV Moderate Sedation Time:  16 minutes The patient was continuously monitored during the procedure by the interventional radiology nurse under my direct supervision. COMPLICATIONS: None immediate. PROCEDURE: Informed written consent was obtained from the patient's daughter, Claudine Mouton, after a thorough discussion of the procedural risks, benefits and alternatives. All questions were addressed. Maximal Sterile Barrier Technique was utilized including caps, mask, sterile gowns, sterile gloves, sterile drape, hand hygiene and skin antiseptic. A timeout was performed prior to the initiation of the procedure. Ultrasound examination demonstrated moderate amount of effusion in the left pleural space. Ultrasound image of the left pleural effusion was obtained and placed in permanent medical record. Overlying skin was prepped  and draped in the usual sterile fashion. Following local lidocaine administration, the left pleural space was accessed with a sheathed needle. PleurX catheter was brought to the access site through a short subcutaneous tunnel. Peel-away sheath placed and PleurX catheter inserted through the peel-away sheath. Approximately 1,200 mL of bloody fluid was removed. The access site was closed with Dermabond. The PleurX catheter was secured at the skin entry site with non-absorbable silk suture. Sterile dressing applied over the insertion site. IMPRESSION: Left chest PleurX catheter placement Electronically Signed   By: Miachel Roux M.D.   On: 09/14/2020 16:02    Labs: BNP (last 3 results) Recent Labs    09/03/20 2027  BNP [id number]*   Basic Metabolic Panel: Recent Labs  Lab 09/11/20 0302 09/14/20 1942 09/15/20 1644 09/16/20 0152  NA 149* 152*  --   --   K 5.0 6.0* 4.2 5.3*  CL 122* 121*  --   --   CO2 20* 19*  --   --   GLUCOSE 84 138* 108*  --   BUN 42* 48*  --   --   CREATININE 3.28* 3.35*  --   --   CALCIUM 8.3* 8.4*  --   --    Liver Function Tests: No results for input(s): AST, ALT, ALKPHOS, BILITOT, PROT, ALBUMIN in the last 168 hours. No results for input(s): LIPASE, AMYLASE in the last 168 hours. No results for input(s): AMMONIA in the last 168 hours. CBC: Recent Labs  Lab 09/11/20 0302  WBC 3.6*  NEUTROABS 2.7  HGB 7.2*  HCT 24.8*  MCV 109.7*  PLT 196   Cardiac Enzymes: No results for input(s): CKTOTAL, CKMB, CKMBINDEX, TROPONINI in the last 168 hours. BNP: Invalid  input(s): POCBNP CBG: Recent Labs  Lab 09/15/20 1506 09/15/20 1624 09/15/20 1709 09/15/20 1804 09/15/20 2015  GLUCAP 134* 158* 104* 109* 87   D-Dimer No results for input(s): DDIMER in the last 72 hours. Hgb A1c No results for input(s): HGBA1C in the last 72 hours. Lipid Profile No results for input(s): CHOL, HDL, LDLCALC, TRIG, CHOLHDL, LDLDIRECT in the last 72 hours. Thyroid function studies No  results for input(s): TSH, T4TOTAL, T3FREE, THYROIDAB in the last 72 hours.  Invalid input(s): FREET3 Anemia work up No results for input(s): VITAMINB12, FOLATE, FERRITIN, TIBC, IRON, RETICCTPCT in the last 72 hours. Urinalysis    Component Value Date/Time   COLORURINE YELLOW 06/20/2019 0630   APPEARANCEUR HAZY (A) 06/20/2019 0630   LABSPEC 1.013 06/20/2019 0630   PHURINE 5.0 06/20/2019 0630   GLUCOSEU NEGATIVE 06/20/2019 0630   HGBUR SMALL (A) 06/20/2019 0630   BILIRUBINUR NEGATIVE 06/20/2019 0630   KETONESUR NEGATIVE 06/20/2019 0630   PROTEINUR 100 (A) 06/20/2019 0630   UROBILINOGEN 0.2 04/02/2015 1430   NITRITE NEGATIVE 06/20/2019 0630   LEUKOCYTESUR NEGATIVE 06/20/2019 0630   Sepsis Labs Invalid input(s): PROCALCITONIN,  WBC,  LACTICIDVEN Microbiology Recent Results (from the past 240 hour(s))  SARS CORONAVIRUS 2 (TAT 6-24 HRS) Nasopharyngeal Nasopharyngeal Swab     Status: None   Collection Time: 09/08/20  9:38 AM   Specimen: Nasopharyngeal Swab  Result Value Ref Range Status   SARS Coronavirus 2 NEGATIVE NEGATIVE Final    Comment: (NOTE) SARS-CoV-2 target nucleic acids are NOT DETECTED.  The SARS-CoV-2 RNA is generally detectable in upper and lower respiratory specimens during the acute phase of infection. Negative results do not preclude SARS-CoV-2 infection, do not rule out co-infections with other pathogens, and should not be used as the sole basis for treatment or other patient management decisions. Negative results must be combined with clinical observations, patient history, and epidemiological information. The expected result is Negative.  Fact Sheet for Patients: SugarRoll.be  Fact Sheet for Healthcare Providers: https://www.woods-mathews.com/  This test is not yet approved or cleared by the [place] FDA and  has been authorized for detection and/or diagnosis of SARS-CoV-2 by FDA under an Emergency Use  Authorization (EUA). This EUA will remain  in effect (meaning this test can be used) for the duration of the COVID-19 declaration under Se ction 564(b)(1) of the Act, 21 U.S.C. section 360bbb-3(b)(1), unless the authorization is terminated or revoked sooner.  Performed at [facility], [place] [address] [place], [place] [address]   Resp Panel by RT-PCR (Flu A&B, Covid) Nasopharyngeal Swab     Status: None   Collection Time: 09/15/20 11:12 AM   Specimen: Nasopharyngeal Swab; Nasopharyngeal(NP) swabs in vial transport medium  Result Value Ref Range Status   SARS Coronavirus 2 by RT PCR NEGATIVE NEGATIVE Final    Comment: (NOTE) SARS-CoV-2 target nucleic acids are NOT DETECTED.  The SARS-CoV-2 RNA is generally detectable in upper respiratory specimens during the acute phase of infection. The lowest concentration of SARS-CoV-2 viral copies this assay can detect is 138 copies/mL. A negative result does not preclude SARS-Cov-2 infection and should not be used as the sole basis for treatment or other patient management decisions. A negative result may occur with  improper specimen collection/handling, submission of specimen other than nasopharyngeal swab, presence of viral mutation(s) within the areas targeted by this assay, and inadequate number of viral copies(<138 copies/mL). A negative result must be combined with clinical observations, patient history, and epidemiological information. The expected result  is Negative.  Fact Sheet for Patients:  EntrepreneurPulse.com.au  Fact Sheet for Healthcare Providers:  IncredibleEmployment.be  This test is no t yet approved or cleared by the [place] FDA and  has been authorized for detection and/or diagnosis of SARS-CoV-2 by FDA under an Emergency Use Authorization (EUA). This EUA will remain  in effect (meaning this test can be used) for the duration of the COVID-19 declaration under Section  564(b)(1) of the Act, 21 U.S.C.section 360bbb-3(b)(1), unless the authorization is terminated  or revoked sooner.       Influenza A by PCR NEGATIVE NEGATIVE Final   Influenza B by PCR NEGATIVE NEGATIVE Final    Comment: (NOTE) The Xpert Xpress SARS-CoV-2/FLU/RSV plus assay is intended as an aid in the diagnosis of influenza from Nasopharyngeal swab specimens and should not be used as a sole basis for treatment. Nasal washings and aspirates are unacceptable for Xpert Xpress SARS-CoV-2/FLU/RSV testing.  Fact Sheet for Patients: EntrepreneurPulse.com.au  Fact Sheet for Healthcare Providers: IncredibleEmployment.be  This test is not yet approved or cleared by the [place] FDA and has been authorized for detection and/or diagnosis of SARS-CoV-2 by FDA under an Emergency Use Authorization (EUA). This EUA will remain in effect (meaning this test can be used) for the duration of the COVID-19 declaration under Section 564(b)(1) of the Act, 21 U.S.C. section 360bbb-3(b)(1), unless the authorization is terminated or revoked.  Performed at [facility], [place] [address] [place], [place] [address]      Time coordinating discharge: 35  minutes  SIGNED: Antonieta Pert, MD  [facility] 09/17/2020, 1:07 PM  If 7PM-7AM, please contact night-coverage [email or web address]

## 2020-09-15 NOTE — Plan of Care (Signed)
  Problem: Education: Goal: Knowledge of disease and its progression will improve Outcome: Progressing   Problem: Health Behavior/Discharge Planning: Goal: Ability to manage health-related needs will improve Outcome: Progressing   Problem: Clinical Measurements: Goal: Complications related to the disease process or treatment will be avoided or minimized Outcome: Progressing Goal: Dialysis access will remain free of complications Outcome: Progressing   Problem: Activity: Goal: Activity intolerance will improve Outcome: Progressing   Problem: Fluid Volume: Goal: Fluid volume balance will be maintained or improved Outcome: Progressing   Problem: Nutritional: Goal: Ability to make appropriate dietary choices will improve Outcome: Progressing   Problem: Respiratory: Goal: Respiratory symptoms related to disease process will be avoided Outcome: Progressing   Problem: Self-Concept: Goal: Body image disturbance will be avoided or minimized Outcome: Progressing   Problem: Urinary Elimination: Goal: Progression of disease will be identified and treated Outcome: Progressing   Problem: Education: Goal: Knowledge of General Education information will improve Description: Including pain rating scale, medication(s)/side effects and non-pharmacologic comfort measures Outcome: Progressing   Problem: Health Behavior/Discharge Planning: Goal: Ability to manage health-related needs will improve Outcome: Progressing   Problem: Clinical Measurements: Goal: Ability to maintain clinical measurements within normal limits will improve Outcome: Progressing Goal: Will remain free from infection Outcome: Progressing Goal: Diagnostic test results will improve Outcome: Progressing Goal: Respiratory complications will improve Outcome: Progressing Goal: Cardiovascular complication will be avoided Outcome: Progressing   Problem: Activity: Goal: Risk for activity intolerance will  decrease Outcome: Progressing   Problem: Nutrition: Goal: Adequate nutrition will be maintained Outcome: Progressing   Problem: Coping: Goal: Level of anxiety will decrease Outcome: Progressing   Problem: Elimination: Goal: Will not experience complications related to bowel motility Outcome: Progressing Goal: Will not experience complications related to urinary retention Outcome: Progressing   Problem: Pain Managment: Goal: General experience of comfort will improve Outcome: Progressing   Problem: Safety: Goal: Ability to remain free from injury will improve Outcome: Progressing   Problem: Skin Integrity: Goal: Risk for impaired skin integrity will decrease Outcome: Progressing   

## 2020-09-15 NOTE — TOC Progression Note (Addendum)
Transition of Care [facility]) - Progression Note    Patient Details  Name: Wayne Fuller MRN: [id number] Date of Birth: 1924/01/04  Transition of Care [facility]) CM/SW [place], RN Phone Number: [phone]  09/15/2020, 8:45 AM  Clinical Narrative:    [facility] consulted for [facility] RN and need for pleurx drains. 1 box of pleurx drain bottles delivered to the bedside to go with patient at discharge. Per Chrislyn with [facility] they will manage the pleurx once the patient returns to [facility]. CM has attempted to call [facility] to determine that facility is ready to receive patient back to their facility. Message has been left for [facility] will await return call.         Expected Discharge Plan and Services                                                 Social Determinants of Health (SDOH) Interventions    Readmission Risk Interventions No flowsheet data found.

## 2020-09-15 NOTE — NC FL2 (Addendum)
[place] LEVEL OF CARE SCREENING TOOL     IDENTIFICATION  Patient Name: Wayne Fuller Birthdate: 11/11/1923 Sex: male Admission Date (Current Location): 09/03/2020  [facility] and [place] Number:  Engineering geologist and Address:  [facility] [place]. [facility], [place] [address], [place], [place] [address]      Provider Number: [id number]  Attending Physician Name and Address:  Antonieta Pert, MD  Relative Name and Phone Number:  Ulyses Trick (daughter) [phone]    Current Level of Care: Hospital Recommended Level of Care: Assisted Living [facility] Care Prior Approval Number:    Date Approved/Denied:   PASRR Number:    Discharge Plan: Other (Comment) (Assisted living/ memory care)    Current Diagnoses: Patient Active Problem List   Diagnosis Date Noted  . Acute kidney failure ([place]) 09/03/2020  . Pleural effusion on left 09/03/2020  . HCAP (healthcare-associated pneumonia) 04/22/2018  . Hypoglycemia 04/22/2018  . S/p left hip fracture 09/29/2016  . Dehydration   . 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 Dislocation of internal left hip prosthesis ([place]) 09/21/2016  . Hyperkalemia 09/21/2016  . Protein-calorie malnutrition, severe 09/21/2016  . Anemia, chronic disease 01/30/2016  . Acute kidney injury superimposed on CKD ([place]) 01/30/2016  . Dementia with behavioral disturbance ([place]) 01/19/2016  . Back pain 01/19/2016  . Elevated troponin 04/02/2015  . Paroxysmal A-fib ([place]) 04/02/2015  . CKD (chronic kidney disease) stage 4, GFR 15-29 ml/min (HCC) 08/11/2014  . GERD (gastroesophageal reflux disease) 08/11/2014  . Essential hypertension 04/29/2009  . CAD (coronary artery disease) 04/29/2009  . TOBACCO ABUSE, HX OF 04/29/2009    Orientation RESPIRATION BLADDER Height & Weight      (Advanced Dementia)  Normal Incontinent Weight: 81 kg Height:  6' 2.02" (188 cm)  BEHAVIORAL SYMPTOMS/MOOD NEUROLOGICAL BOWEL  NUTRITION STATUS     (n/a) Incontinent Diet puree with nectar think liquids  AMBULATORY STATUS COMMUNICATION OF NEEDS Skin   Total Care (wheelchair bound) Verbally (speech slurred words incomprehinsible at times) Normal                       Personal Care Assistance Level of Assistance  Bathing,Feeding,Dressing,Total care Bathing Assistance: Maximum assistance Feeding assistance: Limited assistance Dressing Assistance: Maximum assistance     Functional Limitations Info  Speech,Hearing,Sight Sight Info: Adequate Hearing Info: Adequate Speech Info: Impaired (rambling speech/ confused)    SPECIAL CARE FACTORS FREQUENCY                 Contractures Contractures Info: Not present    Additional Factors Info  Code Status,Allergies,Psychotropic,Insulin Sliding Scale,Isolation Precautions,Suctioning Needs Code Status Info: DNR Allergies Info: No known drug allergies Psychotropic Info: see d/c summary Insulin Sliding Scale Info: see d/c summary for sliding scale info Isolation Precautions Info: n/a Suctioning Needs: n/a   Current Medications (09/15/2020):   Medication List    STOP taking these medications   acetaminophen 500 MG tablet Commonly known as: TYLENOL   alum & mag hydroxide-simeth [id number] MG/5ML suspension Commonly known as: MAALOX/MYLANTA   amiodarone 200 MG tablet Commonly known as: PACERONE   amLODipine 10 MG tablet Commonly known as: NORVASC   aspirin EC 81 MG tablet   BAZA PROTECT EX   famotidine 20 MG tablet Commonly known as: PEPCID   ferrous sulfate 325 (65 FE) MG tablet   guaifenesin 100 MG/5ML syrup Commonly known as: ROBITUSSIN   hydrALAZINE 100 MG tablet Commonly known as: APRESOLINE   loperamide  2 MG capsule Commonly known as: IMODIUM   magnesium hydroxide 400 MG/5ML suspension Commonly known as: MILK OF MAGNESIA   memantine 5 MG tablet Commonly known as: NAMENDA   sodium bicarbonate 650 MG tablet    sodium chloride 0.65 % Soln nasal spray Commonly known as: OCEAN   Vitamin D (Cholecalciferol) 25 MCG (1000 UT) Caps     TAKE these medications   albuterol 108 (90 Base) MCG/ACT inhaler Commonly known as: VENTOLIN HFA Inhale 4 puffs into the lungs once for 1 dose.   melatonin 3 MG Tabs tablet Take 3 mg by mouth at bedtime. For sleep   metoprolol succinate 25 MG 24 hr tablet Commonly known as: TOPROL-XL Take 1 tablet (25 mg total) by mouth daily.   polyethylene glycol 17 g packet Commonly known as: MiraLax Take 17 g by mouth daily.   QUEtiapine 25 MG tablet Commonly known as: SEROQUEL Take 0.5 tablets (12.5 mg total) by mouth at bedtime.   tamsulosin 0.4 MG Caps capsule Commonly known as: FLOMAX TAKE 1 CAPSULE(0.4 MG) BY MOUTH DAILY AFTER SUPPER What changed: See the new instructions.   Triple Antibiotic [phone] Oint Apply 1 application topically as needed (minor skin tears or abrasions). Clean area with normal saline, apply triple antibiotic, cover with bandaid or gauze and tape, change as needed until healed.       Discharge Medications: Please see discharge summary for a list of discharge medications.  Relevant Imaging Results:  Relevant Lab Results:   Additional Information SS # [id number]  Angelita Ingles, RN

## 2020-09-15 NOTE — TOC Progression Note (Addendum)
Transition of Care [facility]) - Progression Note    Patient Details  Name: Wayne Fuller MRN: [id number] Date of Birth: 15-Aug-1923  [facility] CM/SW Contact  Angelita Ingles, RN Phone Number: [phone]  09/15/2020, 12:50 PM  Clinical Narrative:    CM received call from Perry at [facility] requesting update to diet and PT on FL2. Info has been updated and faxed back to [place]. CM received message from primary nurse to report that [facility] wants to come to facility to see patient before making a final decision. CM has attempted to call Crystal to speak with her about this matter but Crystal does not answer the phone. Message has been left. Will continue to attempt to contact Crystal.   1300 CM spoke with Crystal at [facility]. Crystal states that she wants to see patient to get a clear picture to make sure the facility can meet patients needs.   1400 Message received from patients nurse to report that patient will not be discharged today. D/c has been cancelled. [facility] made aware.  1450 CM attempted to call daughter Claudine Mouton to discuss disposition. No answer. Message left. Will await return call.       Expected Discharge Plan and Services           Expected Discharge Date: 09/15/20                                     Social Determinants of Health (SDOH) Interventions    Readmission Risk Interventions No flowsheet data found.

## 2020-09-16 LAB — POTASSIUM: Potassium: 5.3 mmol/L — ABNORMAL HIGH (ref 3.5–5.1)

## 2020-09-16 NOTE — Progress Notes (Signed)
Nutrition Brief Note  Chart reviewed. Pt now transitioning to comfort care.  No further nutrition interventions planned at this time.  Please re-consult as needed.   Elainna Eshleman, MS, RD, LDN RD pager number and weekend/on-call pager number located in Amion.    

## 2020-09-16 NOTE — Progress Notes (Signed)
Palliative:  HPI: 87 y.o. male  with past medical history of dementia, hypertension, atrial fibrillation not on any anticoagulation, CKD stage 4 admitted on 09/03/2020 with breathing problems diagnosed with possible pneumonia and large left sided pleural effusion s/p thoracentesis 1500 cc removed. Also with worsening renal function.   I met today with son, Carl, at Mr. Island's bedside. We discussed overall poor prognosis as he is unable to maintain enough hydration to keep his kidneys happy. As soon as IVF stopped his electrolytes increase (sodium and potassium) dangerously high and his creatine rises. Mr. Peppel appears more weak and lethargic to me today and he continues to decline. He is still eating his meals well at times but still not enough hydration. We had a discussion regarding poor prognosis and that his issues are really irreversible. They understand that IV fluids are only a temporary fix to a larger problem. Carl understands that his father is approaching end of life. We spent time discussing what is more important during this time. Carl states that family want to ensure that PleurX can be maintained and drained and that his father gets good care during this period in his life. We discussed hospice in place at [facility] vs hospice facility and that I would not expect that his prognosis would be altered at one location vs the other. Carl states that the majority of his siblings have been leaning more towards desire for [facility] if this is an option. Given Mr. Mathenia's progression and significant electrolyte abnormalities off IV fluids I do feel this would be an appropriate option to ensure that he is comfortable at end of life. Family confirm desire for hospice facility given poor prognosis. They do want to ensure that PleurX is drained as needed as a comfort measure.   Exam: Lethargic, awakens to voice. Confused. Head nodding/cervical dyskinesia present but much less pronounced today. No  distress. Breathing regular, unlabored. Lying in fetal position.   Plan: - Family desire hospice facility (may be open to [facility] or [facility]).  - Hospice facility appropriate given poor prognosis < 2 weeks likely with significant electrolyte changes and renal failure when off IV fluids.  - Family desire PleurX to continue to be drained to ensure comfort.   25 min   , NP Palliative Medicine Team Pager [phone] (Please see [email or web address] for schedule) Team Phone [phone]    Greater than 50%  of this time was spent counseling and coordinating care related to the above assessment and plan  

## 2020-09-16 NOTE — Plan of Care (Signed)
  Problem: Fluid Volume: Goal: Fluid volume balance will be maintained or improved Outcome: Progressing   Problem: Nutritional: Goal: Ability to make appropriate dietary choices will improve Outcome: Progressing   Problem: Respiratory: Goal: Respiratory symptoms related to disease process will be avoided Outcome: Progressing   Problem: Urinary Elimination: Goal: Progression of disease will be identified and treated Outcome: Progressing

## 2020-09-16 NOTE — Progress Notes (Signed)
Manufacturing engineer [facility]) Hospital Liaison note.    Received request from Mar[place] for family interest in [facility] vs returning to [facility] with hospice support. Chart and pt information under review at this time by [facility] physician for [facility] eligibility.    [facility] is unable to offer a room today. Hospital Liaison will follow up tomorrow or sooner if a room becomes available. Please do not hesitate to call with questions.    Thank you for the opportunity to participate in this patient's care.  Domenic Moras, BSN, RN [facility] Liaison (listed on [place] under Hospice[facility])    [phone] (24h on call)

## 2020-09-16 NOTE — Progress Notes (Signed)
PROGRESS NOTE    Nedim Venn  [id number] DOB: 19-Jul-1923 DOA: 09/03/2020 PCP: [name], NP   Chief Complaint  Patient presents with  . Altered Mental Status  Brief Narrative: 29 male with significant history of advanced dementia, CKD stage IV, hypertension, PAF not on anticoagulation brought to the ED with shortness of breath for few days and SSI generalized weakness poor oral intake over the past week along with 2 days of nausea vomiting diarrhea due to viral illness that was going around in the skilled nursing facility between 1 to 2 weeks. Upon arrival to ED, he was hypoxic requiring oxygen and also had AKI on CKD stage IV with hyperkalemia.  COVID neg. he did have peaked T waves on EKG.  There was some concern about left lower lobe pneumonia on the chest x-ray for which he was started on Rocephin and Zithromax.  CT chest showed large left-sided pleural effusion.  He was admitted to hospital service.  IR was consulted and he underwent left thoracentesis with 1500 cc of urine.  Fluid analysis indicated transudative fluid.  Patient's oxygen demand improved.  He was then started on gentle IV hydration as his p.o. intake was not enough.  With that, his renal function started to improve as well.  He also developed acute on chronic anemia where his hemoglobin dropped to 6.1 on 09/05/2020 for which she received 1 unit of PRBC suspicion and since then his hemoglobin remained stable over 7.5.  Repeat chest x-ray was done on 09/08/2020 which once again showed large left-sided pleural effusion.  Another thoracentesis was done on 09/09/2020 with removal of 1.7 L of bloody fluid.  Palliative care has been following patient.  Repeat chest x-ray since last 2 days shows slow reaccumulation of the left pleural effusion again.  Palliative care has been following and after extensive discussion  patient/family opted to pursue comfort measures and Pleurx catheter placement.    Subjective:  Seen this  morning was sleeping comfortably with no head-bobbing Did wake up on calling and as he moved has some head bobbing too. He appeared comfortable.  Assessment & Plan:  Acute hypoxic respiratory failure due to large left-sided pleural effusion Possible left lower lobe community-acquired pneumonia Recurrent left sided pleural effusion S/p thoracentesis x2 with reaccumulation.Initially transudative fluid.  Patient completed 5 days of antibiotics on 4/20. After palliative care discussion planned  comfort measures w/ Pleurx catheter and was placed by IR  4/25.Hospice to manage Pleurx catheter.  Currently appears comfortable on room air.    AKI on CKD stage IV/metabolic acidosis-creatinine has bumped to 3.3.In the setting of dementia/marginal oral intake.  Hydrate with IV fluids overnight. Cont to encourage oral intake, await further meeting w.p son and palliative care today afternoon to decide the course. Recent Labs  Lab 09/10/20 0200 09/11/20 0302 09/14/20 1942  BUN 38* 42* 48*  CREATININE 2.97* 3.28* 3.35*   Hyperkalemia k improved to 4.2> 5.3.  This is in the setting of his renal failure.  Status post temporizing measures with insulin dextrose Lokelma yesterday currently on oral Lokelma until further disposition.  Acute on chronic anemia in the setting of CKD status post 1 unit PRBC. Recent Labs  Lab 09/10/20 0200 09/11/20 0302  HGB 7.5* 7.2*  HCT 25.3* 24.8*   Advanced dementia/wheelchair-he is bedbound with advanced dementia.  Does not follow instructions or commands.  Cont Seroquel, delirium precaution and other supportive measures.  Essential hypertension BP stable on amlodipine hydralazine Toprol.  PAF rate controlled on  amiodarone Toprol aspirin.  Dysphagia on dysphagia 1 diet able to feed with nursing assistance.  Head movement question cervical dyskinesia could be from his previous C-spine injury also has very severe C-spine disease with fusion from C3-7.   Goals of  care/DNR DNR palliative care following comfort measures but son and daughter wanted to pursue treatment of hyperkalemia 4/26 pm until he is able to discuss with his sister and brother about further disposition return to ALF with hospice versus [facility] . Pt received temporizing measures and gentle hydration overnight.    Diet Order            DIET - DYS 1 Room service appropriate? Yes; Fluid consistency: Nectar Thick  Diet effective now                 Patient's Body mass index is 22.92 kg/m. DVT prophylaxis: NONE for comfort Code Status:   Code Status: DNR  Family Communication: plan of care discussed with palliative care and also discussed with his son extensively yesterday   Overall VERY POOR Prognosis. Status is: Inpatient Remains inpatient appropriate because:Inpatient level of care appropriate due to severity of illness and Awaiting for Pleurx catheter placement  Dispo: The patient is from: Memory care unit              Anticipated d/c is to: Hospice- TBD alf w/ hospcie .              Patient currently is not medically stable to d/c.   Difficult to place patient No   Unresulted Labs (From admission, onward)         None      Medications reviewed:  Scheduled Meds: . acetaminophen  1,000 mg Oral QHS  . albuterol  4 puff Inhalation Once  . amiodarone  200 mg Oral Daily  . amLODipine  10 mg Oral Daily  . cloNIDine  0.1 mg Oral BID  . hydrALAZINE  100 mg Oral TID  . ipratropium  2 puff Inhalation Once  . melatonin  3 mg Oral QHS  . metoprolol succinate  25 mg Oral Daily  . polyethylene glycol  17 g Oral Daily  . sodium bicarbonate  650 mg Oral BID  . sodium chloride  2 spray Each Nare Daily  . sodium zirconium cyclosilicate  10 g Oral Daily  . tamsulosin  0.4 mg Oral QHS   Continuous Infusions:   Consultants:see note  Procedures:see note  Antimicrobials: Anti-infectives (From admission, onward)   Start     Dose/Rate Route Frequency Ordered Stop    09/07/20 1000  azithromycin (ZITHROMAX) tablet 500 mg        500 mg Oral Daily 09/06/20 1156 09/07/20 0952   09/04/20 2200  cefTRIAXone (ROCEPHIN) 2 g in sodium chloride 0.9 % 100 mL IVPB        2 g 200 mL/hr over 30 Minutes Intravenous Every 24 hours 09/03/20 2312 09/09/20 2159   09/04/20 2200  azithromycin (ZITHROMAX) 500 mg in sodium chloride 0.9 % 250 mL IVPB  Status:  Discontinued        500 mg 250 mL/hr over 60 Minutes Intravenous Every 24 hours 09/03/20 2312 09/06/20 1156   09/04/20 1730  cefTRIAXone (ROCEPHIN) 1 g in sodium chloride 0.9 % 100 mL IVPB  Status:  Discontinued        1 g 200 mL/hr over 30 Minutes Intravenous Every 24 hours 09/04/20 1634 09/04/20 1643   09/04/20 1730  azithromycin (ZITHROMAX) 500 mg in  sodium chloride 0.9 % 250 mL IVPB  Status:  Discontinued        500 mg 250 mL/hr over 60 Minutes Intravenous Every 24 hours 09/04/20 1634 09/04/20 1644   09/03/20 2230  cefTRIAXone (ROCEPHIN) 1 g in sodium chloride 0.9 % 100 mL IVPB        1 g 200 mL/hr over 30 Minutes Intravenous  Once 09/03/20 2229 09/03/20 2332   09/03/20 2230  azithromycin (ZITHROMAX) 500 mg in sodium chloride 0.9 % 250 mL IVPB        500 mg 250 mL/hr over 60 Minutes Intravenous  Once 09/03/20 2229 09/04/20 0148     Culture/Microbiology    Component Value Date/Time   SDES FLUID 09/04/2020 1156   SPECREQUEST LEFT THORACENTESIS 09/04/2020 1156   CULT  09/04/2020 1156    NO GROWTH 3 DAYS Performed at [facility], [place] [address] [place], [place] [address]    REPTSTATUS 09/08/2020 FINAL 09/04/2020 1156    Other culture-see note  Objective: Vitals: Today's Vitals   09/15/20 2014 09/16/20 0030 09/16/20 0602 09/16/20 1233  BP: (!) 166/59 (!) 137/55 (!) 138/58 (!) 118/51  Pulse: 64  (!) 50 (!) 53  Resp:    18  Temp: 98.3 F (36.8 C)  97.9 F (36.6 C) 98 F (36.7 C)  TempSrc: Oral  Oral   SpO2: 96%  100% 97%  Weight:      Height:      PainSc: 2        Intake/Output Summary  (Last 24 hours) at 09/16/2020 1332 Last data filed at 09/16/2020 0454 Gross per 24 hour  Intake 863.3 ml  Output 600 ml  Net 263.3 ml   Filed Weights   09/04/20 0315  Weight: 81 kg   Weight change:   Intake/Output from previous day: 04/26 0701 - 04/27 0700 In: 863.3 [P.O.:240; I.V.:623.3] Out: 600 [Urine:600] Intake/Output this shift: No intake/output data recorded. Filed Weights   09/04/20 0315  Weight: 81 kg    Examination: General exam: Alert awake, demented, older than stated age, weak appearing. HEENT:Oral mucosa moist, Ear/Nose WNL grossly, dentition normal. Respiratory system: bilaterally diminished, no crackles,no use of accessory muscle Cardiovascular system: S1 & S2 +, No JVD,. Gastrointestinal system: Abdomen soft, NT,ND, BS+ Nervous System:Alert, awake, moving extremities and grossly nonfocal Extremities: no edema, distal peripheral pulses palpable.  Skin: No rashes,no icterus. MSK: Normal muscle bulk,tone, power    Data Reviewed: I have personally reviewed following labs and imaging studies CBC: Recent Labs  Lab 09/10/20 0200 09/11/20 0302  WBC 3.5* 3.6*  NEUTROABS 2.7 2.7  HGB 7.5* 7.2*  HCT 25.3* 24.8*  MCV 108.1* 109.7*  PLT 187 [id number]   Basic Metabolic Panel: Recent Labs  Lab 09/10/20 0200 09/11/20 0302 09/14/20 1942 09/15/20 1644 09/16/20 0152  NA 147* 149* 152*  --   --   K 4.8 5.0 6.0* 4.2 5.3*  CL 123* 122* 121*  --   --   CO2 19* 20* 19*  --   --   GLUCOSE 88 84 138* 108*  --   BUN 38* 42* 48*  --   --   CREATININE 2.97* 3.28* 3.35*  --   --   CALCIUM 8.1* 8.3* 8.4*  --   --    GFR: Estimated Creatinine Clearance: 15.4 mL/min (A) (by C-G formula based on SCr of 3.35 mg/dL (H)). Liver Function Tests: No results for input(s): AST, ALT, ALKPHOS, BILITOT, PROT, ALBUMIN in the last 168 hours.  No results for input(s): LIPASE, AMYLASE in the last 168 hours. No results for input(s): AMMONIA in the last 168 hours. Coagulation  Profile: No results for input(s): INR, PROTIME in the last 168 hours. Cardiac Enzymes: No results for input(s): CKTOTAL, CKMB, CKMBINDEX, TROPONINI in the last 168 hours. BNP (last 3 results) No results for input(s): PROBNP in the last 8760 hours. HbA1C: No results for input(s): HGBA1C in the last 72 hours. CBG: Recent Labs  Lab 09/15/20 1506 09/15/20 1624 09/15/20 1709 09/15/20 1804 09/15/20 2015  GLUCAP 134* 158* 104* 109* 87   Lipid Profile: No results for input(s): CHOL, HDL, LDLCALC, TRIG, CHOLHDL, LDLDIRECT in the last 72 hours. Thyroid Function Tests: No results for input(s): TSH, T4TOTAL, FREET4, T3FREE, THYROIDAB in the last 72 hours. Anemia Panel: No results for input(s): VITAMINB12, FOLATE, FERRITIN, TIBC, IRON, RETICCTPCT in the last 72 hours. Sepsis Labs: No results for input(s): PROCALCITON, LATICACIDVEN in the last 168 hours.  Recent Results (from the past 240 hour(s))  SARS CORONAVIRUS 2 (TAT 6-24 HRS) Nasopharyngeal Nasopharyngeal Swab     Status: None   Collection Time: 09/08/20  9:38 AM   Specimen: Nasopharyngeal Swab  Result Value Ref Range Status   SARS Coronavirus 2 NEGATIVE NEGATIVE Final    Comment: (NOTE) SARS-CoV-2 target nucleic acids are NOT DETECTED.  The SARS-CoV-2 RNA is generally detectable in upper and lower respiratory specimens during the acute phase of infection. Negative results do not preclude SARS-CoV-2 infection, do not rule out co-infections with other pathogens, and should not be used as the sole basis for treatment or other patient management decisions. Negative results must be combined with clinical observations, patient history, and epidemiological information. The expected result is Negative.  Fact Sheet for Patients: SugarRoll.be  Fact Sheet for Healthcare Providers: https://www.woods-mathews.com/  This test is not yet approved or cleared by the [place] FDA and  has been  authorized for detection and/or diagnosis of SARS-CoV-2 by FDA under an Emergency Use Authorization (EUA). This EUA will remain  in effect (meaning this test can be used) for the duration of the COVID-19 declaration under Se ction 564(b)(1) of the Act, 21 U.S.C. section 360bbb-3(b)(1), unless the authorization is terminated or revoked sooner.  Performed at [facility], [place] [address], [place], [place] [address]   Resp Panel by RT-PCR (Flu A&B, Covid) Nasopharyngeal Swab     Status: None   Collection Time: 09/15/20 11:12 AM   Specimen: Nasopharyngeal Swab; Nasopharyngeal(NP) swabs in vial transport medium  Result Value Ref Range Status   SARS Coronavirus 2 by RT PCR NEGATIVE NEGATIVE Final    Comment: (NOTE) SARS-CoV-2 target nucleic acids are NOT DETECTED.  The SARS-CoV-2 RNA is generally detectable in upper respiratory specimens during the acute phase of infection. The lowest concentration of SARS-CoV-2 viral copies this assay can detect is 138 copies/mL. A negative result does not preclude SARS-Cov-2 infection and should not be used as the sole basis for treatment or other patient management decisions. A negative result may occur with  improper specimen collection/handling, submission of specimen other than nasopharyngeal swab, presence of viral mutation(s) within the areas targeted by this assay, and inadequate number of viral copies(<138 copies/mL). A negative result must be combined with clinical observations, patient history, and epidemiological information. The expected result is Negative.  Fact Sheet for Patients:  EntrepreneurPulse.com.au  Fact Sheet for Healthcare Providers:  IncredibleEmployment.be  This test is no t yet approved or cleared by the [place] FDA and  has  been authorized for detection and/or diagnosis of SARS-CoV-2 by FDA under an Emergency Use Authorization (EUA). This EUA will remain  in effect (meaning  this test can be used) for the duration of the COVID-19 declaration under Section 564(b)(1) of the Act, 21 U.S.C.section 360bbb-3(b)(1), unless the authorization is terminated  or revoked sooner.       Influenza A by PCR NEGATIVE NEGATIVE Final   Influenza B by PCR NEGATIVE NEGATIVE Final    Comment: (NOTE) The Xpert Xpress SARS-CoV-2/FLU/RSV plus assay is intended as an aid in the diagnosis of influenza from Nasopharyngeal swab specimens and should not be used as a sole basis for treatment. Nasal washings and aspirates are unacceptable for Xpert Xpress SARS-CoV-2/FLU/RSV testing.  Fact Sheet for Patients: EntrepreneurPulse.com.au  Fact Sheet for Healthcare Providers: IncredibleEmployment.be  This test is not yet approved or cleared by the [place] FDA and has been authorized for detection and/or diagnosis of SARS-CoV-2 by FDA under an Emergency Use Authorization (EUA). This EUA will remain in effect (meaning this test can be used) for the duration of the COVID-19 declaration under Section 564(b)(1) of the Act, 21 U.S.C. section 360bbb-3(b)(1), unless the authorization is terminated or revoked.  Performed at [facility], [place] [address] [place], [place] [address]      Radiology Studies: IR [facility] PLEURAL DRAIN W/INDWELL CATH W/IMG GUIDE  Result Date: 09/14/2020 INDICATION: 87 year old gentleman with CHF and recurrent left pleural effusion presents to interventional radiology for chest PleurX placement prior to admission to hospice. EXAM: Ultrasound-guided left PleurX placement. MEDICATIONS: The patient is currently admitted to the hospital and receiving intravenous antibiotics. The antibiotics were administered within an appropriate time frame prior to the initiation of the procedure. ANESTHESIA/SEDATION: Fentanyl 25 mcg IV; Versed 1.5 mg IV Moderate Sedation Time:  16 minutes The patient was continuously monitored during the  procedure by the interventional radiology nurse under my direct supervision. COMPLICATIONS: None immediate. PROCEDURE: Informed written consent was obtained from the patient's daughter, Claudine Mouton, after a thorough discussion of the procedural risks, benefits and alternatives. All questions were addressed. Maximal Sterile Barrier Technique was utilized including caps, mask, sterile gowns, sterile gloves, sterile drape, hand hygiene and skin antiseptic. A timeout was performed prior to the initiation of the procedure. Ultrasound examination demonstrated moderate amount of effusion in the left pleural space. Ultrasound image of the left pleural effusion was obtained and placed in permanent medical record. Overlying skin was prepped and draped in the usual sterile fashion. Following local lidocaine administration, the left pleural space was accessed with a sheathed needle. PleurX catheter was brought to the access site through a short subcutaneous tunnel. Peel-away sheath placed and PleurX catheter inserted through the peel-away sheath. Approximately 1,200 mL of bloody fluid was removed. The access site was closed with Dermabond. The PleurX catheter was secured at the skin entry site with non-absorbable silk suture. Sterile dressing applied over the insertion site. IMPRESSION: Left chest PleurX catheter placement Electronically Signed   By: Miachel Roux M.D.   On: 09/14/2020 16:02     LOS: 13 days   Antonieta Pert, MD [facility]  09/16/2020, 1:32 PM

## 2020-09-16 NOTE — Plan of Care (Signed)
  Problem: Education: Goal: Knowledge of General Education information will improve Description: Including pain rating scale, medication(s)/side effects and non-pharmacologic comfort measures Outcome: Progressing   Problem: Activity: Goal: Risk for activity intolerance will decrease Outcome: Progressing   Problem: Nutrition: Goal: Adequate nutrition will be maintained Outcome: Progressing   Problem: Elimination: Goal: Will not experience complications related to bowel motility Outcome: Progressing   Problem: Skin Integrity: Goal: Risk for impaired skin integrity will decrease Outcome: Progressing

## 2020-09-17 ENCOUNTER — Inpatient Hospital Stay (HOSPITAL_COMMUNITY): Payer: Medicare Other

## 2020-09-17 MED ORDER — CLONAZEPAM 0.5 MG PO TBDP: 0.5000 mg | ORAL_TABLET | Freq: Two times a day (BID) | ORAL | 0 refills | Status: AC | PRN

## 2020-09-17 MED ORDER — CLONIDINE HCL 0.1 MG PO TABS: 0.1000 mg | ORAL_TABLET | Freq: Two times a day (BID) | ORAL | Status: DC | PRN

## 2020-09-17 MED ORDER — SODIUM ZIRCONIUM CYCLOSILICATE 5 G PO PACK
5.0000 g | PACK | Freq: Every day | ORAL | Status: DC
  Administered 2020-09-17: 5 g via ORAL
  Filled 2020-09-17: qty 1

## 2020-09-17 MED ORDER — HYDRALAZINE HCL 25 MG PO TABS: 25.0000 mg | ORAL_TABLET | Freq: Four times a day (QID) | ORAL | Status: DC | PRN

## 2020-09-17 NOTE — Progress Notes (Addendum)
Manufacturing engineer [name])  Wayne Fuller has a bed to offer Wayne Fuller today.  Consents must be completed prior to him transferring to [name], and he cannot arrive until after 4 pm today. TOC manager is aware.  RN staff, please call report to [phone] at any time, bed is assigned when report is called.  Please sent pleurx supplies that are in his room to [name] via [name].  Once completed, please fax dc summary to [phone].  If son Wayne Fuller is not present in room when [facility] arrives (or another family member) please call Wayne Fuller or Wayne Fuller to make them aware that discharge has occurred.  [name] RN, BSN, [name]   **addendum 450 pm, all necessary consents are completed and transport can be arranged to [name] at this time.  Please call his family once transport arrives so they have an ETA for his arrival at [name].

## 2020-09-17 NOTE — TOC Progression Note (Addendum)
Transition of Care [facility]) - Progression Note    Patient Details  Name: Wayne Fuller MRN: [id number] Date of Birth: 08/28/1923  Transition of Care [facility]) CM/SW [place], RN Phone Number: [phone]  09/17/2020, 1:54 PM  Clinical Narrative:    Discharge summary has been faxed to [phone] per request of Hospice Nurse [name]. Awaiting notification from Hospice to know when [facility] can be called for transport.         Expected Discharge Plan and Services           Expected Discharge Date: 09/15/20                                     Social Determinants of Health (SDOH) Interventions    Readmission Risk Interventions No flowsheet data found.

## 2020-09-17 NOTE — Plan of Care (Signed)
  Problem: Education: Goal: Knowledge of disease and its progression will improve Outcome: Progressing   Problem: Health Behavior/Discharge Planning: Goal: Ability to manage health-related needs will improve Outcome: Progressing   Problem: Clinical Measurements: Goal: Complications related to the disease process or treatment will be avoided or minimized Outcome: Progressing Goal: Dialysis access will remain free of complications Outcome: Progressing   Problem: Activity: Goal: Activity intolerance will improve Outcome: Progressing   Problem: Fluid Volume: Goal: Fluid volume balance will be maintained or improved Outcome: Progressing   Problem: Nutritional: Goal: Ability to make appropriate dietary choices will improve Outcome: Progressing   Problem: Respiratory: Goal: Respiratory symptoms related to disease process will be avoided Outcome: Progressing   Problem: Self-Concept: Goal: Body image disturbance will be avoided or minimized Outcome: Progressing   Problem: Urinary Elimination: Goal: Progression of disease will be identified and treated Outcome: Progressing   Problem: Education: Goal: Knowledge of General Education information will improve Description: Including pain rating scale, medication(s)/side effects and non-pharmacologic comfort measures Outcome: Progressing   Problem: Health Behavior/Discharge Planning: Goal: Ability to manage health-related needs will improve Outcome: Progressing   Problem: Clinical Measurements: Goal: Ability to maintain clinical measurements within normal limits will improve Outcome: Progressing Goal: Will remain free from infection Outcome: Progressing Goal: Diagnostic test results will improve Outcome: Progressing Goal: Respiratory complications will improve Outcome: Progressing Goal: Cardiovascular complication will be avoided Outcome: Progressing   Problem: Activity: Goal: Risk for activity intolerance will  decrease Outcome: Progressing   Problem: Nutrition: Goal: Adequate nutrition will be maintained Outcome: Progressing   Problem: Coping: Goal: Level of anxiety will decrease Outcome: Progressing   Problem: Elimination: Goal: Will not experience complications related to bowel motility Outcome: Progressing Goal: Will not experience complications related to urinary retention Outcome: Progressing   Problem: Pain Managment: Goal: General experience of comfort will improve Outcome: Progressing   Problem: Safety: Goal: Ability to remain free from injury will improve Outcome: Progressing   Problem: Skin Integrity: Goal: Risk for impaired skin integrity will decrease Outcome: Progressing   

## 2020-09-17 NOTE — Progress Notes (Incomplete)
Palliative:  HPI: 87 y.o. male  with past medical history of CHF, s/p [employer] PPM/ICD, CAD, IDDM, CKD stage 3b, prostatomegaly admitted on 10/20/2021 with weakness x 3 days after fall followed with pain, difficulty to ambulate, decrease intake and urine output. Noted to have hypotension, leukocytosis related to discitis aspiration showing +Klebsiella along with UTI. Hospitalization complicated by cardiogenic shock EF <20%, grade 2 diastolic dysfunction, moderate pulmonary hypertension, renal failure, bladder outlet obstruction, paroxysmal atrial fibrillation. Ongoing significant fluid overload and poor response to diuresis. Trial large dose of Lasix while on dobutamine.   I met again today with Wayne Fuller along with his daughter and wife at bedside. Wayne Fuller continues to speak to his poor prognosis and is able to tell his family that he is very much at peace. He reviews that his vital signs have been good but he understands that his heart is "give out" - he says that his heart has served him well for many good years. He is a very spiritual man and reports that he had wonderful visits with chaplain Ellen. He is at peace and is prepared for transition to comfort care when his other daughter returns from [place]. Family at bedside report that they believed she would be back Thurs/Fri but she will not return until Monday July 3rd. Wayne Fuller was disheartened that she does not return until Monday but they all continue to report their goal to continue to maintain until she returns and then we will transition to full comfort care and allow natural and peaceful death.   I spoke with them again about my recommendation for deactivation of AICD and they all agree that this would be in his best interest at this time. I explained the process and will place order for [employer] to come to deactivate.   All questions/concerns addressed. Emotional support provided.   Exam: Alert, oriented. Frail.  Lying in bed. No distress. Generalized weakness and fatigue. HR 90-100s. Breathing regular, unlabored at rest. Abd soft. BLE edema.   Plan: - DNR - Deactivate AICD - Awaiting daughter's return from vacation to say goodbye and then will transition to full comfort care (she should be back Monday)  40 min  Davi Kroon, NP Palliative Medicine Team Pager [phone] (Please see [email or web address] for schedule) Team Phone [phone]    Greater than 50%  of this time was spent counseling and coordinating care related to the above assessment and plan   

## 2020-09-17 NOTE — Progress Notes (Addendum)
Patient left the Hospital via stretcher with [facility] to [facility].  Facility notified spoke with Wayne Fuller at [employer] with departurel time.  Daughter [name] was notified about patient's disposition.

## 2020-09-17 NOTE — Progress Notes (Signed)
Safety round: patient in bed asleep. Checked patient for incontinence, pt. Clean. Bed alarm on, yellow non-skid socks on, fall bracelet on, bed in lowest position, fall mats in place, call light within reach along with bedside table.   2:30pm- Safety round make by this nurse and NT. patient in bed asleep. Checked patient for incontinence, pt. Clean. Bed alarm on, yellow non-skid socks on, fall bracelet on, bed in lowest position, fall mats in place, call light within reach along with bedside table.   3:40pm- Safety round make by this nurse and another nurse. Patient in bed asleep. Checked patient for incontinence, pt. Clean. Bed alarm on, yellow non-skid socks on, fall bracelet on, bed in lowest position, fall mats in place, call light within reach along with bedside table.    5:20pm- Safety round made. Patient trying to reach over bed. Reoriented patient, asked patient if he needed anything, no verbalization made. Offered water to pt., accepted. Checked patient for incontinence, pt. Clean. Bed in low position, bed alarm on, non-skid socks on, fall risk bracelet on, fall mats in place. Silverio Lay at bedside.   6:45pm- Safety round made. Fed pt. Dinner, consumed 100% of meal. Checked for incontinence, pt. Is clean. Bed alarm on, yellow non-skid socks on, fall bracelet on, bed in lowest position, fall mats in place, call light within reach along with bedside table. Silverio Lay still at bedside.

## 2020-09-17 NOTE — Progress Notes (Signed)
Patient found on floor, assessed patient, no visble injuries were noted. MD notified. Post fall assessment completed. VSS. MD ordered imaging. Fall socks on, fall bracelet on, bed alarm on, bed in lowest position, fall mats in place. Will increase safety rounding.

## 2020-09-17 NOTE — Progress Notes (Signed)
Report called and given to Newman Nickels at [facility].

## 2020-10-21 DEATH — deceased
